# Patient Record
Sex: Male | Born: 1935 | Race: White | Hispanic: No | Marital: Married | Smoking: Former smoker
Health system: Southern US, Community
[De-identification: ages and names within clinical notes are randomized; demographics above are authoritative.]

## PROBLEM LIST (undated history)

## (undated) DIAGNOSIS — I119 Hypertensive heart disease without heart failure: Secondary | ICD-10-CM

## (undated) DIAGNOSIS — I739 Peripheral vascular disease, unspecified: Secondary | ICD-10-CM

## (undated) DIAGNOSIS — I779 Disorder of arteries and arterioles, unspecified: Secondary | ICD-10-CM

## (undated) DIAGNOSIS — R918 Other nonspecific abnormal finding of lung field: Secondary | ICD-10-CM

## (undated) DIAGNOSIS — M549 Dorsalgia, unspecified: Secondary | ICD-10-CM

## (undated) DIAGNOSIS — I2510 Atherosclerotic heart disease of native coronary artery without angina pectoris: Secondary | ICD-10-CM

## (undated) DIAGNOSIS — K219 Gastro-esophageal reflux disease without esophagitis: Secondary | ICD-10-CM

## (undated) DIAGNOSIS — G8929 Other chronic pain: Secondary | ICD-10-CM

## (undated) DIAGNOSIS — Z9289 Personal history of other medical treatment: Secondary | ICD-10-CM

## (undated) DIAGNOSIS — J189 Pneumonia, unspecified organism: Secondary | ICD-10-CM

## (undated) DIAGNOSIS — E785 Hyperlipidemia, unspecified: Secondary | ICD-10-CM

## (undated) DIAGNOSIS — I480 Paroxysmal atrial fibrillation: Secondary | ICD-10-CM

## (undated) DIAGNOSIS — I82409 Acute embolism and thrombosis of unspecified deep veins of unspecified lower extremity: Secondary | ICD-10-CM

## (undated) DIAGNOSIS — G4733 Obstructive sleep apnea (adult) (pediatric): Secondary | ICD-10-CM

## (undated) DIAGNOSIS — L97909 Non-pressure chronic ulcer of unspecified part of unspecified lower leg with unspecified severity: Secondary | ICD-10-CM

## (undated) DIAGNOSIS — F329 Major depressive disorder, single episode, unspecified: Secondary | ICD-10-CM

## (undated) DIAGNOSIS — K2100 Gastro-esophageal reflux disease with esophagitis, without bleeding: Secondary | ICD-10-CM

## (undated) DIAGNOSIS — R51 Headache: Secondary | ICD-10-CM

## (undated) DIAGNOSIS — M1990 Unspecified osteoarthritis, unspecified site: Secondary | ICD-10-CM

## (undated) DIAGNOSIS — D649 Anemia, unspecified: Secondary | ICD-10-CM

## (undated) DIAGNOSIS — F32A Depression, unspecified: Secondary | ICD-10-CM

## (undated) DIAGNOSIS — T7840XA Allergy, unspecified, initial encounter: Secondary | ICD-10-CM

## (undated) DIAGNOSIS — R519 Headache, unspecified: Secondary | ICD-10-CM

## (undated) DIAGNOSIS — I83009 Varicose veins of unspecified lower extremity with ulcer of unspecified site: Secondary | ICD-10-CM

## (undated) HISTORY — DX: Major depressive disorder, single episode, unspecified: F32.9

## (undated) HISTORY — PX: APPENDECTOMY: SHX54

## (undated) HISTORY — DX: Paroxysmal atrial fibrillation: I48.0

## (undated) HISTORY — DX: Varicose veins of unspecified lower extremity with ulcer of unspecified site: I83.009

## (undated) HISTORY — DX: Personal history of other medical treatment: Z92.89

## (undated) HISTORY — DX: Gastro-esophageal reflux disease with esophagitis: K21.0

## (undated) HISTORY — DX: Allergy, unspecified, initial encounter: T78.40XA

## (undated) HISTORY — DX: Gastro-esophageal reflux disease with esophagitis, without bleeding: K21.00

## (undated) HISTORY — DX: Peripheral vascular disease, unspecified: I73.9

## (undated) HISTORY — DX: Depression, unspecified: F32.A

## (undated) HISTORY — DX: Non-pressure chronic ulcer of unspecified part of unspecified lower leg with unspecified severity: L97.909

## (undated) HISTORY — DX: Anemia, unspecified: D64.9

## (undated) HISTORY — DX: Hypertensive heart disease without heart failure: I11.9

## (undated) HISTORY — DX: Hyperlipidemia, unspecified: E78.5

## (undated) HISTORY — PX: TONSILLECTOMY: SUR1361

## (undated) HISTORY — DX: Obstructive sleep apnea (adult) (pediatric): G47.33

## (undated) HISTORY — DX: Atherosclerotic heart disease of native coronary artery without angina pectoris: I25.10

## (undated) HISTORY — DX: Disorder of arteries and arterioles, unspecified: I77.9

## (undated) HISTORY — DX: Unspecified osteoarthritis, unspecified site: M19.90

## (undated) HISTORY — DX: Other nonspecific abnormal finding of lung field: R91.8

## (undated) HISTORY — DX: Gastro-esophageal reflux disease without esophagitis: K21.9

---

## 1979-05-25 HISTORY — PX: SHOULDER OPEN ROTATOR CUFF REPAIR: SHX2407

## 1998-04-05 ENCOUNTER — Inpatient Hospital Stay (HOSPITAL_COMMUNITY): Admission: RE | Admit: 1998-04-05 | Discharge: 1998-04-07 | Payer: Self-pay | Admitting: Nephrology

## 1999-05-05 ENCOUNTER — Ambulatory Visit: Admission: RE | Admit: 1999-05-05 | Discharge: 1999-05-05 | Payer: Self-pay | Admitting: Family Medicine

## 1999-05-05 ENCOUNTER — Encounter: Payer: Self-pay | Admitting: Internal Medicine

## 1999-08-28 ENCOUNTER — Ambulatory Visit: Admission: RE | Admit: 1999-08-28 | Discharge: 1999-08-28 | Payer: Self-pay | Admitting: Internal Medicine

## 2002-09-23 DIAGNOSIS — I739 Peripheral vascular disease, unspecified: Secondary | ICD-10-CM

## 2002-09-23 HISTORY — DX: Peripheral vascular disease, unspecified: I73.9

## 2002-10-11 ENCOUNTER — Encounter: Admission: RE | Admit: 2002-10-11 | Discharge: 2002-10-11 | Payer: Self-pay | Admitting: Orthopedic Surgery

## 2002-10-11 ENCOUNTER — Encounter: Payer: Self-pay | Admitting: Orthopedic Surgery

## 2002-10-12 ENCOUNTER — Ambulatory Visit (HOSPITAL_BASED_OUTPATIENT_CLINIC_OR_DEPARTMENT_OTHER): Admission: RE | Admit: 2002-10-12 | Discharge: 2002-10-13 | Payer: Self-pay | Admitting: Orthopedic Surgery

## 2002-10-12 ENCOUNTER — Encounter (INDEPENDENT_AMBULATORY_CARE_PROVIDER_SITE_OTHER): Payer: Self-pay | Admitting: *Deleted

## 2003-05-16 ENCOUNTER — Ambulatory Visit (HOSPITAL_COMMUNITY): Admission: RE | Admit: 2003-05-16 | Discharge: 2003-05-17 | Payer: Self-pay | Admitting: Cardiovascular Disease

## 2004-06-12 ENCOUNTER — Inpatient Hospital Stay (HOSPITAL_COMMUNITY): Admission: AD | Admit: 2004-06-12 | Discharge: 2004-06-15 | Payer: Self-pay | Admitting: Cardiovascular Disease

## 2004-06-13 HISTORY — PX: CORONARY ANGIOPLASTY WITH STENT PLACEMENT: SHX49

## 2005-01-31 ENCOUNTER — Ambulatory Visit: Payer: Self-pay | Admitting: Internal Medicine

## 2006-06-03 ENCOUNTER — Inpatient Hospital Stay (HOSPITAL_COMMUNITY): Admission: EM | Admit: 2006-06-03 | Discharge: 2006-06-09 | Payer: Self-pay | Admitting: Cardiology

## 2006-06-03 HISTORY — PX: CORONARY ANGIOPLASTY WITH STENT PLACEMENT: SHX49

## 2006-06-06 HISTORY — PX: CORONARY ANGIOPLASTY WITH STENT PLACEMENT: SHX49

## 2006-06-30 ENCOUNTER — Ambulatory Visit: Payer: Self-pay | Admitting: Internal Medicine

## 2006-12-24 ENCOUNTER — Ambulatory Visit: Payer: Self-pay | Admitting: Vascular Surgery

## 2007-01-25 ENCOUNTER — Emergency Department (HOSPITAL_COMMUNITY): Admission: EM | Admit: 2007-01-25 | Discharge: 2007-01-25 | Payer: Self-pay | Admitting: *Deleted

## 2007-07-13 DIAGNOSIS — K210 Gastro-esophageal reflux disease with esophagitis: Secondary | ICD-10-CM

## 2007-07-13 DIAGNOSIS — J449 Chronic obstructive pulmonary disease, unspecified: Secondary | ICD-10-CM | POA: Insufficient documentation

## 2007-07-14 ENCOUNTER — Ambulatory Visit: Payer: Self-pay | Admitting: Vascular Surgery

## 2007-07-15 ENCOUNTER — Ambulatory Visit: Payer: Self-pay | Admitting: Internal Medicine

## 2007-12-29 ENCOUNTER — Ambulatory Visit: Payer: Self-pay | Admitting: Vascular Surgery

## 2008-06-28 ENCOUNTER — Ambulatory Visit: Payer: Self-pay | Admitting: Vascular Surgery

## 2008-08-10 ENCOUNTER — Encounter: Payer: Self-pay | Admitting: Internal Medicine

## 2008-08-22 ENCOUNTER — Encounter: Payer: Self-pay | Admitting: Internal Medicine

## 2008-09-04 ENCOUNTER — Encounter: Payer: Self-pay | Admitting: Internal Medicine

## 2008-09-19 ENCOUNTER — Inpatient Hospital Stay (HOSPITAL_COMMUNITY): Admission: EM | Admit: 2008-09-19 | Discharge: 2008-09-24 | Payer: Self-pay | Admitting: Emergency Medicine

## 2008-09-20 HISTORY — PX: CORONARY ANGIOPLASTY WITH STENT PLACEMENT: SHX49

## 2008-10-01 ENCOUNTER — Inpatient Hospital Stay (HOSPITAL_COMMUNITY): Admission: EM | Admit: 2008-10-01 | Discharge: 2008-10-06 | Payer: Self-pay | Admitting: Emergency Medicine

## 2008-10-03 HISTORY — PX: CARDIAC CATHETERIZATION: SHX172

## 2009-07-21 ENCOUNTER — Encounter: Payer: Self-pay | Admitting: Internal Medicine

## 2009-08-24 ENCOUNTER — Ambulatory Visit: Payer: Self-pay | Admitting: Internal Medicine

## 2009-08-24 DIAGNOSIS — J984 Other disorders of lung: Secondary | ICD-10-CM | POA: Insufficient documentation

## 2009-08-24 DIAGNOSIS — G4733 Obstructive sleep apnea (adult) (pediatric): Secondary | ICD-10-CM

## 2009-09-04 ENCOUNTER — Telehealth: Payer: Self-pay | Admitting: Internal Medicine

## 2010-05-07 ENCOUNTER — Inpatient Hospital Stay (HOSPITAL_COMMUNITY): Admission: AD | Admit: 2010-05-07 | Discharge: 2010-05-08 | Payer: Self-pay | Admitting: Cardiovascular Disease

## 2010-08-28 ENCOUNTER — Encounter (HOSPITAL_BASED_OUTPATIENT_CLINIC_OR_DEPARTMENT_OTHER)
Admission: RE | Admit: 2010-08-28 | Discharge: 2010-10-23 | Payer: Self-pay | Source: Home / Self Care | Attending: Internal Medicine | Admitting: Internal Medicine

## 2010-09-13 ENCOUNTER — Ambulatory Visit: Payer: Self-pay | Admitting: Internal Medicine

## 2010-09-21 ENCOUNTER — Telehealth (INDEPENDENT_AMBULATORY_CARE_PROVIDER_SITE_OTHER): Payer: Self-pay | Admitting: *Deleted

## 2010-10-18 ENCOUNTER — Encounter: Payer: Self-pay | Admitting: Internal Medicine

## 2010-10-25 NOTE — Assessment & Plan Note (Signed)
Summary: 1 yr follow-up//jrc   Primary Provider/Referring Provider:  Lucila Maine  CC:  1 year f/u appt.  wears cpap machine every night.  c/o waking up in the middle of night and has trouble falling back asleep.  also c/o dry throat in the am. denies sob. Gavin [name]  History of Present Illness: August 24, 2009- OSA, COPD, GERD  ........................................Gavin Kitchenwife here OSA- [facility]- he thinks pressure is on 9, our record says 10. He has nasal pillows mask, new machine. He and wife think he sleeps ok with little snore-through. He naps aftrer cardiac rehab without interfering somnlence or difficulty sleeping at night. Has lost some weight Had flu shot. He denies significant change overall in his breathing. This is reinforced by his rehab exercise. Voice has gotten softer. Last CXR 2 mos ago said"ok".He tells me now he is being followed with CXR at [facility] for a lung nodule.  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  wears cpap machine every night.  c/o waking up in the middle of night and has trouble falling back asleep.  also c/o dry throat in the am. denies sob.  CPAP is at 9. This mask allows some lip flutter with nasal pillows mask. He has been using it every night and admits he sleeps better with it. Original NPSG in 2000 had shown severe OSA with RDI 65/hr. Lung nodules f/u w/ CT at [facility]- stable nodules w/ 1 more annual f/u recommended.      Preventive Screening-Counseling & Management  Alcohol-Tobacco     Smoking Status: quit  Problems Prior to Update: 1)  Obstructive Sleep Apnea  (ICD-327.23) 2)  Lung Nodule  (ICD-518.89) 3)  COPD  (ICD-496) 4)  Cad  (ICD-414.00) 5)  Esophagitis, Reflux  (ICD-530.11)  Medications Prior to Update: 1)  Prilosec 20 Mg  Cpdr (Omeprazole) .... One By Mouth Once Daily 2)  Toprol Xl 100 Mg  Tb24 (Metoprolol Succinate) .... One Q Am 3)  Vasotec 20 Mg  Tabs (Enalapril Maleate) .... One Qd 4)   Lipitor 40 Mg  Tabs (Atorvastatin Calcium) .... One Once Daily 5)  Nasonex 50 Mcg/act  Susp (Mometasone Furoate) 6)  Adult Aspirin Ec Low Strength 81 Mg  Tbec (Aspirin) 7)  Flomax 0.4 Mg  Cp24 (Tamsulosin Hcl) 8)  Spironolactone 25 Mg  Tabs (Spironolactone) .... 1/2 Qd 9)  Cpap  9 Cwp Am Home Pt 10)  Coumadin 5 Mg Tabs (Warfarin Sodium) .... Take As Directed 11)  Imdur 30 Mg Xr24h-Tab (Isosorbide Mononitrate) .... Take 2 By Mouth Once Daily 12)  Ocuvite Preservision  Tabs (Multiple Vitamins-Minerals) .... Take 2 By Mouth Once Daily  Current Medications (verified): 1)  Protonix 40 Mg Tbec (Pantoprazole Sodium) .... Take 1 Tablet By Mouth Once A Day 2)  Toprol Xl 100 Mg  Tb24 (Metoprolol Succinate) .... Take 1 1/2 Tabs By Mouth Daily 3)  Vasotec 20 Mg  Tabs (Enalapril Maleate) .... One Qd 4)  Lipitor 40 Mg  Tabs (Atorvastatin Calcium) .... One Once Daily 5)  Nasonex 50 Mcg/act  Susp (Mometasone Furoate) .... At Bedtime 6)  Adult Aspirin Ec Low Strength 81 Mg  Tbec (Aspirin) 7)  Cpap  9 Cwp Am Home Pt 8)  Coumadin 5 Mg Tabs (Warfarin Sodium) .... Take As Directed 9)  Imdur 30 Mg Xr24h-Tab (Isosorbide Mononitrate) .... Take 1 Tablet By Mouth Two Times A Day 10)  Ocuvite Preservision  Tabs (Multiple Vitamins-Minerals) .... Take 2  By Mouth Once Daily 11)  Clonidine Hcl 0.1 Mg Tabs (Clonidine Hcl) .... Take 1 Tablet By Mouth Two Times A Day 12)  Furosemide 40 Mg Tabs (Furosemide) .... Take 1 Tablet By Mouth Once A Day 13)  Norvasc 5 Mg Tabs (Amlodipine Besylate) .... Take 1 Tablet By Mouth Once A Day 14)  Triamcinolone Acetonide 0.5 % Crea (Triamcinolone Acetonide) .... Use Two Times A Day 15)  Flexeril 10 Mg Tabs (Cyclobenzaprine Hcl) .... As Needed 16)  Clorazepate Dipotassium 7.5 Mg Tabs (Clorazepate Dipotassium) .... As Needed 17)  Benefiber  Powd (Wheat Dextrin) .[name]. 1 Tsp Two Times A Day 18)  Cetaphil  Crea (Emollient) .... Daily 19)  Claritin 10 Mg Tabs (Loratadine) .... Take 1 Tablet By  Mouth Once A Day 20)  Ibuprofen 400 Mg Tabs (Ibuprofen) .... Take 1 Tablet By Mouth Two Times A Day 21)  Miralax  Powd (Polyethylene Glycol 3350) .... Once Daily 22)  Botox Injection .... Every 3 Months For Migraines 23)  Metolazone 2.5 Mg Tabs (Metolazone) .... 1/2 Tab By Mouth Twice A Week 24)  Klor-Con 10 10 Meq Cr-Tabs (Potassium Chloride) .... Take 1 Tab Along With The Metolazone 25)  Cephalexin 500 Mg Caps (Cephalexin) .... Take 1 Tablet By Mouth Three Times A Day  Allergies (verified): 1)  ! Cipro 2)  ! Codeine 3)  ! * Micardis 4)  ! Sulfa  Past History:  Past Surgical History: Last updated: 12-Sep-2009 Right rotagtor cuff Appendectomy Tonsillectomy Cardiac stents x 6  Family History: Last updated: 2009-09-12 Father- died MI Mother- died CHF  Social History: Last updated: 09-12-2009 Married Patient states former smoker. 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    Vital Signs:  Patient profile:   77 year old male Height:      67 inches Weight:      211.50 pounds BMI:     33.25 O2 Sat:      98 % on Room air Pulse rate:   57 / minute BP sitting:   140 / 70  (left arm) Cuff size:   regular  Vitals Entered By: Arman Filter LPN (September 13, 2010 2:22 PM)  O2 Flow:  Room air CC: 1 year f/u appt.  wears cpap machine every night.  c/o waking up in the middle of night and has trouble  falling back asleep.  also c/o dry throat in the am. denies sob.  Comments Medications reviewed with patient Arman Filter LPN  September 13, 2010 2:38 PM    Physical Exam  Additional Exam:  General: A/Ox3; pleasant and cooperative, NAD, well appearing SKIN: no rash, lesions NODES: no lymphadenopathy HEENT: [place]/AT, EOM- WNL, Conjuctivae- clear, PERRLA, TM-hearing aids, Nose- clear, Throat- clear and wnl,  Mallampati  III NECK: Supple w/ fair ROM, JVD- none, normal carotid impulses w/o bruits Thyroid- normal to palpation CHEST: Clear to P&A HEART: RRR, no m/g/r heard ABDOMEN: Soft and nl; NWG:NFAO, nl pulses, no edema  NEURO: Grossly intact to observation  Impression & Recommendations:  Problem # 1:  OBSTRUCTIVE SLEEP APNEA (ICD-327.23)  We will try reducing his CPAP to 8, let him increase his humidifier setting. For the waking after sleep onset he can try sonata.   Problem # 2:  LUNG NODULE (ICD-518.89) Discussed result of CT done in [place].  Dr Lorin Picket is following but patient says  "nothing there". He is probably right that these are benign nodules, but I will let Dr Lorin Picket discuss follow-up recommendations from Radiology again with Mr Kinslow.   Medications Added to Medication List This Visit: 1)  Protonix 40 Mg Tbec (Pantoprazole sodium) .... Take 1 tablet by mouth once a day 2)  Toprol Xl 100 Mg Tb24 (Metoprolol succinate) .... Take 1 1/2 tabs by mouth daily 3)  Nasonex 50 Mcg/act Susp (Mometasone furoate) .... At bedtime 4)  Cpap 8 Cwp Am Home Pt  5)  Imdur 30 Mg Xr24h-tab (Isosorbide mononitrate) .... Take 1 tablet by mouth two times a day 6)  Clonidine Hcl 0.1 Mg Tabs (Clonidine hcl) .... Take 1 tablet by mouth two times a day 7)  Furosemide 40 Mg Tabs (Furosemide) .... Take 1 tablet by mouth once a day 8)  Norvasc 5 Mg Tabs (Amlodipine besylate) .... Take 1 tablet by mouth once a day 9)  Triamcinolone Acetonide 0.5 % Crea (Triamcinolone acetonide) .... Use two times  a day 10)  Flexeril 10 Mg Tabs (Cyclobenzaprine hcl) .... As needed 11)  Clorazepate Dipotassium 7.5 Mg Tabs (Clorazepate dipotassium) .... As needed 12)  Benefiber Powd (Wheat dextrin) .[name]. 1 tsp two times a day 13)  Cetaphil Crea (Emollient) .... Daily 14)  Claritin 10 Mg Tabs (Loratadine) .... Take 1 tablet by mouth once a day 15)  Ibuprofen 400 Mg Tabs (Ibuprofen) .... Take 1 tablet by mouth two times a day 16)  Miralax Powd (Polyethylene glycol 3350) .... Once daily 17)  Botox Injection  .... Every 3 months for migraines 18)  Metolazone 2.5 Mg Tabs (Metolazone) .... 1/2 tab by mouth twice a week 19)  Klor-con 10 10 Meq Cr-tabs (Potassium chloride) .... Take 1 tab along with the metolazone 20)  Cephalexin 500 Mg Caps (Cephalexin) .... Take 1 tablet by mouth three times a day  Other Orders: Est. Patient Level III (29562) DME Referral (DME)  Patient Instructions: 1)  Please schedule a follow-up appointment in 1 year. 2)  We will contact [facility] to reduce CPAP to 8. 3)  Try turning the heater/ humidifier up to increase moisture in the air and reduce your dry  mouth.   Immunization History:  Influenza Immunization History:    Influenza:  historical (06/23/2010)

## 2010-10-25 NOTE — Progress Notes (Signed)
Summary: Prescription and order for CPAP  Phone Note Call from Patient Call back at Home Phone [phone] Call back at [phone]   Caller: Patient Summary of Call: Patient is waiting for prescription for sleep to be sent to [facility] on [address] in [place], and order to reduce CPAP to [facility].  Please call patient. Initial call taken by: Leonette Monarch,  September 21, 2010 12:46 PM  Follow-up for Phone Call        Spoke with the pt and he states he has not heard anything from [facility], so I called ans they do have the order for pressure change and they will contact the pt today.  Pt also states that CY discussed sending in a RX for sonata at the last OV but the pharmacy does not have this. Please advise. Carron Curie CMA  September 21, 2010 1:14 PM   Additional Follow-up for Phone Call Additional follow up Details #1::        I put script for zalpelon/ generic sonata on medlist Please send. Additional Follow-up by: Waymon Budge MD,  September 21, 2010 1:32 PM    Additional Follow-up for Phone Call Additional follow up Details #2::    sonata has been called into the pts pharmacy---[facility] for pt to make him aware and if he had any questions. Randell Loop CMA  September 21, 2010 2:41 PM   Spoke with pt and notified rx for sonata was called in, he is aware and has already started med, and states that it helps alot.  Follow-up by: Vernie Murders,  September 25, 2010 5:13 PM  New/Updated Medications: SONATA 10 MG CAPS (ZALEPLON) 1 for sleep if needed Prescriptions: SONATA 10 MG CAPS (ZALEPLON) 1 for sleep if needed  #30 x 5   Entered by:   Waymon Budge MD   Authorized by:   Pulmonary Triage   Signed by:   Waymon Budge MD on 09/21/2010   Method used:   Historical   RxID:   [id number]

## 2010-10-30 ENCOUNTER — Encounter (HOSPITAL_BASED_OUTPATIENT_CLINIC_OR_DEPARTMENT_OTHER): Payer: Medicare Other | Attending: Internal Medicine

## 2010-10-30 DIAGNOSIS — Z7901 Long term (current) use of anticoagulants: Secondary | ICD-10-CM | POA: Insufficient documentation

## 2010-10-30 DIAGNOSIS — Z7982 Long term (current) use of aspirin: Secondary | ICD-10-CM | POA: Insufficient documentation

## 2010-10-30 DIAGNOSIS — L97809 Non-pressure chronic ulcer of other part of unspecified lower leg with unspecified severity: Secondary | ICD-10-CM | POA: Insufficient documentation

## 2010-10-30 DIAGNOSIS — Z8673 Personal history of transient ischemic attack (TIA), and cerebral infarction without residual deficits: Secondary | ICD-10-CM | POA: Insufficient documentation

## 2010-10-30 DIAGNOSIS — I2510 Atherosclerotic heart disease of native coronary artery without angina pectoris: Secondary | ICD-10-CM | POA: Insufficient documentation

## 2010-10-30 DIAGNOSIS — G4733 Obstructive sleep apnea (adult) (pediatric): Secondary | ICD-10-CM | POA: Insufficient documentation

## 2010-10-30 DIAGNOSIS — I739 Peripheral vascular disease, unspecified: Secondary | ICD-10-CM | POA: Insufficient documentation

## 2010-10-30 DIAGNOSIS — I70299 Other atherosclerosis of native arteries of extremities, unspecified extremity: Secondary | ICD-10-CM | POA: Insufficient documentation

## 2010-10-30 DIAGNOSIS — I252 Old myocardial infarction: Secondary | ICD-10-CM | POA: Insufficient documentation

## 2010-10-30 DIAGNOSIS — I872 Venous insufficiency (chronic) (peripheral): Secondary | ICD-10-CM | POA: Insufficient documentation

## 2010-11-08 NOTE — Letter (Signed)
Summary: CMN for CPAP Supplies/[facility]  CMN for CPAP Supplies/[facility]   Imported By: Sherian Rein 10/30/2010 09:21:47  _____________________________________________________________________  External Attachment:    Type:   Image     Comment:   External Document

## 2010-11-27 ENCOUNTER — Encounter (HOSPITAL_BASED_OUTPATIENT_CLINIC_OR_DEPARTMENT_OTHER): Payer: Medicare Other | Attending: Internal Medicine

## 2010-11-27 DIAGNOSIS — Z7982 Long term (current) use of aspirin: Secondary | ICD-10-CM | POA: Insufficient documentation

## 2010-11-27 DIAGNOSIS — Z8673 Personal history of transient ischemic attack (TIA), and cerebral infarction without residual deficits: Secondary | ICD-10-CM | POA: Insufficient documentation

## 2010-11-27 DIAGNOSIS — I70299 Other atherosclerosis of native arteries of extremities, unspecified extremity: Secondary | ICD-10-CM | POA: Insufficient documentation

## 2010-11-27 DIAGNOSIS — I739 Peripheral vascular disease, unspecified: Secondary | ICD-10-CM | POA: Insufficient documentation

## 2010-11-27 DIAGNOSIS — G4733 Obstructive sleep apnea (adult) (pediatric): Secondary | ICD-10-CM | POA: Insufficient documentation

## 2010-11-27 DIAGNOSIS — Z7901 Long term (current) use of anticoagulants: Secondary | ICD-10-CM | POA: Insufficient documentation

## 2010-11-27 DIAGNOSIS — I2510 Atherosclerotic heart disease of native coronary artery without angina pectoris: Secondary | ICD-10-CM | POA: Insufficient documentation

## 2010-11-27 DIAGNOSIS — I252 Old myocardial infarction: Secondary | ICD-10-CM | POA: Insufficient documentation

## 2010-11-27 DIAGNOSIS — L97809 Non-pressure chronic ulcer of other part of unspecified lower leg with unspecified severity: Secondary | ICD-10-CM | POA: Insufficient documentation

## 2010-11-27 DIAGNOSIS — I872 Venous insufficiency (chronic) (peripheral): Secondary | ICD-10-CM | POA: Insufficient documentation

## 2010-12-06 LAB — BASIC METABOLIC PANEL
CO2: 26 mEq/L (ref 19–32)
Calcium: 8.5 mg/dL (ref 8.4–10.5)
Creatinine, Ser: 1.02 mg/dL (ref 0.4–1.5)
GFR calc Af Amer: >60 mL/min (ref >60)
Sodium: 142 mEq/L (ref 135–145)

## 2010-12-06 LAB — URINALYSIS, ROUTINE W REFLEX MICROSCOPIC
Bilirubin Urine: NEGATIVE
Glucose, UA: NEGATIVE mg/dL
Hgb urine dipstick: NEGATIVE
Protein, ur: NEGATIVE mg/dL
Specific Gravity, Urine: 1.01 (ref 1.005–1.030)
Urobilinogen, UA: 0.2 mg/dL (ref 0.0–1.0)

## 2010-12-06 LAB — CARDIAC PANEL(CRET KIN+CKTOT+MB+TROPI)
CK, MB: 3 ng/mL (ref 0.3–4.0)
Relative Index: 3 — ABNORMAL HIGH (ref 0.0–2.5)
Total CK: 107 U/L (ref 7–232)
Total CK: 115 U/L (ref 7–232)
Troponin I: 0.01 ng/mL (ref 0.00–0.06)

## 2010-12-06 LAB — PROTIME-INR
INR: 2.49 — ABNORMAL HIGH (ref 0.00–1.49)
Prothrombin Time: 27 seconds — ABNORMAL HIGH (ref 11.6–15.2)

## 2010-12-06 LAB — BRAIN NATRIURETIC PEPTIDE: Pro B Natriuretic peptide (BNP): 271 pg/mL — ABNORMAL HIGH (ref 0.0–100.0)

## 2010-12-18 ENCOUNTER — Other Ambulatory Visit (HOSPITAL_BASED_OUTPATIENT_CLINIC_OR_DEPARTMENT_OTHER): Payer: Self-pay | Admitting: Internal Medicine

## 2010-12-18 ENCOUNTER — Ambulatory Visit (HOSPITAL_COMMUNITY)
Admission: RE | Admit: 2010-12-18 | Discharge: 2010-12-18 | Disposition: A | Discharge status: Home / Self Care | Payer: Medicare Other | Source: Ambulatory Visit | Attending: Internal Medicine | Admitting: Internal Medicine

## 2010-12-18 DIAGNOSIS — M79671 Pain in right foot: Secondary | ICD-10-CM

## 2010-12-18 DIAGNOSIS — M19079 Primary osteoarthritis, unspecified ankle and foot: Secondary | ICD-10-CM | POA: Insufficient documentation

## 2010-12-18 DIAGNOSIS — M79609 Pain in unspecified limb: Secondary | ICD-10-CM | POA: Insufficient documentation

## 2010-12-25 ENCOUNTER — Encounter (HOSPITAL_BASED_OUTPATIENT_CLINIC_OR_DEPARTMENT_OTHER): Payer: Medicare Other | Attending: Internal Medicine

## 2010-12-25 DIAGNOSIS — I70299 Other atherosclerosis of native arteries of extremities, unspecified extremity: Secondary | ICD-10-CM | POA: Insufficient documentation

## 2010-12-25 DIAGNOSIS — Z7901 Long term (current) use of anticoagulants: Secondary | ICD-10-CM | POA: Insufficient documentation

## 2010-12-25 DIAGNOSIS — Z8673 Personal history of transient ischemic attack (TIA), and cerebral infarction without residual deficits: Secondary | ICD-10-CM | POA: Insufficient documentation

## 2010-12-25 DIAGNOSIS — I252 Old myocardial infarction: Secondary | ICD-10-CM | POA: Insufficient documentation

## 2010-12-25 DIAGNOSIS — Z7982 Long term (current) use of aspirin: Secondary | ICD-10-CM | POA: Insufficient documentation

## 2010-12-25 DIAGNOSIS — I872 Venous insufficiency (chronic) (peripheral): Secondary | ICD-10-CM | POA: Insufficient documentation

## 2010-12-25 DIAGNOSIS — G4733 Obstructive sleep apnea (adult) (pediatric): Secondary | ICD-10-CM | POA: Insufficient documentation

## 2010-12-25 DIAGNOSIS — I739 Peripheral vascular disease, unspecified: Secondary | ICD-10-CM | POA: Insufficient documentation

## 2010-12-25 DIAGNOSIS — I2510 Atherosclerotic heart disease of native coronary artery without angina pectoris: Secondary | ICD-10-CM | POA: Insufficient documentation

## 2010-12-25 DIAGNOSIS — L97809 Non-pressure chronic ulcer of other part of unspecified lower leg with unspecified severity: Secondary | ICD-10-CM | POA: Insufficient documentation

## 2011-01-07 LAB — CBC
HCT: 39.5 % (ref 39.0–52.0)
HCT: 42 % (ref 39.0–52.0)
Hemoglobin: 12.1 g/dL — ABNORMAL LOW (ref 13.0–17.0)
Hemoglobin: 12.4 g/dL — ABNORMAL LOW (ref 13.0–17.0)
Hemoglobin: 13.6 g/dL (ref 13.0–17.0)
MCHC: 32.7 g/dL (ref 30.0–36.0)
MCHC: 33 g/dL (ref 30.0–36.0)
MCHC: 33.2 g/dL (ref 30.0–36.0)
MCV: 100.8 fL — ABNORMAL HIGH (ref 78.0–100.0)
MCV: 100.9 fL — ABNORMAL HIGH (ref 78.0–100.0)
MCV: 101.3 fL — ABNORMAL HIGH (ref 78.0–100.0)
MCV: 99.5 fL (ref 78.0–100.0)
Platelets: 311 10*3/uL (ref 150–400)
Platelets: 321 10*3/uL (ref 150–400)
Platelets: 337 10*3/uL (ref 150–400)
Platelets: 382 10*3/uL (ref 150–400)
RBC: 3.62 MIL/uL — ABNORMAL LOW (ref 4.22–5.81)
RBC: 3.73 MIL/uL — ABNORMAL LOW (ref 4.22–5.81)
RBC: 4.16 MIL/uL — ABNORMAL LOW (ref 4.22–5.81)
RDW: 13.4 % (ref 11.5–15.5)
RDW: 13.5 % (ref 11.5–15.5)
RDW: 13.8 % (ref 11.5–15.5)
WBC: 6.2 10*3/uL (ref 4.0–10.5)
WBC: 6.7 10*3/uL (ref 4.0–10.5)
WBC: 7.2 10*3/uL (ref 4.0–10.5)
WBC: 7.3 10*3/uL (ref 4.0–10.5)
WBC: 7.7 10*3/uL (ref 4.0–10.5)
WBC: 8.1 10*3/uL (ref 4.0–10.5)

## 2011-01-07 LAB — PROTIME-INR
INR: 1.2 (ref 0.00–1.49)
INR: 1.2 (ref 0.00–1.49)
INR: 1.3 (ref 0.00–1.49)
INR: 1.4 (ref 0.00–1.49)
INR: 1.6 — ABNORMAL HIGH (ref 0.00–1.49)
INR: 1.6 — ABNORMAL HIGH (ref 0.00–1.49)
INR: 1.9 — ABNORMAL HIGH (ref 0.00–1.49)
INR: 2.3 — ABNORMAL HIGH (ref 0.00–1.49)
Prothrombin Time: 15.2 seconds (ref 11.6–15.2)
Prothrombin Time: 15.4 seconds — ABNORMAL HIGH (ref 11.6–15.2)
Prothrombin Time: 15.4 seconds — ABNORMAL HIGH (ref 11.6–15.2)
Prothrombin Time: 19.4 seconds — ABNORMAL HIGH (ref 11.6–15.2)
Prothrombin Time: 20.2 seconds — ABNORMAL HIGH (ref 11.6–15.2)
Prothrombin Time: 26.5 seconds — ABNORMAL HIGH (ref 11.6–15.2)

## 2011-01-07 LAB — POCT CARDIAC MARKERS
CKMB, poc: 1.5 ng/mL (ref 1.0–8.0)
Myoglobin, poc: 95 ng/mL (ref 12–200)
Troponin i, poc: 0.06 ng/mL (ref 0.00–0.09)

## 2011-01-07 LAB — POCT I-STAT, CHEM 8
BUN: 19 mg/dL (ref 6–23)
Calcium, Ion: 1.02 mmol/L — ABNORMAL LOW (ref 1.12–1.32)
Chloride: 108 mEq/L (ref 96–112)
HCT: 44 % (ref 39.0–52.0)
Potassium: 5.6 mEq/L — ABNORMAL HIGH (ref 3.5–5.1)
Sodium: 140 mEq/L (ref 135–145)

## 2011-01-07 LAB — COMPREHENSIVE METABOLIC PANEL
AST: 23 U/L (ref 0–37)
Albumin: 2.8 g/dL — ABNORMAL LOW (ref 3.5–5.2)
Calcium: 8.7 mg/dL (ref 8.4–10.5)
Creatinine, Ser: 1.47 mg/dL (ref 0.4–1.5)
GFR calc Af Amer: 56 mL/min — ABNORMAL LOW (ref >60)
Sodium: 143 mEq/L (ref 135–145)

## 2011-01-07 LAB — BASIC METABOLIC PANEL
BUN: 13 mg/dL (ref 6–23)
BUN: 17 mg/dL (ref 6–23)
CO2: 27 mEq/L (ref 19–32)
CO2: 29 mEq/L (ref 19–32)
Calcium: 8.3 mg/dL — ABNORMAL LOW (ref 8.4–10.5)
Calcium: 8.6 mg/dL (ref 8.4–10.5)
Calcium: 8.8 mg/dL (ref 8.4–10.5)
Chloride: 105 mEq/L (ref 96–112)
Chloride: 111 mEq/L (ref 96–112)
Creatinine, Ser: 1.11 mg/dL (ref 0.4–1.5)
Creatinine, Ser: 1.31 mg/dL (ref 0.4–1.5)
Creatinine, Ser: 1.36 mg/dL (ref 0.4–1.5)
GFR calc Af Amer: >60 mL/min (ref >60)
GFR calc Af Amer: >60 mL/min (ref >60)
GFR calc non Af Amer: 51 mL/min — ABNORMAL LOW (ref >60)
GFR calc non Af Amer: >60 mL/min (ref >60)
Glucose, Bld: 100 mg/dL — ABNORMAL HIGH (ref 70–99)
Glucose, Bld: 91 mg/dL (ref 70–99)
Potassium: 4.2 mEq/L (ref 3.5–5.1)
Sodium: 137 mEq/L (ref 135–145)
Sodium: 138 mEq/L (ref 135–145)

## 2011-01-07 LAB — TSH: TSH: 2.127 u[IU]/mL (ref 0.350–4.500)

## 2011-01-07 LAB — CK TOTAL AND CKMB (NOT AT ARMC): CK, MB: 2.5 ng/mL (ref 0.3–4.0)

## 2011-01-07 LAB — CARDIAC PANEL(CRET KIN+CKTOT+MB+TROPI)
CK, MB: 2.2 ng/mL (ref 0.3–4.0)
Relative Index: INVALID (ref 0.0–2.5)
Relative Index: INVALID (ref 0.0–2.5)
Total CK: 40 U/L (ref 7–232)
Total CK: 48 U/L (ref 7–232)
Troponin I: 0.17 ng/mL — ABNORMAL HIGH (ref 0.00–0.06)
Troponin I: 0.26 ng/mL — ABNORMAL HIGH (ref 0.00–0.06)

## 2011-01-07 LAB — HEMOGLOBIN A1C: Mean Plasma Glucose: 111 mg/dL

## 2011-01-07 LAB — GLUCOSE, CAPILLARY: Glucose-Capillary: 81 mg/dL (ref 70–99)

## 2011-01-07 LAB — APTT: aPTT: 41 seconds — ABNORMAL HIGH (ref 24–37)

## 2011-01-07 LAB — BRAIN NATRIURETIC PEPTIDE: Pro B Natriuretic peptide (BNP): 39 pg/mL (ref 0.0–100.0)

## 2011-01-23 ENCOUNTER — Encounter (HOSPITAL_BASED_OUTPATIENT_CLINIC_OR_DEPARTMENT_OTHER): Payer: Medicare Other | Attending: Plastic Surgery

## 2011-01-23 DIAGNOSIS — Z79899 Other long term (current) drug therapy: Secondary | ICD-10-CM | POA: Insufficient documentation

## 2011-01-23 DIAGNOSIS — L97509 Non-pressure chronic ulcer of other part of unspecified foot with unspecified severity: Secondary | ICD-10-CM | POA: Insufficient documentation

## 2011-01-23 DIAGNOSIS — Z8673 Personal history of transient ischemic attack (TIA), and cerebral infarction without residual deficits: Secondary | ICD-10-CM | POA: Insufficient documentation

## 2011-01-23 DIAGNOSIS — L97209 Non-pressure chronic ulcer of unspecified calf with unspecified severity: Secondary | ICD-10-CM | POA: Insufficient documentation

## 2011-01-23 DIAGNOSIS — I10 Essential (primary) hypertension: Secondary | ICD-10-CM | POA: Insufficient documentation

## 2011-01-23 DIAGNOSIS — Z7901 Long term (current) use of anticoagulants: Secondary | ICD-10-CM | POA: Insufficient documentation

## 2011-01-23 DIAGNOSIS — I739 Peripheral vascular disease, unspecified: Secondary | ICD-10-CM | POA: Insufficient documentation

## 2011-01-23 DIAGNOSIS — Z7982 Long term (current) use of aspirin: Secondary | ICD-10-CM | POA: Insufficient documentation

## 2011-01-23 DIAGNOSIS — I2510 Atherosclerotic heart disease of native coronary artery without angina pectoris: Secondary | ICD-10-CM | POA: Insufficient documentation

## 2011-02-05 NOTE — Assessment & Plan Note (Signed)
[facility]                             PULMONARY OFFICE NOTE   DAVINE, COBA                       MRN:          [id number]  DATE:07/15/2007                            DOB:          1929/08/27    PROBLEM LIST:  1. Obstructive sleep apnea.  2. Esophageal reflux.  3. Coronary disease/myocardial infarction.   HISTORY:  One year followup.  His wife does not notice any snoring or  apnea on his current CPAP setting of 10.  They do notice some oral  venting and flutter.  He does doze off if quiet during the day without  deliberate naps.  It does not seem to bother him any, and he is not  aware of any sleep disturbance.  He thinks he is getting enough  nighttime sleep.   MEDICATIONS:  1. Claritin 5 mg.  2. Plavix 75 mg.  3. Prilosec 20 mg.  4. Spironolactone 1/2 x 25 mg.  5. Toprol XL 100 mg.  6. Vasotec 20 mg.  7. Coumadin.  8. Flomax 0.4 mg.  9. Lipitor 40 mg.  10.Nasonex nasal spray.  11.Metronidazole 0.75% topical.  12.Cyclobenzaprine 10 mg t.i.d. p.r.n. for headache.  13.Clorazepate 7.5 mg b.i.d. p.r.n.  14.Clonidine 0.1 mg.  15.Furosemide 40 mg.   All used as p.r.n. medicines with drug sensitivities to CODEINE, SULFA,  and MICARDIS.  Drug allergy to CIPRO.   OBJECTIVE:  Weight 222 pounds, BP 146/74, pulse 68, room air saturation  99%.  Calm.  He seems comfortably awake and alert.  No tremor.  Neurologic is unremarkable to observation.  Breathing unlabored.  Nasal airways not obviously obstructed.   IMPRESSION:  Obstructive sleep apnea.  There is a daytime sleepiness  component that may be better left alone.  I would like to make sure we  have got optimum continuous positive airway pressure control.   PLAN:  We are going to try reducing continuous positive airway pressure  to 9 CWP through [facility].  I have discussed effects of  under and over treating with continuous positive airway pressure and  some treatment  options.  He and his wife, I think, are well informed and  can report appropriately, so I will reschedule return in 1 year but  earlier p.r.n.     Clinton D. Maple Hudson, MD, Tonny Bollman, FACP  Electronically Signed    CDY/MedQ  DD: 07/15/2007  DT: 07/16/2007  Job #: [id number]   cc:   Lucila Maine, MD  Nanetta Batty, M.D.

## 2011-02-05 NOTE — Procedures (Signed)
CAROTID DUPLEX EXAM   INDICATION:  Follow-up evaluation of known carotid artery disease.   HISTORY:  Diabetes:  No.  Cardiac:  MI in 2007.  History of atrial fibrillation.  Hypertension:  Yes.  Smoking:  Previous smoker.  Previous Surgery:  No.  CV History:  On 07/14/07, carotid duplex revealed 60-79% ICA stenosis  bilaterally.  Amaurosis Fugax No, Paresthesias No, Hemiparesis No                                       RIGHT             LEFT  Brachial systolic pressure:         126               118  Brachial Doppler waveforms:         Triphasic         Triphasic  Vertebral direction of flow:        Antegrade         Antegrade  DUPLEX VELOCITIES (cm/sec)  CCA peak systolic                   61                60  ECA peak systolic                   179               84  ICA peak systolic                   186               272  ICA end diastolic                   41                66  PLAQUE MORPHOLOGY:                  Calcified         Calcified  PLAQUE AMOUNT:                      Moderate          Moderate  PLAQUE LOCATION:                    Proximal ICA/ECA  Proximal ICA   IMPRESSION:  1. 40-59% right internal carotid artery stenosis (high end of range).  2. 60-79% left internal carotid artery stenosis.  3. No significant change from previous study bilaterally.   ___________________________________________  Di Kindle. Edilia Bo, M.D.   MC/MEDQ  D:  12/29/2007  T:  12/29/2007  Job:  [id number]

## 2011-02-05 NOTE — Discharge Summary (Signed)
NAME:  Gavin [name], Gavin [name] NO.:  [id number]   MEDICAL RECORD NO.:  [id number]          PATIENT TYPE:  INP   LOCATION:  2010                         FACILITY:  [facility]   PHYSICIAN:  Nicki Guadalajara, M.D.     DATE OF BIRTH:  March 01, 1929   DATE OF ADMISSION:  09/19/2008  DATE OF DISCHARGE:  09/24/2008                               DISCHARGE SUMMARY   ADDENDUM  Please refer to the discharge summary dictated on September 22, 2008.  [name] was kept an extra 48 hours because of some back pain.  We did  send him for a CT scan to make sure he did not have a retroperitoneal  bleed and this was in fact negative.  Dr. Tresa Endo used this time to adjust  his medications.  We added low-dose Imdur and increased his beta-  blocker.  His INR at discharge is 1.4.  We feel he can be discharged on  September 24, 2008.  He will follow up with Dr. Allyson Sabal and [facility]  [facility] as previously directed.      Gavin [name], P.A.    ______________________________  Nicki Guadalajara, M.D.    Gavin [name]  D:  09/24/2008  T:  09/24/2008  Job:  [id number]   cc:   Thereasa Solo. Little, M.D.

## 2011-02-05 NOTE — Procedures (Signed)
CAROTID DUPLEX EXAM   INDICATION:  Follow up of known coronary artery disease.   HISTORY:  Diabetes:  No.  Cardiac:  MI in 2007 and atrial fibrillation.  Hypertension:  Yes.  Smoking:  No  Previous Surgery:  No.                                       RIGHT             LEFT  Brachial systolic pressure:         126               110  Brachial Doppler waveforms:         Biphasic.         Biphasic.  Vertebral direction of flow:        Antegrade.        Antegrade.  DUPLEX VELOCITIES (cm/sec)  CCA peak systolic                   86                90  ECA peak systolic                   236               114  ICA peak systolic                   240               292  ICA end diastolic                   50                71  PLAQUE MORPHOLOGY:                  Heterogenous.     Heterogenous.  PLAQUE AMOUNT:                      Moderate.         Moderate to  severe.  PLAQUE LOCATION:                    ICA/ECA           ICA/ECA   IMPRESSION:  60-79% stenosis noted in bilateral internal carotid  arteries. Antegrade bilateral vertebral arteries.   ___________________________________________  Di Kindle. Edilia Bo, M.D.   MG/MEDQ  D:  07/14/2007  T:  07/15/2007  Job:  [id number]

## 2011-02-05 NOTE — Procedures (Signed)
CAROTID DUPLEX EXAM   INDICATION:  Follow-up evaluation of known carotid artery disease.   HISTORY:  Diabetes:  No.  Cardiac:  MI in 2007, atrial fibrillation.  Hypertension:  Yes.  Smoking:  Former smoker.  Previous Surgery:  No.  CV History:  Previous duplex on 12/29/07 that revealed 40-59% right ICA  stenosis, 60-79% left ICA stenosis.  Amaurosis Fugax No, Paresthesias No, Hemiparesis No.                                       RIGHT             LEFT  Brachial systolic pressure:         146               148  Brachial Doppler waveforms:         Triphasic         Triphasic  Vertebral direction of flow:        Antegrade         Antegrade  DUPLEX VELOCITIES (cm/sec)  CCA peak systolic                   63                84  ECA peak systolic                   138               76  ICA peak systolic                   213               343  ICA end diastolic                   63                91  PLAQUE MORPHOLOGY:                  Calcified, mixed  Calcified  PLAQUE AMOUNT:                      Moderate          Moderate-to-severe  PLAQUE LOCATION:                    Proximal ICA      Proximal ICA   IMPRESSION:  1. 60-79% right internal carotid artery stenosis.  2. 60-79% left internal carotid artery stenosis (high end of range).   ___________________________________________  Di Kindle. Edilia Bo, M.D.   MC/MEDQ  D:  06/28/2008  T:  06/28/2008  Job:  [id number]

## 2011-02-05 NOTE — Assessment & Plan Note (Signed)
OFFICE VISIT   RUSS, LOOPER  DOB:  [birth year]                                       07/14/2007  ZOXWR#:[id number]   I saw Mr. Matsumoto in the office today for continued follow-up of his  carotid disease.  I have also been following him with peripheral  vascular disease.  Since I saw him last six months ago he has had no  history of stroke, TIA's, expressive or receptive aphagia, or amaurosis  fugax. He has stable claudication in both lower extremities.  He has had  some pain in his right leg at night which occurs when he lies down and  involves his entire right lower extremity.  He has had a previous  history of sciatica.  The pain seems to be relieved with ambulation.   On review of systems he has had no recent chest pain, chest pressure,  palpitations or arrhythmias.  Pulmonary, has had no bronchitis, asthma  or wheezing.   PHYSICAL EXAMINATION:  VITALS:  Blood pressure is 135/73, heart rate is  69.  He has a left carotid bruit.  LUNGS:  Clear bilaterally to auscultation.  CARDIAC EXAM:  He has a regular rate and rhythm.  ABDOMEN:  Obese and difficult to assess.  He has normal femoral pulses.  I cannot palpate popliteal or pedal pulses. However, both feet are warm  and well perfused without ischemic ulcers.  EXTREMITY:  Reveals some hyperpigmentation consistent with chronic  venous insufficiency.  He has no significant lower extremity swelling.   I reviewed his carotid duplex scan which shows bilateral 60-79% carotid  stenoses.   As he is asymptomatic I have explained we would not consider carotid  endarterectomy unless the stenosis progressed to greater than 80% or he  developed new neurologic symptoms.  I plan on seeing him back in 6  months with a follow-up duplex scan and he is due for ABI's at that time  also.  He knows to call sooner if he has any new neurologic symptoms.  He also knows to continue taking his Plavix.  With respect to his  right  leg pain I did not think that his pain was secondary to his peripheral  vascular disease as it involved his entire leg at night and he has  excellent femoral pulses.  I have encouraged him to stay as active as  possible.   Di Kindle. Edilia Bo, M.D.  Electronically Signed   CSD/MEDQ  D:  07/14/2007  T:  07/16/2007  Job:  [id number]

## 2011-02-05 NOTE — Discharge Summary (Signed)
NAME:  Gavin [name], Gavin [name] NO.:  [id number]   MEDICAL RECORD NO.:  [id number]          PATIENT TYPE:  INP   LOCATION:  2014                         FACILITY:  [facility]   PHYSICIAN:  Nanetta Batty, M.D.   DATE OF BIRTH:  1929-09-08   DATE OF ADMISSION:  10/01/2008  DATE OF DISCHARGE:  10/06/2008                               DISCHARGE SUMMARY   DISCHARGE DIAGNOSES:  1. Unstable angina, occluded left anterior descending coronary artery      stent at catheterization this admission, plans for medical therapy.  2. Ischemic cardiomyopathy with an ejection fraction of 30%.  3. History of paroxysmal atrial fibrillation, the patient is in sinus      rhythm, he is discharged on Lovenox to Coumadin crossover.  4. Treated hypertension.  5. Treated dyslipidemia.  6. History of sleep apnea, on continuous positive airway pressure.  7. History of peripheral vascular disease with bilateral carotid      disease.  8. History of benign prostatic hypertrophy.   HOSPITAL COURSE:  The patient is a 77 year old male followed by Dr.  Allyson Sabal.  He has had several interventions to the LAD.  He was recently  admitted with an SEMI and had LAD stenting by Dr. Clarene Duke a couple of  weeks ago, this was a difficult procedure.  He was sent to the ER by his  primary care doctor when he mentioned that he had been having some chest  pain.  Clear history is always difficult to elicit.  It did sound like  some of his symptoms were similar to his pre PCI symptoms.  His  troponins were elevated at 0.3.  CK-MBs were negative.  The patient was  put on heparin and his Coumadin was held.  He was set up for diagnostic  catheterization which was done on October 03, 2008, by Dr. Allyson Sabal.  RCA  is patent, left main is patent, circumflex and OMs are patent.  He has  an 80-90% first diagonal at the takeoff of the stent and then an  occluded distal portion of the stent with some right-to-left  collaterals.  Films were  reviewed by Dr. Allyson Sabal, Dr. Jacinto Halim, and Dr.  Clarene Duke.  Plan is for continued medical therapy at this time.  We feel he  can be discharged on October 06, 2008.  He will follow up with Dr. Allyson Sabal  as an outpatient.   DISCHARGE MEDICATIONS:  1. Claritin 10 mg a day.  2. Coumadin 5 mg on Monday, Wednesday, Friday, and Sunday, and 2.5 mg      on Tuesday, Thursday, and Saturday.  3. Flomax 0.4 mg a day.  4. Lipitor 40 mg a day.  5. Nasonex nasal spray at bedtime.  6. Prilosec 20 mg a day.  7. Toprol-XL 75 mg a day.  8. Vasotec 20 mg a day.  9. Calcium daily.  10.PreserVision eye drops daily.  11.Tylenol Arthritis p.r.n.  12.Lovenox 100 mg subcu q.12 for 6 doses.  13.Amlodipine 2.5 mg a day.  14.Imdur 60 mg a day.  15.Nitroglycerin sublingual p.r.n.   We stopped his Plavix.  LABORATORY STUDIES AT DISCHARGE:  White count 6.7, hemoglobin 12.2,  hematocrit 36, and platelets 272.  Sodium 144, potassium 4.5, BUN 11,  and creatinine 1.3.  His EKG shows sinus rhythm with anterior T-wave  inversion.  Abdominal ultrasound this admission showed of 4-mm  gallbladder polyp, but no other acute changes.   DISPOSITION:  The patient is discharged in stable condition.  He will  follow up with Dr. Allyson Sabal as an outpatient.  We have added aspirin 81 mg  a day as well as the above medicines.  He will have protime on Tuesday.      Abelino Derrick, P.A.      Nanetta Batty, M.D.  Electronically Signed    LKK/MEDQ  D:  10/06/2008  T:  10/07/2008  Job:  [id number]   cc:   Nanetta Batty, M.D.

## 2011-02-05 NOTE — Cardiovascular Report (Signed)
NAME:  Gavin [name], Gavin [name] NO.:  [id number]   MEDICAL RECORD NO.:  [id number]          PATIENT TYPE:  INP   LOCATION:  2905                         FACILITY:  [facility]   PHYSICIAN:  Thereasa Solo. Little, M.D. DATE OF BIRTH:  1929/05/06   DATE OF PROCEDURE:  09/20/2008  DATE OF DISCHARGE:                            CARDIAC CATHETERIZATION   INDICATIONS FOR TEST:  This 77 year old male has had chest pain for 1  week in duration.  It intensified tonight.  He went to Urgent Care in  [facility] and was sent to [facility] by what appears to be  [facility].  He arrived in the emergency room around 6:30 p.m.  I was  called for the STIMI at 7:15 p.m. and the wire across the lesion at 8:20  p.m. and the balloon time was at 8:37.   He had previous stents in his LAD and right coronary artery.  He also  has peripheral vascular disease with stents.   He was taken directly to the Cath Lab from the emergency room.  No labs  were obtained.  He was on Coumadin and when his labs returned about half  way through the case, it revealed an INR of 2.8.  He had been on  Coumadin for atrial fib and he is also on Plavix chronically for his  peripheral vascular disease and because of the stents.  His last cardiac  stent was to his RCA in 2007, it was a non DES stent.   The patient was prepped and draped in the usual sterile fashion exposing  the right groin.  Following local anesthetic with 1% Xylocaine, the  Seldinger technique was employed and a 6-French introducer sheath was  placed in the right femoral vein and a 6-French introducer sheath in the  right femoral artery.  Left and right coronary arteriography was  performed.  A complex PTI to the LAD was performed and a ventriculogram  was performed at the end of the procedure.   COMPLICATIONS:  None.   TOTAL CONTRAST:  190 mL.   EQUIPMENT:  6-French Judkins configuration diagnostic catheters.   RESULTS:  1. Hemodynamic monitoring.   His central aortic pressure was 189/87.      His left ventricular pressure was 185/1 with a left ventricular end-      diastolic pressure 24.  2. Ventriculography.  Ventriculography at the end of the procedure      revealed the apical segment to be akinetic.  The ejection fraction      was 35-40%.  The left ventricular end-diastolic pressure was      elevated at 24.   CORONARY ARTERIOGRAPHY:  1. Left main normal.  2. Circumflex.  This gave rise to single OM vessel that was free of      disease.  3. LAD.  In the LAD were overlapping stents in the proximal portion.      There was minimal irregularities just proximal to the stents and      within the stents itself.  Just distal to the last stent was an  area of subtotaled occlusion where the vessel took a 90 degree      turn.  Following this area being subtotaled, there was mild diffuse      irregularities in the LAD which then bifurcated into the twin LAD      system.  The first diagonal had ostial 40-50% narrowing.  4. Right coronary artery.  The right coronary artery had a stent in      its proximal portion that was widely patent.  There was minimal      irregularities in the distal vessel with the PDA and posterolateral      vessel being free of disease.   A complex intervention was then undertaken to the subtotaled mid LAD.  A  6-French JL-3.5 guide catheter was used and a short Luge wire.  The wire  was easily placed down the LAD and well past the lesion.  Attempts at  placing the balloon, however were difficult and I had to change from a  JL-4 to a JL-3.5.  Once appropriate backup was obtained, I was able to  pass a 2.0 x 15 Apex balloon into the area of obstruction.  A single  inflation was performed.  Interestingly, the vessel had been subtotaled  with my nondiagnostic shocks but when the Angiomax was on board, my next  angiogram showed the vessel completely occluded.  Once the wire passed,  blood flow was immediately  reestablished and angioplasty was performed  and never closed again.   Once I had angioplastied this area, I tried to place a 2.5 x 12 Micro-  Driver stent.  The stent would not pass into the proximal stents.  There  was no obvious lesion here.  I used both a Whisper and a Luge wire as  buddy wires in an attempt to pass the stent but I could not get the  stent to pass.   I finally angioplastied the proximal stent with a 2.5 x 15 Apex balloon  despite there not being an obvious lesion.  With this, I was then able  to pass the stent into the preexisting stents and finally across the  area of subtotaled obstruction.  Once it was in place, I overlapped the  stents and deployed a 2.5 x 12 MicroDriver at 10 atmospheres for 37  seconds with a final inflation being 11 atmospheres for 38 seconds.  I  postdilated the overlapped segment only with a 2.5 x 8 [place] Voyager at 12  atmospheres for 41 seconds.  My big concern was that I may perforate the  vessel because of the abrupt turn.  This did not happen and there was an  excellent result.  Following stent placement, the vessel appeared to be  normal.  The area that been totaled and then subtotaled with only TIMI I  or TIMI zero flow now appeared to be completely normal with brisk TIMI  III flow.  There is as pointed out earlier mild diffuse irregularities  in the ongoing LAD as it bifurcates into a twin LAD system.   I discussed with the pharmacy the anticoagulation issues.  One concern  with the INR of 2.8 and femoral sheath is whether or not I could safely  give him vitamin K versus fresh frozen plasma.  Recommendations from  pharmacy were to give him IV vitamin K which I have ordered 2 mg IV with  plans to repeat an INR in the morning.  If he is still well  anticoagulated, tomorrow I would  use a fresh frozen plasma.  The  Angiomax was terminated following the intervention and unfortunately  there was a late entry from the emergency room for he  had received 5000  units of IV heparin.  We had checked previously and there was no mention  of any pre-treatment with anticoagulants.   The patient is stable at this time having only minimal residual chest  awareness.  Hopefully, the sheaths could be removed tomorrow.   His blood pressure had been markedly elevated.  He is on IV  nitroglycerin for this and we will restart his clonidine.           ______________________________  Thereasa Solo Little, M.D.     ABL/MEDQ  D:  09/19/2008  T:  09/20/2008  Job:  [id number]   cc:   Nanetta Batty, M.D.  Dr. Lorin Picket  Cath Laboratory

## 2011-02-05 NOTE — Discharge Summary (Signed)
NAME:  Gavin [name], Gavin [name] NO.:  [id number]   MEDICAL RECORD NO.:  [id number]          PATIENT TYPE:  INP   LOCATION:  2905                         FACILITY:  [facility]   PHYSICIAN:  Thereasa Solo. Little, M.D. DATE OF BIRTH:  26-Mar-1929   DATE OF ADMISSION:  09/19/2008  DATE OF DISCHARGE:  09/22/2008                               DISCHARGE SUMMARY   DISCHARGE DIAGNOSES:  1. Acute ST elevation anterior myocardial infarction.  2. Coronary artery disease, now with new stenosis just distal to      previous stent in the left anterior descending of subtotaled      occlusion.  At this site, the vessel took a 90-degree turn and      following that area being subtotaled,  there was mild diffuse      irregularities in the left anterior descending which then      bifurcated into the twin left anterior descending system.      a.     Complex intervention, very difficult due to the 90-degree       turn of the vessel and stent deployment with a Driver stent was       placed.  3. Known coronary artery disease with previous acute myocardial      infarctions in September 2005 with left anterior descending Cypher      stent and distal left anterior descending angioplasty and second      diagonal angioplasty and then in September 2007, another acute      anterior myocardial infarction with stent to the left anterior      descending and then proximal and ostial right coronary artery      stents were placed as well and an angioplasty of a jailed ostial of      the diagonal.  4. Paroxysmal atrial fibrillation, maintained sinus rhythm on      Coumadin.  5. Hypertension.  6. Hyperlipidemia.  7. Obstructive sleep apnea on CPAP.  8. Peripheral arterial disease with bilateral superficial femoral      artery stenting in 2004 and moderate-to-moderately severe left      internal carotid artery stenosis by Doppler.  9. Benign prostatic hyperplasia.  10.Chronic back pain.  11.Recent bronchitis treated  for over 15 days with antibiotics.  12.Abnormal MCV, awaiting B12 level at discharge.  13.Elevated WBC with negative UA and stable PA and lateral chest x-      ray.  14.Mild systolic heart failure postprocedure, treated with Lasix and      resolved.   DISCHARGE CONDITION:  Improved.   PROCEDURES:  1. Emergent cardiac catheterization September 20, 2008 by Dr. Julieanne Manson.  2. September 20, 2008, percutaneous transluminal coronary angioplasty      and stent deployment to the left anterior descending at 90-degree      turn distal to previously placed stent, quite difficult procedure.   Please note the patient was brought from [facility] by EMS.  We  never received any records from EMS or [place] and we were told  initially the patient did not receive  heparin or aspirin in the  emergency room, but the patient was on Plavix and Coumadin anyway as an  outpatient.  We held anticoagulation until we could get a INR back, so  the patient would not have severe side effects of bleeding and the  patient was started on Angiomax with his procedure.  Once we got the ER  sheet, it was noted he was given 324 mg of aspirin in the emergency room  and heparin 4000 units.  A drip was never started, but we assumed that  this is in fact correct as the patient's PTT was 70.  Please note  initial INR was also 2.8.   HISTORY OF PRESENT ILLNESS:  A 77 year old white married male patient of  Dr. Hazle Coca with history of coronary artery disease with anterior MI in  2005 and anterior MI in 2007 with previous procedures as previously  stated, developed chest pain on September 12, 2008 that would come and go  episodically.  He did not have any on Sunday or Monday morning, but on  September 19, 2008 on around 1:30 to 4 p.m., he developed chest pain.  He  went to Urgent Care in [place] and was given nitroglycerin sublingual.  He had not been given aspirin there.  He had relief and they transferred  him  to [facility].  At [facility] in the emergency room, STEMI was called with ST  elevation in his anterior leads.  He was hypertensive.  He was in sinus  rhythm.  He had no pain.   Dr. Clarene Duke underwent cardiac catheterization.  We proceeded with the  cath despite not knowing anticoagulation status with Coumadin.   The patient denied any associated symptoms with his chest pain, no  shortness of breath, diaphoresis, nausea, or vomiting.   PAST MEDICAL HISTORY:  See problem list.  He additionally had remote  rotator cuff repair.  He had also been off Lipitor for some time for  awhile due to muscle aches, but he had been back on it.   ALLERGIES:  SULFA, CIPRO, MICARDIS, and CODEINE.   FAMILY HISTORY:  See H and P.   SOCIAL HISTORY:  See H and P.   REVIEW OF SYSTEMS:  See H and P.   PHYSICAL EXAMINATION AT DISCHARGE:  VITAL SIGNS:  Blood pressure 123/57,  pulse 89, respirations had not been placed in the computer initially but  18 respirations, temperature 98.6, oxygen saturation 100% on room air.  He complained on the morning of discharge with back pain.  He had  reached for his urinal during the night and started having muscle spasm.  NEURO:  Alert and oriented x3.  HEART:  Regular rate and rhythm without murmur.  LUNGS:  Clear to auscultation bilaterally.  ABDOMEN:  Soft, nontender.  EXTREMITIES:  No edema.  Cath site was stable.  He did have right low  back tenderness.   At discharge, his white count was slightly elevated.  His UA has been  negative.  We had PA and lateral chest x-ray done prior to discharge.  Cardiac Rehab had seen the patient and had ambulated.  He did quite well  except for his arthritis in his hip.   LABORATORY DATA ON ADMISSION:  Hemoglobin 14.3, hematocrit 42.4, WBC  8.5, platelets 337, MCV 99.7 and neutrophils 67.  MCV at discharge was  101.2, white count was 11.9, but hemoglobin and hematocrit remained  stable as well as his platelets.  Chemistry on admission,  sodium 139,  potassium  4.2, chloride 107, CO2 24, BUN 17, creatinine 1.09 and glucose  95.  These remained stable.  Prior to discharge, potassium was up to 5.  We did hold or discontinued his spironolactone due to that.   PT on admission was 31.4 and this was in the cath lab several minutes  into the procedure, INR was 2.8 and PTT was 72.  INR gradually drifted  down.  We did give the patient vitamin K after the procedure and  secondary to all of the anticoagulation had given in a 77 year old male.  LFTs were normal.  AST 37, ALT 19, alkaline phos 62, total bili 0.7,  albumin 3.3.   Please note at discharge, protime 14.8, INR 1.1.   On cardiac markers; initial CK-MB was 339 with an MB of 14.3, and  troponin of 2.65, troponin peaked at 8.13 with a CK of 263 and an MB of  12.3.  Troponin and CK-MB came down.  His EKG still looked with some ST  elevation.  We repeated troponin I on the September 21, 2008 and CK-MBs  and they continued to drop, troponin 1.94, CK 111, MB 5.3.   Calcium ranged 8.6 to 8.3, magnesium 2.4.  UA was clear and BNP on  admission was 466, TSH 1.325.  BNP by the next morning was 744, he was  given Lasix and BNP came down to 393.  Prior to discharge, BNP was 362  and as stated, the B12 and folate are pending at discharge.   Chest x-ray on admission; no acute cardiopulmonary findings.  He did  have mild asymmetric elevation of the left hemidiaphragm.  Followup on  the September 20, 2008, linear atelectasis of the left lung base and the  right lung was clear.  He was given IV Lasix, he diuresed, his BNP  reduced.  PA and lateral chest x-ray done prior to discharge on September 22, 2008.  No acute infiltrate or edema, mild degenerative changes in  the lower thoracic spine, mild elevation of the left hemidiaphragm.  There was no pulmonary edema, no acute infiltrate or pleural effusion.   EKGs at 6:03 p.m. did have ST elevations in his anterior as well as  lateral leads.   Follow up at 18:42, the lateral leads actually looked a  little more improved and continued elevation in his V2, V3 and these  were new from Jan 25, 2007.   Postprocedure on September 20, 2008, minimal ST elevations in V2 and V3.  He has some evolutionary changes with T-wave being biphasic in his  lateral leads.  Later in the day on September 20, 2008, V2 and V3  continue with some anterior elevations but his V4, V5 and V6 leads were  normal and then on September 21, 2008 continued to be no changes and at  the time of discharge, again no changes.   HOSPITAL COURSE:  The patient was admitted by Dr. Clarene Duke with ST  elevation MI on September 19, 2008.  He had come by ambulance from urgent  care.  The nurse relayed the patient had not received any medications in  the ER, but once the patient had been started with the case, the ER  sheets came up and heparin and aspirin were documented.  The patient was  already on Plavix and Coumadin and when we got the lab work back, INR  was 2.8.  The PTT was elevated at 77, which we assumed was secondary to  the heparin bolus, though no drip  was started and the patient had  received aspirin in the emergency room.  He had already been started on  Angiomax at this point in the cath lab.  Dr. Clarene Duke proceeded  intervening on the patient's close vessel with good results.  The  patient stabilized.  He had no bleeding or hematoma.  He was given  vitamin K IV to ensure that he did not have further bleeding issues.  He  continued his Angiomax and by the next morning, aspirin was started on  the patient as well as he continued his Plavix and by September 21, 2008,  Coumadin was started for his paroxysmal AFib and though he is  maintaining sinus rhythm.  By September 22, 2008, he complained of some  back pain as he reached to get the urinal and he had some muscle spasms,  but no chest pain.  He was able to ambulate with cardiac rehab without  problems except for his  arthritic pains.  He will be discharged around 3  p.m. if he is stable from the back spasm portion as well as no chest  pain.  The patient is also afebrile.  He will follow up with Dr. Allyson Sabal  as previously instructed.  Dr. Clarene Duke saw him on the day of admission.  The patient will continue Coumadin and have INR evaluated on Monday,  September 27, 2007.   DISCHARGE MEDICATIONS:  1. Claritin 5 mg daily.  2. Coumadin 5 mg daily except 2.5 mg Tuesday, Thursdays, and      Saturdays.  3. Flomax 0.4 mg daily.  4. Lipitor 40 daily.  5. Nasonex nasal spray daily.  6. Plavix 75 mg once daily, do not stop.  7. Prilosec 20 mg daily.  8. Stop spironolactone secondary to elevated potassium.  9. Toprol-XL 100 mg every evening.  10.Vasotec 20 mg daily.  11.Continue fish oil at home 1 g daily secondary to his HDL being 34.  12.Citrucel fiber as needed.  13.Aspirin 81 mg daily until INR is therapeutic and then may      discontinue.  14.Tylenol Arthritis 650 mg as needed.  15.PreserVision eye tablets one daily, these are eye vitamins.  16.Calcium 600 daily.  17.Cetaphil lotion to feet daily.  18.He can continue the Lasix 40 mg as needed at home daily p.r.n.,      clonidine 0.1 mg as needed once a day, and clorazepate 7.5 mg as      needed 2 times a day, and cyclobenzaprine 10 mg as needed 3 times a      day and the cyclobenzaprine he has at home, also nitroglycerin      sublingually as needed for chest pain.      Darcella Gasman. Ingold, N.P.    ______________________________  Thereasa Solo Little, M.D.    LRI/MEDQ  D:  09/22/2008  T:  09/23/2008  Job:  [id number]   cc:   Nanetta Batty, M.D.  Lucila Maine, M.D.

## 2011-02-05 NOTE — Assessment & Plan Note (Signed)
OFFICE VISIT   MONTRAVIOUS, WEIGELT  DOB:  [birth year]                                       12/29/2007  EAVWU#:[id number]   I saw the patient in the office today for continued followup of his  carotid disease and peripheral vascular disease.  Since I last saw him  in October 2008, he has had no history of stroke, TIA, expressive or  receptive aphasia, or amaurosis fugax.  He does have some mild lower  extremity claudication, but no rest pain or history of non-healing  ulcers.  He also has some hip pain likely related to bursitis in his  right hip.   REVIEW OF SYSTEMS:  He has had no recent chest pain, chest pressure,  palpitations, or arrhythmias.  He has had no bronchitis, asthma, or  wheezing.   PAST MEDICAL HISTORY:  Significant for hypertension and history of  atrial fibrillation in the past.  He has no history of diabetes.  He had  a myocardial infarction in September 2005.  He has no history of  congestive heart failure.   FAMILY HISTORY:  There is no history of premature cardiovascular  disease.   SOCIAL HISTORY:  He is married.  He has 3 children.  He works as a part-  Science writer.  He quit tobacco in 1977.   MEDICATIONS:  1. Claritin 5 mg p.o. q. a.m.  2. Coumadin 5 mg Monday, Wednesday, Friday, and Sunday.  He takes 2.5      mg on Tuesday, Thursday, and Saturday.  3. Flomax 0.4 mg p.o. nightly.  4. Lipitor 40 mg p.o. nightly.  5. Nasonex 50 mcg AC evening.  6. Plavix 75 mg q. a.m.  7. Prilosec 20 mg p.o. q. a.m.  8. Spironolactone 25 mg 1/2 tab p.o. q. a.m.  9. Toprol XL 100 mg p.o. q. a.m.  10.Vasotec 20 mg p.o. q. a.m.   PHYSICAL EXAMINATION:  This is a pleasant 77 year old gentleman who  appears his stated age.  Blood pressure is 136/70, heart rate is 56.  HEENT, his neck was supple.  There is no cervical lymphadenopathy.  He  has soft bilateral carotid bruits.  Lungs are clear bilaterally to  auscultation.  On cardiac exam, he has a  regular rate and rhythm.  His  abdomen is soft and nontender.  Lungs clear bilaterally to auscultation.  He has normal-pitched bowel sounds.  He has palpable femoral pulses.  I  cannot palpate popliteal or pedal pulses.  He has no significant lower  extremity swelling.  Neurologic exam is nonfocal.   Carotid duplex scan shows a 40-59% right carotid stenosis with a 60-79%  left carotid stenosis.  Both vertebral arteries are patent with normally  directed flow.  ABI 59% on the right and 65% on the left.  He has  monophasic Doppler signals in both feet.   With respect to his carotid disease, his velocities are fairly stable  compared to a study 6 months ago.  His stenoses are clearly less than  80%.  He understands that we would not consider endarterectomy unless he  developed new neurologic symptoms or his stenoses progressed to greater  than 80%.  His infrainguinal arterial occlusive disease likewise is  stable and his ABI are fairly stable.  I think we can recheck these in 1  year.  I have  encouraged him to stay as active as possible.  Fortunately, he is not a smoker.   I will see him back in 6 months for a carotid duplex scan.  He knows to  call sooner if he has problems.  He will continue taking his Plavix.   Di Kindle. Edilia Bo, M.D.  Electronically Signed   CSD/MEDQ  D:  12/29/2007  T:  12/30/2007  Job:  [id number]

## 2011-02-05 NOTE — Cardiovascular Report (Signed)
NAME:  Gavin [name], Gavin [name] NO.:  [id number]   MEDICAL RECORD NO.:  [id number]          PATIENT TYPE:  INP   LOCATION:  2010                         FACILITY:  [facility]   PHYSICIAN:  Thereasa Solo. Little, M.D. DATE OF BIRTH:  March 13, 1929   DATE OF PROCEDURE:  09/20/2008  DATE OF DISCHARGE:  09/24/2008                            CARDIAC CATHETERIZATION   ADDENDUM:  This intervention took longer than normal as a result of  complicated anatomy and previously placed stents.  The delay was  partially related to the patient's underlying anatomy.           ______________________________  Thereasa Solo Little, M.D.     ABL/MEDQ  D:  09/30/2008  T:  10/01/2008  Job:  [id number]   cc:   Catheterization Laboratory

## 2011-02-05 NOTE — Cardiovascular Report (Signed)
NAME:  RANDY, CASTREJON NO.:  [id number]   MEDICAL RECORD NO.:  [id number]          PATIENT TYPE:  INP   LOCATION:  2924                         FACILITY:  [facility]   PHYSICIAN:  Nanetta Batty, M.D.   DATE OF BIRTH:  07-10-1929   DATE OF PROCEDURE:  DATE OF DISCHARGE:                            CARDIAC CATHETERIZATION   Mr. Durnin is a delightful 77 year old mildly overweight married white  male with history of CAD and PAD.  He suffered an anterior wall  myocardial infarctions on June 03, 2006, and stenting of his LAD.  On June 06, 2008, he had staged stenting of his RCA.  Paroxysmal  AFib, on Coumadin anticoagulation.  His other problems include  hypertension and hyperlipidemia, obstructive sleep apnea, on CPAP.  He  also has a history of PAD with bilateral SFA stenting in the past.  He  had an anterior wall myocardial infarction at the end of last year.  He  underwent a cath by Dr. Clarene Duke revealing high-grade mid LAD lesion.  It  had a difficult but successful stenting procedure with a Micro-Driver  bare-metal stent.  He has had recurrent chest pain over the last several  weeks and was admitted on October 01, 2008.  He ruled out for myocardial  infarction with declining troponins and there were no EKG changes.  He  presents now for diagnostic coronary angiography to define his anatomy  and rule out ischemic etiology.   DESCRIPTION OF PROCEDURE:  The patient was brought to second floor [facility] in the postabsorptive state.  He was premedicated with  p.o. Valium.   His right groin was prepped and shaved in the usual sterile fashion.  Xylocaine 1% was used for local anesthesia.  A 6-French sheath was  inserted to the right femoral artery using standard Seldinger technique.  A 6-French right-left Judkins diagnostic catheter as well as a 6-French  pigtail catheter were used for selective coronary angiography, left  ventriculography, and  subselective left internal mammary artery  angiography.  Visipaque dye was used for the entirety of the case.  Retrograde aortic, left ventricular, and pullback pressures were  recorded.   HEMODYNAMICS:  1. Aortic systolic pressure 150, diastolic pressure 68.  2. Left ventricular systolic pressure 153, end-diastolic pressure 25.  3. Selective coronary angiography.      a.     Left main, normal.      b.     LAD; the LAD was occluded after the previously placed stent       in its midportion.  The distal LAD filled faintly by left-to-left       collaterals.  There was a large proximal diagonal branch, which       had a 90% ostial stenosis.      c.     Circumflex; free of any significant disease.      d.     Right coronary artery; the proximal right coronary stent was       widely patent.  The remainder of vessel had noncritical CAD.       There  were also faint right-to-left collaterals.      e.     Left internal mammary artery; subselectively visualized it       was widely patent.  It was suitable for use during coronary artery       bypass grafting if necessary.  4. Left ventriculography; RAO left ventriculogram was performed using      25 mL of Visipaque dye at 12 mL per second.  The overall LVEF was      estimated at approximately 30% with anteroapical akinesia.   IMPRESSION:  Mr. Mcphearson has an occluded left anterior descending at the  previously stented site 2-3 weeks ago with an akinetic anterior wall.  It is unclear whether attempts of percutaneous revascularization would  be successful with regards to long-term patency.  He also has an ostial  diagonal branch lesion, which may be amenable to percutaneous  intervention as well.  The sheath was removed and pressure was held in  the groin to achieve hemostasis.  The patient left the lab in stable  condition.  I will review the clinical history and the angiographic  results with my colleagues prior to making a final disposition with   regards to medical therapy versus revascularization.   The sheath was removed and pressure was held in the groin to achieve  hemostasis.  The patient left lab in stable condition.      Nanetta Batty, M.D.  Electronically Signed     JB/MEDQ  D:  10/03/2008  T:  10/04/2008  Job:  [id number]   cc:   [facility]  [facility]  Molly Maduro MD Lorin Picket

## 2011-02-05 NOTE — Procedures (Signed)
LOWER EXTREMITY ARTERIAL EVALUATION-SINGLE LEVEL   INDICATION:  Follow-up evaluation of lower extremity arterial occlusive  disease.   HISTORY:  Diabetes:  No.  Cardiac:  MI in 2007.  Atrial fibrillation.  Hypertension:  Yes.  Smoking:  Patient smoked for 20 years.  Previous Surgery:  Bilateral superficial femoral artery stents.   RESTING SYSTOLIC PRESSURES: (ABI)                          RIGHT                LEFT  Brachial:               126                  118  Anterior tibial:        74 (0.59)            74 (0.65)  Posterior tibial:       60                   82  Peroneal:  DOPPLER WAVEFORM ANALYSIS:  Anterior tibial:        Monophasic           Monophasic  Posterior tibial:       Monophasic           Monophasic  Peroneal:   PREVIOUS ABI'S:  Date: 12/24/06  RIGHT:  0.64  LEFT:  0.71   IMPRESSION:  Ankle brachial indices are stable from the previous study.   ___________________________________________  Di Kindle. Edilia Bo, M.D.   MC/MEDQ  D:  12/29/2007  T:  12/29/2007  Job:  [id number]

## 2011-02-08 NOTE — Assessment & Plan Note (Signed)
[facility]                               PULMONARY OFFICE NOTE   Gavin [name], Gavin [name]                       MRN:          [id number]  DATE:06/30/2006                            DOB:          14-Jun-1929    PULMONARY/SLEEP FOLLOWUP   PROBLEM:  1. Obstructive sleep apnea.  2. Esophageal reflux.  3. Coronary disease with myocardial infarction.   HISTORY:  One-year followup.  Since last here, he has had a second MI.  He  is continued on CPAP at 11 CWP.  Starting with a replacement mask, he began  being bothered by more air in his mouth and oral venting.  Otherwise, he  says he has slept well.  Wife denies that he snores.  He admits he can still  doze off if he is very quiet and comfortable during the day.  Gas pains  occasionally from swallowed air.  Denies nasal congestion.   MEDICATION:  1. Aspirin 81 mg.  2. Claritin 5 mg.  3. Coumadin.  4. Flomax 0.4 mg.  5. Spironolactone 1/2 of a 25 mg.  6. Lipitor 40 mg.  7. Nasonex.  8. Plavix 75 mg.  9. Prilosec 20 mg.  10.Toprol XL 100 mg.  11.Vasotec 20 mg.  12.Tylenol Arthritis 2 tablets b.i.d.  13.Fish oil.  14.Calcium.   DRUG INTOLERANCE TO CIPRO, CODEINE, SULFA, AND MICARDIS.   OBJECTIVE:  Weight 214 pounds.  BP 162/80.  Pulse regular 80.  Room air  saturation 98%.  He looked comfortable enough.  Pulse is regular.  I hear no murmur or gallop.  Nasopharynx is clear.  Lungs are clear with, specifically, no rales, dullness, cough, or wheeze,  and no increased work of breathing.  There is no stridor or neck vein distention and no peripheral edema.   IMPRESSION:  Obstructive sleep apnea, adequately controlled on continuous  positive airway pressure at 11, but this may be more pressure than he needs.   PLAN:  [facility] is to reduce his CPAP from 11 down to 10 CWP.  He will call us if this is not comfortable and does not correct his  complaints.  Schedule return in 1 year.  He is  encouraged to continue  working to keep his weight down.       Clinton D. Maple Hudson, MD, [facility], FACP      CDY/MedQ  DD:  07/01/2006  DT:  07/03/2006  Job #:  [id number]   cc:   Lucila Maine, MD  Orlean Bradford, M.D.

## 2011-02-08 NOTE — Op Note (Signed)
NAME:  Gavin [name], Gavin [name]                          ACCOUNT NO.:  [id number]   MEDICAL RECORD NO.:  [id number]                   PATIENT TYPE:  AMB   LOCATION:  DSC                                  FACILITY:  [facility]   PHYSICIAN:  Katy Fitch. Naaman Plummer., M.D.          DATE OF BIRTH:   DATE OF PROCEDURE:  10/12/2002  DATE OF DISCHARGE:                                 OPERATIVE REPORT   PREOPERATIVE DIAGNOSIS:  Chronic retracted three tendon rotator cuff tear  with signs of impending cuff tear arthropathy with chronic pain and  progressive loss of motion.   POSTOPERATIVE DIAGNOSIS:  Identification of significant AC arthropathy, type  II acromial morphology, grade II chondromalacia of the glenoid, no  significant chondromalacia of the humeral head.   PROCEDURE:  1. Diagnostic arthroscopy right glenohumeral joint followed by arthroscopic     debridement of multiple labral tears and grade II chondromalacia.  2. Open reconstruction of a chronic retracted rotator cuff tear with     decortication and lowering of profile of greater tuberosity.  3. Incidental subacromial decompression.  4. Open resection of distal clavicle and repair of trapezius to anterior     deltoid, i.e., Mumford procedure.   SURGEON:  Katy Fitch. Sypher, M.D.   ASSISTANT:  Jonni Sanger, P.A.   ANESTHESIA:  General orotracheal, no interscalene block was used due to  history of mild sleep apnea and use of a CPAP machine.  Attending  anesthesiologist was Bedelia Person, M.D.   INDICATIONS FOR PROCEDURE:  The patient is a 77 year old right-hand-dominant  man referred by Cindee Salt, M.D. for evaluation and management of a  chronically painful right shoulder. Dr. Merlyn Lot had been treating the patient  for the past six months with a nonoperative program of activity  modification, therapy, and subacromial injection.  He had also had previous  subacromial injection by Dr. Lorin Picket[place]   Due to a failure to respond to nonoperative measures, Dr. Merlyn Lot  obtained an MRI which revealed a retracted rotator cuff tear. He referred  the patient for evaluation and management.   Clinical examination revealed signs of impending cuff tear arthropathy with  restriction in range of motion and increasing pain. He had proximal  migration of the humeral head and eburnation of the deep surface of the  acromion and greater tuberosity. We recommended proceeding with diagnostic  arthroscopy and efforts to stabilize the humeral head by either partial or  complete repair of the rotator cuff.   After informed consent, the patient was brought to the operating room at  this time.  Preoperatively, he was advised of the potential risks and  benefits including anesthesia complications, infection, stiffness, and  possible breakdown of the cuff repair if it was achieved.  Preoperatively,  he was screened by his cardiologist, Richard A. Alanda Amass, M.D., and cleared  for general anesthesia.   DESCRIPTION OF PROCEDURE:  The patient was brought to the operating room and  placed in the supine position on the operating table.  Following induction  of general orotracheal anesthesia, he was carefully positioned in the beach  chair position with torso and head holders for arthroscopy.   The entire right upper extremity and forequarter were prepped with DuraPrep  and draped with impervious arthroscopy drapes.   The procedure commenced with instrumentation of the shoulder through  standard posterior approach with a blunt trocar. Immediately a hemorrhagic  effusion was drained. It was turbid and consistent with an inflammatory  hemorrhagic effusion. This did have a normal string sign.   The joint was cleared of bloody effusion by creation of an anterior portal  and thorough irrigation with the arthroscopy pump. Diagnostic arthroscopy  revealed intact condylar articular cartilage surfaces on the humerus. The   glenoid had grade II chondromalacia in its inferior half and significant  labral tears were noted at 1 o'clock, 2 o'clock, 8 o'clock, and 7 o'clock.  An anterior suction shaver approach was used to debride the labrum.   The rotator cuff tear was easily documented.  The deep surface of the  acromion was easily visualized.   The margins of the cuff tear extended from the subscapularis anteriorly to  the teres minor posteriorly. The entire supraspinatus and infraspinatus were  retracted back to the level of the glenoid, however, there appeared to be  adequate tendon immobilized for a possible repair.  Therefore, the  arthroscopic equipment was removed and the subacromial space exposed through  an incision extending from the distal clavicle to the lateral and anterior  third deltoid interval.   The AC capsule was taken down sharply and the distal 15 mm of clavicle was  resected with open technique utilizing an oscillating saw.  Bone on bone  eburnated arthritis was noted at the [facility] joint.   A medial osteophyte at the acromion was removed with rongeurs followed by  use of an oscillating saw to level the acromion to a type I morphology.  The  anterior third of the deltoid was elevated off the acromion, and the cuff  tear visualized. The margins of the cuff tear were freshened and with great  effort utilizing a Cobb elevator and digital dissection, the retracted cuff  was mobilized. I was able to pull the teres minor and infraspinatus  anteriorly from its posteriorly displaced position. The supraspinatus had  shifted medially and posteriorly and was able to be recovered.   The running baseball stitch was used to fix the rotator interval between the  infraspinatus and supraspinatus followed by reconstruction of the avulsed  cuff to the greater tuberosity with use of suture anchor and through-bone  suture.  A power bur was used to decorticate the entire greater tuberosity and lowest  profile  approximately 4 mm and round it to prevent impingement.  Complete  coverage of the head was achieved with reasonable tendon.   While it is unlikely the patient will recover full motion of his shoulder  due to antecedent stiffness, it is quite likely that we will be able to  improve his pain syndrome by achieving coverage of the head and prevention  of superior migration of the head against the acromion.   The cuff repair was smoothed with a running suture of #2 FiberWire and then  the deltoid was repaired to the acromion with mattress sutures of #2  FiberWire and the deltoid was repaired to the trapezius, closing the dead  space created by distal clavicle excision with mattress sutures of #2  FiberWire.  The deltoid split between the anterior and middle third was  repaired with mattress sutures of 0 Vicryl.   The wound was thoroughly lavaged with sterile saline followed by closure  with subdermal sutures of 2-0 Vicryl, intradermal 3-0 Prolene, and Steri-  Strips.   The patient tolerated surgery and anesthesia well.  He was transferred to  the recovery room with stable vital signs.   We anticipate [facility] for observation of his  vital signs followed by discharge in the morning to home.  There were no  apparent complications.                                               Katy Fitch Naaman Plummer., M.D.    RVS/MEDQ  D:  10/12/2002  T:  10/12/2002  Job:  [id number]   cc:   Cindee Salt, M.D.  [address]  [place]  [place] [address]  Fax: [phone]

## 2011-02-08 NOTE — Cardiovascular Report (Signed)
NAME:  Gavin [name], Gavin [name] NO.:  [id number]   MEDICAL RECORD NO.:  [id number]                   PATIENT TYPE:  OIB   LOCATION:  3728                                 FACILITY:  [facility]   PHYSICIAN:  Nanetta Batty, M.D.                DATE OF BIRTH:  Feb 04, 1929   DATE OF PROCEDURE:  05/16/2003  DATE OF DISCHARGE:                              CARDIAC CATHETERIZATION   INDICATIONS:  Mr. Walmsley is a 77 year old gentleman with history of PVOD.  He  underwent stenting in 1999.  His other problems include hypertension and  hyperlipidemia.  He has had a nonhealing ulcer on his right foot.  Doppler  suggested high disease in his mid right SFA.  He has been getting local  wound care.  He presents now for angiography and potential intervention for  critical ischemia.   PROCEDURE DESCRIPTION:  The patient was brought to the sixth floor [facility] in the postabsorptive state.  He  was premedicated with p.o. Valium.  His left groin was prepped and shaved in  the usual sterile fashion.  1% Xylocaine was used for local anesthesia.  A 5  French sheath was inserted into the left femoral artery using standard  Seldinger technique. A 5 [place] Tenn[employer] Racquet catheter and short IMA were  used for __________ and distal abdominal aortography, and bifemoral runoff  as well as contralateral access using a 7 [place] crossover Terumo sheath.  Isovue dye was used for the entirety of the case.  Retrograde aortic  pressure was monitored during the case.   ANGIOGRAPHIC RESULTS:  1. Abdominal aorta.     a. Renal arteries normal.     b. Infrarenal abdominal aorta normal.  2. Left lower extremity.     a. 90% focal in-stent restenosis within the distal left SFA stent with        one vessel runoff via peroneal.  3. Right lower extremity.     a. 90% mid to distal right SFA stenosis.     b. Zero vessel runoff.   PROCEDURE DESCRIPTION:  The patient  received 2500 units of heparin  intravenously.  A 7 French Terumo sheath was then placed contralaterally  using a 3.5 angle glide followed by a Wholey wire.  PTA was performed using  a 4 x 2 PowerFlex and stenting using a 7 x 4 Smart.  Post dilatation was  performed with a 6 x 4 Smart resulting in reduction of a 90% mid to distal  right  SFA stenosis to 0% residual without dissection.  The peroneal did  light up by collaterals in the mid calf, but there was no even collateral  runoff to the foot.   IMPRESSION:  Successful right superficial femoral artery percutaneous  transluminal angioplasty and stenting for critical limb ischemia in order to  improve collateral filling and hopefully promote healing.   An  ACT was measured and the sheaths were removed.  Pressure was held to the  groin to achieve hemostasis.  The patient left the lab in stable condition.  He will be hydrated overnight and discharged home in the morning and will  see me back in approximately  two weeks in the office.                                               Nanetta Batty, M.D.    Cordelia Pen  D:  05/16/2003  T:  05/17/2003  Job:  [id number]   cc:   [facility]  [address]  [place], [place] [address]   6th Floor [facility] Suite   Lucila Maine, M.D.  [place], [place]

## 2011-02-08 NOTE — Discharge Summary (Signed)
NAME:  Gavin [name], Gavin [name]                          ACCOUNT NO.:  [id number]   MEDICAL RECORD NO.:  [id number]                   PATIENT TYPE:  OIB   LOCATION:  3728                                 FACILITY:  [facility]   PHYSICIAN:  Nanetta Batty, M.D.                DATE OF BIRTH:  1929/07/24   DATE OF ADMISSION:  05/16/2003  DATE OF DISCHARGE:  05/17/2003                                 DISCHARGE SUMMARY   DISCHARGE DIAGNOSES:  1. Peripheral vascular disease, status post right superficial femoral artery     stenting this admission.  2. Residual 90% focal restenosis of the left superficial femoral artery     stent, ABI is 1.0.  3. Hypertension, suboptimal control, medications adjusted this admission.  4. Negative Cardiolite study in January 2004 with history of minor coronary     disease and catheterization in 1994.   HOSPITAL COURSE:  The patient is a 77 year old male followed by Dr. Allyson Sabal  and Dr. Lorin Picket with a history of peripheral vascular disease.  He underwent  PTA and stenting to the left superficial femoral artery in July 1999.  In  November of 2003, he had an ABI of 0.84 on the right and 0.98 on the left.  He was seen in the office in July and has had some problems with ulcers on  his right foot.  Lower extremity Dopplers done as an outpatient were  abnormal and ABI of 0.76 on the right.  His ABI on the left was 1.0.  He  underwent elective peripheral angiogram which revealed 90% right superficial  femoral artery stenosis, mid vessel.  This was dilated and stented by Dr.  Allyson Sabal.  He also had the finding of a 90% focal stenosis on the left, he does  not have symptoms on that side.  The patient tolerated the procedure well  and will be discharged May 17, 2003.  He was somewhat hypertensive during  this admission and we increased his medicines prior to discharge.   DISCHARGE MEDICATIONS:  1. Cardizem CD 240 mg daily.  2. Lotensin/HCTZ 20/25 daily.  3. Protonix 40 mg daily.  4. Aspirin 81 mg daily.  5. Neurontin 600 mg t.i.d.  6. Claritin 10 mg a day.  7. Diovan 80 mg a day.   LABORATORY DATA:  Sodium 139, potassium 2.7, BUN 14, creatinine 1.2.  White  count 9.6, hemoglobin 14.6, hematocrit 42.6, platelets 266,000.   DISPOSITION:  Patient discharged in stable condition.  He will have Dopplers  done Tuesday, May 24, 2003 at 8 a.m. and will see Dr. Allyson Sabal May 25, 2003 at 2:30 p.m.       Abelino Derrick, P.A.                      Nanetta Batty, M.D.   Lenard Lance  D:  05/17/2003  T:  05/18/2003  Job:  [id number]  cc:   Lucila Maine, M.D.

## 2011-02-08 NOTE — Cardiovascular Report (Signed)
NAME:  Gavin [name], Gavin [name] NO.:  [id number]   MEDICAL RECORD NO.:  [id number]          PATIENT TYPE:  INP   LOCATION:  2807                         FACILITY:  [facility]   PHYSICIAN:  Cristy Hilts. Jacinto Halim, MD       DATE OF BIRTH:  February 28, 1929   DATE OF PROCEDURE:  06/03/2006  DATE OF DISCHARGE:                              CARDIAC CATHETERIZATION   PROCEDURE PERFORMED:  Emergent left heart catheterization including:  1. Left ventriculography.  2. Selective right and left coronary angiography.  3. Percutaneous transluminal coronary angioplasty and stenting of the mid      left anterior descending.   INDICATIONS:  Gavin [name] is a 77 year old gentleman with a history of  paroxysmal atrial fibrillation, history of known coronary artery disease  with stent implantation with a 2.5 by 13 mm Cypher in September 2005 who has  been on Plavix and Coumadin, was admitted to the hospital with severe, acute  onset of chest pain.  His EKG showed diffuse ST segment elevation in V1  through V6 suggestive of a large anterolateral wall myocardial infarction.  Given this, he was brought to the cardiac catheterization lab on an emergent  basis and in spite of not knowing his labs or INR, because of ongoing chest  discomfort and significant EKG changes, an emergent left heart  catheterization was performed.   HEMODYNAMIC DATA:  Left ventricular pressure 151/22 with end diastolic  pressure of 33 mmHg.  The aortic pressure was 167/85 with a mean of 117  mmHg.  There was no pressure gradient across the aortic valve.   ANGIOGRAPHIC DATA:  Left ventricle:  The left ventricular systolic function was markedly  depressed with ejection fraction of 30% with mid to distal anterior and  anterior apical akinesis.  No significant mitral regurgitation was noted.   Right coronary artery:  The right coronary artery is a very large and  dominant vessel.  It has a proximal 90% stenosis.   Left main:  The left  main is large caliber vessel and is smooth and is  normal.   Circumflex:  The circumflex is a moderate caliber vessel.  It has got mild  luminal irregularity.   Left anterior descending:  The left anterior descending  is a large caliber  vessel in its proximal segment.  It gives origin to a very large diagonal  one which has a mid to distal 95% stenosis.  The LAD, itself, has acute  tortuosity in its mid segment and it is completely occluded just at the in  flow of the previously placed Cypher stent.  Distally, the LAD gives origin  to a moderate size diagonal two and the distal LAD, itself, after the origin  of diagonal two has an 80-85% stenosis.  This distal LAD was visualized post  angioplasty.   INTERVENTIONAL DATA:  Successful PTCA and stenting of the mid LAD with a 2.5  by 18 mm Cypher deployed at 16 atmospheres pressure overlapping with the  previously placed stent.  This stent was post dilated with 2.75 by 8 mm  Quantum within the  previously placed stent and also keeping the stent within  the new stent at 16 atmospheres pressure again.  Overall, the stenosis was  reduced from 100% to 0% with TIMI 0 to TIMI III flow at the end of the  procedure.   RECOMMENDATIONS:  The patient's labs will be followed up.  I suspect his INR  is probably high.  His Coumadin will be held.  Once his INR becomes  subtherapeutic, we will start him back on heparin.  He does need angioplasty  to a high grade diagonal and RCA prior to his hospital discharge.   A total of 180 mL of contrast was utilized for diagnostic angiography.   TECHNIQUES OF PROCEDURE:  In the usual sterile technique, using a 6 French  right femoral artery access and a 5 French right femoral venous access, left  heart catheterization was performed using the arterial access.  The 6 [place]  multipurpose B2 catheter was advanced to the ascending aorta over a 0.035  inch J-wire.  The catheter was gently advanced to the left  ventricle.  Left  ventricular pressure was monitored.  Hand contrast injection of the left  ventricle was performed in the RAO projection.  The catheter was flushed  with saline and pulled back into the ascending aorta and pressure gradient  across the aortic valve was monitored.  The right coronary artery was  selectively engaged and angiography was performed.  Then, the left main  coronary artery was selectively engaged and angiography was performed.  Then, the catheter was pulled from the body in the usual fashion.   TECHNIQUES OF INTERVENTION:  Using Angiomax for anticoagulation, a 190 cm by  0.014 inch Asahi Prowater wire was advanced into the LAD and carefully  placed into the diagonal two vessel which was larger than the distal LAD.  Then, I performed a balloon inflation with a 2.5 by 15 mm Maverick at 10  atmospheres pressure.  Post balloon angioplasty TIMI III flow was  established.  Even prior to balloon angioplasty, after I had wired the  vessel, the stenosis had improved from 90% to around 60%.  There was clearly  visible segment at the in flow of the stent.  Then, I proceeded with  stenting with a 2.5 by 18 mm Cypher.  With difficulty, I was able to advance  the stent because of tortuosity into the in flow of the previously placed  stent and overlapping with the previously placed stent.  I deployed the  stent proximally to the previously placed stent at 16 atmospheres pressure  for 45 seconds and, again, at 16 atmospheres pressure for about 30 seconds.  200 mcg intracoronary nitroglycerin was administered and angiography was  repeated.  The stent was post dilated with a 2.75 by 8 mm Quantum.  Post  balloon inflation, I was able to easily advance the same balloon from the  proximal LAD into the distal LAD without any difficulty even though there  was significant tortuosity.  After having confirmed good results, the guide- wire was withdrawn, angiography repeated, and the  guiding catheter pulled  out of the body in the usual fashion.  Right femoral angiography was  performed and the arterial access sheath was closed with Starclose, however,  because of mild ooze which was probably secondary to high INR, a fem-stop  was applied and the patient was transferred to the intensive care unit in  stable condition.  The patient tolerated the procedure well.  No immediate  complications.  Cristy Hilts. Jacinto Halim, MD  Electronically Signed     JRG/MEDQ  D:  06/03/2006  T:  06/04/2006  Job:  [id number]   cc:   Lucila Maine, M.D.  Nanetta Batty, M.D.

## 2011-02-08 NOTE — Discharge Summary (Signed)
NAME:  Gavin [name], Gavin [name]                ACCOUNT NO.:  [id number]   MEDICAL RECORD NO.:  [id number]          PATIENT TYPE:  INP   LOCATION:  2006                         FACILITY:  [facility]   PHYSICIAN:  Raymon Mutton, P.A. DATE OF BIRTH:  1928-10-22   DATE OF ADMISSION:  06/03/2006  DATE OF DISCHARGE:                                 DISCHARGE SUMMARY   DATE OF ADMISSION:  June 03, 2006   DATE OF DISCHARGE:  June 09, 2006   ATTENDING PHYSICIAN:  Delrae Rend, M.D.   DISCHARGE DIAGNOSES:  1. Status post acute myocardial infarction during this admission, with      patient undergoing cardiac catheterization x2 during this admission      with angioplasty to right carotid artery to left anterior descending      and diagonal branch to left anterior descending.  2. Known coronary artery disease with previous interventions known.  3. Peripheral vascular disease and status post angioplasty to left      superficial femoral artery in 1999, right superficial femoral artery in      2006.  4. Paroxysmal atrial fibrillation.  5. Hypertension.  6. Hyperlipidemia.  7. Benign prostatic hypertrophy.  8. Left ventricular dysfunction with an ejection fraction 30%.  9. Obstructive sleep apnea.  10.Status post unsustained ventricular tachycardia during this admission.      No recurrence.  Maintained sinus rhythm.   HOSPITAL PROCEDURES:  1. Cardiac catheterization number one performed by Dr. Jacinto Halim on June 03, 2006.  During that cath, patient underwent angioplasty and CYPHER      stent deployment to the mid LAD with the reduction of stenosis from 100      to 0%.  2. Coronary intervention number 2 performed by Dr. Elsie Lincoln and during this      second cath, the CYPHER stent was deployed to the RCA with a proximal      portion of the RCA with reduction of the lesion from 90% to 0% and the      second site of the RCA with overlapping CYPHER stent was also      successful with the  reduction of the lesion 90% to 0%.  During this      time, patient had an angioplasty to the diagonal branch of the LAD and      lesion also reduced from 90% to 0%.  Angioplasty with Quantum Maverick      balloon.   HOSPITAL COURSE:  This is a 77 year old Caucasian gentleman, patient of Dr.  Allyson Sabal, who has a history of paroxysmal atrial fibrillation, known history of  coronary disease with previous intervention in 2005, who developed a very  acute onset of chest pain and EKG revealed diffuse ST-T wave segment  elevation in V1-V6 suggestive of anterior wall myocardial infarction.  He  was moved to the cardiac cath lab on emergent basis and underwent cardiac  catheterization and CYPHER stenting of the midportion of the LAD.  He  tolerated the procedure well, and after that was under observation on the  floor awaiting for the staged  PCI.   During this time, the telemetry revealed episode of nonsustained DVT and we  increased his Lopressor.  We increased his Toprol from 75 to 100 mg daily.  On June 06, 2006, patient underwent another coronary angiography and  PCI to proximal RCA and diagonal branch of the LAD performed by Dr. Elsie Lincoln.  The procedure was carried out without any immediate or distant  complications.  Patient tolerated it well.  He was in the hospital for a  couple of days after that to make sure his cardiac enzymes are decreasing  and renal function is stable.  On June 09, 2006, he was discharged by  Dr. Jacinto Halim and felt to be stable for discharge home.   HOSPITAL LABORATORIES:  His INR the day of discharge was 1.1.  His initial  cardiac panel revealed CK of 1090, CK-MB 181.1 and troponin 22.41.  Cardiac  panel at the time of discharge revealed a CK 75, CK-MB 3.1, troponin 1.45,  which is significant improvement.  BMP revealed sodium 141, potassium 4.1,  chloride 106, CO2 28, BUN 12, creatinine 1, glucose 101.  Laboratories  revealed white blood count 8.9, hemoglobin  14.2, hematocrit 41.4, and white  blood cell count 150.   Lipid profile showed cholesterol total 116, triglycerides 69, HDL 46, LDL  56.   DISCHARGE MEDICATIONS:  1. Lipitor 40 mg daily.  2. Prilosec 20 mg daily.  3. Aspirin 325 mg daily.  4. Plavix 75 mg daily.  5. Vasotec 20 mg daily.  6. Toprol XL 100 mg daily.  7. Flomax 0.4 mg daily.  8. Inspra 25 mg daily.  9. Cardizem 10 mg daily.  10.Coumadin 5 mg daily, except 2.5 mg Monday, Wednesday, Friday.   DISCHARGE DIET:  Low-salt, low-fat, low-cholesterol diet.   DISCHARGE ACTIVITIES:  No driving, no lifting greater than 5 pounds for 5  days after the last cath.   DISCHARGE FOLLOWUP:  He is to check a pro time on Friday, June 13, 2006, and Dr. Allyson Sabal will see patient in our office on June 17, 2006, at  10:45 a.m.   ADDENDUM:  Patient needs to stay on aspirin, Plavix and Coumadin therapy for  a month, and then his aspirin can be discontinued.  He is to take Plavix and  Coumadin from that time on.      Raymon Mutton, P.A.     MK/MEDQ  D:  06/09/2006  T:  06/09/2006  Job:  [id number]   cc:   Lucila Maine, M.D.  Gery Pray

## 2011-02-08 NOTE — Cardiovascular Report (Signed)
NAME:  JULIO, ZAPPIA NO.:  [id number]   MEDICAL RECORD NO.:  [id number]          PATIENT TYPE:  INP   LOCATION:  2908                         FACILITY:  [facility]   PHYSICIAN:  Nanetta Batty, M.D.   DATE OF BIRTH:  [birth year]   DATE OF PROCEDURE:  06/13/2004  DATE OF DISCHARGE:                              CARDIAC CATHETERIZATION   INDICATIONS:  Mr. Honor is a 77 year old married white male with history of  PVOD status post SFA PTA and stenting in the past.  His other problems  include controlled hypertension.  He is admitted to [facility] two  days ago with chest pain.  He had minimally elevated troponins.  He was  transferred to [facility] yesterday afternoon.  He flipped his T-waves in the  interim and was on IV heparin, nitro and integrilin, but remained pain free  overnight until this morning.  He presents now for diagnostic coronary  arteriography and intervention.   PROCEDURE DESCRIPTION:  The patient was brought to the second floor [facility] in the postabsorptive state.  He was  premedicated with p.o. Valium and IV Nubain and Versed.  His right groin was  prepped and shaved in the usual sterile fashion.  1% Xylocaine was used for  local anesthesia.  A 6 French sheath was inserted into the right femoral  artery using standard Seldinger technique. A 6 French right and left Judkins  diagnostic catheter as well as 6 French pigtail catheter were used for  selective coronary angiography, left ventriculography, subselective left  internal mammary artery angiography and distal abdominal aortography.  Visipaque dye was used for the entirety of the case. Retrograde aortic,  ventricular pullback pressures were recorded.   HEMODYNAMICS:  1.  Aortic systolic pressure 113, diastolic pressure 53.  2.  Left ventricular systolic pressure 113, end-diastolic pressure 27.   SELECTIVE CORONARY ANGIOGRAPHY:  1.  Left main:  Left main was a long  tubular vessel and was free of      significant disease.  2.  LAD:  The LAD was the culprit vessel.  Gave off a large first diagonal      branch which had an 80% stenosis on the bend in the mid portion.  The      continuation of the LAD in the mid portion had a 95% ulcerated-appearing      lesion on the bend after the second small diagonal branch.  The distal      LAD bifurcated.  The inferior bifurcation had diffuse 80-90% segmental      stenosis.  3.  Left circumflex:  Nondominant and free of significant disease.  4.  Right coronary artery:  Large, dominant vessel with 34% proximal      stenosis on a bend.   LEFT VENTRICULOGRAPHY:  RAO left ventriculogram was performed using 25 mL of  Visipaque dye at 12 mL per second.  The overall LVEF was estimated at  approximately 50% with mild anteroapical hypokinesia.   LEFT INTERNAL MAMMARY ARTERY:  This vessel was subselectively visualized and  was suitable for use during  coronary artery bypass grafting.  There was an  incidentally noted 40% proximal left subclavian artery stenosis without a  gradient.   DISTAL ABDOMINAL AORTOGRAPHY:  Distal abdominal aortogram was performed  using 20 mL of Visipaque dye at 20 mL per second.  The renal arteries were  widely patent. The infrarenal abdominal aorta and iliac bifurcation appear  free of significant atherosclerotic changes.   IMPRESSION:  Mr. Cavallaro has high grade left anterior descending and diagonal  disease with a non-Q-wave myocardial infarction and dynamic anterolateral T-  wave inversion.  Will proceed with PCI and stenting using glycoprotein 2b3a  inhibition.   PROCEDURE DESCRIPTION:  The existing 6 French sheath in the right femoral  artery was exchanged over wire for a 7 [place] sheath.  A 6 French sheath was  then inserted into the right femoral vein.  Using a 7 [place] left 3.5 Voda  catheter along w 0.014 190 support guide wire and a 2.25 x 6-mm long cutting  balloon, cutting balloon  atherectomy was performed of the mid LAD lesion at  nominal pressures.  Following this, a 2.0 20 Maverick was then advanced down  the LAD into the distal segment after the bifurcation and a 90 second  nominal pressure inflation was performed.  200 mcg of intracoronary  nitroglycerin was then administered twice during the case.  A 2.5 x 13  Cypher was then deployed in the mid LAD at 16 atmospheres (2.7 mm) resulting  in reduction of a 95% mid stenosis to 0% residual and 90% segmental distal  stenosis less than 20% without dissection.  There is TIMI-3 flow during the  case.  The patient denied chest pain.  There are no electrocardiographic or  hemodynamic changes.   OVERALL IMPRESSION:  Successful cutting balloon atherectomy/PCI and stenting  of the mid  LAD and POBA of the distal LAD resulting in excellent  angiographic result.  The patient has residual diagonal branch disease,  however.  Because of the length of the procedure, the amount of dye used,  the patient's discomfort in his back, it was elected to terminate the  procedure and plan elective staged intervention in the future.   ACT was measured at 176 and additional 3000 units of heparin was given in  addition to the 4000 initially given.  The patient was on integrilin during  the procedure and had received aspirin prior to the procedure as well as 600  mg of p.o. Plavix prior to intervention.  Guide wire and catheters removed.  The sheath was sewn securely in place.  The patient left the lab in stable  condition.  He will be observed overnight and enzymes will be cycled.  He  will be discharged home in the next 24-48 hours.  Left the lab in stable  condition.       JB/MEDQ  D:  06/13/2004  T:  06/14/2004  Job:  [id number]   cc:   Lucila Maine

## 2011-02-08 NOTE — Discharge Summary (Signed)
NAME:  KNOXX, BOEDING NO.:  [id number]   MEDICAL RECORD NO.:  [id number]          PATIENT TYPE:  INP   LOCATION:  2036                         FACILITY:  [facility]   PHYSICIAN:  Cristy Hilts. Jacinto Halim, M.D.     DATE OF BIRTH:  11-18-1928   DATE OF ADMISSION:  06/12/2004  DATE OF DISCHARGE:  06/15/2004                                 DISCHARGE SUMMARY   Mr. Keyaan Lederman is a 77 year old married white male patient of Dr. Nanetta Batty with known mild coronary disease and peripheral vascular disease who  was transferred from [facility] after going there with chest pain.  He had elevated troponins.  Thus, he was transferred for further cardiac  work-up.  He was seen by Dr. Nanetta Batty, his primary cardiologist, and  it was decided he should under cardiac catheterization.  This was performed  on June 13, 2004.  He had an LAD lesion, mid, after his diagonal I  reduced from 91 to zero.  He had another lesion after his diagonal II which  underwent a PTCA reduced from 80 down to less than 20.  He had an RCA  proximal lesion 30 to 40% and he had another 80% lesion on a side branch of  his LAD.  His EF was 50%.  He also had a 40% proximal left subclavian artery  stenosis.  He did well after the procedure.  He had no further chest pain.  He received IV Integrilin and IV nitroglycerin, these were both off by the  morning of June 14, 2004.  His medications were adjusted for MI, CAD.   The morning of June 15, 2004, his blood pressure was 142/60, his pulse  was 80, respiratory rate 20, temperature 97.7 and O2 saturations were 97%.  His last EKG done June 14, 2004, showed evolving anterior lateral MI  with T-wave inversions from V2 through V6.  His labs on the morning of  June 15, 2004, showed a hemoglobin of 13.7, hematocrit 39.8, wbc 12.8,  platelets 279.  Sodium 139, potassium 3.7, BUN 17, creatinine 1.2, glucose  101.  His CK-MB was 160/1.4, troponin  0.59.  Lipids showed total cholesterol  of 170, triglycerides of 170, HDL 39, LDL 97.  His peak CK-MB at [facility].  [facility] on June 12, 2004, at 5:20 his CK was 461, MB was  10.9 and troponin was 1.23.   DISCHARGE MEDICATIONS:  1.  Verapamil SR 240 mg once a day.  2.  Neurontin 600 mg two times per day.  3.  Plavix 75 mg one time per day. He was told not to stop for any reason.  4.  Toprol XL 50 mg once a day.  5.  Lipitor 20 mg once a day.  6.  Vasotec 20 mg once a day.  7.  Aspirin 81 mg once a day.  8.  Teveten 600 mg once a day.  9.  Prevacid 30 mg once a day.  10. Aldactone 12.5 mg once a day.  11. Nitroglycerin 1/150 under the tongue when needed for chest pain.  No strenuous activity, lifting, no driving and no preaching until he is seen  by Dr. Allyson Sabal after June 28, 2004.   He should be on a low saturated fat, low trans-fatty acid diet.   If he has any problems with his groin, he should give [place] a call.  He should  walk day as advised by cardiac rehab.  He will follow up with Dr. Allyson Sabal on  June 28, 2004, at 3 p.m.  He is planning to go to cardiac rehab phase II  program in [place], [place].   DISCHARGE DIAGNOSES:  1.  Anterior myocardial infarction with cardiac catheterization revealing      left anterior descending stenosis high grade.  He underwent CYPHER      stenting with a 2.5 x 13 to his mid left anterior descending after      diagonal I and he had a percutaneous transluminal coronary angioplasty      of another lesion in his left anterior descending reduced from 80 to      less than 20 after his diagonal II.  He does have residual disease in a      side branch of his left anterior descending and also a 30 to 40% lesion      in his proximal right coronary artery.  2.  Ejection fraction of 50% with mild hypokinesis.  3.  Atherosclerotic cardiovascular disease.  Catheterization revealing a 40%      proximal left subclavian artery  stenosis, previous known peripheral      vascular disease with history of right superficial femoral artery stent      done for critical limb ischemia and limb salvage on May 16, 2003.  He      has also had previous left superficial femoral artery stent placed.  He      was noted to have 90% in-stent restenosis of his left superficial      femoral artery stent on May 24, 2004, but review of his chart      showed he had improved ABIs and he has been asymptomatic.  4.  Hypertension, on multiple medications, difficult to control with normal      renal arteries by catheterization June 13, 2004.  5.  Hyperlipidemia.  He was not on a statin agent prior to admission.  This      has been followed by Dr. Lorin Picket.  His LDL was 97.  We are recommending it      to  be less than 70 with known coronary disease, status post myocardial      infarction thus he has been placed on a statin.  6.  Erectile dysfunction with prior use of Viagra.  7.  Right groin ecchymosis status post catheterization, stable at discharge.  8.  History of paroxysmal atrial fibrillation.       BB/MEDQ  D:  06/15/2004  T:  06/16/2004  Job:  [id number]   cc:   Lucila Maine, M.D.

## 2011-02-08 NOTE — Cardiovascular Report (Signed)
NAME:  [name], [name] NO.:  [id number]   MEDICAL RECORD NO.:  [id number]          PATIENT TYPE:  INP   LOCATION:  2911                         FACILITY:  [facility]   PHYSICIAN:  Madaline Savage, M.D.DATE OF BIRTH:  11-21-1928   DATE OF PROCEDURE:  06/06/2006  DATE OF DISCHARGE:                              CARDIAC CATHETERIZATION   PROCEDURES PERFORMED:  1. Proximal and ostial right coronary artery stenting for 90% lesions at      both sites reduced to 0% residual.  2. Ostial diagonal stand alone primary balloon angioplasty for a jailed      ostium of diagonal branch one created by an left anterior descending      stent placed June 03, 2006, with reduction of a 75% stenosis to 0%      residual.  3. Successful primary stenting of the mid diagonal branch number one with      a chromium cobalt microdriver stent 2.5 x 12 with reduction lesion from      90% to 0.   MEDICATIONS USED:  Angiomax, fentanyl, Versed, IV fluids.   COMPLICATIONS:  None.   ENTRY SITE:  Right femoral.   DYE USED:  Omnipaque.   PATIENT PROFILE:  Gavin [name] is a 77 year old gentleman who had  undergone primary intracoronary artery stenting of his mid left anterior  descending coronary artery by Dr. Jacinto Halim on June 03, 2006, for acute ST-  segment elevation MI.  At the time of that procedure, Dr. Jacinto Halim identified  two additional lesions, one in the mid and diagonal branch of the LAD and a  second in the proximal RCA that would need a staged procedure, so that was  done today June 06, 2006, three days later.  Today's procedure was  technically difficult but the results were gratifying.   DESCRIPTION OF THE PROCEDURE:  Right coronary artery angiogram showed a pair  of lesions in the proximal RCA, one 95% in the downgoing limb of the  proximal RCA and a second lesion of 60% and a lesion that could not be seen  which was ostial except after placement of the first stent.   The distal  vessel was widely patent throughout and was 3.5 mm proximally 3 mm distal.   A Cordis 6-French guide catheter which was a 6-French Cordis DRC.  That  provided much better guide support.  We used a Research scientist (physical sciences) and we were  able to stent the proximal two lesions with a 3.5 x 18 mm Cypher stent, but  we then we realized that we had an ostial stenosis that may have been even  created by plaque shift from the successfully deployed stent.  I then put an  8 mm by 3.5 Cypher stent overlapped with the first and now we had a smooth  vessel with two overlapping Cypher stents equivalent to 26 mm of overlapped  stent with TIMI III flow and pristine appearance with reduction of 60% and  90% lesions to 0.   The second major procedure this morning was coronary angiography of the LAD  and diagonal  which showed patent stents in the mid-LAD but did show a 90%  stenosis in a large diagonal branch.  The guide catheter used was a 7-French  Cordis FL4 without sideholes.  The guidewire used was a Forensic psychologist.  I later  used a buddy wire which was a [employer] for advancing balloons and stents.  It was  realized when there was an attempt to place a stent down the diagonal that  there was jailing of the ostial diagonal by the LAD stent placed June 03, 2006.  After balloon angioplasty with a Maverick balloon and then a  Quantum Maverick, I was able to create a reduction of what appeared to be  roughly a 75% area of jailing type stenosis in the ostial diagonal to 0%  residual.  I was then able to use a microdriver 2.5 x 12 deploying that to  maximal inflation pressure twice and we are able to receive a pristine  result of 90% lesion reduced to 0% residual with preservation of TIMI III  distal flow.  No complications occurred.  The patient received three bottles  of dye which were 125, 125, and 50 for 300 mL total.  The patient will be  hydrated after this procedure.   FINAL DIAGNOSIS:  1. Successful  double stenting of the ostial and proximal right coronary      artery with reduction of 90% lesions to 0% residual.  2. Successful primary balloon angioplasty of the ostium of the major      diagonal branch due to jailing by a previously placed left anterior      descending stent.  3. Successful mid diagonal branch primary stenting for a 90% lesion      reduced to 0% residual.           ______________________________  Madaline Savage, M.D.     WHG/MEDQ  D:  06/06/2006  T:  06/07/2006  Job:  [id number]   cc:   Nanetta Batty, M.D.

## 2011-02-08 NOTE — H&P (Signed)
NAME:  DALAN, COWGER NO.:  [id number]   MEDICAL RECORD NO.:  [id number]          PATIENT TYPE:  INP   LOCATION:  2911                         FACILITY:  [facility]   PHYSICIAN:  Cristy Hilts. Jacinto Halim, MD       DATE OF BIRTH:  04/21/1929   DATE OF ADMISSION:  06/03/2006  DATE OF DISCHARGE:                                HISTORY & PHYSICAL   CHIEF COMPLAINT:  Chest pain.   HISTORY OF PRESENT ILLNESS:  Mr. Wiltsey is a 77 year old male with a history  of coronary disease.  He had an anterior MI September 2005, which was  treated with LAD Cypher stenting and distal LAD PTCA and second diagonal  PTCA.  His EF at that time was 50%.  He had a Cardiolite study in July 2007,  which was negative for ischemia.  His last office visit was in March 2007.  In March he was noted to have asymptomatic PAF while at cardiac rehab.  Coumadin was started by Dr. Allyson Sabal in March.  The patient has been doing  well.  He has been at cardiac rehab.  Yesterday he went to cardiac rehab,  came home and took a shower.  As he was getting dressed he developed severe  substernal chest pain with diaphoresis.  He was taken to [facility] from [place]  via EMS.  En route he had ST elevation in the anterior leads and was taken  directly to the catheterization lab for urgent catheterization.   PAST MEDICAL HISTORY:  1. Remarkable for peripheral vascular disease.  He had a left SFA PTA in      1999, a right SFA PTA in August 2004.  In August 2004 it was noted that      his left SFA had an 80% occlusion, but he was asymptomatic.  2. He has a history of hypertension.  3. BPH.  4. Dyslipidemia.  5. PAF.  6. He is status post remote rotator cuff repair.   MEDICATIONS:  1. Flomax 0.4 mg a day.  2. Lipitor 40 mg a day.  3. Prilosec daily.  4. Teveten/HCTZ 60/12.5 mg.  5. Toprol XL 75 mg a day.  6. Vasotec 20 mg a day.  7. Verapamil 240 mg a day.  8. Aspirin daily.  9. Claritin.   He is intolerant to Micardis, codeine  and Cipro.   SOCIAL HISTORY:  He is married.  He has three children.  He is a nonsmoker  and does not drink alcohol.   FAMILY HISTORY:  Unremarkable for early coronary disease, although both his  parents died of coronary disease, his mother died at 32, his father at 47.   REVIEW OF SYSTEMS:  Essentially unremarkable except for noted above.  He has  not had GI bleeding or melena.   PHYSICAL EXAMINATION:  VITAL SIGNS:  On admission to the catheterization  lab, his blood pressure 142/80, pulse 72, respirations 12, temperature 96.  GENERAL:  He is a well-developed, well-nourished male in no acute distress.  HEENT:  Normocephalic.  Extraocular movements are intact.  Sclerae are  anicteric.  NECK:  Without JVD and without bruit.  CHEST:  Clear to auscultation and percussion.  CARDIAC:  A regular rate and rhythm without obvious murmur or rub, normal  S1, S2.  ABDOMEN:  Soft, nontender.  No hepatosplenomegaly.  EXTREMITIES:  Without edema.  NEUROLOGIC:  Grossly intact.  He is awake, alert and oriented and  cooperative.  SKIN:  Cool and dry.  LYMPHATIC:  There is no obvious lymphadenopathy noted.   EKG shows sinus rhythm, sinus tachycardia  with ST elevation V1 through V6.  Labs are pending.   IMPRESSION:  1. Acute anterior myocardial infarction.  2. Known coronary disease with an anterior myocardial infarction treated      with left anterior descending artery percutaneous coronary intervention      and diagonal percutaneous coronary intervention in September 2005, with      good left ventricular function at that time.  3. Paroxysmal atrial fibrillation, Coumadin started March 2007.  4. Peripheral vascular disease with previous bilateral superficial femoral      artery percutaneous transluminal angioplasty with subsequent in-stent      restenosis of the left superficial femoral artery but asymptomatic.  5. Treated hypertension.  6. Treated dyslipidemia.  7. Benign prostatic  hypertrophy.   PLAN:  The patient was admitted urgently to the catheterization lab for  acute intervention.      Abelino Derrick, P.A.      Cristy Hilts. Jacinto Halim, MD  Electronically Signed    LKK/MEDQ  D:  06/04/2006  T:  06/04/2006  Job:  [id number]

## 2011-03-01 ENCOUNTER — Encounter (HOSPITAL_BASED_OUTPATIENT_CLINIC_OR_DEPARTMENT_OTHER): Payer: Medicare Other | Attending: Plastic Surgery

## 2011-03-01 DIAGNOSIS — L97509 Non-pressure chronic ulcer of other part of unspecified foot with unspecified severity: Secondary | ICD-10-CM | POA: Insufficient documentation

## 2011-03-01 DIAGNOSIS — Z7982 Long term (current) use of aspirin: Secondary | ICD-10-CM | POA: Insufficient documentation

## 2011-03-01 DIAGNOSIS — Z8673 Personal history of transient ischemic attack (TIA), and cerebral infarction without residual deficits: Secondary | ICD-10-CM | POA: Insufficient documentation

## 2011-03-01 DIAGNOSIS — Z7901 Long term (current) use of anticoagulants: Secondary | ICD-10-CM | POA: Insufficient documentation

## 2011-03-01 DIAGNOSIS — L97209 Non-pressure chronic ulcer of unspecified calf with unspecified severity: Secondary | ICD-10-CM | POA: Insufficient documentation

## 2011-03-01 DIAGNOSIS — I739 Peripheral vascular disease, unspecified: Secondary | ICD-10-CM | POA: Insufficient documentation

## 2011-03-01 DIAGNOSIS — Z79899 Other long term (current) drug therapy: Secondary | ICD-10-CM | POA: Insufficient documentation

## 2011-03-01 DIAGNOSIS — I10 Essential (primary) hypertension: Secondary | ICD-10-CM | POA: Insufficient documentation

## 2011-03-01 DIAGNOSIS — I2510 Atherosclerotic heart disease of native coronary artery without angina pectoris: Secondary | ICD-10-CM | POA: Insufficient documentation

## 2011-03-13 NOTE — Assessment & Plan Note (Unsigned)
[facility]  NAME:  Gavin [name], Gavin [name] NO.:  [id number]  MEDICAL RECORD NO.:  [id number]      DATE OF BIRTH:  1929-03-27  PHYSICIAN:  Wayland Denis, DO            VISIT DATE:                                  OFFICE VISIT   Gavin [name] is an 77 year old white male here with his wife for reevaluation of his right lower extremity wound.  He has been using Oasis on the leg portion which is actually getting better and epithelializing.  There is no sign of infection and it is improving. The fifth toe lateral aspect has not changed much.  He was evaluated by orthopedist and there does not seem to be anything but arthritis.  There does not seem to be any osteo or anything.  SOCIAL HISTORY:  Unchanged.  Family history of medications.  In review of his lab work from December, his prealbumin was 27 and his hemoglobin A1c was slightly high.  We will recheck his prealbumin to see if his nutritional status has improved or at least is within normal still.  He has lost some weight and has a loss of his appetite, so I would like to make sure that is not a part of this nonhealing.  PHYSICAL EXAMINATION:  GENERAL:  He is alert, oriented, cooperative in no acute distress.  He is very pleasant. LUNGS:  Clear. HEART:  Regular. ABDOMEN:  Soft. EXTREMITIES:  Lower extremity wound as described.  Plan to continue with Oasis to the right leg wound and the foot.  We will add Prisma and hydrogel with the wrap.  He needs to keep elevating and work on his nutritional status and we will see him back in a week.     Wayland Denis, DO     CS/MEDQ  D:  03/13/2011  T:  03/13/2011  Job:  [id number]

## 2011-03-20 NOTE — Assessment & Plan Note (Signed)
[facility]  NAME:  Gavin [name], [name]:  [id number]  MEDICAL RECORD [name]:  [id number]      DATE OF BIRTH:  [birth year]  PHYSICIAN:  Wayland Denis, DO       VISIT DATE:  03/20/2011                                  OFFICE VISIT   Gavin [name] is an 77 year old white male, here with his wife for right lower extremity ulcer on the medial aspect that has completely healed and epithelialized.  The ulcer on the lateral aspect of his fifth toe is unchanged.  He went to see Dr. Luiz [name], who felt like it was likely due to pressure, and that if the pressure was relieved that we would start to see some improvement.  He did give a plan if that does not heal what we can do and I do agree with him.  There has been [name] change in his social or family history.  MEDICATIONS:  Are the same.  PHYSICAL EXAMINATION:  GENERAL:  He is alert, oriented, cooperative, in [name] acute distress, very pleasant as usual.  He is very pleased with the healing of the right medial leg wound. HEENT:  His pupils are equal.  Extraocular muscles are intact.  [name] cervical lymphadenopathy. LUNGS:  Clear. HEART:  Regular. ABDOMEN:  Soft.  Prominent veins unchanged. EXTREMITIES:  His lower extremity wound is as described and noted in the nurse's notes.  We will plan on a triple antibiotic ointment on foot wound with Dial soap, soaks for 5-10 minutes per day, and compression hose with wool over the ulcer to alleviate the pressure and we will see them back in 2 weeks.  We are awaiting on lab work as well and I will review that as soon as I got it.     Wayland Denis, DO     CS/MEDQ  D:  03/20/2011  T:  03/20/2011  Job:  [id number]

## 2011-04-03 ENCOUNTER — Other Ambulatory Visit (HOSPITAL_COMMUNITY): Payer: Self-pay | Admitting: Plastic Surgery

## 2011-04-03 ENCOUNTER — Encounter (HOSPITAL_BASED_OUTPATIENT_CLINIC_OR_DEPARTMENT_OTHER): Payer: Medicare Other | Attending: Plastic Surgery

## 2011-04-03 DIAGNOSIS — M869 Osteomyelitis, unspecified: Secondary | ICD-10-CM

## 2011-04-03 DIAGNOSIS — R609 Edema, unspecified: Secondary | ICD-10-CM

## 2011-04-03 DIAGNOSIS — L97509 Non-pressure chronic ulcer of other part of unspecified foot with unspecified severity: Secondary | ICD-10-CM | POA: Insufficient documentation

## 2011-04-03 DIAGNOSIS — R52 Pain, unspecified: Secondary | ICD-10-CM

## 2011-04-03 NOTE — Progress Notes (Signed)
[facility]  NAME:  Gavin [name], Gavin [name] NO.:  [id number]  MEDICAL RECORD NO.:  [id number]      DATE OF BIRTH:  [birth year]  PHYSICIAN:  Wayland Denis, DO            VISIT DATE:                                  OFFICE VISIT   Mr. Vorndran is here with his wife.  He is 77 years old and is being reevaluated for right lower extremity ulcer on the lateral aspect of his fifth toe.  He has seen Vascular Surgery and is being worked up for possible minimally invasive bypass.  His pain is still out of proportion to the actual appearance of the wound.  The wound is getting better. There does not appear to be any sign of infection.  It is getting smaller and closing but his pain is still very high.  In review his x- rays were done a couple of months ago which did not show any pathology, so I do not have an explanation for his level of pain.  This may be arthritis in nature but I do want to rule out osteomyelitis.  The other wound that we are seeing for is completely healed.  He is still at home, no change in his social history or review of systems.  On exam he is alert and oriented, cooperative in no acute distress.  He is very pleasant.  His extraocular muscles are intact.  No cervical lymphadenopathy.  His lungs are clear.  His heart is regular.  Abdomen is soft.  His wound is as described.  Swelling in his leg is improved. Plan is to continue triple antibiotic ointment to keep pressure off that area to keep his foot elevated to continue with healthy eating.  His prealbumin came back at 84 which is good.  We will also send him for an MRI to rule out osteomyelitis and we will consider a rheumatology consult depending on the results.     Wayland Denis, DO     CS/MEDQ  D:  04/03/2011  T:  04/03/2011  Job:  [id number]

## 2011-04-05 ENCOUNTER — Ambulatory Visit (HOSPITAL_COMMUNITY)
Admission: RE | Admit: 2011-04-05 | Discharge: 2011-04-05 | Disposition: A | Discharge status: Home / Self Care | Payer: Medicare Other | Source: Ambulatory Visit | Attending: Plastic Surgery | Admitting: Plastic Surgery

## 2011-04-05 DIAGNOSIS — R52 Pain, unspecified: Secondary | ICD-10-CM

## 2011-04-05 DIAGNOSIS — R609 Edema, unspecified: Secondary | ICD-10-CM | POA: Insufficient documentation

## 2011-04-05 DIAGNOSIS — M79609 Pain in unspecified limb: Secondary | ICD-10-CM | POA: Insufficient documentation

## 2011-04-05 DIAGNOSIS — M869 Osteomyelitis, unspecified: Secondary | ICD-10-CM

## 2011-04-05 DIAGNOSIS — R937 Abnormal findings on diagnostic imaging of other parts of musculoskeletal system: Secondary | ICD-10-CM | POA: Insufficient documentation

## 2011-04-05 MED ORDER — GADOBENATE DIMEGLUMINE 529 MG/ML IV SOLN
18.0000 mL | Freq: Once | INTRAVENOUS | Status: AC | PRN
  Administered 2011-04-05: 18 mL via INTRAVENOUS

## 2011-04-10 NOTE — Progress Notes (Signed)
[facility]  NAME:  BRIGG, CAPE                     ACCOUNT NO.:  MEDICAL RECORD NO.:  [id number]      DATE OF BIRTH:  [birth year]  PHYSICIAN:  Wayland Denis, DO       VISIT DATE:  04/10/2011                                  OFFICE VISIT   Mr. Gavin [name] is an 77 year old white male who is here with his wife for evaluation of his right lower extremity lateral foot ulcer at the base of his fifth toe.  He had another wound that he was here for, which has healed.  This wound came up subsequently and the biggest problem is pain.  He is extremely sensitive to any light touch or movement.  The wound is looking a little bit better, is a very small ulcer, does not look infected but the tissue is tender.  There is some swelling.  The redness is better than it was last week.  Recent lab work showed some mild increase in BUN and CO2, but otherwise within normal limits and that was from April 03, 2011.  The MRI was not concerning for osteomyelitis, but possible bone spur overgrowth.  There has been no change in his medications or his social history. There has been no recent illness.  REVIEW OF SYSTEMS:  Negative.  PHYSICAL EXAMINATION:  GENERAL:  He is alert, oriented, cooperative. HEENT:  Pupils are equal.  Extraocular muscles are intact. LUNGS:  Clear. HEART:  Regular. ABDOMEN:  Soft.  He is wearing compression hose with the toes cut out. The redness is better.  The swelling is a little bit worse.  He says it was much better yesterday and got swollen this morning.  He has got good capillary refill.  There is no sign of infection.  It is still tender to touch.  The wound is noted in the nurse's note.  ASSESSMENT:  Right lower extremity foot wound.  PLAN:  To continue triple antibiotic ointment, compression, elevation, multivitamin, daily washing and I would like to have him see Dr. Victorino Dike for evaluation to see if we need to have the bone spur removed if there is in  fact one in excess and we will wait to hear back from him and we will see the patient back in 2 weeks.     Wayland Denis, DO     CS/MEDQ  D:  04/10/2011  T:  04/10/2011  Job:  [id number]

## 2011-04-24 ENCOUNTER — Encounter (HOSPITAL_BASED_OUTPATIENT_CLINIC_OR_DEPARTMENT_OTHER): Payer: Medicare Other | Attending: Plastic Surgery

## 2011-04-24 DIAGNOSIS — L97509 Non-pressure chronic ulcer of other part of unspecified foot with unspecified severity: Secondary | ICD-10-CM | POA: Insufficient documentation

## 2011-04-24 NOTE — Progress Notes (Signed)
[facility]  NAME:  ROBB, SIBAL NO.:  [id number]  MEDICAL RECORD NO.:  [id number]      DATE OF BIRTH:  [birth year]  PHYSICIAN:  Wayland Denis, DO       VISIT DATE:  04/24/2011                                  OFFICE VISIT   Mr. Gavin [name] is here with his wife.  He is an 77 year old white male being followed for right lateral fifth toe ulcer.  He is doing extremely well with the ulcer which is nearly all healed.  He has been using a triple antibiotic ointment on it.  He has not had any significant change from last week but it has been pinpoint in size, but his pain is out of proportion to his actual wound which is concerning, so we sent him to Dr. Victorino Dike who sent him to an orthotist.  He also reviewed his scans and does not feel that this is a bony problem.  He does have some arthritis. The orthotist is working with him for trying to relieve the pressure on that area.  There has been no change in his medications or social history.  PHYSICAL EXAMINATION:  GENERAL:  He is alert and oriented, cooperative, in no acute distress, pleasant. HEENT:  Pupils equal.  Extraocular muscles intact. NECK:  No cervical lymphadenopathy. LUNGS:  Clear. HEART:  Regular. EXTREMITIES:  Lower Extremity:  The wound is noted in the nurses' chart.  Plan for continuing triple antibiotic ointment, elevation, and working with the orthotist on pressure alleviation.  We will see him back in a couple of weeks.     Wayland Denis, DO     CS/MEDQ  D:  04/24/2011  T:  04/24/2011  Job:  [id number]

## 2011-05-16 NOTE — Progress Notes (Signed)
[facility]  NAME:  DEMAREE, LIBERTO                     ACCOUNT NO.:  MEDICAL RECORD NO.:  [id number]      DATE OF BIRTH:  1929-03-18  PHYSICIAN:  Wayland Denis, DO       VISIT DATE:  05/15/2011                                  OFFICE VISIT   Mr. Hymie is an 77 year old white male who is here with his wife for a new visit.  He has a new ulcer on the right lower extremity.  He has been wearing his compression hose and states he noticed some drainage coming through the hose.  He was not sure where it was until he took them off and realized he had another wound.  He states he does not remember bumping it or hitting it and there is no history of a bite or anything like that.  There has been no change in his medications, although he does have a followup with his doctor about his Coumadin level and has some bruising on his arms but otherwise, he is not sure how this happened.  He has been taking his multivitamin.  There have been no other changes.  Review of systems is negative.  Social history is unchanged.  PHYSICAL EXAMINATION:  GENERAL:  He is alert, oriented, cooperative, in no acute distress.  He is cooperative with the exam. HEENT:  His pupils are equal.  Extraocular muscles are intact.  No cervical lymphadenopathy. LUNGS:  Clear. HEART:  Regular rate. ABDOMEN:  Soft. NECK:  Nontender and no lymphadenopathy. SKIN:  Wound is small, superficial, and does not appear to be infected.  Cultures were actually done and negative, and we placed Oasis today. __________ also put __________ .     Wayland Denis, DO     CS/MEDQ  D:  05/15/2011  T:  05/15/2011  Job:  [phone]

## 2011-05-22 NOTE — Progress Notes (Signed)
[facility]  NAME:  Gavin [name] NO.:  [id number]  MEDICAL RECORD NO.:  [id number]      DATE OF BIRTH:  [birth year]  PHYSICIAN:  Wayland Denis, DO       VISIT DATE:  05/22/2011                                  OFFICE VISIT   Mr. Gavin [name] is an 77 year old white male here with his wife for followup on a right lower extremity ulcer.  He has been using Oasis with the Profore with very good improvement.  There has been no change in his medications, social history, or review of systems.  He is very pleased with the progress, and he is actually asking to go back to work.  He works 2 days a week for about 4 hours.  GENERAL:  On exam, he is alert, oriented, and cooperative, in no acute distress. HEENT:  His pupils are equal.  Extraocular muscles are intact. NECK:  No cervical lymphadenopathy. LUNGS:  Clear. HEART:  Regular. ABDOMEN:  Soft.  Wound is as described and size noted in the nurse's note.  Oasis was applied and pulled it over for layering.  We will have him continue with the Oasis, Profore Lite, and have him follow up in 1 week.     Wayland Denis, DO     CS/MEDQ  D:  05/22/2011  T:  05/22/2011  Job:  [id number]

## 2011-05-29 ENCOUNTER — Encounter (HOSPITAL_BASED_OUTPATIENT_CLINIC_OR_DEPARTMENT_OTHER): Payer: Medicare Other | Attending: Plastic Surgery

## 2011-05-29 DIAGNOSIS — L97809 Non-pressure chronic ulcer of other part of unspecified lower leg with unspecified severity: Secondary | ICD-10-CM | POA: Insufficient documentation

## 2011-05-29 NOTE — Progress Notes (Signed)
[facility]  NAME:  AVETT, REINECK NO.:  [id number]  MEDICAL RECORD NO.:  [id number]      DATE OF BIRTH:  May 02, 1929  PHYSICIAN:  Wayland Denis, DO       VISIT DATE:  05/29/2011                                  OFFICE VISIT   Mr. Beauregard is an 77 year old white male who is here with his wife for followup on his right lower extremity ulcer.  We have been using Oasis on it, which looks completely healed and epithelialized.  He is very pleased.  There is no pain.  There is no drainage.  There is no sign of infection.  We would like to have him continue with DuoDerm for a few weeks and the compression hose so that this does not breakdown.  We encouraged him to keep it elevated, to continue with multivitamin, and healthy eating.  PHYSICAL EXAMINATION:  GENERAL:  On exam, he is alert, oriented, cooperative in no acute distress, pleasant. EYES:  Pupils are equal.  Extraocular muscles were intact. LUNGS:  Clear. HEART:  Regular. ABDOMEN:  Soft.  We will have him follow up as needed.     Wayland Denis, DO     CS/MEDQ  D:  05/29/2011  T:  05/29/2011  Job:  [id number]

## 2011-06-16 ENCOUNTER — Other Ambulatory Visit: Payer: Self-pay | Admitting: Internal Medicine

## 2011-06-17 NOTE — Telephone Encounter (Signed)
Please advise if okay to refill. Thanks.  

## 2011-06-17 NOTE — Telephone Encounter (Signed)
Ok to refill 

## 2011-06-17 NOTE — Telephone Encounter (Signed)
Refill of sonata has been sent to the pharamcy per CY

## 2011-06-27 LAB — CARDIAC PANEL(CRET KIN+CKTOT+MB+TROPI)
CK, MB: 12.3 ng/mL — ABNORMAL HIGH (ref 0.3–4.0)
CK, MB: 5.3 ng/mL — ABNORMAL HIGH (ref 0.3–4.0)
Relative Index: 4.7 — ABNORMAL HIGH (ref 0.0–2.5)
Relative Index: 5.3 — ABNORMAL HIGH (ref 0.0–2.5)
Total CK: 111 U/L (ref 7–232)
Total CK: 178 U/L (ref 7–232)
Total CK: 263 U/L — ABNORMAL HIGH (ref 7–232)
Total CK: 339 U/L — ABNORMAL HIGH (ref 7–232)
Troponin I: 1.94 ng/mL (ref 0.00–0.06)
Troponin I: 2.65 ng/mL (ref 0.00–0.06)
Troponin I: 8.13 ng/mL (ref 0.00–0.06)

## 2011-06-27 LAB — BASIC METABOLIC PANEL
BUN: 14 mg/dL (ref 6–23)
BUN: 14 mg/dL (ref 6–23)
BUN: 14 mg/dL (ref 6–23)
CO2: 28 mEq/L (ref 19–32)
CO2: 29 mEq/L (ref 19–32)
Calcium: 8.3 mg/dL — ABNORMAL LOW (ref 8.4–10.5)
Calcium: 8.7 mg/dL (ref 8.4–10.5)
Chloride: 104 mEq/L (ref 96–112)
Creatinine, Ser: 1.17 mg/dL (ref 0.4–1.5)
Creatinine, Ser: 1.2 mg/dL (ref 0.4–1.5)
GFR calc Af Amer: 58 mL/min — ABNORMAL LOW (ref >60)
GFR calc non Af Amer: 59 mL/min — ABNORMAL LOW (ref >60)
GFR calc non Af Amer: >60 mL/min (ref >60)
Glucose, Bld: 104 mg/dL — ABNORMAL HIGH (ref 70–99)
Glucose, Bld: 112 mg/dL — ABNORMAL HIGH (ref 70–99)
Potassium: 4.7 mEq/L (ref 3.5–5.1)

## 2011-06-27 LAB — CBC
HCT: 41.2 % (ref 39.0–52.0)
MCHC: 33 g/dL (ref 30.0–36.0)
MCHC: 33.6 g/dL (ref 30.0–36.0)
MCHC: 33.7 g/dL (ref 30.0–36.0)
MCV: 101.4 fL — ABNORMAL HIGH (ref 78.0–100.0)
MCV: 99.7 fL (ref 78.0–100.0)
Platelets: 299 10*3/uL (ref 150–400)
Platelets: 323 10*3/uL (ref 150–400)
Platelets: 337 10*3/uL (ref 150–400)
RBC: 4.07 MIL/uL — ABNORMAL LOW (ref 4.22–5.81)
RDW: 13.5 % (ref 11.5–15.5)
RDW: 13.6 % (ref 11.5–15.5)
RDW: 13.8 % (ref 11.5–15.5)
WBC: 10.7 10*3/uL — ABNORMAL HIGH (ref 4.0–10.5)
WBC: 8.1 10*3/uL (ref 4.0–10.5)

## 2011-06-27 LAB — LIPID PANEL
Cholesterol: 93 mg/dL (ref 0–200)
HDL: 34 mg/dL — ABNORMAL LOW (ref >39)
Total CHOL/HDL Ratio: 2.7 RATIO
Triglycerides: 71 mg/dL (ref <150)

## 2011-06-27 LAB — POCT I-STAT, CHEM 8
Calcium, Ion: 1.17 mmol/L (ref 1.12–1.32)
Chloride: 105 mEq/L (ref 96–112)
Creatinine, Ser: 1.2 mg/dL (ref 0.4–1.5)
Glucose, Bld: 98 mg/dL (ref 70–99)
HCT: 42 % (ref 39.0–52.0)

## 2011-06-27 LAB — DIFFERENTIAL
Eosinophils Relative: 1 % (ref 0–5)
Lymphocytes Relative: 21 % (ref 12–46)
Lymphs Abs: 1.7 10*3/uL (ref 0.7–4.0)
Monocytes Absolute: 0.9 10*3/uL (ref 0.1–1.0)

## 2011-06-27 LAB — PROTIME-INR
INR: 1.1 (ref 0.00–1.49)
INR: 1.8 — ABNORMAL HIGH (ref 0.00–1.49)
INR: 2.8 — ABNORMAL HIGH (ref 0.00–1.49)
Prothrombin Time: 14.8 seconds (ref 11.6–15.2)
Prothrombin Time: 15.6 seconds — ABNORMAL HIGH (ref 11.6–15.2)

## 2011-06-27 LAB — B-NATRIURETIC PEPTIDE (CONVERTED LAB): Pro B Natriuretic peptide (BNP): 393 pg/mL — ABNORMAL HIGH (ref 0.0–100.0)

## 2011-06-27 LAB — URINALYSIS, ROUTINE W REFLEX MICROSCOPIC
Glucose, UA: NEGATIVE mg/dL
Ketones, ur: NEGATIVE mg/dL
Nitrite: NEGATIVE
Specific Gravity, Urine: >1.046 — ABNORMAL HIGH (ref 1.005–1.030)
pH: 7 (ref 5.0–8.0)

## 2011-06-27 LAB — COMPREHENSIVE METABOLIC PANEL
AST: 37 U/L (ref 0–37)
Albumin: 3.3 g/dL — ABNORMAL LOW (ref 3.5–5.2)
Calcium: 8.6 mg/dL (ref 8.4–10.5)
Creatinine, Ser: 1.09 mg/dL (ref 0.4–1.5)
GFR calc Af Amer: >60 mL/min (ref >60)
GFR calc non Af Amer: >60 mL/min (ref >60)

## 2011-06-27 LAB — MAGNESIUM: Magnesium: 2.4 mg/dL (ref 1.5–2.5)

## 2011-06-27 LAB — TSH: TSH: 1.325 u[IU]/mL (ref 0.350–4.500)

## 2011-07-03 ENCOUNTER — Encounter (HOSPITAL_BASED_OUTPATIENT_CLINIC_OR_DEPARTMENT_OTHER): Payer: Medicare Other | Attending: Plastic Surgery

## 2011-07-03 DIAGNOSIS — L97809 Non-pressure chronic ulcer of other part of unspecified lower leg with unspecified severity: Secondary | ICD-10-CM | POA: Insufficient documentation

## 2011-08-01 HISTORY — PX: NM MYOVIEW LTD: HXRAD82

## 2011-08-21 ENCOUNTER — Encounter (HOSPITAL_BASED_OUTPATIENT_CLINIC_OR_DEPARTMENT_OTHER): Payer: Medicare Other | Attending: Plastic Surgery

## 2011-08-21 DIAGNOSIS — S91309A Unspecified open wound, unspecified foot, initial encounter: Secondary | ICD-10-CM | POA: Insufficient documentation

## 2011-08-21 DIAGNOSIS — X58XXXA Exposure to other specified factors, initial encounter: Secondary | ICD-10-CM | POA: Insufficient documentation

## 2011-08-21 NOTE — Progress Notes (Signed)
[facility]  NAME:  THURL, BOEN NO.:  [id number]  MEDICAL RECORD NO.:  [id number]      DATE OF BIRTH:  [birth year]  PHYSICIAN:  Wayland Denis, DO       VISIT DATE:  08/21/2011                                  OFFICE VISIT   Mr. Gatta is an 77 year old white male here for evaluation of a right lateral foot wound.  He is known to the clinic.  I have seen him before for different lower extremity wounds.  He has not been wearing his shoes from the podiatrist and it is likely that he had some pressure on the lateral aspect of his foot that caused this open wound.  He is otherwise doing well and there has not been any change in his social history or medications.  On exam, he is alert, oriented, cooperative, not in any acute distress. He is pleasant for the exam and a good historian.  His pupils are equal. Extraocular muscles are intact.  No cervical lymphadenopathy.  His breathing is unlabored.  His heart is regular.  His abdomen is soft.  His wound is as described.  We recommend Silvercel to help with the drainage and going back to the shoe and splint that will help prevent pressure, also for him to follow up with his podiatrist to have another pain of shoes made to decrease the pressure on the lateral aspect of his foot right where the wound is located.  I will see him back in 1 week.     Wayland Denis, DO     CS/MEDQ  D:  08/21/2011  T:  08/21/2011  Job:  [id number]

## 2011-08-28 ENCOUNTER — Encounter (HOSPITAL_BASED_OUTPATIENT_CLINIC_OR_DEPARTMENT_OTHER): Payer: Medicare Other | Attending: Plastic Surgery

## 2011-08-28 DIAGNOSIS — R609 Edema, unspecified: Secondary | ICD-10-CM | POA: Insufficient documentation

## 2011-08-28 DIAGNOSIS — I8390 Asymptomatic varicose veins of unspecified lower extremity: Secondary | ICD-10-CM | POA: Insufficient documentation

## 2011-08-28 DIAGNOSIS — L97509 Non-pressure chronic ulcer of other part of unspecified foot with unspecified severity: Secondary | ICD-10-CM | POA: Insufficient documentation

## 2011-08-29 NOTE — Progress Notes (Signed)
[facility]  NAME:  Gavin [name], Gavin [name] NO.:  [id number]  MEDICAL RECORD NO.:  [id number]      DATE OF BIRTH:  [birth year]  PHYSICIAN:  Wayland Denis, DO       VISIT DATE:  08/28/2011                                  OFFICE VISIT   Gavin [name] is an 77 year old gentleman who is here in followup for his right lower extremity ulcer.  His wife is not with him today, unfortunately, she has been sick at home.  He is not sure of what actually she had, but she was hospitalized for 9 days.  He has been using Silvercel with not much change in the wound.  It does seem to be granulating.  It does not look infected.  It is still very tender.  He has some little bit of skin breakdown with some maceration on the distal aspect of the foot just adjacent to the wound.  It is possible that with moisture in his sock or even irritation from the dressing.  He did try and go to see Podiatry, but we have told that no prosthetics could be done until he had resolution of the wound, and I understand that.  I was just concerned that the current orthotics were increasing the irritation to the area, but he is now in a blue shoe with an opening on that aspect laterally.  There has been no change in his medications or social history.  PHYSICAL EXAMINATION:  He is alert, oriented, cooperative, not in any acute distress.  He is pleasant.  Pupils are equal.  Extraocular muscles are intact.  No cervical lymphadenopathy.  His breathing is unlabored. His heart is regular.  The wound is as described.  We will continue with the Silvercel for now and do a doughnut around it with an opening approximately to prevent restriction in his blood flow, and we will see him back in a week to see how that has progressed.     Wayland Denis, DO     CS/MEDQ  D:  08/28/2011  T:  08/29/2011  Job:  [id number]

## 2011-09-04 NOTE — Progress Notes (Signed)
[facility]  NAME:  ALBI, RAPPAPORT                ACCOUNT NO.:  [id number]  MEDICAL RECORD NO.:  [id number]      DATE OF BIRTH:  11/23/1928  PHYSICIAN:  Wayland Denis, DO       VISIT DATE:  09/04/2011                                  OFFICE VISIT   HISTORY OF PRESENT ILLNESS:  Mr. Gartrell is an 77 year old gentleman who is here for followup on his right lower extremity foot ulcer.  He has been using Silvercel with some improvement over the last week.  He had some pressure and he said it always made it uncomfortable when he put his shoe on.  We talked to him again about not wearing shoe and wearing the shoe splint that we gave him, which he has donned for the last couple of days and did show some improvement.  There has been no change in medications.  SOCIAL HISTORY:  Unchanged.  His wife is still ill and at home.  PHYSICAL EXAMINATION:  GENERAL:  He is alert and oriented, cooperative, not in any acute distress.  He is pleasant. EYES:  Pupils are equal.  Extraocular muscles are intact. NECK:  No cervical lymphadenopathy. RESPIRATIONS:  Breathing is unlabored. HEART:  Regular.  The wound is as described.  We will continue with the Silvercel and the Darco shoe, and have him follow up in a week.  At some point, he is going to need to see a podiatrist about getting better shoes, but when he went the last time the podiatrist wanted to wait until his wound was healed, which is understandable, so we will follow up in 1 week.     Wayland Denis, DO     CS/MEDQ  D:  09/04/2011  T:  09/04/2011  Job:  [id number]

## 2011-09-12 NOTE — Progress Notes (Signed)
[facility]  NAME:  Gavin, [name]                     ACCOUNT NO.:  MEDICAL RECORD NO.:  [id number]      DATE OF BIRTH:  [birth year]  PHYSICIAN:  Wayland Denis, DO       VISIT DATE:  09/11/2011                                  OFFICE VISIT   HISTORY:  Mr. Gavin [name] is an 77 year old gentleman he is here for followup on his right foot ulcer.  He has been using Silvercel over the past week, was trying to keep off it which shows good improvement granulating and epithelializing of the wound; however, pain is not improving.  He has no other change in his exam.  REVIEW OF SYSTEMS:  Negative if not otherwise stated.  PHYSICAL EXAM:  GENERAL:  He is alert, oriented, cooperative, not in any acute distress. HEENT:  His pupils are equal.  Glasses are drawn.  No cervical lymphadenopathy. LUNGS:  His breathing is unlabored. CARDIOVASCULAR:  His heart is regular. ABDOMEN:  Soft.  He has lost a little bit of weight. EXTREMITIES:  Lower extremity, minimal edema, lots of varicosities.  The wound improved as mentioned and noted in the nurse's note.  PLAN:  We are going to use a collagen with a dressing.  He is caring for his wife right now, so he cannot do a cast because he has got to be able to drive, so we are a little bit limited from that standpoint, and we would like Dr. Victorino Dike to take another look to see if there is a reason for the pain, which I at this point, I am not able to explain or get rid of, and we will see him back in a week.     Wayland Denis, DO     CS/MEDQ  D:  09/11/2011  T:  09/11/2011  Job:  [id number]

## 2011-09-13 ENCOUNTER — Ambulatory Visit (INDEPENDENT_AMBULATORY_CARE_PROVIDER_SITE_OTHER): Payer: Medicare Other | Admitting: Internal Medicine

## 2011-09-13 ENCOUNTER — Encounter: Payer: Self-pay | Admitting: *Deleted

## 2011-09-13 VITALS — BP 122/78 | HR 56 | Ht 67.0 in | Wt 207.8 lb

## 2011-09-13 DIAGNOSIS — J984 Other disorders of lung: Secondary | ICD-10-CM

## 2011-09-13 DIAGNOSIS — J449 Chronic obstructive pulmonary disease, unspecified: Secondary | ICD-10-CM

## 2011-09-13 DIAGNOSIS — G4733 Obstructive sleep apnea (adult) (pediatric): Secondary | ICD-10-CM

## 2011-09-13 NOTE — Patient Instructions (Signed)
Please call as needed 

## 2011-09-13 NOTE — Progress Notes (Signed)
09/13/11 -77 year old male former smoker followed for obstructive sleep apnea, COPD, complicated by GERD, CAD, history of lung nodule LOV- 09/13/2010 PCP Dr Lucila Maine. Has had flu vaccine and shingles vaccine this year. Getting hyperbaric treatment for circulatory ischemia/wound right foot. He continues CPAP 9/[facility] using a nasal pillows mask. He is able to use this all night every night. We discussed his history of COPD-he quit smoking many years ago. He denies any wheeze, cough, or shortness of breath with his routine activities, recognizing that exertion is limited. I asked about his lung nodule. He had a CT scan at [place]. We will request that report.  ROS-see HPI Constitutional:   No-   weight loss, night sweats, fevers, chills, fatigue, lassitude. HEENT:   No-  headaches, difficulty swallowing, tooth/dental problems, sore throat,       No-  sneezing, itching, ear ache, nasal congestion, post nasal drip,  CV:  No- acute  chest pain, orthopnea, PND, swelling in lower extremities, anasarca, dizziness, palpitations Resp: No- acute  shortness of breath with exertion or at rest.              No-   productive cough,  No non-productive cough,  No- coughing up of blood.              No-   change in color of mucus.  No- wheezing.   Skin: No-   rash or lesions. GI:  No-   heartburn, indigestion, abdominal pain, nausea, vomiting, diarrhea,                 change in bowel habits, loss of appetite GU:  MS:  Neuro-     nothing unusual Psych:  No- change in mood or affect. No depression or anxiety.  No memory loss.  OBJ General- Alert, Oriented, Affect-appropriate, Distress- none acute Skin- rash-none, lesions- none, excoriation- none Lymphadenopathy- none Head- atraumatic            Eyes- Gross vision intact, PERRLA, conjunctivae clear secretions            Ears- Hearing, canals-normal            Nose- Clear, no-Septal dev, mucus, polyps, erosion, perforation   Throat- Mallampati II , mucosa clear , drainage- none, tonsils- atrophic Neck- flexible , trachea midline, no stridor , thyroid nl, carotid no bruit Chest - symmetrical excursion , unlabored           Heart/CV- RRR , no murmur , no gallop  , no rub, nl s1 s2                           - JVD- none , edema- none, stasis changes- none, varices- none           Lung- very diminished, unlabored, few crackles, wheeze- none, cough- none , dullness-none, rub- none           Chest wall-  Abd- tender-no, distended-no, bowel sounds-present, HSM- no Br/ Gen/ Rectal- Not done, not indicated Extrem- cyanosis- none, clubbing, none, atrophy- none, strength- nl, right foot is in a boot Neuro- grossly intact to observation

## 2011-09-18 ENCOUNTER — Encounter (HOSPITAL_BASED_OUTPATIENT_CLINIC_OR_DEPARTMENT_OTHER): Payer: Medicare Other

## 2011-09-18 ENCOUNTER — Encounter: Payer: Self-pay | Admitting: Internal Medicine

## 2011-09-18 NOTE — Assessment & Plan Note (Signed)
CPAP 9/[facility]-good compliance and control. We discussed use of CPAP. No change required.

## 2011-09-18 NOTE — Assessment & Plan Note (Signed)
He has had followup CT by his physicians in [place]. We will seek that report for our records.

## 2011-09-18 NOTE — Assessment & Plan Note (Signed)
He denies symptoms including cough, chest pain, phlegm. He is very sedentary and not aware of significant exertional dyspnea.

## 2011-09-19 NOTE — Progress Notes (Signed)
[facility]  NAME:  Gavin, [name]                     ACCOUNT NO.:  MEDICAL RECORD NO.:  [id number]      DATE OF BIRTH:  1929-09-06  PHYSICIAN:  Wayland Denis, DO       VISIT DATE:  09/18/2011                                  OFFICE VISIT   HISTORY:  Gavin [name] is an 77 year old gentleman who is here for followup on his right lower extremity ulcer.  He has been using collagen and offloading it with a boot.  He complains of a pain that is still shooting up his leg.  There is no significant change in the way the wound looks or even in the surrounding tissue.  I do not have a good explanation for why this hurts as much as it does.  There has been no change in his medications or social history.  His wife is doing a little bit better at home.  PHYSICAL EXAMINATION:  He is alert, oriented, cooperative, not in any acute distress.  He is pleasant, but a little bit sleepy today, but otherwise doing well.  PLAN:  The wound is as described and noted in the nurse's note.  We will continue with collagen and add a little Santyl, send a culture and start him on doxycycline.  I also recommended that he continue with his primary care doctor for pain medicine and if that does not work, we can send him for the [facility].  I will see him back in a week.     Wayland Denis, DO     CS/MEDQ  D:  09/18/2011  T:  09/18/2011  Job:  [id number]

## 2011-09-25 ENCOUNTER — Encounter (HOSPITAL_BASED_OUTPATIENT_CLINIC_OR_DEPARTMENT_OTHER): Payer: Medicare Other | Attending: Plastic Surgery

## 2011-09-25 DIAGNOSIS — I8390 Asymptomatic varicose veins of unspecified lower extremity: Secondary | ICD-10-CM | POA: Diagnosis not present

## 2011-09-25 DIAGNOSIS — L97509 Non-pressure chronic ulcer of other part of unspecified foot with unspecified severity: Secondary | ICD-10-CM | POA: Insufficient documentation

## 2011-09-25 DIAGNOSIS — R609 Edema, unspecified: Secondary | ICD-10-CM | POA: Diagnosis not present

## 2011-09-25 NOTE — Progress Notes (Signed)
[facility]  NAME:  Gavin [name], Gavin [name] NO.:  [id number]  MEDICAL RECORD NO.:  [id number]      DATE OF BIRTH:  [birth year]  PHYSICIAN:  Wayland Denis, DO       VISIT DATE:  09/25/2011                                  OFFICE VISIT   Mr. Witz is an 78 year old white male who is here for followup on his right lower extremity ulcer.  He has been using collagen and Santyl with some improvement.  He still has a lot of pain distal to the actual wound area and then today had some periwound skin irritation.  There has been no change in his medications.  SOCIAL HISTORY:  Unchanged.  PHYSICAL EXAMINATION:  GENERAL:  He is alert, oriented, cooperative, not in any acute distress. HEENT:  His pupils are equal.  Extraocular muscles are intact.  His glasses are in place. NECK:  No cervical lymphadenopathy. LUNGS:  His breathing is unlabored. HEART:  Regular. ABDOMEN:  Soft. SKIN:  His wound is as described.  No sign of drainage or infection.  We will continue with the collagen and wrap and have him evaluated by [employer] to see if fitting shoes may help prevent the irritation.  He is encouraged to continue elevation and multivitamin, and I spoke with Dr. Victorino Dike for a second opinion to see if there is anything else that can be done to help with the pain and he is going to have his nurse call Mr. Mannis for a followup appointment and we will see him back in 2 weeks.     Wayland Denis, DO     CS/MEDQ  D:  09/25/2011  T:  09/25/2011  Job:  [id number]

## 2011-09-30 DIAGNOSIS — I4891 Unspecified atrial fibrillation: Secondary | ICD-10-CM | POA: Diagnosis not present

## 2011-09-30 DIAGNOSIS — Z7901 Long term (current) use of anticoagulants: Secondary | ICD-10-CM | POA: Diagnosis not present

## 2011-10-03 DIAGNOSIS — I951 Orthostatic hypotension: Secondary | ICD-10-CM | POA: Diagnosis not present

## 2011-10-09 DIAGNOSIS — N3941 Urge incontinence: Secondary | ICD-10-CM | POA: Diagnosis not present

## 2011-10-09 DIAGNOSIS — L97509 Non-pressure chronic ulcer of other part of unspecified foot with unspecified severity: Secondary | ICD-10-CM | POA: Diagnosis not present

## 2011-10-09 DIAGNOSIS — I8390 Asymptomatic varicose veins of unspecified lower extremity: Secondary | ICD-10-CM | POA: Diagnosis not present

## 2011-10-09 DIAGNOSIS — R609 Edema, unspecified: Secondary | ICD-10-CM | POA: Diagnosis not present

## 2011-10-09 DIAGNOSIS — N3944 Nocturnal enuresis: Secondary | ICD-10-CM | POA: Diagnosis not present

## 2011-10-10 DIAGNOSIS — Z5189 Encounter for other specified aftercare: Secondary | ICD-10-CM | POA: Diagnosis not present

## 2011-10-10 DIAGNOSIS — Z9861 Coronary angioplasty status: Secondary | ICD-10-CM | POA: Diagnosis not present

## 2011-10-10 DIAGNOSIS — I252 Old myocardial infarction: Secondary | ICD-10-CM | POA: Diagnosis not present

## 2011-10-10 NOTE — Progress Notes (Signed)
[facility]  NAME:  Gavin [name], Gavin [name]                     ACCOUNT NO.:  MEDICAL RECORD NO.:  [id number]      DATE OF BIRTH:  March 04, 1929  PHYSICIAN:  Wayland Denis, DO            VISIT DATE:                                  OFFICE VISIT   Gavin [name] is an 78 year old gentleman who is here for followup on his right lower extremity foot ulcer.  He has been using collagen with some improvement, but he still complains of pain that has not improved and he has significant swelling in his leg today.  He states that he had some changes to his doses of his heart medications that likely contributed to the swelling, and he has a call into his doc to have that re-evaluated with his cardiologist.  There has been no other change in his medications.  SOCIAL HISTORY:  Unchanged.  His wife is doing a little bit better at home.  REVIEW OF SYSTEMS:  Negative.  PHYSICAL EXAMINATION:  GENERAL:  He is alert, oriented, cooperative, not in any acute distress.  He is pleasant. HEENT:  Pupils are equal.  Extraocular muscles are intact. NECK:  No cervical lymphadenopathy. CHEST:  His breathing is unlabored. HEART:  Regular. ABDOMEN:  Soft. EXTREMITIES:  His pulses are regular and present, but weaker on the right due to the swelling.  We are going to go ahead with an Unna boot and collagen for the wound, and he will follow up with Dr. Victorino Dike as well as work on getting a proper shoe fitting so to keep the pressure off of the ulcerated area.     Wayland Denis, DO     CS/MEDQ  D:  10/09/2011  T:  10/10/2011  Job:  [id number]

## 2011-10-16 DIAGNOSIS — L97509 Non-pressure chronic ulcer of other part of unspecified foot with unspecified severity: Secondary | ICD-10-CM | POA: Diagnosis not present

## 2011-10-16 DIAGNOSIS — R609 Edema, unspecified: Secondary | ICD-10-CM | POA: Diagnosis not present

## 2011-10-16 DIAGNOSIS — I8390 Asymptomatic varicose veins of unspecified lower extremity: Secondary | ICD-10-CM | POA: Diagnosis not present

## 2011-10-17 NOTE — Progress Notes (Signed)
[facility]  NAME:  STEEN, BISIG NO.:  [id number]  MEDICAL RECORD NO.:  [id number]      DATE OF BIRTH:  May 20, 1929  PHYSICIAN:  Wayland Denis, DO       VISIT DATE:  10/16/2011                                  OFFICE VISIT   Gavin [name] is an 78 year old gentleman who is here for followup on his right foot ulcer.  He has been using Radio broadcast assistant with [employer] and [employer], and actually doing well.  He is healing in.  He has not had his appointment followup with Dr. Victorino Dike yet.  There has been no change in his medications or social history.  On exam, he is alert, oriented, cooperative, not in any acute distress. He is pleasant.  Pupils are equal.  Extraocular muscles are intact.  No cervical lymphadenopathy.  His breathing is unlabored.  His heart is regular.  His abdomen is soft.  The wound is granulating in and epithelializing.  So it is doing a little bit better.  Still appears that this was due to pressure.  He complains mostly of the pain.  I have recommended a followup with Dr. Victorino Dike and pain service to see if we can get some relief from a pain standpoint, and we will see him back in a week.  We will continue with the wraps.     Wayland Denis, DO     CS/MEDQ  D:  10/16/2011  T:  10/17/2011  Job:  [id number]

## 2011-10-23 DIAGNOSIS — R609 Edema, unspecified: Secondary | ICD-10-CM | POA: Diagnosis not present

## 2011-10-23 DIAGNOSIS — L97509 Non-pressure chronic ulcer of other part of unspecified foot with unspecified severity: Secondary | ICD-10-CM | POA: Diagnosis not present

## 2011-10-23 DIAGNOSIS — I8390 Asymptomatic varicose veins of unspecified lower extremity: Secondary | ICD-10-CM | POA: Diagnosis not present

## 2011-10-23 NOTE — Progress Notes (Signed)
[facility]  NAME:  Gavin [name], Gavin [name]                     ACCOUNT NO.:  MEDICAL RECORD NO.:  [id number]      DATE OF BIRTH:  Aug 01, 1929  PHYSICIAN:  Wayland Denis, DO       VISIT DATE:  10/23/2011                                  OFFICE VISIT   HISTORY:  Gavin [name] is an 78 year old gentleman who is here for followup on his right foot ulcer.  He does have an appointment now for followup with Dr. Victorino Dike and with the pain service.  There is a little bit improvement in the way that the wound looks.  He has been using the collagen and Unna boot, which seems to be helping. There has been no change in his medications or social history.  EXAM:  GENERAL:  He is alert, oriented, cooperative, not in any acute distress.  He is pleasant. HEENT:  Pupils are equal.  Extraocular muscles are intact. NECK:  No cervical lymphadenopathy. LUNGS:  His breathing is unlabored. HEART:  Regular. ABDOMEN:  Soft. SKIN:  The wound is noted in the nurse's note.  PLAN:  We will continue with the Unna boot and collagen, and we will see him back in followup.     Wayland Denis, DO     CS/MEDQ  D:  10/23/2011  T:  10/23/2011  Job:  [id number]

## 2011-10-30 ENCOUNTER — Encounter (HOSPITAL_BASED_OUTPATIENT_CLINIC_OR_DEPARTMENT_OTHER): Payer: Medicare Other | Attending: Plastic Surgery

## 2011-10-30 DIAGNOSIS — L97509 Non-pressure chronic ulcer of other part of unspecified foot with unspecified severity: Secondary | ICD-10-CM | POA: Diagnosis not present

## 2011-10-30 DIAGNOSIS — R609 Edema, unspecified: Secondary | ICD-10-CM | POA: Insufficient documentation

## 2011-10-30 DIAGNOSIS — I8390 Asymptomatic varicose veins of unspecified lower extremity: Secondary | ICD-10-CM | POA: Insufficient documentation

## 2011-10-30 DIAGNOSIS — Z9861 Coronary angioplasty status: Secondary | ICD-10-CM | POA: Diagnosis not present

## 2011-10-30 DIAGNOSIS — I252 Old myocardial infarction: Secondary | ICD-10-CM | POA: Diagnosis not present

## 2011-10-30 DIAGNOSIS — Z5189 Encounter for other specified aftercare: Secondary | ICD-10-CM | POA: Diagnosis not present

## 2011-10-31 NOTE — Progress Notes (Signed)
[facility]  NAME:  MAXEY, RANSOM NO.:  [id number]  MEDICAL RECORD NO.:  [id number]      DATE OF BIRTH:  1929/08/26  PHYSICIAN:  Wayland Denis, DO       VISIT DATE:  10/30/2011                                  OFFICE VISIT   Mr. Joynt is an 78 year old gentleman who is here for followup on his right lower extremity lateral foot wound.  He used the [employer] last week.  The wound actually had some improvement with decrease in size and swelling; however, he complains of immense pain.  This is still not explained by the wound that is noted.  There has been no change in his medications.  REVIEW OF SYSTEMS:  Otherwise negative.  PHYSICAL EXAMINATION:  GENERAL:  He is alert, oriented, cooperative, not in any acute distress. HEENT:  Pupils are equal.  Extraocular muscles are intact. NECK:  No cervical lymphadenopathy. CHEST:  Breathing is unlabored. HEART:  Regular. ABDOMEN:  Soft. SKIN:  The wound is as noted in the nurse's note and described with some mild improvement.  It is still extremely tender to touch.  We are going to continue with the Unna boot for this week and have him see Dr. Victorino Dike next week.  We will see him after that and see if there is anything that can be done from a bone standpoint.     Wayland Denis, DO     CS/MEDQ  D:  10/30/2011  T:  10/31/2011  Job:  [id number]

## 2011-11-06 DIAGNOSIS — L97509 Non-pressure chronic ulcer of other part of unspecified foot with unspecified severity: Secondary | ICD-10-CM | POA: Diagnosis not present

## 2011-11-06 DIAGNOSIS — L97909 Non-pressure chronic ulcer of unspecified part of unspecified lower leg with unspecified severity: Secondary | ICD-10-CM | POA: Diagnosis not present

## 2011-11-06 DIAGNOSIS — I8390 Asymptomatic varicose veins of unspecified lower extremity: Secondary | ICD-10-CM | POA: Diagnosis not present

## 2011-11-06 DIAGNOSIS — R609 Edema, unspecified: Secondary | ICD-10-CM | POA: Diagnosis not present

## 2011-11-11 DIAGNOSIS — Z7901 Long term (current) use of anticoagulants: Secondary | ICD-10-CM | POA: Diagnosis not present

## 2011-11-11 DIAGNOSIS — I4891 Unspecified atrial fibrillation: Secondary | ICD-10-CM | POA: Diagnosis not present

## 2011-11-12 DIAGNOSIS — L97909 Non-pressure chronic ulcer of unspecified part of unspecified lower leg with unspecified severity: Secondary | ICD-10-CM | POA: Diagnosis not present

## 2011-11-13 DIAGNOSIS — R350 Frequency of micturition: Secondary | ICD-10-CM | POA: Diagnosis not present

## 2011-11-13 DIAGNOSIS — L97509 Non-pressure chronic ulcer of other part of unspecified foot with unspecified severity: Secondary | ICD-10-CM | POA: Diagnosis not present

## 2011-11-13 DIAGNOSIS — I8390 Asymptomatic varicose veins of unspecified lower extremity: Secondary | ICD-10-CM | POA: Diagnosis not present

## 2011-11-13 DIAGNOSIS — R609 Edema, unspecified: Secondary | ICD-10-CM | POA: Diagnosis not present

## 2011-11-13 DIAGNOSIS — N3941 Urge incontinence: Secondary | ICD-10-CM | POA: Diagnosis not present

## 2011-11-14 NOTE — Progress Notes (Signed)
[facility]  NAME:  DONOLD, MAROTTO                     ACCOUNT NO.:  MEDICAL RECORD NO.:  [id number]      DATE OF BIRTH:  1929/02/17  PHYSICIAN:  Wayland Denis, DO       VISIT DATE:  11/13/2011                                  OFFICE VISIT   Mr. Huhn is an 78 year old gentleman who is here for followup on his right lower extremity ulcer.  It has been treated over the past week with an Radio broadcast assistant and Silvercel, and it looks much better.  He has less redness and more healing and granulation.  He has an appointment with Dr. Victorino Dike.  An MRI was done yesterday, so we are waiting for those results and we will try and get them.  There has been no change in his medications or social history.  On exam, he is alert, oriented, cooperative, not in any acute distress. He is pleased with the progress, but he still complains of the pain. His pupils are equal.  Extraocular muscles are intact.  No cervical lymphadenopathy.  His breathing is unlabored.  His heart is regular. His abdomen is soft.  The wound is noted in the nurse's notes and described.  We will continue with Aquacel Ag, and wait for the MRI results and have him follow up in 1 week.     Wayland Denis, DO     CS/MEDQ  D:  11/13/2011  T:  11/13/2011  Job:  [id number]

## 2011-11-18 DIAGNOSIS — L97909 Non-pressure chronic ulcer of unspecified part of unspecified lower leg with unspecified severity: Secondary | ICD-10-CM | POA: Diagnosis not present

## 2011-11-20 DIAGNOSIS — R609 Edema, unspecified: Secondary | ICD-10-CM | POA: Diagnosis not present

## 2011-11-20 DIAGNOSIS — I8390 Asymptomatic varicose veins of unspecified lower extremity: Secondary | ICD-10-CM | POA: Diagnosis not present

## 2011-11-20 DIAGNOSIS — L97509 Non-pressure chronic ulcer of other part of unspecified foot with unspecified severity: Secondary | ICD-10-CM | POA: Diagnosis not present

## 2011-11-22 DIAGNOSIS — J069 Acute upper respiratory infection, unspecified: Secondary | ICD-10-CM | POA: Diagnosis not present

## 2011-11-26 DIAGNOSIS — Z5189 Encounter for other specified aftercare: Secondary | ICD-10-CM | POA: Diagnosis not present

## 2011-11-26 DIAGNOSIS — I6529 Occlusion and stenosis of unspecified carotid artery: Secondary | ICD-10-CM | POA: Diagnosis not present

## 2011-11-26 DIAGNOSIS — I70209 Unspecified atherosclerosis of native arteries of extremities, unspecified extremity: Secondary | ICD-10-CM | POA: Diagnosis not present

## 2011-11-26 DIAGNOSIS — B351 Tinea unguium: Secondary | ICD-10-CM | POA: Diagnosis not present

## 2011-11-26 DIAGNOSIS — I252 Old myocardial infarction: Secondary | ICD-10-CM | POA: Diagnosis not present

## 2011-11-27 ENCOUNTER — Encounter: Payer: Self-pay | Admitting: Physical Medicine and Rehabilitation

## 2011-11-27 ENCOUNTER — Encounter
Payer: Medicare Other | Attending: Physical Medicine and Rehabilitation | Admitting: Physical Medicine and Rehabilitation

## 2011-11-27 VITALS — BP 125/41 | HR 67 | Resp 18 | Ht 67.0 in | Wt 213.4 lb

## 2011-11-27 DIAGNOSIS — M79671 Pain in right foot: Secondary | ICD-10-CM

## 2011-11-27 DIAGNOSIS — M79609 Pain in unspecified limb: Secondary | ICD-10-CM

## 2011-11-27 DIAGNOSIS — I998 Other disorder of circulatory system: Secondary | ICD-10-CM | POA: Insufficient documentation

## 2011-11-27 NOTE — Progress Notes (Signed)
Subjective:    Patient ID: Gavin [name], male    DOB: March 10, 1929, 78 y.o.   MRN: [id number]  HPI  The patient is an 78 year old man who has at least a six-month history of right foot pain located at the fifth metatarsal region. He is been followed at the wound center. He has a history of ischemia and has undergone hyperbaric treatments. He's been referred for pain management of this right foot pain.  Patient states pain is worse when there is pressure on the area. He had custom-made shoes which have recently been slit open over the fifth metatarsal area. This seems to have helped somewhat. And he sits in my office today he reports no pain complaints with the shoe off. He tells me he is not interested in oral pain medications.  He has tried lidocaine gel and Lidoderm patches. These have not been particularly helpful, he is also try hydrocodone which is not benefited him as well.  Pain Inventory Average Pain 7 Pain Right Now 9 My pain is constant, sharp, burning and stabbing  In the last 24 hours, has pain interfered with the following? General activity 8 Relation with others 5 Enjoyment of life 8 What TIME of day is your pain at its worst? daytime Sleep (in general) Good  Pain is worse with: walking, standing and some activites Pain improves with: rest Relief from Meds: 0  Mobility use a cane how many minutes can you walk? 15 ability to climb steps?  yes do you drive?  yes transfers alone Do you have any goals in this area?  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  yes  Neuro/Psych bladder control problems bowel control problems trouble walking dizziness anxiety  Prior Studies Any changes since last visit?  yes x-rays CT/MRI  Of foot 2/19.2013  Physicians involved in your care Primary care Lucila Maine Rosalita Levan) Orthopedist Novella Olive Sanger @ wound center/ Dr  Andrey Farmer podiatrist      Review of Systems  Constitutional: Negative.   HENT: Negative.   Eyes: Negative.   Respiratory: Positive for apnea.        Sleep apnea-wears c-pap at hs  Cardiovascular: Positive for leg swelling.       Right leg  Gastrointestinal: Negative.   Genitourinary: Positive for difficulty urinating.       Retention  Musculoskeletal: Positive for gait problem.  Skin: Negative.   Neurological: Positive for dizziness.  Hematological: Bruises/bleeds easily.       On coumadin and plavix  Psychiatric/Behavioral: The patient is nervous/anxious.        Objective:   Physical Exam Examination of the right foot reveals slight swelling over the fifth metatarsal region. Area appears closed and there is no drainage. He reports intact sensation to light touch. Strength is good at the ankle and EHL.  Area is quite tender over the fifth metatarsal region.  Pulses are diminished but the foot is warm.  Transitions from sitting to standing without difficulty. Patient uses a cane for ambulation.       Assessment & Plan:  1. Right foot pain with history of ischemic vascular disease.  Plan: Patient is not interested in oral pain medications at this time. He understands the risks and benefits. Would like to try shoe modification first. Maintain contact with foot center monitor foot for evidence of infection. Patient will be in touch with  aspirin foot and ankle center with [facility] to help with shoe modifications. Patient is comfortable with this plan and we'll see him back in 2 months after modifications are made.

## 2011-11-27 NOTE — Patient Instructions (Signed)
Follow up with [facility] to pursue shoe modifications to take pressure off of 5th metatarsal area. Follow back up with me in 2 months. Will hold off on mediations until shoe modifications are done.

## 2011-12-09 DIAGNOSIS — Z7901 Long term (current) use of anticoagulants: Secondary | ICD-10-CM | POA: Diagnosis not present

## 2011-12-09 DIAGNOSIS — I4891 Unspecified atrial fibrillation: Secondary | ICD-10-CM | POA: Diagnosis not present

## 2011-12-19 DIAGNOSIS — R0602 Shortness of breath: Secondary | ICD-10-CM | POA: Diagnosis not present

## 2011-12-19 DIAGNOSIS — G4733 Obstructive sleep apnea (adult) (pediatric): Secondary | ICD-10-CM | POA: Diagnosis not present

## 2011-12-19 DIAGNOSIS — I2510 Atherosclerotic heart disease of native coronary artery without angina pectoris: Secondary | ICD-10-CM | POA: Diagnosis not present

## 2011-12-24 ENCOUNTER — Encounter: Payer: Self-pay | Admitting: Physical Medicine and Rehabilitation

## 2011-12-24 DIAGNOSIS — I252 Old myocardial infarction: Secondary | ICD-10-CM | POA: Diagnosis not present

## 2011-12-24 DIAGNOSIS — Z5189 Encounter for other specified aftercare: Secondary | ICD-10-CM | POA: Diagnosis not present

## 2011-12-24 DIAGNOSIS — R0602 Shortness of breath: Secondary | ICD-10-CM | POA: Diagnosis not present

## 2011-12-24 HISTORY — PX: US ECHOCARDIOGRAPHY: HXRAD669

## 2011-12-25 ENCOUNTER — Encounter (HOSPITAL_BASED_OUTPATIENT_CLINIC_OR_DEPARTMENT_OTHER): Payer: Medicare Other | Attending: Plastic Surgery

## 2011-12-25 DIAGNOSIS — L97509 Non-pressure chronic ulcer of other part of unspecified foot with unspecified severity: Secondary | ICD-10-CM | POA: Diagnosis not present

## 2011-12-28 ENCOUNTER — Other Ambulatory Visit: Payer: Self-pay | Admitting: Internal Medicine

## 2011-12-30 NOTE — Telephone Encounter (Signed)
Ok to refill 

## 2011-12-30 NOTE — Telephone Encounter (Signed)
Please advise if okay to refill medication. Thanks.  

## 2012-01-06 DIAGNOSIS — Z7901 Long term (current) use of anticoagulants: Secondary | ICD-10-CM | POA: Diagnosis not present

## 2012-01-06 DIAGNOSIS — I4891 Unspecified atrial fibrillation: Secondary | ICD-10-CM | POA: Diagnosis not present

## 2012-01-10 DIAGNOSIS — M47812 Spondylosis without myelopathy or radiculopathy, cervical region: Secondary | ICD-10-CM | POA: Diagnosis not present

## 2012-01-10 DIAGNOSIS — R51 Headache: Secondary | ICD-10-CM | POA: Diagnosis not present

## 2012-01-13 DIAGNOSIS — Z79899 Other long term (current) drug therapy: Secondary | ICD-10-CM | POA: Diagnosis not present

## 2012-01-16 DIAGNOSIS — I10 Essential (primary) hypertension: Secondary | ICD-10-CM | POA: Diagnosis not present

## 2012-01-16 DIAGNOSIS — I2510 Atherosclerotic heart disease of native coronary artery without angina pectoris: Secondary | ICD-10-CM | POA: Diagnosis not present

## 2012-01-16 DIAGNOSIS — I4891 Unspecified atrial fibrillation: Secondary | ICD-10-CM | POA: Diagnosis not present

## 2012-01-22 ENCOUNTER — Encounter: Payer: Self-pay | Admitting: Physical Medicine and Rehabilitation

## 2012-01-22 ENCOUNTER — Encounter (HOSPITAL_BASED_OUTPATIENT_CLINIC_OR_DEPARTMENT_OTHER): Payer: Medicare Other

## 2012-01-22 ENCOUNTER — Encounter
Payer: Medicare Other | Attending: Physical Medicine and Rehabilitation | Admitting: Physical Medicine and Rehabilitation

## 2012-01-22 VITALS — BP 140/45 | HR 68 | Resp 14 | Ht 67.0 in | Wt 221.0 lb

## 2012-01-22 DIAGNOSIS — M79609 Pain in unspecified limb: Secondary | ICD-10-CM | POA: Diagnosis not present

## 2012-01-22 DIAGNOSIS — I998 Other disorder of circulatory system: Secondary | ICD-10-CM | POA: Insufficient documentation

## 2012-01-22 DIAGNOSIS — M79671 Pain in right foot: Secondary | ICD-10-CM

## 2012-01-22 NOTE — Progress Notes (Signed)
Subjective:    Patient ID: Gavin [name], male    DOB: July 01, 1929, 78 y.o.   MRN: [id number]  HPI   The patient is an 78 year old man who has at least a six-month history of right foot pain located at the fifth metatarsal region. He is been followed at the wound center. He has a history of ischemia and has undergone hyperbaric treatments. He's been referred for pain management of this right foot pain.  Patient states pain is worse when there is pressure on the area. He had custom-made shoes which have recently been slit open over the fifth metatarsal area. This seems to have helped somewhat. And he sits in my office today he reports no pain complaints with the shoe off. He tells me he is not interested in oral pain medications.  He has tried lidocaine gel and Lidoderm patches. These have not been particularly helpful, he is also try hydrocodone which is not benefited him as well.    He reports no pain in the right foot today. He states that the current pair of shoes that he has exacerbates his right lateral foot pain. He also notes if the socks that he wears has a wrinkle in them it also increase his foot pain. If he is careful when he puts his socks time he has left foot pain.  His pain typically improves when he removes his shoe.  He has a new set of shoes ordered that are larger to accommodate his forefoot width.  His pain is intermittent and seems to be associated with pressure over the fifth metatarsal area.     Pain Inventory Average Pain 7 Pain Right Now 1 My pain is intermittent, sharp, burning and stabbing  In the last 24 hours, has pain interfered with the following? General activity 5 Relation with others 2 Enjoyment of life 5 What TIME of day is your pain at its worst? daytime Sleep (in general) Poor  Pain is worse with: walking Pain improves with: none Relief from Meds: 0  Mobility use a cane ability to climb steps?  yes do you drive?  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  no  Physicians involved in your care Any changes since last visit?  no       Review of Systems  Constitutional:       Easy bleeding, limb swelling, sleep apnea  HENT: Negative.   Eyes: Negative.   Respiratory: Negative.   Cardiovascular: Negative.   Gastrointestinal: Negative.   Genitourinary: Negative.   Musculoskeletal: Negative.   Skin: Negative.   Neurological: Negative.   Hematological: Negative.   Psychiatric/Behavioral: Positive for dysphoric mood.       Objective:   Physical Exam Feet are warm without edema. There is no area of erythema or tenderness today.  Palpation over the lateral right foot does not exacerbate pain.  He has intact sensation with light touch pinprick and vibratory sensation.  There is no side to side difference with respect to  Sensitivity.   Motor strength at hip flexors knee extensors dorsiflexors plantar flexors EHLNE inverters R5 over 5 without obvious focal deficit  Straight leg raise is negative  Patient transitions easily from sitting to standing.  Walking does not exacerbate pain today in his right foot  Rising up on his toes does not exacerbate pain and he displays good strength        04/03/11 "REPORT*  Clinical Data: Pain and swelling along the fifth toe.  MRI OF THE RIGHT FOREFOOT WITHOUT AND WITH CONTRAST  Technique: Multiplanar, multisequence MR imaging was performed  both before and after administration of intravenous contrast.  Contrast: 18 ml Multihance IV.  Comparison: Plain films 12/18/2010.  Findings: There is some soft tissue edema about the fifth  metatarsal and fifth MTP joint. Edema and enhancement appear most  intense between the heads of the fourth and fifth metatarsals. No  joint effusion is identified. No focal fluid collection to suggest  abscess is identified. There is also some subcutaneous  edema and  enhancement over the dorsum of the foot. There may be minimal  marrow edema in the head of the fifth metatarsal. Bone marrow  signal is otherwise unremarkable.  IMPRESSION:  1. Soft tissue edema and enhancement about the head of the fifth  metatarsal and between the fourth and fifth MTP joints could be due  to cellulitis or the presence of granulation tissue.  2. Negative for abscess. Minimal marrow edema in the head of the  fifth metatarsal is noted but does not have an appearance typical  of osteomyelitis. It may be reactive.  Original Report Authenticated By: [id number]  Assessment & Plan:    1. Right foot pain with history of ischemic vascular disease. Patient does not have pain today and it is not exacerbated with palpation or induced with activity. He is not interested in any kind of oral medication. His pain is intermittent and improves when he takes his right shoe off. He is waiting for a new pair of shoes to be fabricated which are larger.  2.anticoagulated-warfarin   Plan: Patient is not interested in oral pain medications at this time.   He understands the risks and benefits.   Would like to try shoe modification first.   Maintain contact with foot center monitor foot for evidence of infection. Patient will be in touch with foot and ankle center with [facility] to help with shoe modifications.   Patient is comfortable with this plan and we'll see him back on a when necessary basis.

## 2012-01-22 NOTE — Patient Instructions (Signed)
Please call the clinic again as needed for an appointment if your right foot pain returns.

## 2012-01-28 DIAGNOSIS — Z5189 Encounter for other specified aftercare: Secondary | ICD-10-CM | POA: Diagnosis not present

## 2012-01-28 DIAGNOSIS — N289 Disorder of kidney and ureter, unspecified: Secondary | ICD-10-CM | POA: Diagnosis not present

## 2012-01-28 DIAGNOSIS — I252 Old myocardial infarction: Secondary | ICD-10-CM | POA: Diagnosis not present

## 2012-02-03 DIAGNOSIS — I4891 Unspecified atrial fibrillation: Secondary | ICD-10-CM | POA: Diagnosis not present

## 2012-02-03 DIAGNOSIS — Z7901 Long term (current) use of anticoagulants: Secondary | ICD-10-CM | POA: Diagnosis not present

## 2012-02-04 DIAGNOSIS — R609 Edema, unspecified: Secondary | ICD-10-CM | POA: Diagnosis not present

## 2012-02-04 DIAGNOSIS — H35379 Puckering of macula, unspecified eye: Secondary | ICD-10-CM | POA: Diagnosis not present

## 2012-02-04 DIAGNOSIS — N289 Disorder of kidney and ureter, unspecified: Secondary | ICD-10-CM | POA: Diagnosis not present

## 2012-02-04 DIAGNOSIS — H348192 Central retinal vein occlusion, unspecified eye, stable: Secondary | ICD-10-CM | POA: Diagnosis not present

## 2012-02-14 DIAGNOSIS — I10 Essential (primary) hypertension: Secondary | ICD-10-CM | POA: Diagnosis not present

## 2012-02-14 DIAGNOSIS — R609 Edema, unspecified: Secondary | ICD-10-CM | POA: Diagnosis not present

## 2012-02-14 DIAGNOSIS — N289 Disorder of kidney and ureter, unspecified: Secondary | ICD-10-CM | POA: Diagnosis not present

## 2012-02-24 DIAGNOSIS — I4891 Unspecified atrial fibrillation: Secondary | ICD-10-CM | POA: Diagnosis not present

## 2012-02-24 DIAGNOSIS — Z5189 Encounter for other specified aftercare: Secondary | ICD-10-CM | POA: Diagnosis not present

## 2012-02-24 DIAGNOSIS — I252 Old myocardial infarction: Secondary | ICD-10-CM | POA: Diagnosis not present

## 2012-02-24 DIAGNOSIS — Z7901 Long term (current) use of anticoagulants: Secondary | ICD-10-CM | POA: Diagnosis not present

## 2012-02-26 DIAGNOSIS — L03119 Cellulitis of unspecified part of limb: Secondary | ICD-10-CM | POA: Diagnosis not present

## 2012-02-26 DIAGNOSIS — B351 Tinea unguium: Secondary | ICD-10-CM | POA: Diagnosis not present

## 2012-02-26 DIAGNOSIS — I70209 Unspecified atherosclerosis of native arteries of extremities, unspecified extremity: Secondary | ICD-10-CM | POA: Diagnosis not present

## 2012-02-26 DIAGNOSIS — I739 Peripheral vascular disease, unspecified: Secondary | ICD-10-CM | POA: Diagnosis not present

## 2012-03-03 DIAGNOSIS — L02619 Cutaneous abscess of unspecified foot: Secondary | ICD-10-CM | POA: Diagnosis not present

## 2012-03-03 DIAGNOSIS — L03119 Cellulitis of unspecified part of limb: Secondary | ICD-10-CM | POA: Diagnosis not present

## 2012-03-16 ENCOUNTER — Encounter: Payer: Self-pay | Admitting: Internal Medicine

## 2012-03-16 DIAGNOSIS — R339 Retention of urine, unspecified: Secondary | ICD-10-CM | POA: Diagnosis not present

## 2012-03-16 DIAGNOSIS — N3941 Urge incontinence: Secondary | ICD-10-CM | POA: Diagnosis not present

## 2012-03-17 DIAGNOSIS — L02619 Cutaneous abscess of unspecified foot: Secondary | ICD-10-CM | POA: Diagnosis not present

## 2012-03-17 DIAGNOSIS — L03119 Cellulitis of unspecified part of limb: Secondary | ICD-10-CM | POA: Diagnosis not present

## 2012-03-23 DIAGNOSIS — I252 Old myocardial infarction: Secondary | ICD-10-CM | POA: Diagnosis not present

## 2012-03-23 DIAGNOSIS — Z5189 Encounter for other specified aftercare: Secondary | ICD-10-CM | POA: Diagnosis not present

## 2012-03-23 DIAGNOSIS — Z7901 Long term (current) use of anticoagulants: Secondary | ICD-10-CM | POA: Diagnosis not present

## 2012-03-23 DIAGNOSIS — I4891 Unspecified atrial fibrillation: Secondary | ICD-10-CM | POA: Diagnosis not present

## 2012-04-02 DIAGNOSIS — I70219 Atherosclerosis of native arteries of extremities with intermittent claudication, unspecified extremity: Secondary | ICD-10-CM | POA: Diagnosis not present

## 2012-04-06 DIAGNOSIS — I2510 Atherosclerotic heart disease of native coronary artery without angina pectoris: Secondary | ICD-10-CM | POA: Diagnosis not present

## 2012-04-06 DIAGNOSIS — R609 Edema, unspecified: Secondary | ICD-10-CM | POA: Diagnosis not present

## 2012-04-06 DIAGNOSIS — G4733 Obstructive sleep apnea (adult) (pediatric): Secondary | ICD-10-CM | POA: Diagnosis not present

## 2012-04-10 DIAGNOSIS — H353190 Nonexudative age-related macular degeneration, unspecified eye, stage unspecified: Secondary | ICD-10-CM | POA: Diagnosis not present

## 2012-04-10 DIAGNOSIS — H2510 Age-related nuclear cataract, unspecified eye: Secondary | ICD-10-CM | POA: Diagnosis not present

## 2012-04-14 DIAGNOSIS — E7800 Pure hypercholesterolemia, unspecified: Secondary | ICD-10-CM | POA: Diagnosis not present

## 2012-04-14 DIAGNOSIS — R945 Abnormal results of liver function studies: Secondary | ICD-10-CM | POA: Diagnosis not present

## 2012-04-14 DIAGNOSIS — Z79899 Other long term (current) drug therapy: Secondary | ICD-10-CM | POA: Diagnosis not present

## 2012-04-16 DIAGNOSIS — L03119 Cellulitis of unspecified part of limb: Secondary | ICD-10-CM | POA: Diagnosis not present

## 2012-04-16 DIAGNOSIS — L02619 Cutaneous abscess of unspecified foot: Secondary | ICD-10-CM | POA: Diagnosis not present

## 2012-04-20 DIAGNOSIS — E559 Vitamin D deficiency, unspecified: Secondary | ICD-10-CM | POA: Diagnosis not present

## 2012-04-20 DIAGNOSIS — N289 Disorder of kidney and ureter, unspecified: Secondary | ICD-10-CM | POA: Diagnosis not present

## 2012-04-22 DIAGNOSIS — I4891 Unspecified atrial fibrillation: Secondary | ICD-10-CM | POA: Diagnosis not present

## 2012-04-22 DIAGNOSIS — Z7901 Long term (current) use of anticoagulants: Secondary | ICD-10-CM | POA: Diagnosis not present

## 2012-04-30 DIAGNOSIS — R51 Headache: Secondary | ICD-10-CM | POA: Diagnosis not present

## 2012-05-21 DIAGNOSIS — I4891 Unspecified atrial fibrillation: Secondary | ICD-10-CM | POA: Diagnosis not present

## 2012-05-21 DIAGNOSIS — Z7901 Long term (current) use of anticoagulants: Secondary | ICD-10-CM | POA: Diagnosis not present

## 2012-05-29 DIAGNOSIS — I6529 Occlusion and stenosis of unspecified carotid artery: Secondary | ICD-10-CM | POA: Diagnosis not present

## 2012-05-29 HISTORY — PX: OTHER SURGICAL HISTORY: SHX169

## 2012-06-01 DIAGNOSIS — R51 Headache: Secondary | ICD-10-CM | POA: Diagnosis not present

## 2012-06-01 DIAGNOSIS — Z23 Encounter for immunization: Secondary | ICD-10-CM | POA: Diagnosis not present

## 2012-06-18 DIAGNOSIS — I4891 Unspecified atrial fibrillation: Secondary | ICD-10-CM | POA: Diagnosis not present

## 2012-06-18 DIAGNOSIS — Z7901 Long term (current) use of anticoagulants: Secondary | ICD-10-CM | POA: Diagnosis not present

## 2012-07-07 DIAGNOSIS — J329 Chronic sinusitis, unspecified: Secondary | ICD-10-CM | POA: Diagnosis not present

## 2012-07-07 DIAGNOSIS — R51 Headache: Secondary | ICD-10-CM | POA: Diagnosis not present

## 2012-07-10 DIAGNOSIS — J301 Allergic rhinitis due to pollen: Secondary | ICD-10-CM | POA: Diagnosis not present

## 2012-07-10 DIAGNOSIS — J069 Acute upper respiratory infection, unspecified: Secondary | ICD-10-CM | POA: Diagnosis not present

## 2012-07-14 DIAGNOSIS — J329 Chronic sinusitis, unspecified: Secondary | ICD-10-CM | POA: Diagnosis not present

## 2012-07-16 DIAGNOSIS — Z7901 Long term (current) use of anticoagulants: Secondary | ICD-10-CM | POA: Diagnosis not present

## 2012-07-16 DIAGNOSIS — I4891 Unspecified atrial fibrillation: Secondary | ICD-10-CM | POA: Diagnosis not present

## 2012-07-17 DIAGNOSIS — M545 Low back pain: Secondary | ICD-10-CM | POA: Diagnosis not present

## 2012-07-28 DIAGNOSIS — I70209 Unspecified atherosclerosis of native arteries of extremities, unspecified extremity: Secondary | ICD-10-CM | POA: Diagnosis not present

## 2012-07-28 DIAGNOSIS — B351 Tinea unguium: Secondary | ICD-10-CM | POA: Diagnosis not present

## 2012-08-04 DIAGNOSIS — H2510 Age-related nuclear cataract, unspecified eye: Secondary | ICD-10-CM | POA: Diagnosis not present

## 2012-08-04 DIAGNOSIS — H348192 Central retinal vein occlusion, unspecified eye, stable: Secondary | ICD-10-CM | POA: Diagnosis not present

## 2012-08-04 DIAGNOSIS — H35379 Puckering of macula, unspecified eye: Secondary | ICD-10-CM | POA: Diagnosis not present

## 2012-08-13 DIAGNOSIS — Z7901 Long term (current) use of anticoagulants: Secondary | ICD-10-CM | POA: Diagnosis not present

## 2012-08-13 DIAGNOSIS — I4891 Unspecified atrial fibrillation: Secondary | ICD-10-CM | POA: Diagnosis not present

## 2012-09-01 DIAGNOSIS — R51 Headache: Secondary | ICD-10-CM | POA: Diagnosis not present

## 2012-09-10 DIAGNOSIS — Z7901 Long term (current) use of anticoagulants: Secondary | ICD-10-CM | POA: Diagnosis not present

## 2012-09-10 DIAGNOSIS — I4891 Unspecified atrial fibrillation: Secondary | ICD-10-CM | POA: Diagnosis not present

## 2012-09-11 ENCOUNTER — Ambulatory Visit (INDEPENDENT_AMBULATORY_CARE_PROVIDER_SITE_OTHER)
Admission: RE | Admit: 2012-09-11 | Discharge: 2012-09-11 | Disposition: A | Discharge status: Home / Self Care | Payer: Medicare Other | Source: Ambulatory Visit | Attending: Internal Medicine | Admitting: Internal Medicine

## 2012-09-11 ENCOUNTER — Encounter: Payer: Self-pay | Admitting: Internal Medicine

## 2012-09-11 ENCOUNTER — Ambulatory Visit (INDEPENDENT_AMBULATORY_CARE_PROVIDER_SITE_OTHER): Payer: Medicare Other | Admitting: Internal Medicine

## 2012-09-11 VITALS — BP 130/60 | HR 70 | Ht 67.0 in | Wt 219.8 lb

## 2012-09-11 DIAGNOSIS — J984 Other disorders of lung: Secondary | ICD-10-CM

## 2012-09-11 DIAGNOSIS — G4733 Obstructive sleep apnea (adult) (pediatric): Secondary | ICD-10-CM | POA: Diagnosis not present

## 2012-09-11 DIAGNOSIS — J449 Chronic obstructive pulmonary disease, unspecified: Secondary | ICD-10-CM

## 2012-09-11 NOTE — Progress Notes (Signed)
09/13/11 -78 year old male former smoker followed for obstructive sleep apnea, COPD, complicated by GERD, CAD, history of lung nodule LOV- 09/13/2010 PCP Dr Lucila Maine. Has had flu vaccine and shingles vaccine this year. Getting hyperbaric treatment for circulatory ischemia/wound right foot. He continues CPAP 9/[facility] using a nasal pillows mask. He is able to use this all night every night. We discussed his history of COPD-he quit smoking many years ago. He denies any wheeze, cough, or shortness of breath with his routine activities, recognizing that exertion is limited. I asked about his lung nodule. He had a CT scan at [place]. We will request that report.  09/11/12-78 year old male former smoker followed for obstructive sleep apnea, COPD, complicated by GERD, CAD, history of lung nodule  Wife is here FOLLOWS FOR: wears CPAP 9/ [facility] every night for about 6-7 hours each night and pressure working well for patient. No snoring through his mask. He continues cardiac rehabilitation 3 times per week and denies shortness of breath wheeze or cough. He is being evaluated for chronic left sided headaches. Workup for sinusitis was negative.  ROS-see HPI Constitutional:   No-   weight loss, night sweats, fevers, chills, fatigue, lassitude. HEENT:   + headaches, no- difficulty swallowing, tooth/dental problems, sore throat,       No-  sneezing, itching, ear ache, nasal congestion, post nasal drip,  CV:  No- acute  chest pain, orthopnea, PND, swelling in lower extremities, anasarca, dizziness, palpitations Resp: No- acute  shortness of breath with exertion or at rest.              No-   productive cough,  No non-productive cough,  No- coughing up of blood.              No-   change in color of mucus.  No- wheezing.   Skin: No-   rash or lesions. GI:  No-   heartburn, indigestion, abdominal pain, nausea, vomiting,  GU:  MS:  Neuro-     nothing unusual Psych:  No-  change in mood or affect. No depression or anxiety.  No memory loss.  OBJ General- Alert, Oriented, Affect-appropriate, Distress- none acute Skin- rash-none, lesions- none, excoriation- none Lymphadenopathy- none Head- atraumatic            Eyes- Gross vision intact, PERRLA, conjunctivae clear secretions            Ears- Hearing, canals-normal            Nose- Clear, no-Septal dev, mucus, polyps, erosion, perforation             Throat- Mallampati II-III , mucosa clear , drainage- none, tonsils- atrophic Neck- flexible , trachea midline, no stridor , thyroid nl, carotid no bruit Chest - symmetrical excursion , unlabored           Heart/CV- RRR , no murmur , no gallop  , no rub, nl s1 s2                           - JVD- none , edema- none, stasis changes- none, varices- none           Lung- very diminished, unlabored, +few crackles, wheeze- none, cough- none , dullness-none, rub- none           Chest wall-  Abd-  Br/ Gen/ Rectal- Not done, not indicated Extrem- cyanosis- none, clubbing, none, atrophy- none, strength- nl, right foot is in  a boot Neuro- grossly intact to observation

## 2012-09-11 NOTE — Patient Instructions (Addendum)
Order- CXR    Dx hx of COPD  We can continue CPAP 9/ [facility]

## 2012-09-14 DIAGNOSIS — R51 Headache: Secondary | ICD-10-CM | POA: Diagnosis not present

## 2012-09-23 NOTE — Assessment & Plan Note (Signed)
Good compliance and control with CPAP 9/[facility]

## 2012-09-23 NOTE — Assessment & Plan Note (Signed)
Plan-chest x-ray 

## 2012-10-01 ENCOUNTER — Telehealth: Payer: Self-pay | Admitting: Internal Medicine

## 2012-10-01 NOTE — Telephone Encounter (Signed)
Result Note     Stable clear chest xray. No new or active process.  ---  I spoke with patient about results and he verbalized understanding and had no questions

## 2012-10-02 DIAGNOSIS — I10 Essential (primary) hypertension: Secondary | ICD-10-CM | POA: Diagnosis not present

## 2012-10-02 DIAGNOSIS — R609 Edema, unspecified: Secondary | ICD-10-CM | POA: Diagnosis not present

## 2012-10-08 DIAGNOSIS — Z7901 Long term (current) use of anticoagulants: Secondary | ICD-10-CM | POA: Diagnosis not present

## 2012-10-08 DIAGNOSIS — I4891 Unspecified atrial fibrillation: Secondary | ICD-10-CM | POA: Diagnosis not present

## 2012-10-12 DIAGNOSIS — H524 Presbyopia: Secondary | ICD-10-CM | POA: Diagnosis not present

## 2012-10-12 DIAGNOSIS — H2589 Other age-related cataract: Secondary | ICD-10-CM | POA: Diagnosis not present

## 2012-10-28 ENCOUNTER — Other Ambulatory Visit: Payer: Self-pay | Admitting: Internal Medicine

## 2012-10-28 MED ORDER — ZALEPLON 10 MG PO CAPS: ORAL_CAPSULE | ORAL | Status: DC

## 2012-10-28 NOTE — Telephone Encounter (Signed)
Ok to refill for 6 months, then call again

## 2012-10-28 NOTE — Telephone Encounter (Signed)
CY Please advise if okay to refill. Thanks.  

## 2012-10-29 DIAGNOSIS — R51 Headache: Secondary | ICD-10-CM | POA: Diagnosis not present

## 2012-11-03 DIAGNOSIS — I70209 Unspecified atherosclerosis of native arteries of extremities, unspecified extremity: Secondary | ICD-10-CM | POA: Diagnosis not present

## 2012-11-03 DIAGNOSIS — B351 Tinea unguium: Secondary | ICD-10-CM | POA: Diagnosis not present

## 2012-11-04 DIAGNOSIS — Z7901 Long term (current) use of anticoagulants: Secondary | ICD-10-CM | POA: Diagnosis not present

## 2012-11-04 DIAGNOSIS — I4891 Unspecified atrial fibrillation: Secondary | ICD-10-CM | POA: Diagnosis not present

## 2012-11-12 DIAGNOSIS — R51 Headache: Secondary | ICD-10-CM | POA: Diagnosis not present

## 2012-11-20 DIAGNOSIS — Z79899 Other long term (current) drug therapy: Secondary | ICD-10-CM | POA: Diagnosis not present

## 2012-12-02 DIAGNOSIS — Z7901 Long term (current) use of anticoagulants: Secondary | ICD-10-CM | POA: Diagnosis not present

## 2012-12-02 DIAGNOSIS — I4891 Unspecified atrial fibrillation: Secondary | ICD-10-CM | POA: Diagnosis not present

## 2012-12-10 ENCOUNTER — Ambulatory Visit: Payer: Self-pay | Admitting: Cardiovascular Disease

## 2012-12-10 DIAGNOSIS — I480 Paroxysmal atrial fibrillation: Secondary | ICD-10-CM | POA: Insufficient documentation

## 2012-12-23 DIAGNOSIS — Z7901 Long term (current) use of anticoagulants: Secondary | ICD-10-CM | POA: Diagnosis not present

## 2012-12-23 DIAGNOSIS — I4891 Unspecified atrial fibrillation: Secondary | ICD-10-CM | POA: Diagnosis not present

## 2012-12-31 DIAGNOSIS — E782 Mixed hyperlipidemia: Secondary | ICD-10-CM | POA: Diagnosis not present

## 2012-12-31 DIAGNOSIS — I10 Essential (primary) hypertension: Secondary | ICD-10-CM | POA: Diagnosis not present

## 2012-12-31 DIAGNOSIS — Z0000 Encounter for general adult medical examination without abnormal findings: Secondary | ICD-10-CM | POA: Diagnosis not present

## 2012-12-31 DIAGNOSIS — Z79899 Other long term (current) drug therapy: Secondary | ICD-10-CM | POA: Diagnosis not present

## 2012-12-31 DIAGNOSIS — E559 Vitamin D deficiency, unspecified: Secondary | ICD-10-CM | POA: Diagnosis not present

## 2012-12-31 DIAGNOSIS — R51 Headache: Secondary | ICD-10-CM | POA: Diagnosis not present

## 2012-12-31 DIAGNOSIS — Z125 Encounter for screening for malignant neoplasm of prostate: Secondary | ICD-10-CM | POA: Diagnosis not present

## 2013-01-04 DIAGNOSIS — R51 Headache: Secondary | ICD-10-CM | POA: Diagnosis not present

## 2013-01-20 DIAGNOSIS — Z7901 Long term (current) use of anticoagulants: Secondary | ICD-10-CM | POA: Diagnosis not present

## 2013-01-20 DIAGNOSIS — I4891 Unspecified atrial fibrillation: Secondary | ICD-10-CM | POA: Diagnosis not present

## 2013-01-25 DIAGNOSIS — M899 Disorder of bone, unspecified: Secondary | ICD-10-CM | POA: Diagnosis not present

## 2013-01-25 DIAGNOSIS — M949 Disorder of cartilage, unspecified: Secondary | ICD-10-CM | POA: Diagnosis not present

## 2013-02-04 DIAGNOSIS — H35379 Puckering of macula, unspecified eye: Secondary | ICD-10-CM | POA: Diagnosis not present

## 2013-02-04 DIAGNOSIS — H2510 Age-related nuclear cataract, unspecified eye: Secondary | ICD-10-CM | POA: Diagnosis not present

## 2013-02-04 DIAGNOSIS — H348192 Central retinal vein occlusion, unspecified eye, stable: Secondary | ICD-10-CM | POA: Diagnosis not present

## 2013-02-09 ENCOUNTER — Other Ambulatory Visit: Payer: Self-pay | Admitting: *Deleted

## 2013-02-09 MED ORDER — ISOSORBIDE MONONITRATE ER 30 MG PO TB24: ORAL_TABLET | ORAL | Status: DC

## 2013-02-09 NOTE — Telephone Encounter (Signed)
rx sent to pharmacy by e-script  

## 2013-02-11 DIAGNOSIS — I70209 Unspecified atherosclerosis of native arteries of extremities, unspecified extremity: Secondary | ICD-10-CM | POA: Diagnosis not present

## 2013-02-11 DIAGNOSIS — B351 Tinea unguium: Secondary | ICD-10-CM | POA: Diagnosis not present

## 2013-02-17 ENCOUNTER — Ambulatory Visit: Payer: Medicare Other | Admitting: Pharmacist Clinician (PhC)/ Clinical Pharmacy Specialist

## 2013-02-18 ENCOUNTER — Ambulatory Visit (INDEPENDENT_AMBULATORY_CARE_PROVIDER_SITE_OTHER): Payer: Medicare Other | Admitting: Pharmacist Clinician (PhC)/ Clinical Pharmacy Specialist

## 2013-02-18 VITALS — BP 140/72 | HR 68

## 2013-02-18 DIAGNOSIS — I4891 Unspecified atrial fibrillation: Secondary | ICD-10-CM | POA: Diagnosis not present

## 2013-02-18 DIAGNOSIS — Z7901 Long term (current) use of anticoagulants: Secondary | ICD-10-CM | POA: Diagnosis not present

## 2013-02-20 ENCOUNTER — Other Ambulatory Visit (HOSPITAL_COMMUNITY): Payer: Self-pay | Admitting: Cardiovascular Disease

## 2013-02-24 ENCOUNTER — Other Ambulatory Visit (HOSPITAL_COMMUNITY): Payer: Self-pay | Admitting: Cardiovascular Disease

## 2013-03-04 DIAGNOSIS — IMO0001 Reserved for inherently not codable concepts without codable children: Secondary | ICD-10-CM | POA: Diagnosis not present

## 2013-03-04 DIAGNOSIS — G4700 Insomnia, unspecified: Secondary | ICD-10-CM | POA: Diagnosis not present

## 2013-03-05 DIAGNOSIS — Z006 Encounter for examination for normal comparison and control in clinical research program: Secondary | ICD-10-CM | POA: Diagnosis not present

## 2013-03-05 DIAGNOSIS — I10 Essential (primary) hypertension: Secondary | ICD-10-CM | POA: Diagnosis not present

## 2013-03-05 DIAGNOSIS — E782 Mixed hyperlipidemia: Secondary | ICD-10-CM | POA: Diagnosis not present

## 2013-03-08 DIAGNOSIS — R51 Headache: Secondary | ICD-10-CM | POA: Diagnosis not present

## 2013-03-13 ENCOUNTER — Encounter: Payer: Self-pay | Admitting: *Deleted

## 2013-03-15 ENCOUNTER — Encounter: Payer: Self-pay | Admitting: Cardiovascular Disease

## 2013-03-18 ENCOUNTER — Encounter: Payer: Self-pay | Admitting: Cardiology

## 2013-03-18 ENCOUNTER — Ambulatory Visit (INDEPENDENT_AMBULATORY_CARE_PROVIDER_SITE_OTHER): Payer: Medicare Other | Admitting: Cardiology

## 2013-03-18 ENCOUNTER — Telehealth: Payer: Self-pay | Admitting: *Deleted

## 2013-03-18 ENCOUNTER — Ambulatory Visit (INDEPENDENT_AMBULATORY_CARE_PROVIDER_SITE_OTHER): Payer: Medicare Other | Admitting: Pharmacist Clinician (PhC)/ Clinical Pharmacy Specialist

## 2013-03-18 ENCOUNTER — Encounter (HOSPITAL_COMMUNITY): Payer: Self-pay | Admitting: Cardiovascular Disease

## 2013-03-18 VITALS — BP 138/60 | HR 64 | Ht 67.0 in | Wt 214.9 lb

## 2013-03-18 DIAGNOSIS — Z7901 Long term (current) use of anticoagulants: Secondary | ICD-10-CM

## 2013-03-18 DIAGNOSIS — G4733 Obstructive sleep apnea (adult) (pediatric): Secondary | ICD-10-CM

## 2013-03-18 DIAGNOSIS — I2510 Atherosclerotic heart disease of native coronary artery without angina pectoris: Secondary | ICD-10-CM

## 2013-03-18 DIAGNOSIS — I739 Peripheral vascular disease, unspecified: Secondary | ICD-10-CM | POA: Diagnosis not present

## 2013-03-18 DIAGNOSIS — I4891 Unspecified atrial fibrillation: Secondary | ICD-10-CM | POA: Diagnosis not present

## 2013-03-18 DIAGNOSIS — R443 Hallucinations, unspecified: Secondary | ICD-10-CM | POA: Insufficient documentation

## 2013-03-18 DIAGNOSIS — I6529 Occlusion and stenosis of unspecified carotid artery: Secondary | ICD-10-CM | POA: Diagnosis not present

## 2013-03-18 DIAGNOSIS — R339 Retention of urine, unspecified: Secondary | ICD-10-CM | POA: Diagnosis not present

## 2013-03-18 DIAGNOSIS — I480 Paroxysmal atrial fibrillation: Secondary | ICD-10-CM

## 2013-03-18 DIAGNOSIS — R51 Headache: Secondary | ICD-10-CM

## 2013-03-18 DIAGNOSIS — R519 Headache, unspecified: Secondary | ICD-10-CM | POA: Insufficient documentation

## 2013-03-18 DIAGNOSIS — I658 Occlusion and stenosis of other precerebral arteries: Secondary | ICD-10-CM

## 2013-03-18 DIAGNOSIS — I6523 Occlusion and stenosis of bilateral carotid arteries: Secondary | ICD-10-CM

## 2013-03-18 DIAGNOSIS — N3941 Urge incontinence: Secondary | ICD-10-CM | POA: Diagnosis not present

## 2013-03-18 DIAGNOSIS — R39198 Other difficulties with micturition: Secondary | ICD-10-CM | POA: Diagnosis not present

## 2013-03-18 DIAGNOSIS — E785 Hyperlipidemia, unspecified: Secondary | ICD-10-CM

## 2013-03-18 LAB — POCT INR: INR: 2.6

## 2013-03-18 NOTE — Telephone Encounter (Signed)
Pt returned my call.  Discussed getting a CT of the head and gave him the reasoning per Nada Boozer, NP.  Pt. Voiced understanding and will go to [facility].  He will call to make the appt.so he doesn't have to wait.

## 2013-03-18 NOTE — Assessment & Plan Note (Signed)
Increased in severity of headaches, on chronic coumadin.

## 2013-03-18 NOTE — Progress Notes (Signed)
03/18/2013   PCP: Lucila Maine, MD   Chief Complaint  Patient presents with  . Follow-up     3 months:  no chest pain, no SOB, occas. edema.  Hallucinations at night - PCP changing HS med.  Decreased appetite    Primary Cardiologist:Dr. Allyson Sabal  HPI: 79 year-old white married male presents today for cardiology followup. He has a history of coronary disease with last cardiac catheterization July 2010 revealing an occluded LAD stent otherwise normal coronary arteries with at that time reduced EF of 30% subsequent 2-D echo in 2013 revealed EF is normal. He also has a history of PAF on Coumadin for anticoagulation and maintain sinus rhythm. He has peripheral vascular disease with stenting by Dr. Allyson Sabal to both SFAs in 2004.  0 vessel runoff on the right and one on the left via peroneal Dopplers are followed he also has bilateral carotid disease left greater than right which we followed by duplex ultrasound through our office.  Other history includes hypertension hyperlipidemia obstructive sleep apnea on CPAP.   Recently his primary care as been following him for her complaints of hallucinations seeing people in the room with him as well as decreased appetite and pain all over his body.  His Lipitor is now on hold to see if this will improve his symptoms.  Decreased appetite- he has by our scales dropped 10 pounds.  No chest pain no shortness of breath.  He also complains of chronic headaches.  Allergies  Allergen Reactions  . Ciprofloxacin   . Codeine   . Sulfonamide Derivatives   . Telmisartan     Current Outpatient Prescriptions  Medication Sig Dispense Refill  . aspirin 81 MG tablet Take 81 mg by mouth daily.      . cholecalciferol (VITAMIN D) 400 UNITS TABS Take 400 Units by mouth.      . cloNIDine (CATAPRES) 0.1 MG tablet Take 1 tablet by mouth Twice daily.      . clorazepate (TRANXENE) 7.5 MG tablet Take 7.5 mg by mouth 2 (two) times daily as needed.      . enalapril  (VASOTEC) 20 MG tablet Take 20 mg by mouth daily.      Marland Kitchen Fesoterodine Fumarate (TOVIAZ) 8 MG TB24 Take 8 mg by mouth daily.      . furosemide (LASIX) 40 MG tablet Take 1 tablet by mouth daily.      . isosorbide mononitrate (IMDUR) 30 MG 24 hr tablet Take 30 mg by mouth. Take 2 tablets by mouth every morning and 1 tablet by mouth every evening      . KLOR-CON M10 10 MEQ tablet 10 mEq every other day.       . loratadine (CLARITIN) 10 MG tablet Take 10 mg by mouth daily.        . metoprolol (LOPRESSOR) 50 MG tablet take 1 tablet by mouth twice a day  60 tablet  11  . Misc Natural Products (LUTEIN VISION BLEND PO) Take 1 tablet by mouth 2 (two) times daily.      . Multiple Vitamin (MULTIVITAMIN) tablet Take 1 tablet by mouth daily.      Marland Kitchen NASONEX 50 MCG/ACT nasal spray       . nitroGLYCERIN (NITROSTAT) 0.4 MG SL tablet Place 0.4 mg under the tongue every 5 (five) minutes as needed.      . pantoprazole (PROTONIX) 40 MG tablet Take 1 tablet by mouth daily.      . sertraline (ZOLOFT)  25 MG tablet Take 25 tablets by mouth daily.      . vitamin B-12 (CYANOCOBALAMIN) 1000 MCG tablet Take 1,000 mcg by mouth daily.      Marland Kitchen warfarin (COUMADIN) 5 MG tablet Take as directed      . zaleplon (SONATA) 10 MG capsule take 1 capsule by mouth at bedtime if needed for sleep  30 capsule  5  . atorvastatin (LIPITOR) 40 MG tablet take 1 tablet by mouth at bedtime  31 tablet  6   No current facility-administered medications for this visit.    Past Medical History  Diagnosis Date  . OSA (obstructive sleep apnea)     NPSG 05-05-99 RDI 65/hr-CPAP  . Chronic airway obstruction, not elsewhere classified   . Lung nodules     CT 07-01-10 stable @ [place]  . CAD (coronary artery disease)   . Reflux esophagitis   . GERD (gastroesophageal reflux disease)   . Hypertension   . Hyperlipidemia   . PVD (peripheral vascular disease) 2004    stenting of both SFA's 2004  . Carotid disease, bilateral     left greater than right   . STEMI (ST elevation myocardial infarction) 09/20/2008    Past Surgical History  Procedure Laterality Date  . Right rotator cuff    . Appendectomy    . Tonsillectomy    . Coronary angioplasty with stent placement  06/13/2004    successful cutting balloon atherectomy/PIi & STENTING mid LAD & POBA of the distal LAD  . Coronary angioplasty with stent placement  06/03/2006    successful PTCA & stent mid LAD  . Coronary angioplasty with stent placement  06/06/2006    successful double stenting of the ostial and prox RCA, successful PTCA LAD of the ostium major diag branch due to jailing by a previously placed LAD stent,successful mid kiagonal branch primary stenting  . Coronary angioplasty with stent placement  09/20/2008    PTCA LAD stent  . Cardiac catheterization  10/03/2008    Occluded LAD  . US echocardiography  12/24/2011    trace MR,TR,AI,PI. EF >55%  . Nm myoview ltd  08/01/2011    Abnormal-intermed to high risk based upon per-infarct ischemia  . Carotid doppler  05/29/2012    bilateral ICA stenosis    UJW:JXBJYNW:GN colds or fevers, weight changes down 10 pounds due to decrease in appetite  Skin:no rashes or ulcers HEENT:no blurred vision, no congestion CV:see HPI PUL:see HPI GI:no diarrhea constipation or melena, no indigestion GU:no hematuria, no dysuria MS:no joint pain, ? Claudication with pain in his legs at night with rest, none with exertion. Neuro:no syncope, no lightheadedness, + hallucinations Endo:no diabetes, no thyroid disease  PHYSICAL EXAM BP 138/60  Pulse 64  Ht 5\' 7"  (1.702 m)  Wt 214 lb 14.4 oz (97.478 kg)  BMI 33.65 kg/m2 General:Pleasant affect, NAD Skin:Warm and dry, brisk capillary refill HEENT:normocephalic, sclera clear, mucus membranes moist Neck:supple, no JVD, no bruits  Heart:S1S2 RRR without murmur, gallup, rub or click Lungs:clear without rales, rhonchi, or wheezes FAO:ZHYQ, non tender, + BS, do not palpate liver spleen or masses Ext:no  lower ext edema, 2+ pedal pulses, 2+ radial pulses Neuro:alert and oriented, MAE, follows commands, + facial symmetry   ASSESSMENT AND PLAN CAD Stable coronary disease without chest pain with normal EF.   PAF (paroxysmal atrial fibrillation) Maintaining sinus rhythm.  PAD (peripheral artery disease), history of stenting to both SFAs Has leg pain with rest more at night it is time for his  lower extremity Dopplers we will go ahead and schedule. Additionally it is time for carotid Dopplers as well.  OBSTRUCTIVE SLEEP APNEA stable  Hyperlipidemia LDL goal < 70 Currently off statin secondary to hallucinations and decreased appetite. Cholesterol followed by Dr. Lorin Picket.  Chronic anticoagulation, on coumadin Stable, no bleeding  Headache Increased in severity of headaches, on chronic coumadin.  Hallucinations Seeing people in room not present.    Since patient is on chronic Coumadin and he does complain of a headache that has been continuous and other abnormalities we will proceed with CT of his head without contrast to ensure no bleeding.

## 2013-03-18 NOTE — Assessment & Plan Note (Signed)
Stable, no bleeding.  ?

## 2013-03-18 NOTE — Assessment & Plan Note (Signed)
Currently off statin secondary to hallucinations and decreased appetite. Cholesterol followed by Dr. Lorin Picket.

## 2013-03-18 NOTE — Assessment & Plan Note (Signed)
Seeing people in room not present.

## 2013-03-18 NOTE — Assessment & Plan Note (Signed)
Stable coronary disease without chest pain with normal EF.

## 2013-03-18 NOTE — Telephone Encounter (Signed)
Returning your call. °

## 2013-03-18 NOTE — Assessment & Plan Note (Signed)
Maintaining sinus rhythm 

## 2013-03-18 NOTE — Assessment & Plan Note (Signed)
Has leg pain with rest more at night it is time for his lower extremity Dopplers we will go ahead and schedule. Additionally it is time for carotid Dopplers as well.

## 2013-03-18 NOTE — Patient Instructions (Signed)
Eat heart healthy while off Liptor.  Follow up with Dr. Allyson Sabal in 3 months.  He will review results of dopplers.  We are scheduling lower ext and carotid dopplers for you.

## 2013-03-18 NOTE — Assessment & Plan Note (Signed)
stable °

## 2013-03-25 ENCOUNTER — Ambulatory Visit (HOSPITAL_COMMUNITY)
Admission: RE | Admit: 2013-03-25 | Discharge: 2013-03-25 | Disposition: A | Discharge status: Home / Self Care | Payer: Medicare Other | Source: Ambulatory Visit | Attending: Cardiology | Admitting: Cardiology

## 2013-03-25 ENCOUNTER — Encounter (HOSPITAL_COMMUNITY): Payer: Self-pay

## 2013-03-25 DIAGNOSIS — I6789 Other cerebrovascular disease: Secondary | ICD-10-CM | POA: Diagnosis not present

## 2013-03-25 DIAGNOSIS — G319 Degenerative disease of nervous system, unspecified: Secondary | ICD-10-CM | POA: Diagnosis not present

## 2013-03-25 DIAGNOSIS — R51 Headache: Secondary | ICD-10-CM | POA: Insufficient documentation

## 2013-03-25 DIAGNOSIS — G9389 Other specified disorders of brain: Secondary | ICD-10-CM | POA: Diagnosis not present

## 2013-03-25 DIAGNOSIS — Z7901 Long term (current) use of anticoagulants: Secondary | ICD-10-CM

## 2013-03-25 DIAGNOSIS — R443 Hallucinations, unspecified: Secondary | ICD-10-CM

## 2013-03-29 ENCOUNTER — Telehealth: Payer: Self-pay | Admitting: *Deleted

## 2013-03-29 ENCOUNTER — Other Ambulatory Visit: Payer: Self-pay | Admitting: *Deleted

## 2013-03-29 ENCOUNTER — Telehealth: Payer: Self-pay | Admitting: Cardiovascular Disease

## 2013-03-29 MED ORDER — ENALAPRIL MALEATE 20 MG PO TABS: 30.0000 mg | ORAL_TABLET | Freq: Every day | ORAL | Status: DC

## 2013-03-29 NOTE — Telephone Encounter (Signed)
Spoke to patient. Informed him of resultsof the CT . voiced understanding.

## 2013-03-29 NOTE — Telephone Encounter (Signed)
Call patient about his CT scan as per laura he had done patient verbally understand.

## 2013-03-29 NOTE — Telephone Encounter (Signed)
Message copied by Tobin Chad on Mon Mar 29, 2013  4:19 PM ------      Message from: Leone Brand      Created: Thu Mar 25, 2013  5:14 PM       Let pt know no bleeding in his head to cause hallucinationsl ------

## 2013-03-29 NOTE — Telephone Encounter (Signed)
Message forwarded to K. Vogel, RN.  

## 2013-03-29 NOTE — Telephone Encounter (Signed)
Gavin [name] is calling for his test results    Thanks

## 2013-04-01 ENCOUNTER — Ambulatory Visit (HOSPITAL_COMMUNITY)
Admission: RE | Admit: 2013-04-01 | Discharge: 2013-04-01 | Disposition: A | Discharge status: Home / Self Care | Payer: Medicare Other | Source: Ambulatory Visit | Attending: Cardiovascular Disease | Admitting: Cardiovascular Disease

## 2013-04-01 DIAGNOSIS — I658 Occlusion and stenosis of other precerebral arteries: Secondary | ICD-10-CM | POA: Insufficient documentation

## 2013-04-01 DIAGNOSIS — I6523 Occlusion and stenosis of bilateral carotid arteries: Secondary | ICD-10-CM

## 2013-04-01 DIAGNOSIS — I6529 Occlusion and stenosis of unspecified carotid artery: Secondary | ICD-10-CM | POA: Diagnosis not present

## 2013-04-01 NOTE — Progress Notes (Signed)
Carotid Duplex Completed. Hermila Millis, RDMS, RVT  

## 2013-04-08 ENCOUNTER — Ambulatory Visit (INDEPENDENT_AMBULATORY_CARE_PROVIDER_SITE_OTHER): Payer: Medicare Other | Admitting: Pharmacist Clinician (PhC)/ Clinical Pharmacy Specialist

## 2013-04-08 VITALS — BP 162/70 | HR 72

## 2013-04-08 DIAGNOSIS — I4891 Unspecified atrial fibrillation: Secondary | ICD-10-CM | POA: Diagnosis not present

## 2013-04-08 DIAGNOSIS — I480 Paroxysmal atrial fibrillation: Secondary | ICD-10-CM

## 2013-04-08 DIAGNOSIS — Z7901 Long term (current) use of anticoagulants: Secondary | ICD-10-CM | POA: Diagnosis not present

## 2013-04-08 LAB — POCT INR: INR: 2.6

## 2013-04-19 DIAGNOSIS — R51 Headache: Secondary | ICD-10-CM | POA: Diagnosis not present

## 2013-04-20 DIAGNOSIS — H2510 Age-related nuclear cataract, unspecified eye: Secondary | ICD-10-CM | POA: Diagnosis not present

## 2013-04-22 ENCOUNTER — Ambulatory Visit (HOSPITAL_COMMUNITY)
Admission: RE | Admit: 2013-04-22 | Discharge: 2013-04-22 | Disposition: A | Discharge status: Home / Self Care | Payer: Medicare Other | Source: Ambulatory Visit | Attending: Cardiovascular Disease | Admitting: Cardiovascular Disease

## 2013-04-22 DIAGNOSIS — I70219 Atherosclerosis of native arteries of extremities with intermittent claudication, unspecified extremity: Secondary | ICD-10-CM

## 2013-04-22 DIAGNOSIS — I739 Peripheral vascular disease, unspecified: Secondary | ICD-10-CM | POA: Insufficient documentation

## 2013-04-22 NOTE — Progress Notes (Signed)
Arterial Duplex Lower Ext. Completed. Zeb Rawl, BS, RDMS, RVT  

## 2013-04-27 ENCOUNTER — Telehealth: Payer: Self-pay | Admitting: *Deleted

## 2013-04-27 ENCOUNTER — Encounter: Payer: Self-pay | Admitting: *Deleted

## 2013-04-27 ENCOUNTER — Other Ambulatory Visit: Payer: Self-pay | Admitting: *Deleted

## 2013-04-27 DIAGNOSIS — I739 Peripheral vascular disease, unspecified: Secondary | ICD-10-CM

## 2013-04-27 MED ORDER — CLONIDINE HCL 0.1 MG PO TABS: 0.1000 mg | ORAL_TABLET | Freq: Two times a day (BID) | ORAL | Status: DC

## 2013-04-27 NOTE — Telephone Encounter (Signed)
Message copied by Marella Bile on Tue Apr 27, 2013  9:24 AM ------      Message from: Runell Gess      Created: Fri Apr 23, 2013  9:54 PM       No change from prior study. Repeat in 12 months. ------

## 2013-04-27 NOTE — Telephone Encounter (Signed)
Rx was sent to pharmacy electronically. 

## 2013-04-28 ENCOUNTER — Other Ambulatory Visit: Payer: Self-pay

## 2013-05-06 ENCOUNTER — Ambulatory Visit: Payer: Medicare Other | Admitting: Pharmacist Clinician (PhC)/ Clinical Pharmacy Specialist

## 2013-05-07 DIAGNOSIS — R51 Headache: Secondary | ICD-10-CM | POA: Diagnosis not present

## 2013-05-08 ENCOUNTER — Other Ambulatory Visit: Payer: Self-pay | Admitting: Cardiovascular Disease

## 2013-05-14 ENCOUNTER — Ambulatory Visit (INDEPENDENT_AMBULATORY_CARE_PROVIDER_SITE_OTHER): Payer: Medicare Other | Admitting: Pharmacist Clinician (PhC)/ Clinical Pharmacy Specialist

## 2013-05-14 VITALS — BP 140/60 | HR 68

## 2013-05-14 DIAGNOSIS — Z7901 Long term (current) use of anticoagulants: Secondary | ICD-10-CM

## 2013-05-14 DIAGNOSIS — I480 Paroxysmal atrial fibrillation: Secondary | ICD-10-CM

## 2013-05-14 DIAGNOSIS — I4891 Unspecified atrial fibrillation: Secondary | ICD-10-CM

## 2013-05-14 LAB — POCT INR: INR: 2.6

## 2013-05-15 DIAGNOSIS — Z203 Contact with and (suspected) exposure to rabies: Secondary | ICD-10-CM | POA: Diagnosis not present

## 2013-05-18 ENCOUNTER — Telehealth: Payer: Self-pay | Admitting: Pharmacist Clinician (PhC)/ Clinical Pharmacy Specialist

## 2013-05-18 DIAGNOSIS — Z203 Contact with and (suspected) exposure to rabies: Secondary | ICD-10-CM | POA: Diagnosis not present

## 2013-05-18 DIAGNOSIS — Z23 Encounter for immunization: Secondary | ICD-10-CM | POA: Diagnosis not present

## 2013-05-18 NOTE — Telephone Encounter (Signed)
Message copied by Rosalee Kaufman on Tue May 18, 2013 10:30 AM ------      Message from: Runell Gess      Created: Mon May 17, 2013  1:34 PM      Regarding: Headache       Yes, we can D/C nitrate and see if HA goes away. If CP comes back be will need to go back on it ------

## 2013-05-18 NOTE — Telephone Encounter (Signed)
Spoke with wife - pt at cardiac rehab.  Explained ok to d/c isosorbide for now to see if HA resolves.  However if CP develops we will need to restart the isosorbide-  Wife voiced understanding.

## 2013-05-21 ENCOUNTER — Other Ambulatory Visit: Payer: Self-pay | Admitting: Cardiovascular Disease

## 2013-05-21 NOTE — Telephone Encounter (Signed)
Rx was sent to pharmacy electronically. 

## 2013-05-22 DIAGNOSIS — Z203 Contact with and (suspected) exposure to rabies: Secondary | ICD-10-CM | POA: Diagnosis not present

## 2013-05-22 DIAGNOSIS — Z23 Encounter for immunization: Secondary | ICD-10-CM | POA: Diagnosis not present

## 2013-05-28 ENCOUNTER — Telehealth: Payer: Self-pay | Admitting: Internal Medicine

## 2013-05-28 NOTE — Telephone Encounter (Signed)
Received faxed refill request from [facility] for Sonata 10mg  #30   1 capsule by mouth at bedtime if needed for sleep  Last ov 12.20.13 w/ CY >> follow up in 1 year >> appt scheduled for 12.19.14 Last refill was given 2.5.14  Dr Maple Hudson please advise if pt may have refills.  Thank you.

## 2013-05-29 DIAGNOSIS — Z23 Encounter for immunization: Secondary | ICD-10-CM | POA: Diagnosis not present

## 2013-05-29 DIAGNOSIS — Z203 Contact with and (suspected) exposure to rabies: Secondary | ICD-10-CM | POA: Diagnosis not present

## 2013-05-30 ENCOUNTER — Emergency Department (HOSPITAL_COMMUNITY): Payer: Medicare Other

## 2013-05-30 ENCOUNTER — Encounter (HOSPITAL_COMMUNITY): Payer: Self-pay | Admitting: Emergency Medicine

## 2013-05-30 ENCOUNTER — Inpatient Hospital Stay (HOSPITAL_COMMUNITY)
Admission: EM | Admit: 2013-05-30 | Discharge: 2013-06-02 | DRG: 287 | Disposition: A | Discharge status: Home / Self Care | Payer: Medicare Other | Attending: Cardiology | Admitting: Cardiology

## 2013-05-30 DIAGNOSIS — I252 Old myocardial infarction: Secondary | ICD-10-CM | POA: Diagnosis not present

## 2013-05-30 DIAGNOSIS — I2510 Atherosclerotic heart disease of native coronary artery without angina pectoris: Secondary | ICD-10-CM | POA: Diagnosis not present

## 2013-05-30 DIAGNOSIS — Z79899 Other long term (current) drug therapy: Secondary | ICD-10-CM

## 2013-05-30 DIAGNOSIS — I6529 Occlusion and stenosis of unspecified carotid artery: Secondary | ICD-10-CM | POA: Diagnosis present

## 2013-05-30 DIAGNOSIS — R079 Chest pain, unspecified: Secondary | ICD-10-CM | POA: Diagnosis not present

## 2013-05-30 DIAGNOSIS — R519 Headache, unspecified: Secondary | ICD-10-CM | POA: Diagnosis present

## 2013-05-30 DIAGNOSIS — I658 Occlusion and stenosis of other precerebral arteries: Secondary | ICD-10-CM | POA: Diagnosis present

## 2013-05-30 DIAGNOSIS — I4891 Unspecified atrial fibrillation: Secondary | ICD-10-CM | POA: Diagnosis present

## 2013-05-30 DIAGNOSIS — I739 Peripheral vascular disease, unspecified: Secondary | ICD-10-CM | POA: Diagnosis present

## 2013-05-30 DIAGNOSIS — J4489 Other specified chronic obstructive pulmonary disease: Secondary | ICD-10-CM | POA: Diagnosis present

## 2013-05-30 DIAGNOSIS — Z9861 Coronary angioplasty status: Secondary | ICD-10-CM | POA: Diagnosis not present

## 2013-05-30 DIAGNOSIS — Z7901 Long term (current) use of anticoagulants: Secondary | ICD-10-CM

## 2013-05-30 DIAGNOSIS — R51 Headache: Secondary | ICD-10-CM | POA: Diagnosis not present

## 2013-05-30 DIAGNOSIS — I10 Essential (primary) hypertension: Secondary | ICD-10-CM | POA: Diagnosis present

## 2013-05-30 DIAGNOSIS — E785 Hyperlipidemia, unspecified: Secondary | ICD-10-CM | POA: Diagnosis not present

## 2013-05-30 DIAGNOSIS — I200 Unstable angina: Secondary | ICD-10-CM | POA: Diagnosis present

## 2013-05-30 DIAGNOSIS — J449 Chronic obstructive pulmonary disease, unspecified: Secondary | ICD-10-CM | POA: Diagnosis present

## 2013-05-30 DIAGNOSIS — G4733 Obstructive sleep apnea (adult) (pediatric): Secondary | ICD-10-CM | POA: Diagnosis present

## 2013-05-30 DIAGNOSIS — I480 Paroxysmal atrial fibrillation: Secondary | ICD-10-CM | POA: Diagnosis present

## 2013-05-30 DIAGNOSIS — Z87891 Personal history of nicotine dependence: Secondary | ICD-10-CM

## 2013-05-30 DIAGNOSIS — Z7982 Long term (current) use of aspirin: Secondary | ICD-10-CM | POA: Diagnosis not present

## 2013-05-30 LAB — CBC
HCT: 38 % — ABNORMAL LOW (ref 39.0–52.0)
MCHC: 34.7 g/dL (ref 30.0–36.0)
Platelets: 199 10*3/uL (ref 150–400)
RDW: 13.8 % (ref 11.5–15.5)

## 2013-05-30 LAB — BASIC METABOLIC PANEL
Calcium: 8.6 mg/dL (ref 8.4–10.5)
GFR calc non Af Amer: 50 mL/min — ABNORMAL LOW (ref >90)
Glucose, Bld: 99 mg/dL (ref 70–99)
Sodium: 140 mEq/L (ref 135–145)

## 2013-05-30 LAB — PRO B NATRIURETIC PEPTIDE: Pro B Natriuretic peptide (BNP): 500.1 pg/mL — ABNORMAL HIGH (ref 0–450)

## 2013-05-30 LAB — PROTIME-INR: INR: 2.36 — ABNORMAL HIGH (ref 0.00–1.49)

## 2013-05-30 MED ORDER — PANTOPRAZOLE SODIUM 40 MG PO TBEC
40.0000 mg | DELAYED_RELEASE_TABLET | Freq: Every day | ORAL | Status: DC
  Administered 2013-05-31 – 2013-06-02 (×2): 40 mg via ORAL
  Filled 2013-05-30 (×2): qty 1

## 2013-05-30 MED ORDER — METOPROLOL SUCCINATE ER 50 MG PO TB24
50.0000 mg | ORAL_TABLET | Freq: Two times a day (BID) | ORAL | Status: DC
  Administered 2013-05-30 – 2013-06-02 (×5): 50 mg via ORAL
  Filled 2013-05-30 (×7): qty 1

## 2013-05-30 MED ORDER — SODIUM CHLORIDE 0.9 % IV SOLN: 250.0000 mL | INTRAVENOUS | Status: DC | PRN

## 2013-05-30 MED ORDER — ENALAPRIL MALEATE 20 MG PO TABS
20.0000 mg | ORAL_TABLET | Freq: Every evening | ORAL | Status: DC
  Administered 2013-05-30 – 2013-06-01 (×3): 20 mg via ORAL
  Filled 2013-05-30 (×4): qty 1

## 2013-05-30 MED ORDER — AMITRIPTYLINE HCL 10 MG PO TABS
10.0000 mg | ORAL_TABLET | Freq: Every evening | ORAL | Status: DC
  Administered 2013-05-30 – 2013-06-01 (×4): 10 mg via ORAL
  Filled 2013-05-30 (×4): qty 1

## 2013-05-30 MED ORDER — ASPIRIN 81 MG PO TABS: 81.0000 mg | ORAL_TABLET | Freq: Every day | ORAL | Status: DC

## 2013-05-30 MED ORDER — FESOTERODINE FUMARATE ER 8 MG PO TB24
8.0000 mg | ORAL_TABLET | Freq: Every day | ORAL | Status: DC
  Administered 2013-05-30 – 2013-06-02 (×3): 8 mg via ORAL
  Filled 2013-05-30 (×5): qty 1

## 2013-05-30 MED ORDER — ISOSORBIDE MONONITRATE ER 30 MG PO TB24: 30.0000 mg | ORAL_TABLET | Freq: Two times a day (BID) | ORAL | Status: DC

## 2013-05-30 MED ORDER — ACETAMINOPHEN 325 MG PO TABS
650.0000 mg | ORAL_TABLET | ORAL | Status: DC | PRN
  Administered 2013-05-31: 325 mg via ORAL
  Administered 2013-06-01: 650 mg via ORAL
  Filled 2013-05-30 (×2): qty 2

## 2013-05-30 MED ORDER — SODIUM CHLORIDE 0.9 % IJ SOLN: 3.0000 mL | INTRAMUSCULAR | Status: DC | PRN

## 2013-05-30 MED ORDER — ISOSORBIDE MONONITRATE ER 30 MG PO TB24
30.0000 mg | ORAL_TABLET | Freq: Every evening | ORAL | Status: DC
  Administered 2013-05-30 – 2013-06-01 (×3): 30 mg via ORAL
  Filled 2013-05-30 (×4): qty 1

## 2013-05-30 MED ORDER — ATORVASTATIN CALCIUM 40 MG PO TABS
40.0000 mg | ORAL_TABLET | Freq: Every evening | ORAL | Status: DC
  Administered 2013-05-30 – 2013-06-01 (×3): 40 mg via ORAL
  Filled 2013-05-30 (×4): qty 1

## 2013-05-30 MED ORDER — POTASSIUM CHLORIDE CRYS ER 10 MEQ PO TBCR
10.0000 meq | EXTENDED_RELEASE_TABLET | ORAL | Status: DC
  Administered 2013-05-31 – 2013-06-02 (×2): 10 meq via ORAL
  Filled 2013-05-30 (×3): qty 1

## 2013-05-30 MED ORDER — ISOSORBIDE MONONITRATE ER 60 MG PO TB24
60.0000 mg | ORAL_TABLET | Freq: Every morning | ORAL | Status: DC
  Administered 2013-05-31 – 2013-06-02 (×3): 60 mg via ORAL
  Filled 2013-05-30 (×3): qty 1

## 2013-05-30 MED ORDER — ZOLPIDEM TARTRATE 5 MG PO TABS: 5.0000 mg | ORAL_TABLET | Freq: Every evening | ORAL | Status: DC | PRN

## 2013-05-30 MED ORDER — VITAMIN B-12 1000 MCG PO TABS
1000.0000 ug | ORAL_TABLET | Freq: Every day | ORAL | Status: DC
  Administered 2013-05-31 – 2013-06-02 (×3): 1000 ug via ORAL
  Filled 2013-05-30 (×3): qty 1

## 2013-05-30 MED ORDER — CLORAZEPATE DIPOTASSIUM 3.75 MG PO TABS: 7.5000 mg | ORAL_TABLET | Freq: Two times a day (BID) | ORAL | Status: DC | PRN

## 2013-05-30 MED ORDER — ONDANSETRON HCL 4 MG/2ML IJ SOLN
4.0000 mg | Freq: Four times a day (QID) | INTRAMUSCULAR | Status: DC | PRN
  Administered 2013-06-01: 4 mg via INTRAVENOUS
  Filled 2013-05-30: qty 2

## 2013-05-30 MED ORDER — SODIUM CHLORIDE 0.9 % IJ SOLN
3.0000 mL | Freq: Two times a day (BID) | INTRAMUSCULAR | Status: DC
  Administered 2013-05-31 – 2013-06-02 (×3): 3 mL via INTRAVENOUS

## 2013-05-30 MED ORDER — NITROGLYCERIN 0.4 MG SL SUBL: 0.4000 mg | SUBLINGUAL_TABLET | SUBLINGUAL | Status: DC | PRN

## 2013-05-30 MED ORDER — SERTRALINE HCL 50 MG PO TABS
50.0000 mg | ORAL_TABLET | Freq: Every morning | ORAL | Status: DC
  Administered 2013-05-30 – 2013-06-02 (×4): 50 mg via ORAL
  Filled 2013-05-30 (×4): qty 1

## 2013-05-30 MED ORDER — NITROGLYCERIN 2 % TD OINT: 1.0000 [in_us] | TOPICAL_OINTMENT | Freq: Once | TRANSDERMAL | Status: DC

## 2013-05-30 MED ORDER — FUROSEMIDE 40 MG PO TABS
40.0000 mg | ORAL_TABLET | Freq: Every day | ORAL | Status: DC
  Administered 2013-05-30 – 2013-06-02 (×4): 40 mg via ORAL
  Filled 2013-05-30 (×4): qty 1

## 2013-05-30 MED ORDER — ASPIRIN EC 81 MG PO TBEC
81.0000 mg | DELAYED_RELEASE_TABLET | Freq: Every day | ORAL | Status: DC
  Administered 2013-05-31 – 2013-06-02 (×2): 81 mg via ORAL
  Filled 2013-05-30 (×3): qty 1

## 2013-05-30 MED ORDER — ADULT MULTIVITAMIN W/MINERALS CH
1.0000 | ORAL_TABLET | Freq: Every day | ORAL | Status: DC
  Administered 2013-05-31 – 2013-06-02 (×3): 1 via ORAL
  Filled 2013-05-30 (×3): qty 1

## 2013-05-30 NOTE — Progress Notes (Signed)
ANTICOAGULATION CONSULT NOTE - Initial Consult  Pharmacy Consult for Heparin Indication: chest pain/ACS  Allergies  Allergen Reactions  . Ciprofloxacin     unknown  . Codeine     "knocked me out"  . Dexamethasone     unknown  . Sulfonamide Derivatives     Unknown reaction  . Telmisartan     unknown    Patient Measurements: Height: 5\' 7"  (170.2 cm) Weight: 203 lb (92.08 kg) IBW/kg (Calculated) : 66.1 Heparin Dosing Weight: 85.5 kg  Vital Signs: Temp: 97.9 F (36.6 C) (09/07 1259) Temp src: Oral (09/07 1259) BP: 145/61 mmHg (09/07 1259) Pulse Rate: 63 (09/07 1200)  Labs:  Recent Labs  05/30/13 0816  HGB 13.2  HCT 38.0*  PLT 199  LABPROT 25.0*  INR 2.36*  CREATININE 1.28    Estimated Creatinine Clearance: 48.1 ml/min (by C-G formula based on Cr of 1.28).   Medical History: Past Medical History  Diagnosis Date  . OSA (obstructive sleep apnea)     NPSG 05-05-99 RDI 65/hr-CPAP  . Chronic airway obstruction, not elsewhere classified   . Lung nodules     CT 07-01-10 stable @ [facility]  . CAD (coronary artery disease)     Hx of MI 2007,   . Reflux esophagitis   . GERD (gastroesophageal reflux disease)   . Hypertension   . Hyperlipidemia   . PVD (peripheral vascular disease) 2004    stenting of both SFA's 2004  . Carotid disease, bilateral     left greater than right  . STEMI (ST elevation myocardial infarction) 2007    stent to LAD    Medications:  Scheduled:  . amitriptyline  10 mg Oral QPM  . [START ON 05/31/2013] aspirin EC  81 mg Oral Daily  . atorvastatin  40 mg Oral QPM  . enalapril  20 mg Oral QPM  . fesoterodine  8 mg Oral Daily  . furosemide  40 mg Oral Daily  . metoprolol succinate  50 mg Oral BID  . [START ON 05/31/2013] multivitamin with minerals  1 tablet Oral Daily  . nitroGLYCERIN  1 inch Topical Once  . [START ON 05/31/2013] pantoprazole  40 mg Oral Daily  . [START ON 05/31/2013] potassium chloride  10 mEq Oral QODAY  . sertraline  50 mg  Oral q morning - 10a  . sodium chloride  3 mL Intravenous Q12H  . [START ON 05/31/2013] vitamin B-12  1,000 mcg Oral Daily   Assessment: 79 yo M presenting to ER with chest pain that awakened him from sleep.  Patient has a hx of PAF for which he takes Coumadin PTA. Pharmacy consulted to start heparin gtt once INR drops below 2.  Current INR this AM was 2.36.  Will not start heparin drip at this time.  Goal of Therapy:  Heparin level 0.3-0.7 units/ml (once INR <2) Monitor platelets by anticoagulation protocol: Yes   Plan:  - f/u INR in AM and start heparin gtt if <2  Harrold Donath E. Achilles Dunk, PharmD Clinical Pharmacist - Resident Pager: [phone] Pharmacy: [phone] 05/30/2013 2:21 PM

## 2013-05-30 NOTE — ED Notes (Signed)
To ED via [facility] -- had episode of chest pain at 5:30am woke up from sleep-- took 3 NTG at home, without relief-- EMS gave NTG 1 SL and baby ASA x 3 (324mg ) enroute. Pain free at present.

## 2013-05-30 NOTE — ED Provider Notes (Signed)
CSN: [id number]     Arrival date & time 05/30/13  4098 History   First MD Initiated Contact with Patient 05/30/13 [phone]     Chief Complaint  Patient presents with  . Chest Pain   (Consider location/radiation/quality/duration/timing/severity/associated sxs/prior Treatment) HPI Comments: Patient brought to the emergency department for chest pain by ambulance. Patient reports that he awoke with chest pain at 5:30 AM. He took 3 of his sublingual nitroglycerin without any improvement. EMS gave him baby aspirin and one sublingual nitroglycerin and his pain resolved. Arrival to the ER he is now pain-free. Patient reports that he had no shortness of breath and there is no diaphoresis or nausea. Patient does have a significant cardiac history including 5 stents. He thinks his last tetanus 3 years ago, has had a stress test in the last 1-2 years. His cardiologist is Doctor Allyson Sabal.  Patient is a 79 y.o. male presenting with chest pain.  Chest Pain Associated symptoms: no shortness of breath     Past Medical History  Diagnosis Date  . OSA (obstructive sleep apnea)     NPSG 05-05-99 RDI 65/hr-CPAP  . Chronic airway obstruction, not elsewhere classified   . Lung nodules     CT 07-01-10 stable @ [facility]  . CAD (coronary artery disease)   . Reflux esophagitis   . GERD (gastroesophageal reflux disease)   . Hypertension   . Hyperlipidemia   . PVD (peripheral vascular disease) 2004    stenting of both SFA's 2004  . Carotid disease, bilateral     left greater than right  . STEMI (ST elevation myocardial infarction) 09/20/2008   Past Surgical History  Procedure Laterality Date  . Right rotator cuff    . Appendectomy    . Tonsillectomy    . Coronary angioplasty with stent placement  06/13/2004    successful cutting balloon atherectomy/PIi & STENTING mid LAD & POBA of the distal LAD  . Coronary angioplasty with stent placement  06/03/2006    successful PTCA & stent mid LAD  . Coronary angioplasty with  stent placement  06/06/2006    successful double stenting of the ostial and prox RCA, successful PTCA LAD of the ostium major diag branch due to jailing by a previously placed LAD stent,successful mid kiagonal branch primary stenting  . Coronary angioplasty with stent placement  09/20/2008    PTCA LAD stent  . Cardiac catheterization  10/03/2008    Occluded LAD  . US echocardiography  12/24/2011    trace MR,TR,AI,PI. EF >55%  . Nm myoview ltd  08/01/2011    Abnormal-intermed to high risk based upon per-infarct ischemia  . Carotid doppler  05/29/2012    bilateral ICA stenosis   Family History  Problem Relation Age of Onset  . Heart attack Father   . Heart failure Mother   . Stroke Maternal Grandmother    History  Substance Use Topics  . Smoking status: Former Smoker    Types: Cigarettes    Quit date: 09/23/1973  . Smokeless tobacco: Never Used     Comment: tried snuff and chew as a child (75yr) did not continue  . Alcohol Use: No     Comment: seldom-- every three or four years    Review of Systems  Respiratory: Negative for shortness of breath.   Cardiovascular: Positive for chest pain.  All other systems reviewed and are negative.    Allergies  Ciprofloxacin; Codeine; Sulfonamide derivatives; and Telmisartan  Home Medications   Current Outpatient Rx  Name  Route  Sig  Dispense  Refill  . aspirin 81 MG tablet   Oral   Take 81 mg by mouth daily.         Marland Kitchen atorvastatin (LIPITOR) 40 MG tablet      take 1 tablet by mouth at bedtime   31 tablet   6   . cholecalciferol (VITAMIN D) 400 UNITS TABS   Oral   Take 400 Units by mouth.         . cloNIDine (CATAPRES) 0.1 MG tablet   Oral   Take 1 tablet (0.1 mg total) by mouth 2 (two) times daily.   60 tablet   11   . clorazepate (TRANXENE) 7.5 MG tablet   Oral   Take 7.5 mg by mouth 2 (two) times daily as needed.         . enalapril (VASOTEC) 20 MG tablet   Oral   Take 1.5 tablets (30 mg total) by mouth daily.  Take 1 1/2 tablet by mouth daily   45 tablet   11   . Fesoterodine Fumarate (TOVIAZ) 8 MG TB24   Oral   Take 8 mg by mouth daily.         . furosemide (LASIX) 40 MG tablet   Oral   Take 1 tablet (40 mg total) by mouth daily.   30 tablet   5   . isosorbide mononitrate (IMDUR) 30 MG 24 hr tablet   Oral   Take 30 mg by mouth. Take 2 tablets by mouth every morning and 1 tablet by mouth every evening         . KLOR-CON M10 10 MEQ tablet      10 mEq every other day.          . loratadine (CLARITIN) 10 MG tablet   Oral   Take 10 mg by mouth daily.           . metoprolol (LOPRESSOR) 50 MG tablet      take 1 tablet by mouth twice a day   60 tablet   11   . Misc Natural Products (LUTEIN VISION BLEND PO)   Oral   Take 1 tablet by mouth 2 (two) times daily.         . Multiple Vitamin (MULTIVITAMIN) tablet   Oral   Take 1 tablet by mouth daily.         Marland Kitchen NASONEX 50 MCG/ACT nasal spray               . nitroGLYCERIN (NITROSTAT) 0.4 MG SL tablet   Sublingual   Place 0.4 mg under the tongue every 5 (five) minutes as needed.         . pantoprazole (PROTONIX) 40 MG tablet   Oral   Take 1 tablet by mouth daily.         . sertraline (ZOLOFT) 25 MG tablet   Oral   Take 25 tablets by mouth daily.         . vitamin B-12 (CYANOCOBALAMIN) 1000 MCG tablet   Oral   Take 1,000 mcg by mouth daily.         Marland Kitchen warfarin (COUMADIN) 5 MG tablet      take 1 tablet by mouth once daily or as directed   30 tablet   6   . zaleplon (SONATA) 10 MG capsule      take 1 capsule by mouth at bedtime if needed for sleep   30 capsule   5  BP 147/63  Pulse 63  Temp(Src) 98.1 F (36.7 C) (Oral)  Resp 16  Ht 5\' 7"  (1.702 m)  Wt 206 lb (93.441 kg)  BMI 32.26 kg/m2  SpO2 97% Physical Exam  Constitutional: He is oriented to person, place, and time. He appears well-developed and well-nourished. No distress.  HENT:  Head: Normocephalic and atraumatic.  Right Ear:  Hearing normal.  Left Ear: Hearing normal.  Nose: Nose normal.  Mouth/Throat: Oropharynx is clear and moist and mucous membranes are normal.  Eyes: Conjunctivae and EOM are normal. Pupils are equal, round, and reactive to light.  Neck: Normal range of motion. Neck supple.  Cardiovascular: Regular rhythm, S1 normal and S2 normal.  Exam reveals no gallop and no friction rub.   No murmur heard. Pulmonary/Chest: Effort normal and breath sounds normal. No respiratory distress. He exhibits no tenderness.  Abdominal: Soft. Normal appearance and bowel sounds are normal. There is no hepatosplenomegaly. There is no tenderness. There is no rebound, no guarding, no tenderness at McBurney's point and negative Murphy's sign. No hernia.  Musculoskeletal: Normal range of motion.  Neurological: He is alert and oriented to person, place, and time. He has normal strength. No cranial nerve deficit or sensory deficit. Coordination normal. GCS eye subscore is 4. GCS verbal subscore is 5. GCS motor subscore is 6.  Skin: Skin is warm, dry and intact. No rash noted. No cyanosis.  Psychiatric: He has a normal mood and affect. His speech is normal and behavior is normal. Thought content normal.    ED Course  Procedures (including critical care time)   Date: 05/30/2013  Rate: 63  Rhythm: normal sinus rhythm  QRS Axis: normal  Intervals: normal  ST/T Wave abnormalities: normal  Conduction Disutrbances:none  Narrative Interpretation:   Old EKG Reviewed: unchanged    Labs Review Labs Reviewed  CBC - Abnormal; Notable for the following:    RBC 3.98 (*)    HCT 38.0 (*)    All other components within normal limits  PRO B NATRIURETIC PEPTIDE - Abnormal; Notable for the following:    Pro B Natriuretic peptide (BNP) 500.1 (*)    All other components within normal limits  BASIC METABOLIC PANEL - Abnormal; Notable for the following:    GFR calc non Af Amer 50 (*)    GFR calc Af Amer 58 (*)    All other  components within normal limits  PROTIME-INR - Abnormal; Notable for the following:    Prothrombin Time 25.0 (*)    INR 2.36 (*)    All other components within normal limits  POCT I-STAT TROPONIN I   Imaging Review Dg Chest Port 1 View  05/30/2013   *RADIOLOGY REPORT*  Clinical Data: Chest pain.  PORTABLE CHEST - 1 VIEW  Comparison: Chest x-ray 09/11/2012.  Findings: Lung volumes are low.  Mild elevation of the left hemidiaphragm is unchanged.  No acute consolidative airspace disease.  No pleural effusion.  No evidence of pulmonary edema. Heart size is normal.  Mediastinal contours are unremarkable. Atherosclerosis in the thoracic aorta.  Coronary artery stent incidentally noted.  IMPRESSION: 1.  Low lung volumes without definite radiographic evidence of acute cardiopulmonary disease. 2.  Atherosclerosis status post PTCI to a coronary artery (likely the LAD).   Original Report Authenticated By: Trudie Reed, M.D.    MDM  Diagnosis: Chest pain  Patient presents to the ER for evaluation of chest pain. Patient had chest pain at rest this morning which awakened him from sleep. It  took multiple nitroglycerin administrations, but he is now pain-free. Initial workup is negative. Patient's vital signs are stable. His records reveals his most recent catheterization in 2010 showed multiple residual blockages that were felt to be medical management. Based on this, I feel the patient should be admitted to the hospital for further monitoring and serial enzymes the case discussed briefly with Doctor Herbie Baltimore, place observation holding orders for telemetry.    Gilda Crease, MD 05/30/13 1016

## 2013-05-30 NOTE — H&P (Signed)
Gavin [name] is an 79 y.o. male.    Primary Cardiologist:Dr. Erlene Quan PCP: Lucila Maine, MD  Chief Complaint: chest pain HPI: 78 year-old white married male presents to ER today with chest pain, [place], that awakened him from sleep.  He has a history of coronary disease with last cardiac catheterization July 2010 revealing an occluded LAD stent otherwise normal coronary arteries with, at that time, reduced EF of 30% subsequent 2-D echo in 2013 revealed EF is normal. Last stress test 2012 with mod to severe perfusion defect, maybe slightly worse than previous study.  Pt was without symptoms so no further work up was done.   He also has a history of PAF on Coumadin for anticoagulation and maintaining sinus rhythm. He has peripheral vascular disease with stenting by Dr. Allyson Sabal to both SFAs in 2004. 0 vessel runoff on the right and one on the left via peroneal Dopplers are followed he also has bilateral carotid disease left greater than right which we followed by duplex ultrasound through our office.  Other history includes hypertension hyperlipidemia obstructive sleep apnea on CPAP.    He has chronic Headache with neg. CT head in June, and recently per our office stopped his IMDUR to see if Headache would resolve.  It did not.  He resumed the Imdur yesterday.   He has been doing well without chest pain or SOB.  This am he was awakened with mid sternal chest pain.  No associated sxs of SOB, nausea or diaphoresis.  He took 3 of his NTG without relief and then called EMS.  They gave 3 baby ASA and another NTG.  His symptoms improved.  EKG without acute changes. Troponin POC is negative.  Pro BNP 500.  INR today 2.3.   .  Please note recent exposure to a bat, he is undergoing "rabies shots"  He has one more to complete his cycle.  This is for next Saturday.    Past Medical History  Diagnosis Date  . OSA (obstructive sleep apnea)     NPSG 05-05-99 RDI 65/hr-CPAP  . Chronic airway obstruction, not  elsewhere classified   . Lung nodules     CT 07-01-10 stable @ [facility]  . CAD (coronary artery disease)     Hx of MI 2007,   . Reflux esophagitis   . GERD (gastroesophageal reflux disease)   . Hypertension   . Hyperlipidemia   . PVD (peripheral vascular disease) 2004    stenting of both SFA's 2004  . Carotid disease, bilateral     left greater than right  . STEMI (ST elevation myocardial infarction) 2007    stent to LAD    Past Surgical History  Procedure Laterality Date  . Right rotator cuff    . Appendectomy    . Tonsillectomy    . Coronary angioplasty with stent placement  06/13/2004    successful cutting balloon atherectomy/PIi & STENTING mid LAD & POBA of the distal LAD  . Coronary angioplasty with stent placement  06/03/2006    successful PTCA & stent mid LAD  . Coronary angioplasty with stent placement  06/06/2006    successful double stenting of the ostial and prox RCA, successful PTCA LAD of the ostium major diag branch due to jailing by a previously placed LAD stent,successful mid kiagonal branch primary stenting  . Coronary angioplasty with stent placement  09/20/2008    PTCA LAD stent  . Cardiac catheterization  10/03/2008    Occluded LAD  .  US echocardiography  12/24/2011    trace MR,TR,AI,PI. EF >55%  . Nm myoview ltd  08/01/2011    Abnormal-intermed to high risk based upon per-infarct ischemia  . Carotid doppler  05/29/2012    bilateral ICA stenosis    Family History  Problem Relation Age of Onset  . Heart attack Father   . Heart failure Mother   . Stroke Maternal Grandmother    Social History:  reports that he quit smoking about 39 years ago. His smoking use included Cigarettes. He smoked 0.00 packs per day. He has never used smokeless tobacco. He reports that he does not drink alcohol or use illicit drugs.  Allergies:  Allergies  Allergen Reactions  . Ciprofloxacin     unknown  . Codeine     "knocked me out"  . Dexamethasone     unknown  . Sulfonamide  Derivatives     Unknown reaction  . Telmisartan     unknown    Outpatient Medications. Furosemide 40 mg daily  Lipitor 40 mg at bedtime Imdur 60 mg in the morning 30 mg at night Amitriptyline 10 mg one daily Clonidine 0.1 mg half pill daily at bedtime Coumadin 5 mg daily except Mondays and Fridays only 2.5 mg Klor-Con 10 mEq one every 3 days Nasonex nasal spray 50 mics mornings and evenings Protonic 40 mg every evening Sonata 10 mg every evening Toprol-XL 50 mg twice a day Vasotec 20 mg every evening Toviaz 8 mg every evening Zoloft 50 mg every morning Aspirin 81 mg every day Zyrtec 10 mg every morning PreserVision twice a day Vitamin B12 thousand units daily Multivitamin daily Saline nasal spray CPAP at night  Results for orders placed during the hospital encounter of 05/30/13 (from the past 48 hour(s))  CBC     Status: Abnormal   Collection Time    05/30/13  8:16 AM      Result Value Range   WBC 8.2  4.0 - 10.5 K/uL   RBC 3.98 (*) 4.22 - 5.81 MIL/uL   Hemoglobin 13.2  13.0 - 17.0 g/dL   HCT 16.1 (*) 09.6 - 04.5 %   MCV 95.5  78.0 - 100.0 fL   MCH 33.2  26.0 - 34.0 pg   MCHC 34.7  30.0 - 36.0 g/dL   RDW 40.9  81.1 - 91.4 %   Platelets 199  150 - 400 K/uL  PRO B NATRIURETIC PEPTIDE     Status: Abnormal   Collection Time    05/30/13  8:16 AM      Result Value Range   Pro B Natriuretic peptide (BNP) 500.1 (*) 0 - 450 pg/mL  BASIC METABOLIC PANEL     Status: Abnormal   Collection Time    05/30/13  8:16 AM      Result Value Range   Sodium 140  135 - 145 mEq/L   Potassium 3.7  3.5 - 5.1 mEq/L   Chloride 104  96 - 112 mEq/L   CO2 30  19 - 32 mEq/L   Glucose, Bld 99  70 - 99 mg/dL   BUN 20  6 - 23 mg/dL   Creatinine, Ser 7.82  0.50 - 1.35 mg/dL   Calcium 8.6  8.4 - 95.6 mg/dL   GFR calc non Af Amer 50 (*) >90 mL/min   GFR calc Af Amer 58 (*) >90 mL/min   Comment: (NOTE)     The eGFR has been calculated using the CKD EPI equation.  This calculation has not  been validated in all clinical situations.     eGFR's persistently <90 mL/min signify possible Chronic Kidney     Disease.  PROTIME-INR     Status: Abnormal   Collection Time    05/30/13  8:16 AM      Result Value Range   Prothrombin Time 25.0 (*) 11.6 - 15.2 seconds   INR 2.36 (*) 0.00 - 1.49  POCT I-STAT TROPONIN I     Status: None   Collection Time    05/30/13  8:23 AM      Result Value Range   Troponin i, poc 0.01  0.00 - 0.08 ng/mL   Comment 3            Comment: Due to the release kinetics of cTnI,     a negative result within the first hours     of the onset of symptoms does not rule out     myocardial infarction with certainty.     If myocardial infarction is still suspected,     repeat the test at appropriate intervals.   Dg Chest Port 1 View  05/30/2013   *RADIOLOGY REPORT*  Clinical Data: Chest pain.  PORTABLE CHEST - 1 VIEW  Comparison: Chest x-ray 09/11/2012.  Findings: Lung volumes are low.  Mild elevation of the left hemidiaphragm is unchanged.  No acute consolidative airspace disease.  No pleural effusion.  No evidence of pulmonary edema. Heart size is normal.  Mediastinal contours are unremarkable. Atherosclerosis in the thoracic aorta.  Coronary artery stent incidentally noted.  IMPRESSION: 1.  Low lung volumes without definite radiographic evidence of acute cardiopulmonary disease. 2.  Atherosclerosis status post PTCI to a coronary artery (likely the LAD).   Original Report Authenticated By: Trudie Reed, M.D.    ROS: General:no colds or fevers, no recent weight changes Skin:no rashes or ulcers HEENT:no blurred vision, no congestion, chronic headache CV:see HPI PUL:see HPI GI:no diarrhea constipation or melena, no indigestion GU:no hematuria, no dysuria MS:no joint pain, no claudication Neuro:no syncope, no lightheadedness Endo:no diabetes, no thyroid disease   Blood pressure 145/61, pulse 63, temperature 97.9 F (36.6 C), temperature source Oral, resp. rate  18, height 5\' 7"  (1.702 m), weight 203 lb (92.08 kg), SpO2 100.00%. PE: General:Pleasant affect, NAD Skin:Warm and dry, brisk capillary refill HEENT:normocephalic, sclera clear, mucus membranes moist, + facial symmetry, glasses in place, pupils equal, EOMs intact Neck:supple, no JVD, no bruits, no thyromegaly   Heart:S1S2 RRR without murmur, gallup, rub or click Lungs:clear without rales, rhonchi, or wheezes WUJ:WJXB, non tender, + BS, do not palpate liver spleen or masses Ext:no lower ext edema, 2+ pedal pulses, 2+ radial pulses Neuro:alert and oriented, MAE, follows commands, + facial symmetry, tongue midline, symmetry with smile and frown.    Assessment/Plan Principal Problem:   Unstable angina Active Problems:   OBSTRUCTIVE SLEEP APNEA   CAD, with hx of MI 2007 with BMS STENT to LAD PCI in 2009 for angina as well, Last cath 2010 with occluded LAD stent, last nuc study 2012   PAF (paroxysmal atrial fibrillation)   PAD (peripheral artery disease), history of stenting to both SFAs   Hyperlipidemia LDL goal < 70   Chronic anticoagulation, on coumadin   Headache  PLAN:  Admit and rule out MI, once INR less than 2 would begin Heparin.  Serial enzymes.  Also will hold clonidine to see if this will improve H/A. ? nuc study vs. Cath, though with last nuc revealing ischemia  cardiac cath may be prudent.   [facility] R Nurse Practitioner Certified [facility] Pager [phone] 05/30/2013, 1:11 PM  I have seen and evaluated the patient this PM along with Nada Boozer, NP. I agree with her findings, examination as well as impression recommendations.  79 y/o man well known to our service with known CAD - LAD occlusion. Most recent ST last yr (in absence of Sx suggested possibility of Anterior-Ant-lat ischemia on infarct.  Extensive CAD-PCI history reviewed.    He presented to the ER after a prolonged episode of SS (Central Chest) CP - described as a pressure with some SOB.   Minimal relief with NTG SL @ home.  EMS gave 4 baby ASA & 4th NTG - Sx resolved by arrival to ER.  Remains CP free now.  ECG on arrival without acute abnormalities.  Initial Troponin is negative.  Unfortunately, with an abnormal ST last year, I am reluctant to simply order another ST in the setting of Sx concerning for Unstable Angina.  I think LCP-angio would be the most prudent diagnostic evaluation method.  He is on warfarin for Afib AC - will need to be held. - initiate IV Heparin when INR < 2.0.  Has had HA, but probably not related to Imdur (still present off of it) -- Clonidine held.  - restart IMDUR. Continue BB, ACE-I  On statin.   PPI.  Plan - will hold warfarin for possible CATH once INR < 1.6.  Dr. Allyson Sabal to review case (including films & prior nuc) - may decide to repeat Myoview, but will defer to him.   MD Time with pt: 35 min  HARDING,DAVID W, M.D., M.S. [facility] [address] [place], [place]  [address]  [phone] Pager # [phone] 05/30/2013 4:43 PM

## 2013-05-30 NOTE — ED Notes (Addendum)
Pt has a has a hx of sleep apnea-- dozing at present-- resp drop below 10/min-- wife at bedside. Wears CPAP at night.

## 2013-05-30 NOTE — Telephone Encounter (Signed)
Ok to refill until next appointment date

## 2013-05-30 NOTE — Progress Notes (Signed)
RT placed patient on cpap 9cmH20 via nasal mask. Patient is tolerating cpap well at this time.

## 2013-05-31 DIAGNOSIS — I2510 Atherosclerotic heart disease of native coronary artery without angina pectoris: Secondary | ICD-10-CM | POA: Diagnosis not present

## 2013-05-31 DIAGNOSIS — I200 Unstable angina: Secondary | ICD-10-CM | POA: Diagnosis not present

## 2013-05-31 DIAGNOSIS — I6529 Occlusion and stenosis of unspecified carotid artery: Secondary | ICD-10-CM | POA: Diagnosis not present

## 2013-05-31 DIAGNOSIS — R079 Chest pain, unspecified: Secondary | ICD-10-CM | POA: Diagnosis not present

## 2013-05-31 DIAGNOSIS — I4891 Unspecified atrial fibrillation: Secondary | ICD-10-CM | POA: Diagnosis not present

## 2013-05-31 LAB — CBC
HCT: 40.9 % (ref 39.0–52.0)
Hemoglobin: 13.7 g/dL (ref 13.0–17.0)
MCV: 95.6 fL (ref 78.0–100.0)
Platelets: 209 10*3/uL (ref 150–400)
RBC: 4.28 MIL/uL (ref 4.22–5.81)
WBC: 9.9 10*3/uL (ref 4.0–10.5)

## 2013-05-31 LAB — BASIC METABOLIC PANEL
CO2: 28 mEq/L (ref 19–32)
Chloride: 105 mEq/L (ref 96–112)
Creatinine, Ser: 1.15 mg/dL (ref 0.50–1.35)
Glucose, Bld: 104 mg/dL — ABNORMAL HIGH (ref 70–99)

## 2013-05-31 LAB — LIPID PANEL
HDL: 37 mg/dL — ABNORMAL LOW (ref >39)
LDL Cholesterol: 48 mg/dL (ref 0–99)
Triglycerides: 114 mg/dL (ref <150)
VLDL: 23 mg/dL (ref 0–40)

## 2013-05-31 LAB — HEPATIC FUNCTION PANEL
ALT: 18 U/L (ref 0–53)
AST: 19 U/L (ref 0–37)
Albumin: 3.4 g/dL — ABNORMAL LOW (ref 3.5–5.2)
Total Bilirubin: 0.6 mg/dL (ref 0.3–1.2)

## 2013-05-31 LAB — PROTIME-INR: Prothrombin Time: 24.7 seconds — ABNORMAL HIGH (ref 11.6–15.2)

## 2013-05-31 MED ORDER — POTASSIUM CHLORIDE CRYS ER 20 MEQ PO TBCR: 20.0000 meq | EXTENDED_RELEASE_TABLET | Freq: Once | ORAL | Status: DC

## 2013-05-31 MED ORDER — TRAMADOL HCL 50 MG PO TABS
50.0000 mg | ORAL_TABLET | Freq: Four times a day (QID) | ORAL | Status: DC | PRN
  Administered 2013-05-31 – 2013-06-01 (×2): 50 mg via ORAL
  Filled 2013-05-31 (×2): qty 1

## 2013-05-31 MED ORDER — SODIUM CHLORIDE 0.9 % IV SOLN
1.0000 mL/kg/h | INTRAVENOUS | Status: DC
  Administered 2013-05-31 – 2013-06-01 (×2): 1 mL/kg/h via INTRAVENOUS

## 2013-05-31 MED ORDER — DIAZEPAM 2 MG PO TABS
2.0000 mg | ORAL_TABLET | ORAL | Status: AC
  Administered 2013-06-01: 2 mg via ORAL
  Filled 2013-05-31: qty 1

## 2013-05-31 MED ORDER — SODIUM CHLORIDE 0.9 % IJ SOLN: 3.0000 mL | INTRAMUSCULAR | Status: DC | PRN

## 2013-05-31 MED ORDER — SODIUM CHLORIDE 0.9 % IJ SOLN
3.0000 mL | Freq: Two times a day (BID) | INTRAMUSCULAR | Status: DC
  Administered 2013-05-31: 3 mL via INTRAVENOUS

## 2013-05-31 MED ORDER — SODIUM CHLORIDE 0.9 % IV SOLN: 250.0000 mL | INTRAVENOUS | Status: DC | PRN

## 2013-05-31 MED ORDER — ASPIRIN 81 MG PO CHEW
324.0000 mg | CHEWABLE_TABLET | ORAL | Status: AC
  Administered 2013-06-01: 324 mg via ORAL
  Filled 2013-05-31: qty 4

## 2013-05-31 MED ORDER — AMLODIPINE BESYLATE 5 MG PO TABS
5.0000 mg | ORAL_TABLET | Freq: Every day | ORAL | Status: DC
  Administered 2013-05-31 – 2013-06-02 (×3): 5 mg via ORAL
  Filled 2013-05-31 (×3): qty 1

## 2013-05-31 MED ORDER — POTASSIUM CHLORIDE CRYS ER 20 MEQ PO TBCR
20.0000 meq | EXTENDED_RELEASE_TABLET | Freq: Once | ORAL | Status: AC
  Administered 2013-05-31: 20 meq via ORAL
  Filled 2013-05-31: qty 1

## 2013-05-31 NOTE — Progress Notes (Signed)
Subjective: No complaints except his chronic twinge on lt side of chest, but no angina. Had trouble sleeping with hospital CPap.   Objective: Vital signs in last 24 hours: Temp:  [97.9 F (36.6 C)-98.3 F (36.8 C)] 98.2 F (36.8 C) (09/08 0500) Pulse Rate:  [57-126] 62 (09/08 0500) Resp:  [14-23] 18 (09/08 0500) BP: (126-168)/(53-114) 150/57 mmHg (09/08 0500) SpO2:  [86 %-100 %] 99 % (09/08 0500) Weight:  [202 lb 13.2 oz (92 kg)-203 lb (92.08 kg)] 202 lb 13.2 oz (92 kg) (09/08 0814) Weight change:  Last BM Date: 05/30/13 Intake/Output from previous day:+200 09/07 0701 - 09/08 0700 In: 200 [P.O.:200] Out: -  Intake/Output this shift:    PE: General:Pleasant affect, NAD Skin:Warm and dry, brisk capillary refill HEENT:normocephalic, sclera clear, mucus membranes moist Heart:S1S2 RRR without murmur, gallup, rub or click Lungs:clear without rales, rhonchi, or wheezes ZOX:WRUE, non tender, + BS, do not palpate liver spleen or masses Ext:no lower ext edema, 2+ pedal pulses, 2+ radial pulses Neuro:alert and oriented, MAE, follows commands, + facial symmetry   Lab Results:  Recent Labs  05/30/13 0816 05/31/13 0153  WBC 8.2 9.9  HGB 13.2 13.7  HCT 38.0* 40.9  PLT 199 209   BMET  Recent Labs  05/30/13 0816 05/31/13 0153  NA 140 142  K 3.7 3.5  CL 104 105  CO2 30 28  GLUCOSE 99 104*  BUN 20 17  CREATININE 1.28 1.15  CALCIUM 8.6 8.4    Recent Labs  05/30/13 1930 05/31/13 0153  TROPONINI <0.30 <0.30    Lab Results  Component Value Date   CHOL 108 05/31/2013   HDL 37* 05/31/2013   LDLCALC 48 05/31/2013   TRIG 114 05/31/2013   CHOLHDL 2.9 05/31/2013   Lab Results  Component Value Date   HGBA1C  Value: 5.5 (NOTE)   The ADA recommends the following therapeutic goal for glycemic   control related to Hgb A1C measurement:   Goal of Therapy:   < 7.0% Hgb A1C   Reference: American Diabetes Association: Clinical Practice   Recommendations 2008, Diabetes Care,   2008, 31:(Suppl 1). 10/01/2008     Lab Results  Component Value Date   TSH 1.812 05/30/2013    Hepatic Function Panel  Recent Labs  05/31/13 0153  PROT 6.2  ALBUMIN 3.4*  AST 19  ALT 18  ALKPHOS 59  BILITOT 0.6  BILIDIR 0.2  IBILI 0.4    Recent Labs  05/31/13 0153  CHOL 108   No results found for this basename: PROTIME,  in the last 72 hours      Studies/Results: Dg Chest Port 1 View  05/30/2013   *RADIOLOGY REPORT*  Clinical Data: Chest pain.  PORTABLE CHEST - 1 VIEW  Comparison: Chest x-ray 09/11/2012.  Findings: Lung volumes are low.  Mild elevation of the left hemidiaphragm is unchanged.  No acute consolidative airspace disease.  No pleural effusion.  No evidence of pulmonary edema. Heart size is normal.  Mediastinal contours are unremarkable. Atherosclerosis in the thoracic aorta.  Coronary artery stent incidentally noted.  IMPRESSION: 1.  Low lung volumes without definite radiographic evidence of acute cardiopulmonary disease. 2.  Atherosclerosis status post PTCI to a coronary artery (likely the LAD).   Original Report Authenticated By: Trudie Reed, M.D.    Medications: I have reviewed the patient's current medications. Scheduled Meds: . amitriptyline  10 mg Oral QPM  . aspirin EC  81 mg Oral Daily  . atorvastatin  40 mg Oral QPM  . enalapril  20 mg Oral QPM  . fesoterodine  8 mg Oral Daily  . furosemide  40 mg Oral Daily  . isosorbide mononitrate  60 mg Oral q morning - 10a   And  . isosorbide mononitrate  30 mg Oral QPM  . metoprolol succinate  50 mg Oral BID  . multivitamin with minerals  1 tablet Oral Daily  . pantoprazole  40 mg Oral Daily  . potassium chloride  10 mEq Oral QODAY  . sertraline  50 mg Oral q morning - 10a  . sodium chloride  3 mL Intravenous Q12H  . vitamin B-12  1,000 mcg Oral Daily   Continuous Infusions:  PRN Meds:.sodium chloride, acetaminophen, clorazepate, nitroGLYCERIN, ondansetron (ZOFRAN) IV, sodium chloride,  zolpidem  Assessment/Plan: Principal Problem:   Unstable angina Active Problems:   OBSTRUCTIVE SLEEP APNEA   CAD, with hx of MI 2007 with BMS STENT to LAD PCI in 2009 for angina as well, Last cath 2010 with occluded LAD stent, last nuc study 2012   PAF (paroxysmal atrial fibrillation)   PAD (peripheral artery disease), history of stenting to both SFAs   Hyperlipidemia LDL goal < 70   Chronic anticoagulation, on coumadin   Headache  PLAN:INR continues to be elevated.  By tomorrow hope ready for cath tomorrow.  Negative MI, Cholesterol stable. BP elevated will increase IMDUR,  Stopped clonidine was on half of 0.1 mg tab daily to see if H/A will improve.  Could add norvasc.   Will add 20 meq K+ to increase K+ level.   LOS: 1 day   Time spent with pt. :15 minutes. [facility] R  Nurse Practitioner Certified Pager [phone] 05/31/2013, 9:20 AM   I have seen and examined the patient along with Nada Boozer, NP.  I have reviewed the chart, notes and new data.  I agree with NP's note.  Key new complaints: Is a persistent headache that has been going on for months if not years Key examination changes: No clinical signs of heart failure or arrhythmia Key new findings / data: Has had repeated nuclear stress test with false positive findings. Needs coronary angiography for definitive diagnosis. INR today is 2.30, on warfarin for paroxysmal atrial fibrillation, currently in sinus rhythm PLAN: Coronary angiography and possible percutaneous revascularization once a high normal less than 1.8  Thurmon Fair, MD, [facility] [phone] 05/31/2013, 11:58 AM

## 2013-05-31 NOTE — Progress Notes (Signed)
Patient places CPAP on self. Instructed to call if any assistance is needed. RT will continue to monitor.

## 2013-05-31 NOTE — Progress Notes (Signed)
ANTICOAGULATION CONSULT NOTE - Follow Up Consult  Pharmacy Consult for Heparin  Indication: chest pain/ACS  Allergies  Allergen Reactions  . Ciprofloxacin     unknown  . Codeine     "knocked me out"  . Dexamethasone     unknown  . Sulfonamide Derivatives     Unknown reaction  . Telmisartan     unknown    Patient Measurements: Height: 5\' 7"  (170.2 cm) Weight: 202 lb 13.2 oz (92 kg) IBW/kg (Calculated) : 66.1 Heparin Dosing Weight:   Vital Signs: Temp: 98.2 F (36.8 C) (09/08 0500) BP: 150/57 mmHg (09/08 0500) Pulse Rate: 62 (09/08 0500)  Labs:  Recent Labs  05/30/13 0816 05/30/13 1435 05/30/13 1930 05/31/13 0153  HGB 13.2  --   --  13.7  HCT 38.0*  --   --  40.9  PLT 199  --   --  209  LABPROT 25.0*  --   --  24.7*  INR 2.36*  --   --  2.32*  CREATININE 1.28  --   --  1.15  TROPONINI  --  <0.30 <0.30 <0.30    Estimated Creatinine Clearance: 53.6 ml/min (by C-G formula based on Cr of 1.15).   Medications:  Scheduled:  . amitriptyline  10 mg Oral QPM  . amLODipine  5 mg Oral Daily  . aspirin EC  81 mg Oral Daily  . atorvastatin  40 mg Oral QPM  . enalapril  20 mg Oral QPM  . fesoterodine  8 mg Oral Daily  . furosemide  40 mg Oral Daily  . isosorbide mononitrate  60 mg Oral q morning - 10a   And  . isosorbide mononitrate  30 mg Oral QPM  . metoprolol succinate  50 mg Oral BID  . multivitamin with minerals  1 tablet Oral Daily  . pantoprazole  40 mg Oral Daily  . potassium chloride  10 mEq Oral QODAY  . potassium chloride  20 mEq Oral Once  . sertraline  50 mg Oral q morning - 10a  . sodium chloride  3 mL Intravenous Q12H  . vitamin B-12  1,000 mcg Oral Daily    Assessment: 79yo male on Coumadin pta for AFib.  INR remain therapeutic this AM, minimally decreased.  No problems noted.  Goal of Therapy:  Heparin level 0.3-0.7 units/ml Monitor platelets by anticoagulation protocol: Yes   Plan:  1.  F/U AM INR, start Heparin when INR <  2  Marisue Humble, PharmD Clinical Pharmacist [facility]

## 2013-05-31 NOTE — Progress Notes (Signed)
Nutrition Brief Note  Patient identified on the Malnutrition Screening Tool (MST) Report. Pt reports weight loss, however per chart review, pt with approximately 7% wt loss x 3 months. This is not significant and is beneficial given pt's obesity.    Wt Readings from Last 15 Encounters:  05/31/13 202 lb 13.2 oz (92 kg)  03/18/13 214 lb 14.4 oz (97.478 kg)  09/11/12 219 lb 12.8 oz (99.701 kg)  01/22/12 221 lb (100.245 kg)  11/27/11 213 lb 6.4 oz (96.798 kg)  09/18/11 207 lb 12.8 oz (94.257 kg)  09/13/10 211 lb 8 oz (95.936 kg)  08/24/09 217 lb 4 oz (98.544 kg)    Body mass index is 31.76 kg/(m^2). Patient meets criteria for Obese Class II based on current BMI.   Current diet order is Heart Healthy, patient is consuming approximately 50% of meals at this time. Labs and medications reviewed.   No nutrition interventions warranted at this time. If nutrition issues arise, please consult RD.   Jarold Motto MS, RD, LDN Pager: [phone] After-hours pager: [phone]

## 2013-05-31 NOTE — Progress Notes (Signed)
Patient placed himself on cpap. Patient tolerated well.

## 2013-05-31 NOTE — Progress Notes (Signed)
Utilization review completed.  

## 2013-05-31 NOTE — Progress Notes (Signed)
Pt is tentatively for cath tomorrow with Dr. Herbie Baltimore if INR <1.8.

## 2013-05-31 NOTE — Telephone Encounter (Signed)
Called refill to pharmacy x 4.

## 2013-06-01 ENCOUNTER — Encounter (HOSPITAL_COMMUNITY)
Admission: EM | Disposition: A | Discharge status: Home / Self Care | Payer: Self-pay | Source: Home / Self Care | Attending: Cardiology

## 2013-06-01 DIAGNOSIS — I2510 Atherosclerotic heart disease of native coronary artery without angina pectoris: Secondary | ICD-10-CM | POA: Diagnosis not present

## 2013-06-01 HISTORY — PX: LEFT HEART CATHETERIZATION WITH CORONARY ANGIOGRAM: SHX5451

## 2013-06-01 HISTORY — PX: CARDIAC CATHETERIZATION: SHX172

## 2013-06-01 LAB — BASIC METABOLIC PANEL
Calcium: 8.8 mg/dL (ref 8.4–10.5)
GFR calc Af Amer: 67 mL/min — ABNORMAL LOW (ref >90)
GFR calc non Af Amer: 58 mL/min — ABNORMAL LOW (ref >90)
Glucose, Bld: 94 mg/dL (ref 70–99)
Sodium: 142 mEq/L (ref 135–145)

## 2013-06-01 LAB — CBC
HCT: 41.1 % (ref 39.0–52.0)
MCHC: 33.3 g/dL (ref 30.0–36.0)
RDW: 14 % (ref 11.5–15.5)

## 2013-06-01 LAB — PROTIME-INR: INR: 1.63 — ABNORMAL HIGH (ref 0.00–1.49)

## 2013-06-01 SURGERY — LEFT HEART CATHETERIZATION WITH CORONARY ANGIOGRAM
Anesthesia: LOCAL

## 2013-06-01 MED ORDER — LIDOCAINE HCL (PF) 1 % IJ SOLN
INTRAMUSCULAR | Status: AC
  Filled 2013-06-01: qty 30

## 2013-06-01 MED ORDER — SODIUM CHLORIDE 0.9 % IJ SOLN: 3.0000 mL | INTRAMUSCULAR | Status: DC | PRN

## 2013-06-01 MED ORDER — VERAPAMIL HCL 2.5 MG/ML IV SOLN
INTRAVENOUS | Status: AC
  Filled 2013-06-01: qty 2

## 2013-06-01 MED ORDER — SODIUM CHLORIDE 0.9 % IV SOLN: 250.0000 mL | INTRAVENOUS | Status: DC | PRN

## 2013-06-01 MED ORDER — HEPARIN SODIUM (PORCINE) 1000 UNIT/ML IJ SOLN
INTRAMUSCULAR | Status: AC
  Filled 2013-06-01: qty 1

## 2013-06-01 MED ORDER — HEPARIN (PORCINE) IN NACL 2-0.9 UNIT/ML-% IJ SOLN
INTRAMUSCULAR | Status: AC
  Filled 2013-06-01: qty 1000

## 2013-06-01 MED ORDER — NITROGLYCERIN 0.2 MG/ML ON CALL CATH LAB
INTRAVENOUS | Status: AC
  Filled 2013-06-01: qty 1

## 2013-06-01 MED ORDER — MIDAZOLAM HCL 2 MG/2ML IJ SOLN
INTRAMUSCULAR | Status: AC
  Filled 2013-06-01: qty 2

## 2013-06-01 MED ORDER — WARFARIN SODIUM 6 MG PO TABS
6.0000 mg | ORAL_TABLET | Freq: Once | ORAL | Status: AC
  Administered 2013-06-01: 6 mg via ORAL
  Filled 2013-06-01: qty 1

## 2013-06-01 MED ORDER — SODIUM CHLORIDE 0.9 % IJ SOLN
3.0000 mL | Freq: Two times a day (BID) | INTRAMUSCULAR | Status: DC
  Administered 2013-06-02: 3 mL via INTRAVENOUS

## 2013-06-01 MED ORDER — WARFARIN - PHARMACIST DOSING INPATIENT: Freq: Every day | Status: DC

## 2013-06-01 MED ORDER — FENTANYL CITRATE 0.05 MG/ML IJ SOLN
INTRAMUSCULAR | Status: AC
  Filled 2013-06-01: qty 2

## 2013-06-01 MED ORDER — SODIUM CHLORIDE 0.9 % IV SOLN: 1.0000 mL/kg/h | INTRAVENOUS | Status: AC

## 2013-06-01 NOTE — Progress Notes (Signed)
Right radial TR band removed without complications.  2x2 gauze and opsite applied.  Pt. With positive reverse allen's test.  BP 139/56 O2 sat 96% on CPAP HR 62 RR 16.  Will continue to monitor.

## 2013-06-01 NOTE — CV Procedure (Addendum)
CARDIAC CATHETERIZATION REPORT  NAME:  Gavin [name]   MRN: [id number] DOB:  22-Nov-1928   ADMIT DATE: 05/30/2013 Procedure Date: 06/01/2013  INTERVENTIONAL CARDIOLOGIST: Marykay Lex, M.D., MS PRIMARY CARE PROVIDER: Lucila Maine, MD PRIMARY CARDIOLOGIST: Runell Gess., MD  PATIENT:  Gavin [name] is a 79 y.o. male with known 100% of previously intervened upon mid LAD as well as prior RCA PCI, who presented with severe SSCP that awoke him from sleep, concerning for Unstable Angina.  He is referred for invasive evaluation with cardiac catheterization. Please see Admission H&P for full details.  PRE-OPERATIVE DIAGNOSIS:    UNSTABLE ANGINA  PROCEDURES PERFORMED:    LEFT HEART CATHETERIZATION WITH NATIVE CORONARY ANGIOGRAPHY  PROCEDURE:Consent:  Risks of procedure as well as the alternatives and risks of each were explained to the (patient/caregiver).  Consent for procedure obtained. Consent for signed by MD and patient with RN witness -- placed on chart.   PROCEDURE: The patient was brought to the 2nd Floor [facility] in the fasting state and prepped and draped in the usual sterile fashion for Right groin or radial access. A modified Allen's test with plethysmography was performed, revealing excellent Ulnar artery collateral flow.  Sterile technique was used including antiseptics, cap, gloves, gown, hand hygiene, mask and sheet.  Skin prep: Chlorhexidine.  Time Out: Verified patient identification, verified procedure, site/side was marked, verified correct patient position, special equipment/implants available, medications/allergies/relevent history reviewed, required imaging and test results available.  Performed  Access: RIGHT RADIAL Artery; 6 Fr Sheath -- Seldinger technique   Radial Cocktail - 10 ml IA, IV Heparin - 4500 Untis Diagnostic:  TIG 4.0, Angled Pigtail advanced & exchanged over long exchange Safety-J-wire  Left & Right Coronary Artery  Angiography: TIG 4.0  LV Hemodynamics (LV Gram): Angled Pigtail  TR Band:  1020 Hours, 13 mL air  MEDICATIONS:  Anesthesia:  Local Lidocaine 2 ml  Sedation:  1 mg IV Versed, 25 mcg IV fentanyl ;   Premedication: 2 mg PO Valium  Omnipaque Contrast: 70 ml  Anticoagulation:  IV Heparin 4500 Units  Hemodynamics:  Central Aortic / Mean Pressures: 135/56 mmHg; 86 mmHg  Left Ventricular Pressures / EDP: 135/13 mmHg; 16 mmHg  Left Ventriculography:  EF: 60% %  Wall Motion: Apical akinesis - as expected.  Coronary Anatomy:  Left Main: Large caliber vessel, bifurcates into LAD & Circumflex, Angiographically normal LAD: Large caliber vessel that is 100% occluded at the bifurcation with D2 at the proximal edge on the numerous stents. There is faint distal filling from the Diagonal & septal collaterals  D1: moderate caliber vessel, ostial ~50-60% stenosis, remainder is free of disease. This covers a large portion of the anterolateral wall.  D2: Small caliber vessel that takes off from an occluded stent, just beyond a major septal trunk.  It does give collaterals to a distal diagonal branch that faintly fills the distal LAD. Left Circumflex: Moderate to Large caliber vessel with two small caliber OMs.  It terminates as a bifurcating inferolateral OM. Angiographically normal. There is ~40% ostial disease.   RCA: Large caliber dominant vessel with a widely patent proximal stent at the initial bend.  Just after the distal edge of the stent there is a ~50-60% focal stenosis in bend 2.  The remainder of the vessel is free of significant disease.  There are 2 major, small caliber before it bifurcates into a small caliber Right Posterior Descending Artery and the  Right Posterior AV Groove  Branch (RPAV) that bifurcates distally into 2 RPL branches.  PATIENT DISPOSITION:    The patient was transferred to the PACU holding area in a hemodynamicaly stable, chest pain free condition.  The  patient tolerated the procedure well, and there were no complications.  EBL:   < 10 ml  The patient was stable before, during, and after the procedure. Very small wrist hematoma.  POST-OPERATIVE DIAGNOSIS:    Moderate RCA, ostial Circumflex, and ostial D1 and known distal LAD occlusion.    No obvious culprit lesion of Unstable Angina is identified.  Preserved LVEF with expected apical akinesis.  PLAN OF CARE:  Medical therapy -- will review films with Dr. Allyson Sabal to determine if either of the moderate lesions may warrant further evaluation vs. PCI.  Anticipate d/c in AM after hydration -- will need to re-initiate warfarin.   Marykay Lex, M.D., M.S. [facility] [address] [place], [place]  [address]  [phone]  06/01/2013 10:27 AM

## 2013-06-01 NOTE — Progress Notes (Signed)
   Subjective: Just the "slightest"lt side of chest, but no angina. Still having trouble sleeping with hospital CPap.   Objective: Vital signs in last 24 hours: Temp:  [97.5 F (36.4 C)-98 F (36.7 C)] 98 F (36.7 C) (09/09 0614) Pulse Rate:  [60-72] 62 (09/09 0614) Resp:  [18] 18 (09/09 0614) BP: (135-208)/(72-99) 135/72 mmHg (09/09 0614) SpO2:  [97 %-99 %] 97 % (09/09 0614) Weight:  [201 lb 3.2 oz (91.264 kg)-202 lb 13.2 oz (92 kg)] 201 lb 3.2 oz (91.264 kg) (09/09 0614) Weight change: -3 lb 2.8 oz (-1.441 kg) Last BM Date: 05/30/13 Intake/Output from previous day:+200 09/08 0701 - 09/09 0700 In: 601 [P.O.:600; I.V.:1] Out: 200 [Urine:200] Intake/Output this shift:    PE: General:Pleasant affect, NAD Skin:Warm and dry, brisk capillary refill HEENT:normocephalic, sclera clear, mucus membranes moist Heart:S1S2 RRR without murmur, gallup, rub or click Lungs:clear without rales, rhonchi, or wheezes Abd:soft, non tender, + BS, do not palpate liver spleen or masses Ext:no lower ext edema, 2+ pedal pulses, 2+ radial pulses Neuro:alert and oriented x3, follows commands, + facial symmetry   Lab Results:  Recent Labs  05/31/13 0153 06/01/13 0520  WBC 9.9 9.7  HGB 13.7 13.7  HCT 40.9 41.1  PLT 209 218   BMET  Recent Labs  05/31/13 0153 06/01/13 0520  NA 142 142  K 3.5 3.6  CL 105 106  CO2 28 28  GLUCOSE 104* 94  BUN 17 16  CREATININE 1.15 1.14  CALCIUM 8.4 8.8    Recent Labs  05/30/13 1930 05/31/13 0153  TROPONINI <0.30 <0.30    Lab Results  Component Value Date   CHOL 108 05/31/2013   HDL 37* 05/31/2013   LDLCALC 48 05/31/2013   TRIG 114 05/31/2013   CHOLHDL 2.9 05/31/2013   Studies/Results: No new CXR  Medications: I have reviewed the patient's current medications.  See Epic Scheduled Meds:  Assessment/Plan: Principal Problem:   Unstable angina Active Problems:   OBSTRUCTIVE SLEEP APNEA   CAD, with hx of MI 2007 with BMS STENT to LAD PCI in 2009 for  angina as well, Last cath 2010 with occluded LAD stent, last nuc study 2012   PAF (paroxysmal atrial fibrillation)   PAD (peripheral artery disease), history of stenting to both SFAs   Hyperlipidemia LDL goal < 70   Chronic anticoagulation, on coumadin   Headache   LOS: 2 days   Key new complaints: headache- stable Key examination changes: No clinical signs of heart failure or arrhythmia Key new findings / data: Has had repeated nuclear stress test with false positive findings. Needs coronary angiography for definitive diagnosis. INR today is 1.6 -- on warfarin for paroxysmal atrial fibrillation, currently in sinus rhythm; Renal fxn & K + stable. PLAN: Coronary angiography and possible percutaneous revascularization today  Aneeka Bowden W, M.D., M.S. [facility] [address] [place], [place]  [address]  [phone] Pager # [phone] 06/01/2013 8:09 AM   

## 2013-06-01 NOTE — Progress Notes (Signed)
ANTICOAGULATION CONSULT NOTE - Follow Up Consult  Pharmacy Consult for Coumadin Indication: afib  Allergies  Allergen Reactions  . Ciprofloxacin     unknown  . Codeine     "knocked me out"  . Dexamethasone     unknown  . Sulfonamide Derivatives     Unknown reaction  . Telmisartan     unknown    Patient Measurements: Height: 5\' 7"  (170.2 cm) Weight: 201 lb 3.2 oz (91.264 kg) IBW/kg (Calculated) : 66.1 Heparin Dosing Weight:   Vital Signs: Temp: 97.9 F (36.6 C) (09/09 0836) Temp src: Oral (09/09 0836) BP: 157/64 mmHg (09/09 1056) Pulse Rate: 63 (09/09 1056)  Labs:  Recent Labs  05/30/13 0816 05/30/13 1435 05/30/13 1930 05/31/13 0153 06/01/13 0520  HGB 13.2  --   --  13.7 13.7  HCT 38.0*  --   --  40.9 41.1  PLT 199  --   --  209 218  LABPROT 25.0*  --   --  24.7* 18.9*  INR 2.36*  --   --  2.32* 1.63*  CREATININE 1.28  --   --  1.15 1.14  TROPONINI  --  <0.30 <0.30 <0.30  --     Estimated Creatinine Clearance: 53.8 ml/min (by C-G formula based on Cr of 1.14).   Medications:  Scheduled:  . amitriptyline  10 mg Oral QPM  . amLODipine  5 mg Oral Daily  . aspirin EC  81 mg Oral Daily  . atorvastatin  40 mg Oral QPM  . enalapril  20 mg Oral QPM  . fesoterodine  8 mg Oral Daily  . furosemide  40 mg Oral Daily  . isosorbide mononitrate  60 mg Oral q morning - 10a   And  . isosorbide mononitrate  30 mg Oral QPM  . metoprolol succinate  50 mg Oral BID  . multivitamin with minerals  1 tablet Oral Daily  . pantoprazole  40 mg Oral Daily  . potassium chloride  10 mEq Oral QODAY  . sertraline  50 mg Oral q morning - 10a  . sodium chloride  3 mL Intravenous Q12H  . sodium chloride  3 mL Intravenous Q12H  . vitamin B-12  1,000 mcg Oral Daily    Assessment: 79yo man to resume coumadin post cath for AFib.  INR 1.63 today Goal of Therapy:  INR 2-3 Monitor platelets by anticoagulation protocol: Yes   Plan:  1.  Coumadin 6 mg today 2.  Daily  PT/INR  Talbert Cage, PharmD Clinical Pharmacist [facility]

## 2013-06-01 NOTE — Interval H&P Note (Signed)
History and Physical Interval Note:  06/01/2013 8:11 AM  Gavin [name]  has presented today for surgery, with the diagnosis of Unstable Angina. The various methods of treatment have been discussed with the patient and family. After consideration of risks, benefits and other options for treatment, the patient has consented to  Procedure(s): LEFT HEART CATHETERIZATION WITH CORONARY ANGIOGRAM (N/A)  +/- PCI as a surgical intervention .  The patient's history has been reviewed, patient examined, no change in status, stable for surgery.  I have reviewed the patient's chart and labs.  Questions were answered to the patient's satisfaction.    Risks / Complications include, but not limited to: Death, MI, CVA/TIA, VF/VT (with defibrillation), Bradycardia (need for temporary pacer placement), contrast induced nephropathy, bleeding / bruising / hematoma / pseudoaneurysm, vascular or coronary injury (with possible emergent CT or Vascular Surgery), adverse medication reactions, infection.    HARDING,DAVID W  Cath Lab Visit (complete for each Cath Lab visit)  Clinical Evaluation Leading to the Procedure:   ACS: yes - Unstable Angina  Non-ACS:    Anginal Classification: Angina @ Rest.  Unstable Angina  Anti-ischemic medical therapy: Maximal Therapy (2 or more classes of medications)  Non-Invasive Test Results: Intermediate-risk stress test findings: cardiac mortality 1-3%/year  Prior CABG: No previous CABG

## 2013-06-01 NOTE — H&P (View-Only) (Signed)
   Subjective: Just the "slightest"lt side of chest, but no angina. Still having trouble sleeping with hospital CPap.   Objective: Vital signs in last 24 hours: Temp:  [97.5 F (36.4 C)-98 F (36.7 C)] 98 F (36.7 C) (09/09 0981) Pulse Rate:  [60-72] 62 (09/09 0614) Resp:  [18] 18 (09/09 0614) BP: (135-208)/(72-99) 135/72 mmHg (09/09 0614) SpO2:  [97 %-99 %] 97 % (09/09 0614) Weight:  [201 lb 3.2 oz (91.264 kg)-202 lb 13.2 oz (92 kg)] 201 lb 3.2 oz (91.264 kg) (09/09 1914) Weight change: -3 lb 2.8 oz (-1.441 kg) Last BM Date: 05/30/13 Intake/Output from previous day:+200 09/08 0701 - 09/09 0700 In: 601 [P.O.:600; I.V.:1] Out: 200 [Urine:200] Intake/Output this shift:    PE: General:Pleasant affect, NAD Skin:Warm and dry, brisk capillary refill HEENT:normocephalic, sclera clear, mucus membranes moist Heart:S1S2 RRR without murmur, gallup, rub or click Lungs:clear without rales, rhonchi, or wheezes NWG:NFAO, non tender, + BS, do not palpate liver spleen or masses Ext:no lower ext edema, 2+ pedal pulses, 2+ radial pulses Neuro:alert and oriented x3, follows commands, + facial symmetry   Lab Results:  Recent Labs  05/31/13 0153 06/01/13 0520  WBC 9.9 9.7  HGB 13.7 13.7  HCT 40.9 41.1  PLT 209 218   BMET  Recent Labs  05/31/13 0153 06/01/13 0520  NA 142 142  K 3.5 3.6  CL 105 106  CO2 28 28  GLUCOSE 104* 94  BUN 17 16  CREATININE 1.15 1.14  CALCIUM 8.4 8.8    Recent Labs  05/30/13 1930 05/31/13 0153  TROPONINI <0.30 <0.30    Lab Results  Component Value Date   CHOL 108 05/31/2013   HDL 37* 05/31/2013   LDLCALC 48 05/31/2013   TRIG 114 05/31/2013   CHOLHDL 2.9 05/31/2013   Studies/Results: No new CXR  Medications: I have reviewed the patient's current medications.  See Epic Scheduled Meds:  Assessment/Plan: Principal Problem:   Unstable angina Active Problems:   OBSTRUCTIVE SLEEP APNEA   CAD, with hx of MI 2007 with BMS STENT to LAD PCI in 2009 for  angina as well, Last cath 2010 with occluded LAD stent, last nuc study 2012   PAF (paroxysmal atrial fibrillation)   PAD (peripheral artery disease), history of stenting to both SFAs   Hyperlipidemia LDL goal < 70   Chronic anticoagulation, on coumadin   Headache   LOS: 2 days   Key new complaints: headache- stable Key examination changes: No clinical signs of heart failure or arrhythmia Key new findings / data: Has had repeated nuclear stress test with false positive findings. Needs coronary angiography for definitive diagnosis. INR today is 1.6 -- on warfarin for paroxysmal atrial fibrillation, currently in sinus rhythm; Renal fxn & K + stable. PLAN: Coronary angiography and possible percutaneous revascularization today  HARDING,DAVID W, M.D., M.S. [facility] [address] [place], [place]  [address]  [phone] Pager # [phone] 06/01/2013 8:09 AM

## 2013-06-02 ENCOUNTER — Encounter (HOSPITAL_COMMUNITY): Payer: Self-pay | Admitting: Cardiology

## 2013-06-02 ENCOUNTER — Ambulatory Visit: Payer: Medicare Other | Admitting: Cardiovascular Disease

## 2013-06-02 DIAGNOSIS — R079 Chest pain, unspecified: Secondary | ICD-10-CM | POA: Diagnosis not present

## 2013-06-02 DIAGNOSIS — I2510 Atherosclerotic heart disease of native coronary artery without angina pectoris: Secondary | ICD-10-CM | POA: Diagnosis not present

## 2013-06-02 DIAGNOSIS — Z7901 Long term (current) use of anticoagulants: Secondary | ICD-10-CM | POA: Diagnosis not present

## 2013-06-02 DIAGNOSIS — E785 Hyperlipidemia, unspecified: Secondary | ICD-10-CM | POA: Diagnosis not present

## 2013-06-02 LAB — PROTIME-INR
INR: 1.39 (ref 0.00–1.49)
Prothrombin Time: 16.7 seconds — ABNORMAL HIGH (ref 11.6–15.2)

## 2013-06-02 MED ORDER — ISOSORBIDE MONONITRATE ER 30 MG PO TB24
30.0000 mg | ORAL_TABLET | Freq: Every evening | ORAL | Status: DC
  Filled 2013-06-02: qty 1

## 2013-06-02 MED ORDER — RANOLAZINE ER 500 MG PO TB12
500.0000 mg | ORAL_TABLET | Freq: Two times a day (BID) | ORAL | Status: DC
  Administered 2013-06-02: 500 mg via ORAL
  Filled 2013-06-02 (×2): qty 1

## 2013-06-02 MED ORDER — ISOSORBIDE MONONITRATE ER 30 MG PO TB24
30.0000 mg | ORAL_TABLET | Freq: Every morning | ORAL | Status: DC
Start: 2013-06-03 — End: 2013-06-02

## 2013-06-02 MED ORDER — ACETAMINOPHEN 325 MG PO TABS: 650.0000 mg | ORAL_TABLET | ORAL | Status: DC | PRN

## 2013-06-02 MED ORDER — RANOLAZINE ER 500 MG PO TB12: 500.0000 mg | ORAL_TABLET | Freq: Two times a day (BID) | ORAL | Status: DC

## 2013-06-02 MED ORDER — AMLODIPINE BESYLATE 10 MG PO TABS: 10.0000 mg | ORAL_TABLET | Freq: Every day | ORAL | Status: DC

## 2013-06-02 MED ORDER — AMLODIPINE BESYLATE 5 MG PO TABS: 5.0000 mg | ORAL_TABLET | Freq: Every day | ORAL | Status: DC

## 2013-06-02 MED ORDER — KETOROLAC TROMETHAMINE 30 MG/ML IJ SOLN
15.0000 mg | Freq: Once | INTRAMUSCULAR | Status: AC
  Administered 2013-06-02: 15 mg via INTRAVENOUS
  Filled 2013-06-02: qty 1

## 2013-06-02 MED ORDER — WARFARIN SODIUM 6 MG PO TABS
6.0000 mg | ORAL_TABLET | Freq: Once | ORAL | Status: DC
  Filled 2013-06-02: qty 1

## 2013-06-02 MED ORDER — ISOSORBIDE MONONITRATE ER 30 MG PO TB24: 30.0000 mg | ORAL_TABLET | Freq: Two times a day (BID) | ORAL | Status: DC

## 2013-06-02 NOTE — Progress Notes (Signed)
Patient ambulated in hallway without CP.  Dr. Herbie Baltimore notified.  Will continue to monitor. [name], [name]

## 2013-06-02 NOTE — Progress Notes (Signed)
Subjective:  No chest pain.Still has headache, no change.  Objective:  Vital Signs in the last 24 hours: Temp:  [97.4 F (36.3 C)-98 F (36.7 C)] 98 F (36.7 C) (09/10 0515) Pulse Rate:  [60-74] 63 (09/10 1006) Resp:  [16-18] 18 (09/10 0515) BP: (124-196)/(50-73) 124/50 mmHg (09/10 1006) SpO2:  [93 %-98 %] 94 % (09/10 0515) Weight:  [204 lb (92.534 kg)] 204 lb (92.534 kg) (09/10 0515)  Intake/Output from previous day:  Intake/Output Summary (Last 24 hours) at 06/02/13 1027 Last data filed at 06/02/13 1011  Gross per 24 hour  Intake    840 ml  Output      0 ml  Net    840 ml    Physical Exam: General appearance: alert, cooperative and no distress Lungs: clear to auscultation bilaterally Heart: regular rate and rhythm Rt wrist site without hematoma - bruising. Abd: soft/NT/ND/NABS   Rate: 64  Rhythm: normal sinus rhythm  Lab Results:  Recent Labs  05/31/13 0153 06/01/13 0520  WBC 9.9 9.7  HGB 13.7 13.7  PLT 209 218    Recent Labs  05/31/13 0153 06/01/13 0520  NA 142 142  K 3.5 3.6  CL 105 106  CO2 28 28  GLUCOSE 104* 94  BUN 17 16  CREATININE 1.15 1.14    Recent Labs  05/30/13 1930 05/31/13 0153  TROPONINI <0.30 <0.30    Recent Labs  06/02/13 0520  INR 1.39    Imaging: Imaging results have been reviewed  Cardiac Studies:  Assessment/Plan:   Principal Problem:   Unstable angina Active Problems:   CAD, Multiple PCIs to  LAD, as well as RCA and Dx with ultimate LAD occlusion Jan 2010.   Cath 06/01/13- medical Rx   PAF (paroxysmal atrial fibrillation)   PAD (peripheral artery disease), history of stenting to both SFAs   OBSTRUCTIVE SLEEP APNEA   COPD   Hyperlipidemia LDL goal < 70   Chronic anticoagulation, on coumadin   Headache, chronic, no change off Imdur  PLAN: Possible discharge today, MD to see  Corine Shelter PA-C Beeper [phone] 06/02/2013, 10:27 AM  I have seen and evaluated the patient this PM along with Corine Shelter, PA.  I agree with his findings, examination as well as impression recommendations.  No further CP since arrival. Has some bruising on the R wrist (not unexpected with TR band)  ? If his persistent HA is related to IMDUR -- will add Ranexa with the hopes that we can wean off Imdur. Reduce to 30 mg bid.  BP is up a bid, but we just added Amlodipine -- can increase to 10 mg daily, may help if rest CP was related to coronary spasm.  Will give 1 x dose of IV toradol for HA  If able to ambulate in hall without Angina & is stable, can d/c today. F/U with Dr. Allyson Sabal or PA/NP in ~1-2 weeks.  MD Time with pt: 15 min  HARDING,DAVID W, M.D., M.S. [facility] [address] [place], [place]  [address]  [phone] Pager # [phone] 06/02/2013 2:27 PM

## 2013-06-02 NOTE — Discharge Summary (Signed)
Patient ID: Gavin [name],  MRN: [id number], DOB/AGE: 1929-03-28 79 y.o.  Admit date: 05/30/2013 Discharge date: 06/02/2013  Primary Care Provider:  Primary Cardiologist: Dr Allyson Sabal  Discharge Diagnoses Principal Problem:   Chest pain with moderate risk for cardiac etiology - possible coronary spasm. Active Problems:   CAD, Multiple PCIs to  LAD, as well as RCA and Dx with ultimate LAD occlusion Jan 2010.   Cath 06/01/13- medical Rx   Unstable angina   PAF (paroxysmal atrial fibrillation)   PAD (peripheral artery disease), history of stenting to both SFAs   OBSTRUCTIVE SLEEP APNEA   COPD   Hyperlipidemia LDL goal < 70   Chronic anticoagulation, on coumadin   Headache, chronic, no change off Imdur    Procedures: Coronary angiogram 06/01/13   Hospital Course:  79 year-old white married male presents to ER today with chest pain, [place], that awakened him from sleep. He has a history of coronary disease with last cardiac catheterization July 2010 revealing an occluded LAD stent otherwise normal coronary arteries with, at that time, reduced EF of 30% subsequent 2-D echo in 2013 revealed EF is normal. Last stress test 2012 with mod to severe perfusion defect, maybe slightly worse than previous study. Pt was without symptoms so no further work up was done. He also has a history of PAF on Coumadin for anticoagulation and maintaining sinus rhythm. He has peripheral vascular disease with stenting by Dr. Allyson Sabal to both SFAs in 2004. 0 vessel runoff on the right and one on the left via peroneal Dopplers are followed he also has bilateral carotid disease left greater than right which we followed by duplex ultrasound through our office. Other history includes hypertension hyperlipidemia obstructive sleep apnea on CPAP. He has chronic headache with neg. head CT in June, and recently per our office stopped his IMDUR to see if Headache would resolve. It did not. He resumed the Imdur 05/29/13. He was admitted 05/29/13 with  recurrent chest pain worrisome for unstable angina. His Coumadin was held. His Troponin was negative. He underwent coronary angiogram 06/01/13. This revealed moderate RCA, ostial CFX, and distal Dx1 disease with know distal LAD occlusion. There was no obvious culprit lesion and the plan is for medical Rx. His Coumadin has been resumed. Dr Herbie Baltimore feels he can be discharged 06/02/13 and he will be set up to see Dr Allyson Sabal as an OP.   Discharge Vitals:  Blood pressure 153/59, pulse 66, temperature 97.7 F (36.5 C), temperature source Oral, resp. rate 18, height 5\' 7"  (1.702 m), weight 204 lb (92.534 kg), SpO2 99.00%.    Labs: Results for orders placed during the hospital encounter of 05/30/13 (from the past 48 hour(s))  BASIC METABOLIC PANEL     Status: Abnormal   Collection Time    06/01/13  5:20 AM      Result Value Range   Sodium 142  135 - 145 mEq/L   Potassium 3.6  3.5 - 5.1 mEq/L   Chloride 106  96 - 112 mEq/L   CO2 28  19 - 32 mEq/L   Glucose, Bld 94  70 - 99 mg/dL   BUN 16  6 - 23 mg/dL   Creatinine, Ser 4.09  0.50 - 1.35 mg/dL   Calcium 8.8  8.4 - 81.1 mg/dL   GFR calc non Af Amer 58 (*) >90 mL/min   GFR calc Af Amer 67 (*) >90 mL/min   Comment: (NOTE)     The eGFR has been calculated using the  CKD EPI equation.     This calculation has not been validated in all clinical situations.     eGFR's persistently <90 mL/min signify possible Chronic Kidney     Disease.  PROTIME-INR     Status: Abnormal   Collection Time    06/01/13  5:20 AM      Result Value Range   Prothrombin Time 18.9 (*) 11.6 - 15.2 seconds   INR 1.63 (*) 0.00 - 1.49  CBC     Status: None   Collection Time    06/01/13  5:20 AM      Result Value Range   WBC 9.7  4.0 - 10.5 K/uL   RBC 4.26  4.22 - 5.81 MIL/uL   Hemoglobin 13.7  13.0 - 17.0 g/dL   HCT 16.1  09.6 - 04.5 %   MCV 96.5  78.0 - 100.0 fL   MCH 32.2  26.0 - 34.0 pg   MCHC 33.3  30.0 - 36.0 g/dL   RDW 40.9  81.1 - 91.4 %   Platelets 218  150 - 400  K/uL  PROTIME-INR     Status: Abnormal   Collection Time    06/02/13  5:20 AM      Result Value Range   Prothrombin Time 16.7 (*) 11.6 - 15.2 seconds   INR 1.39  0.00 - 1.49    Disposition:  Follow-up Information   Follow up with Runell Gess, MD On 06/25/2013. (10:15 am)    Specialty:  Cardiology   Contact information:   [address] [place] [address] [phone]       Discharge Medications:    Medication List    STOP taking these medications       cloNIDine 0.1 MG tablet  Commonly known as:  CATAPRES      TAKE these medications       acetaminophen 325 MG tablet  Commonly known as:  TYLENOL  Take 2 tablets (650 mg total) by mouth every 4 (four) hours as needed.     amitriptyline 10 MG tablet  Commonly known as:  ELAVIL  Take 10 mg by mouth every evening.     amLODipine 10 MG tablet  Commonly known as:  NORVASC  Take 1 tablet (10 mg total) by mouth daily.  Start taking on:  06/03/2013     aspirin 81 MG tablet  Take 81 mg by mouth daily.     atorvastatin 40 MG tablet  Commonly known as:  LIPITOR  Take 40 mg by mouth every evening.     clorazepate 7.5 MG tablet  Commonly known as:  TRANXENE  Take 7.5 mg by mouth 2 (two) times daily as needed for anxiety.     diclofenac sodium 1 % Gel  Commonly known as:  VOLTAREN  Apply 2 g topically daily as needed (pain).     enalapril 20 MG tablet  Commonly known as:  VASOTEC  Take 20 mg by mouth every evening.     furosemide 40 MG tablet  Commonly known as:  LASIX  Take 40 mg by mouth daily.     isosorbide mononitrate 30 MG 24 hr tablet  Commonly known as:  IMDUR  Take 1 tablet (30 mg total) by mouth 2 (two) times daily. Take 1 tablet by mouth every morning and 1 tablet by mouth every evening     KLOR-CON M10 10 MEQ tablet  Generic drug:  potassium chloride  Take 10 mEq by mouth every other day.  loratadine 10 MG tablet  Commonly known as:  CLARITIN  Take 10 mg by mouth daily.      LUTEIN VISION BLEND PO  Take 1 tablet by mouth 2 (two) times daily.     metoprolol succinate 50 MG 24 hr tablet  Commonly known as:  TOPROL-XL  Take 50 mg by mouth 2 (two) times daily. Take with or immediately following a meal.     multivitamin tablet  Take 1 tablet by mouth daily.     NASONEX 50 MCG/ACT nasal spray  Generic drug:  mometasone  Place 2 sprays into the nose 2 (two) times daily.     nitroGLYCERIN 0.4 MG SL tablet  Commonly known as:  NITROSTAT  Place 0.4 mg under the tongue every 5 (five) minutes as needed.     pantoprazole 40 MG tablet  Commonly known as:  PROTONIX  Take 40 mg by mouth daily.     ranolazine 500 MG 12 hr tablet  Commonly known as:  RANEXA  Take 1 tablet (500 mg total) by mouth 2 (two) times daily.     sertraline 50 MG tablet  Commonly known as:  ZOLOFT  Take 50 mg by mouth every morning.     TOVIAZ 8 MG Tb24 tablet  Generic drug:  fesoterodine  Take 8 mg by mouth daily.     vitamin B-12 1000 MCG tablet  Commonly known as:  CYANOCOBALAMIN  Take 1,000 mcg by mouth daily.     warfarin 5 MG tablet  Commonly known as:  COUMADIN  Take 2.5-5 mg by mouth daily. Takes 1/2 tablet Monday and Friday     zaleplon 10 MG capsule  Commonly known as:  SONATA  Take 10 mg by mouth at bedtime as needed (sleep).        Duration of Discharge Encounter: Greater than 30 minutes including physician time.  Signed, Corine Shelter PA-C 06/02/2013 5:18 PM  I have seen and evaluated the patient this PM along with Corine Shelter, PA. I agree with his summary & d/c recommendations.  No further CP since arrival.  Has some bruising on the R wrist (not unexpected with TR band)  ? If his persistent HA is related to IMDUR -- will add Ranexa with the hopes that we can wean off Imdur. Reduce to 30 mg bid.  BP is up a bid, but we just added Amlodipine -- can increase to 10 mg daily, may help if rest CP was related to coronary spasm.  Will give 1 x dose of IV toradol  for HA  If able to ambulate in hall without Angina & is stable, can d/c today. F/U with Dr. Allyson Sabal or PA/NP in ~1-2 weeks.

## 2013-06-02 NOTE — Progress Notes (Signed)
ANTICOAGULATION CONSULT NOTE - Follow Up Consult  Pharmacy Consult for Coumadin Indication: afib  Allergies  Allergen Reactions  . Ciprofloxacin     unknown  . Codeine     "knocked me out"  . Dexamethasone     unknown  . Sulfonamide Derivatives     Unknown reaction  . Telmisartan     unknown    Patient Measurements: Height: 5\' 7"  (170.2 cm) Weight: 204 lb (92.534 kg) IBW/kg (Calculated) : 66.1 Heparin Dosing Weight:   Vital Signs: Temp: 98 F (36.7 C) (09/10 0515) Temp src: Axillary (09/10 0515) BP: 124/50 mmHg (09/10 1006) Pulse Rate: 63 (09/10 1006)  Labs:  Recent Labs  05/30/13 1435 05/30/13 1930 05/31/13 0153 06/01/13 0520 06/02/13 0520  HGB  --   --  13.7 13.7  --   HCT  --   --  40.9 41.1  --   PLT  --   --  209 218  --   LABPROT  --   --  24.7* 18.9* 16.7*  INR  --   --  2.32* 1.63* 1.39  CREATININE  --   --  1.15 1.14  --   TROPONINI <0.30 <0.30 <0.30  --   --     Estimated Creatinine Clearance: 54.2 ml/min (by C-G formula based on Cr of 1.14).   Medications:  Scheduled:  . amitriptyline  10 mg Oral QPM  . amLODipine  5 mg Oral Daily  . aspirin EC  81 mg Oral Daily  . atorvastatin  40 mg Oral QPM  . enalapril  20 mg Oral QPM  . fesoterodine  8 mg Oral Daily  . furosemide  40 mg Oral Daily  . isosorbide mononitrate  60 mg Oral q morning - 10a   And  . isosorbide mononitrate  30 mg Oral QPM  . metoprolol succinate  50 mg Oral BID  . multivitamin with minerals  1 tablet Oral Daily  . pantoprazole  40 mg Oral Daily  . potassium chloride  10 mEq Oral QODAY  . sertraline  50 mg Oral q morning - 10a  . sodium chloride  3 mL Intravenous Q12H  . sodium chloride  3 mL Intravenous Q12H  . vitamin B-12  1,000 mcg Oral Daily  . Warfarin - Pharmacist Dosing Inpatient   Does not apply q1800    Assessment: 79yo man to resume coumadin post cath for AFib.  INR 1.39 today Goal of Therapy:  INR 2-3 Monitor platelets by anticoagulation protocol:  Yes   Plan:  1.  Coumadin 6 mg again today 2.  Daily PT/INR  Talbert Cage, PharmD Clinical Pharmacist [facility]

## 2013-06-02 NOTE — Progress Notes (Signed)
DC orders received.  Patient stable with no S/S of distress.  Medication and discharge information reviewed with patient and patient's wife.  Patient DC home. Mykai Wendorf Marie  

## 2013-06-03 ENCOUNTER — Telehealth: Payer: Self-pay | Admitting: Cardiovascular Disease

## 2013-06-03 DIAGNOSIS — N289 Disorder of kidney and ureter, unspecified: Secondary | ICD-10-CM | POA: Diagnosis not present

## 2013-06-03 DIAGNOSIS — N179 Acute kidney failure, unspecified: Secondary | ICD-10-CM | POA: Diagnosis not present

## 2013-06-03 DIAGNOSIS — G4733 Obstructive sleep apnea (adult) (pediatric): Secondary | ICD-10-CM | POA: Diagnosis not present

## 2013-06-03 DIAGNOSIS — E7800 Pure hypercholesterolemia, unspecified: Secondary | ICD-10-CM | POA: Diagnosis not present

## 2013-06-03 DIAGNOSIS — I509 Heart failure, unspecified: Secondary | ICD-10-CM | POA: Diagnosis not present

## 2013-06-03 DIAGNOSIS — R197 Diarrhea, unspecified: Secondary | ICD-10-CM | POA: Diagnosis not present

## 2013-06-03 DIAGNOSIS — I10 Essential (primary) hypertension: Secondary | ICD-10-CM | POA: Diagnosis not present

## 2013-06-03 DIAGNOSIS — E876 Hypokalemia: Secondary | ICD-10-CM | POA: Diagnosis not present

## 2013-06-03 DIAGNOSIS — E860 Dehydration: Secondary | ICD-10-CM | POA: Diagnosis not present

## 2013-06-03 DIAGNOSIS — I4891 Unspecified atrial fibrillation: Secondary | ICD-10-CM | POA: Diagnosis not present

## 2013-06-03 NOTE — Telephone Encounter (Signed)
Just discharged from the hospital yesterday.He had a horrible night-wants to know if she need to bring him in or carry him back to the hospital?

## 2013-06-03 NOTE — Telephone Encounter (Signed)
Returned call to Clydie Braun.  Stated pt has had about 3 oz of water and now has bloody stools.  Stated stools are mainly bloody w/ mucous and a little stool.  Pt still weak, but not shaky per wife.  Advised pt return to [facility] for evaluation.  Wife doesn't want pt to wait 3 hrs in ER and informed RN doesn't have control of that, but pt should be evaluated if stools are bloody.  Wife asked if pt needed to go via ambulance and informed pt can go via car, but should not drive himself.  Wife verbalized understanding.

## 2013-06-03 NOTE — Telephone Encounter (Signed)
Pt is calling in regards to his hospitalization. Pt wants to have someone call her back. When he goes to the bathroom he has blood.

## 2013-06-03 NOTE — Telephone Encounter (Signed)
Returned call to Clydie Braun, pt's wife.  Stated at 11:45pm pt w/ diarrhea, n/v, tremors, unable to stand, weakness and bad HA.  Stated pt did not get out of bathroom until 6am.  Stated every time pt tried to get out he had to go again.  Denied pt w/ CP/SOB.  Stated pt w/ lots of dizziness and lightheadedness.  Denied pt had these symptoms in the hospital.  Stated pt started two new meds last night: Norvasc and Ranexa.  Pt also took two tylenol.  Denied checking BP last night during this.  Will check now.  BP 123/54.  Did not get HR.  Informed BP is okay.  Wife stated pt still w/ tremors.    Luke, PA-C notified while checking BP and advised pt hold Ranexa for now and if pt w/ fever or symptoms continue to return to hospital for evaluation.  Also advised appt w/ him next week.  Wife informed.  Also advised clear liquids and possibly saltines (if able to tolerate).  Verbalized understanding and agreed w/ plan.  Appt scheduled for Tues, Sept. 16th at 2pm w/ Franky Macho, PA-C and INR check rescheduled to Tuesday at 1:50pm.

## 2013-06-04 DIAGNOSIS — K219 Gastro-esophageal reflux disease without esophagitis: Secondary | ICD-10-CM | POA: Diagnosis present

## 2013-06-04 DIAGNOSIS — Z9861 Coronary angioplasty status: Secondary | ICD-10-CM | POA: Diagnosis not present

## 2013-06-04 DIAGNOSIS — G4733 Obstructive sleep apnea (adult) (pediatric): Secondary | ICD-10-CM | POA: Diagnosis present

## 2013-06-04 DIAGNOSIS — Z79899 Other long term (current) drug therapy: Secondary | ICD-10-CM | POA: Diagnosis not present

## 2013-06-04 DIAGNOSIS — R197 Diarrhea, unspecified: Secondary | ICD-10-CM | POA: Diagnosis not present

## 2013-06-04 DIAGNOSIS — R112 Nausea with vomiting, unspecified: Secondary | ICD-10-CM | POA: Diagnosis present

## 2013-06-04 DIAGNOSIS — I509 Heart failure, unspecified: Secondary | ICD-10-CM | POA: Diagnosis present

## 2013-06-04 DIAGNOSIS — E876 Hypokalemia: Secondary | ICD-10-CM | POA: Diagnosis present

## 2013-06-04 DIAGNOSIS — Z7901 Long term (current) use of anticoagulants: Secondary | ICD-10-CM | POA: Diagnosis not present

## 2013-06-04 DIAGNOSIS — I4891 Unspecified atrial fibrillation: Secondary | ICD-10-CM | POA: Diagnosis present

## 2013-06-04 DIAGNOSIS — E7800 Pure hypercholesterolemia, unspecified: Secondary | ICD-10-CM | POA: Diagnosis present

## 2013-06-04 DIAGNOSIS — I2510 Atherosclerotic heart disease of native coronary artery without angina pectoris: Secondary | ICD-10-CM | POA: Diagnosis not present

## 2013-06-04 DIAGNOSIS — E860 Dehydration: Secondary | ICD-10-CM | POA: Diagnosis not present

## 2013-06-04 DIAGNOSIS — I10 Essential (primary) hypertension: Secondary | ICD-10-CM | POA: Diagnosis present

## 2013-06-04 DIAGNOSIS — I252 Old myocardial infarction: Secondary | ICD-10-CM | POA: Diagnosis not present

## 2013-06-04 DIAGNOSIS — N179 Acute kidney failure, unspecified: Secondary | ICD-10-CM | POA: Diagnosis not present

## 2013-06-04 DIAGNOSIS — Z7982 Long term (current) use of aspirin: Secondary | ICD-10-CM | POA: Diagnosis not present

## 2013-06-04 DIAGNOSIS — Z23 Encounter for immunization: Secondary | ICD-10-CM | POA: Diagnosis not present

## 2013-06-05 DIAGNOSIS — R197 Diarrhea, unspecified: Secondary | ICD-10-CM | POA: Diagnosis not present

## 2013-06-05 DIAGNOSIS — I2510 Atherosclerotic heart disease of native coronary artery without angina pectoris: Secondary | ICD-10-CM | POA: Diagnosis not present

## 2013-06-05 DIAGNOSIS — N179 Acute kidney failure, unspecified: Secondary | ICD-10-CM | POA: Diagnosis not present

## 2013-06-05 DIAGNOSIS — E860 Dehydration: Secondary | ICD-10-CM | POA: Diagnosis not present

## 2013-06-06 DIAGNOSIS — E860 Dehydration: Secondary | ICD-10-CM | POA: Diagnosis not present

## 2013-06-06 DIAGNOSIS — I2510 Atherosclerotic heart disease of native coronary artery without angina pectoris: Secondary | ICD-10-CM | POA: Diagnosis not present

## 2013-06-06 DIAGNOSIS — R197 Diarrhea, unspecified: Secondary | ICD-10-CM | POA: Diagnosis not present

## 2013-06-06 DIAGNOSIS — N179 Acute kidney failure, unspecified: Secondary | ICD-10-CM | POA: Diagnosis not present

## 2013-06-08 ENCOUNTER — Ambulatory Visit (INDEPENDENT_AMBULATORY_CARE_PROVIDER_SITE_OTHER): Payer: Medicare Other | Admitting: Cardiology

## 2013-06-08 ENCOUNTER — Ambulatory Visit (INDEPENDENT_AMBULATORY_CARE_PROVIDER_SITE_OTHER): Payer: Medicare Other | Admitting: Pharmacist Clinician (PhC)/ Clinical Pharmacy Specialist

## 2013-06-08 ENCOUNTER — Encounter: Payer: Self-pay | Admitting: Cardiology

## 2013-06-08 VITALS — BP 150/60 | Ht 67.0 in | Wt 207.7 lb

## 2013-06-08 DIAGNOSIS — I2510 Atherosclerotic heart disease of native coronary artery without angina pectoris: Secondary | ICD-10-CM

## 2013-06-08 DIAGNOSIS — I480 Paroxysmal atrial fibrillation: Secondary | ICD-10-CM

## 2013-06-08 DIAGNOSIS — R519 Headache, unspecified: Secondary | ICD-10-CM

## 2013-06-08 DIAGNOSIS — Z7901 Long term (current) use of anticoagulants: Secondary | ICD-10-CM

## 2013-06-08 DIAGNOSIS — I4891 Unspecified atrial fibrillation: Secondary | ICD-10-CM

## 2013-06-08 DIAGNOSIS — E785 Hyperlipidemia, unspecified: Secondary | ICD-10-CM | POA: Diagnosis not present

## 2013-06-08 DIAGNOSIS — R51 Headache: Secondary | ICD-10-CM | POA: Diagnosis not present

## 2013-06-08 DIAGNOSIS — J449 Chronic obstructive pulmonary disease, unspecified: Secondary | ICD-10-CM

## 2013-06-08 LAB — POCT INR: INR: 1.8

## 2013-06-08 NOTE — Assessment & Plan Note (Signed)
Holding NSR 

## 2013-06-08 NOTE — Assessment & Plan Note (Signed)
No further angina 

## 2013-06-08 NOTE — Progress Notes (Signed)
06/08/2013 Gavin [name]   [birth year]  [id number]  Primary Physicia Lucila Maine, MD Primary Cardiologist: Dr Allyson Sabal  HPI:  79 year-old white married male presentted to the ER  with 05/30/13 chest pain that awakened him from sleep. He has a history of coronary disease with last cardiac catheterization July 2010 revealing an occluded LAD stent otherwise normal coronary arteries. At that time his EF was 30%, subsequent 2-D echo in 2013 revealed EF is normal.  He had a stress test in 2012 with mod to severe perfusion defect, maybe slightly worse than previous study.At that time the pt was without symptoms so no further work up was done. He also has a history of PAF and is on Coumadin for anticoagulation. He has been maintaining sinus rhythm. He has peripheral vascular disease with stenting by Dr. Allyson Sabal to both SFAs in 2004 with 0 vessel runoff on the right and one on the left via peroneal. he also has bilateral carotid disease left greater than right. Other problems include hypertension hyperlipidemia, and obstructive sleep apnea on CPAP. He has chronic headache with neg. head CT in June, and recently per our office stopped his IMDUR to see if Headache would resolve. It did not. He resumed the Imdur 05/29/13. He was admitted 05/29/13 with recurrent chest pain worrisome for unstable angina. His Coumadin was held. His Troponin was negative. He underwent coronary angiogram 06/01/13. This revealed moderate RCA, ostial CFX, and distal Dx1 disease with know distal LAD occlusion. There was no obvious culprit lesion and the plan is for medical Rx. Norvasc and Ranexa were added and his Imdur was decreased. He is seen today for follow up. Since discharge he has been without chest pain. He still complains of a headache, "no change".     Current Outpatient Prescriptions  Medication Sig Dispense Refill  . acetaminophen (TYLENOL) 325 MG tablet Take 2 tablets (650 mg total) by mouth every 4 (four) hours as needed.      Marland Kitchen  amitriptyline (ELAVIL) 10 MG tablet Take 10 mg by mouth every evening.      Marland Kitchen amLODipine (NORVASC) 10 MG tablet Take 1 tablet (10 mg total) by mouth daily.  30 tablet  5  . aspirin 81 MG tablet Take 81 mg by mouth daily.      Marland Kitchen atorvastatin (LIPITOR) 40 MG tablet Take 40 mg by mouth every evening.      . clorazepate (TRANXENE) 7.5 MG tablet Take 7.5 mg by mouth 2 (two) times daily as needed for anxiety.       . diclofenac sodium (VOLTAREN) 1 % GEL Apply 2 g topically daily as needed (pain).      . enalapril (VASOTEC) 20 MG tablet Take 20 mg by mouth every evening.      Marland Kitchen Fesoterodine Fumarate (TOVIAZ) 8 MG TB24 Take 8 mg by mouth daily.      . furosemide (LASIX) 40 MG tablet Take 40 mg by mouth daily.      . isosorbide mononitrate (IMDUR) 30 MG 24 hr tablet Take 1 tablet (30 mg total) by mouth 2 (two) times daily. Take 1 tablet by mouth every morning and 1 tablet by mouth every evening  60 tablet  5  . KLOR-CON M10 10 MEQ tablet Take 10 mEq by mouth every other day.       . loratadine (CLARITIN) 10 MG tablet Take 10 mg by mouth daily.        . metoprolol succinate (TOPROL-XL) 50 MG 24 hr tablet Take  50 mg by mouth 2 (two) times daily. Take with or immediately following a meal.      . Misc Natural Products (LUTEIN VISION BLEND PO) Take 1 tablet by mouth 2 (two) times daily.      . Multiple Vitamin (MULTIVITAMIN) tablet Take 1 tablet by mouth daily.      Marland Kitchen NASONEX 50 MCG/ACT nasal spray Place 2 sprays into the nose 2 (two) times daily.       . nitroGLYCERIN (NITROSTAT) 0.4 MG SL tablet Place 0.4 mg under the tongue every 5 (five) minutes as needed.      . pantoprazole (PROTONIX) 40 MG tablet Take 40 mg by mouth daily.       . ranolazine (RANEXA) 500 MG 12 hr tablet Take 1 tablet (500 mg total) by mouth 2 (two) times daily.  60 tablet  5  . sertraline (ZOLOFT) 50 MG tablet Take 50 mg by mouth every morning.      . vitamin B-12 (CYANOCOBALAMIN) 1000 MCG tablet Take 1,000 mcg by mouth daily.      Marland Kitchen  warfarin (COUMADIN) 5 MG tablet Take 2.5-5 mg by mouth daily. Takes 1/2 tablet Monday and Friday      . zaleplon (SONATA) 10 MG capsule Take 10 mg by mouth at bedtime as needed (sleep).      . metoprolol (LOPRESSOR) 50 MG tablet        No current facility-administered medications for this visit.    Allergies  Allergen Reactions  . Ciprofloxacin     unknown  . Codeine     "knocked me out"  . Dexamethasone     unknown  . Sulfonamide Derivatives     Unknown reaction  . Telmisartan     unknown    History   Social History  . Marital Status: Married    Spouse Name: N/A    Number of Children: N/A  . Years of Education: N/A   Occupational History  . 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 Smoking status: Former Smoker    Types: Cigarettes    Quit date: 09/23/1973  . Smokeless tobacco: Never Used     Comment: tried snuff and chew as a child (28yr) did not continue  . Alcohol Use: No     Comment: seldom-- every three or four years  . Drug Use: No  . Sexual Activity: Not on file   Other Topics Concern  . Not on file   Social History Narrative  . No narrative on file     Review of Systems: General: negative for chills, fever, night sweats or weight changes.  Cardiovascular: negative for chest pain, dyspnea on exertion, edema, orthopnea, palpitations, paroxysmal nocturnal dyspnea or shortness of breath Dermatological: negative for rash Respiratory: negative for cough or wheezing Urologic: negative for hematuria Abdominal: negative for nausea, vomiting, diarrhea, bright red blood per rectum, melena, or hematemesis Neurologic: negative for visual changes, syncope, or dizziness All other systems reviewed and are otherwise negative except as noted above.    Blood pressure 150/60, height 5\' 7"  (1.702 m), weight 207 lb 11.2 oz (94.212 kg).  General appearance: alert, cooperative and no distress Neck: no JVD and LCA bruit Lungs: clear to  auscultation bilaterally Heart: regular rate and rhythm Rt wrist ecchymotic, good pulse  EKG NSR  ASSESSMENT AND PLAN:   CAD, Multiple PCIs to  LAD, as well as RCA and Dx with ultimate LAD occlusion Jan 2010.   Cath 06/01/13- medical  Rx No further angina  PAF (paroxysmal atrial fibrillation) Holding NSR  Headache, chronic, no change off Imdur He has been to the headache clinic, this is a chronic problem that was unchanged off Imdur.  Hyperlipidemia LDL goal < 70 LDL 48 during his recent admission.  COPD Followed by Dr Maple Hudson    PLAN  No change in his medications. He will follow up with Dr Allyson Sabal in 3 months.  Kieu Quiggle KPA-C 06/08/2013 3:01 PM

## 2013-06-08 NOTE — Assessment & Plan Note (Signed)
LDL 48 during his recent admission.

## 2013-06-08 NOTE — Assessment & Plan Note (Signed)
He has been to the headache clinic, this is a chronic problem that was unchanged off Imdur.

## 2013-06-08 NOTE — Assessment & Plan Note (Signed)
Followed by Dr. Young. 

## 2013-06-10 ENCOUNTER — Ambulatory Visit: Payer: Medicare Other | Admitting: Pharmacist Clinician (PhC)/ Clinical Pharmacy Specialist

## 2013-06-10 DIAGNOSIS — R42 Dizziness and giddiness: Secondary | ICD-10-CM | POA: Diagnosis not present

## 2013-06-10 DIAGNOSIS — R296 Repeated falls: Secondary | ICD-10-CM | POA: Diagnosis not present

## 2013-06-10 DIAGNOSIS — I4891 Unspecified atrial fibrillation: Secondary | ICD-10-CM | POA: Diagnosis not present

## 2013-06-10 DIAGNOSIS — S0100XA Unspecified open wound of scalp, initial encounter: Secondary | ICD-10-CM | POA: Diagnosis not present

## 2013-06-10 DIAGNOSIS — I2510 Atherosclerotic heart disease of native coronary artery without angina pectoris: Secondary | ICD-10-CM | POA: Diagnosis not present

## 2013-06-10 DIAGNOSIS — I509 Heart failure, unspecified: Secondary | ICD-10-CM | POA: Diagnosis not present

## 2013-06-10 DIAGNOSIS — Z7901 Long term (current) use of anticoagulants: Secondary | ICD-10-CM | POA: Diagnosis not present

## 2013-06-10 DIAGNOSIS — Z79899 Other long term (current) drug therapy: Secondary | ICD-10-CM | POA: Diagnosis not present

## 2013-06-11 DIAGNOSIS — N179 Acute kidney failure, unspecified: Secondary | ICD-10-CM | POA: Diagnosis not present

## 2013-06-11 DIAGNOSIS — Z23 Encounter for immunization: Secondary | ICD-10-CM | POA: Diagnosis not present

## 2013-06-12 DIAGNOSIS — Z203 Contact with and (suspected) exposure to rabies: Secondary | ICD-10-CM | POA: Diagnosis not present

## 2013-06-12 DIAGNOSIS — Z23 Encounter for immunization: Secondary | ICD-10-CM | POA: Diagnosis not present

## 2013-06-15 DIAGNOSIS — B351 Tinea unguium: Secondary | ICD-10-CM | POA: Diagnosis not present

## 2013-06-15 DIAGNOSIS — I70209 Unspecified atherosclerosis of native arteries of extremities, unspecified extremity: Secondary | ICD-10-CM | POA: Diagnosis not present

## 2013-06-22 ENCOUNTER — Ambulatory Visit (INDEPENDENT_AMBULATORY_CARE_PROVIDER_SITE_OTHER): Payer: Medicare Other | Admitting: Pharmacist Clinician (PhC)/ Clinical Pharmacy Specialist

## 2013-06-22 VITALS — BP 120/56 | HR 80

## 2013-06-22 DIAGNOSIS — Z7901 Long term (current) use of anticoagulants: Secondary | ICD-10-CM | POA: Diagnosis not present

## 2013-06-22 DIAGNOSIS — I480 Paroxysmal atrial fibrillation: Secondary | ICD-10-CM

## 2013-06-22 DIAGNOSIS — I4891 Unspecified atrial fibrillation: Secondary | ICD-10-CM | POA: Diagnosis not present

## 2013-06-22 LAB — POCT INR: INR: 4

## 2013-06-25 ENCOUNTER — Ambulatory Visit: Payer: Medicare Other | Admitting: Cardiovascular Disease

## 2013-06-27 DIAGNOSIS — S0990XA Unspecified injury of head, initial encounter: Secondary | ICD-10-CM | POA: Diagnosis not present

## 2013-06-27 DIAGNOSIS — S298XXA Other specified injuries of thorax, initial encounter: Secondary | ICD-10-CM | POA: Diagnosis not present

## 2013-06-27 DIAGNOSIS — I2510 Atherosclerotic heart disease of native coronary artery without angina pectoris: Secondary | ICD-10-CM | POA: Diagnosis not present

## 2013-06-27 DIAGNOSIS — W010XXA Fall on same level from slipping, tripping and stumbling without subsequent striking against object, initial encounter: Secondary | ICD-10-CM | POA: Diagnosis not present

## 2013-06-27 DIAGNOSIS — I10 Essential (primary) hypertension: Secondary | ICD-10-CM | POA: Diagnosis not present

## 2013-06-27 DIAGNOSIS — F0390 Unspecified dementia without behavioral disturbance: Secondary | ICD-10-CM | POA: Diagnosis not present

## 2013-06-27 DIAGNOSIS — E7800 Pure hypercholesterolemia, unspecified: Secondary | ICD-10-CM | POA: Diagnosis not present

## 2013-06-27 DIAGNOSIS — R443 Hallucinations, unspecified: Secondary | ICD-10-CM | POA: Diagnosis not present

## 2013-06-27 DIAGNOSIS — Z043 Encounter for examination and observation following other accident: Secondary | ICD-10-CM | POA: Diagnosis not present

## 2013-06-27 DIAGNOSIS — I4891 Unspecified atrial fibrillation: Secondary | ICD-10-CM | POA: Diagnosis not present

## 2013-06-27 DIAGNOSIS — K219 Gastro-esophageal reflux disease without esophagitis: Secondary | ICD-10-CM | POA: Diagnosis not present

## 2013-06-27 DIAGNOSIS — I509 Heart failure, unspecified: Secondary | ICD-10-CM | POA: Diagnosis not present

## 2013-06-29 ENCOUNTER — Ambulatory Visit (INDEPENDENT_AMBULATORY_CARE_PROVIDER_SITE_OTHER): Payer: Medicare Other | Admitting: Cardiovascular Disease

## 2013-06-29 ENCOUNTER — Encounter: Payer: Self-pay | Admitting: Cardiovascular Disease

## 2013-06-29 ENCOUNTER — Ambulatory Visit (INDEPENDENT_AMBULATORY_CARE_PROVIDER_SITE_OTHER): Payer: Medicare Other | Admitting: Pharmacist Clinician (PhC)/ Clinical Pharmacy Specialist

## 2013-06-29 VITALS — BP 140/80 | HR 63 | Ht 67.0 in | Wt 204.7 lb

## 2013-06-29 DIAGNOSIS — I480 Paroxysmal atrial fibrillation: Secondary | ICD-10-CM

## 2013-06-29 DIAGNOSIS — Z7901 Long term (current) use of anticoagulants: Secondary | ICD-10-CM

## 2013-06-29 DIAGNOSIS — I2510 Atherosclerotic heart disease of native coronary artery without angina pectoris: Secondary | ICD-10-CM | POA: Diagnosis not present

## 2013-06-29 DIAGNOSIS — I4891 Unspecified atrial fibrillation: Secondary | ICD-10-CM

## 2013-06-29 LAB — POCT INR: INR: 3.8

## 2013-06-29 NOTE — Assessment & Plan Note (Signed)
Currently maintaining sinus rhythm on Coumadin anticoagulation. The patient has had several falls including one that resulted in trauma to his head. Based on this I elected to discontinue his Coumadin because of the fall risk.

## 2013-06-29 NOTE — Progress Notes (Signed)
06/29/2013 Gavin [name]   08-18-1929  [id number]  Primary Physician Gavin Maine, MD Primary Cardiologist: Gavin Gess MD Gavin [name]   HPI:  79 year-old white married male presentted to the ER with 05/30/13 chest pain that awakened him from sleep. He has a history of coronary disease with last cardiac catheterization July 2010 revealing an occluded LAD stent otherwise normal coronary arteries. At that time his EF was 30%, subsequent 2-D echo in 2013 revealed EF is normal. He had a stress test in 2012 with mod to severe perfusion defect, maybe slightly worse than previous study.At that time the pt was without symptoms so no further work up was done. He also has a history of PAF and is on Coumadin for anticoagulation. He has been maintaining sinus rhythm. He has peripheral vascular disease with stenting by Dr. Allyson [name] to both SFAs in 2004 with 0 vessel runoff on the right and one on the left via peroneal. he also has bilateral carotid disease left greater than right. Other problems include hypertension hyperlipidemia, and obstructive sleep apnea on CPAP. He has chronic headache with neg. head CT in June, and recently per our office stopped his IMDUR to see if Headache would resolve. It did not. He resumed the Imdur 05/29/13. He was admitted 05/29/13 with recurrent chest pain worrisome for unstable angina. His Coumadin was held. His Troponin was negative. He underwent coronary angiogram 06/01/13. This revealed moderate RCA, ostial CFX, and distal Dx1 disease with know distal LAD occlusion. There was no obvious culprit lesion and the plan is for medical Rx. Norvasc and Ranexa were added and his Imdur was decreased.he was seen by [name] in the office 06/08/13 for post consultation followup. He was pain-free. I last saw him in the office earlier this year. He has paroxysmal atrial fibrillation on Coumadin anticoagulation. He has fallen several times one involving head trauma.  Current  Outpatient Prescriptions  Medication Sig Dispense Refill  . acetaminophen (TYLENOL) 325 MG tablet Take 2 tablets (650 mg total) by mouth every 4 (four) hours as needed.      Marland Kitchen amitriptyline (ELAVIL) 10 MG tablet Take 10 mg by mouth every evening.      Marland Kitchen amLODipine (NORVASC) 10 MG tablet Take 1 tablet (10 mg total) by mouth daily.  30 tablet  5  . aspirin 81 MG tablet Take 81 mg by mouth daily.      Marland Kitchen atorvastatin (LIPITOR) 40 MG tablet Take 40 mg by mouth every evening.      . clorazepate (TRANXENE) 7.5 MG tablet Take 7.5 mg by mouth 2 (two) times daily as needed for anxiety.       . diclofenac sodium (VOLTAREN) 1 % GEL Apply 2 g topically daily as needed (pain).      . enalapril (VASOTEC) 20 MG tablet Take 20 mg by mouth every evening.      Marland Kitchen Fesoterodine Fumarate (TOVIAZ) 8 MG TB24 Take 8 mg by mouth daily.      . furosemide (LASIX) 40 MG tablet Take 40 mg by mouth daily.      . isosorbide mononitrate (IMDUR) 30 MG 24 hr tablet Take 30 mg by mouth 2 (two) times daily.      Marland Kitchen KLOR-CON M10 10 MEQ tablet Take 10 mEq by mouth every other day.       . loratadine (CLARITIN) 10 MG tablet Take 10 mg by mouth daily.        . metoprolol (LOPRESSOR) 50  MG tablet Take 50 mg by mouth 2 (two) times daily.       . Misc Natural Products (LUTEIN VISION BLEND PO) Take 1 tablet by mouth 2 (two) times daily.      . Multiple Vitamin (MULTIVITAMIN) tablet Take 1 tablet by mouth daily.      Marland Kitchen NASONEX 50 MCG/ACT nasal spray Place 2 sprays into the nose 2 (two) times daily.       . nitroGLYCERIN (NITROSTAT) 0.4 MG SL tablet Place 0.4 mg under the tongue every 5 (five) minutes as needed.      . pantoprazole (PROTONIX) 40 MG tablet Take 40 mg by mouth daily.       . ranolazine (RANEXA) 500 MG 12 hr tablet Take 1 tablet (500 mg total) by mouth 2 (two) times daily.  60 tablet  5  . sertraline (ZOLOFT) 50 MG tablet Take 50 mg by mouth every morning.      . vitamin B-12 (CYANOCOBALAMIN) 1000 MCG tablet Take 1,000 mcg by  mouth daily.      Marland Kitchen warfarin (COUMADIN) 5 MG tablet Take 2.5-5 mg by mouth daily. Takes 1/2 tablet Monday and Friday      . zaleplon (SONATA) 10 MG capsule Take 10 mg by mouth at bedtime as needed (sleep).       No current facility-administered medications for this visit.    Allergies  Allergen Reactions  . Ciprofloxacin     unknown  . Codeine     "knocked me out"  . Dexamethasone     unknown  . Sulfonamide Derivatives     Unknown reaction  . Telmisartan     unknown    History   Social History  . Marital Status: Married    Spouse Name: N/A    Number of Children: N/A  . Years of Education: N/A   Occupational History  . 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 Smoking status: Former Smoker    Types: Cigarettes    Quit date: 09/23/1973  . Smokeless tobacco: Never Used     Comment: tried snuff and chew as a child (84yr) did not continue  . Alcohol Use: No     Comment: seldom-- every three or four years  . Drug Use: No  . Sexual Activity: Not on file   Other Topics Concern  . Not on file   Social History Narrative  . No narrative on file     Review of Systems: General: negative for chills, fever, night sweats or weight changes.  Cardiovascular: negative for chest pain, dyspnea on exertion, edema, orthopnea, palpitations, paroxysmal nocturnal dyspnea or shortness of breath Dermatological: negative for rash Respiratory: negative for cough or wheezing Urologic: negative for hematuria Abdominal: negative for nausea, vomiting, diarrhea, bright red blood per rectum, melena, or hematemesis Neurologic: negative for visual changes, syncope, or dizziness All other systems reviewed and are otherwise negative except as noted above.    Blood pressure 140/80, pulse 63, height 5\' 7"  (1.702 m), weight 204 lb 11.2 oz (92.851 kg).  General appearance: alert and no distress Neck: no adenopathy, no carotid bruit, no JVD, supple, symmetrical,  trachea midline and thyroid not enlarged, symmetric, no tenderness/mass/nodules Lungs: clear to auscultation bilaterally Heart: regular rate and rhythm, S1, S2 normal, no murmur, click, rub or gallop Extremities: extremities normal, atraumatic, no cyanosis or edema  EKG normal sinus rhythm at 63 without ST or T wave changes  ASSESSMENT AND PLAN:   PAF (paroxysmal  atrial fibrillation) Currently maintaining sinus rhythm on Coumadin anticoagulation. The patient has had several falls including one that resulted in trauma to his head. Based on this I elected to discontinue his Coumadin because of the fall risk.  CAD, Multiple PCIs to  LAD, as well as RCA and Dx with ultimate LAD occlusion Jan 2010.   Cath 06/01/13- medical Rx Known CAD status post recent admission for chest pain rule out myocardial infarction. The patient underwent cardiac catheterization by Dr. Herbie Baltimore revealing "no culprit lesion". His medications were adjusted. He currently is pain-free      Gavin Gess MD FACP,FACC,FAHA, [facility] 06/29/2013 11:40 AM

## 2013-06-29 NOTE — Patient Instructions (Signed)
Your physician has recommended you make the following change in your medication: STOP the warfarin.  Your physician recommends that you schedule a follow-up appointment in: 6 MONTHS with a extender and 1 YEAR With Dr. Allyson Sabal

## 2013-06-29 NOTE — Assessment & Plan Note (Signed)
Known CAD status post recent admission for chest pain rule out myocardial infarction. The patient underwent cardiac catheterization by Dr. Herbie Baltimore revealing "no culprit lesion". His medications were adjusted. He currently is pain-free

## 2013-06-30 ENCOUNTER — Encounter: Payer: Self-pay | Admitting: Cardiovascular Disease

## 2013-07-05 ENCOUNTER — Telehealth: Payer: Self-pay | Admitting: Cardiovascular Disease

## 2013-07-06 ENCOUNTER — Ambulatory Visit: Payer: Medicare Other | Admitting: Pharmacist Clinician (PhC)/ Clinical Pharmacy Specialist

## 2013-07-09 DIAGNOSIS — I9589 Other hypotension: Secondary | ICD-10-CM | POA: Diagnosis not present

## 2013-07-14 DIAGNOSIS — I9589 Other hypotension: Secondary | ICD-10-CM | POA: Diagnosis not present

## 2013-07-14 DIAGNOSIS — Z09 Encounter for follow-up examination after completed treatment for conditions other than malignant neoplasm: Secondary | ICD-10-CM | POA: Diagnosis not present

## 2013-07-16 ENCOUNTER — Other Ambulatory Visit: Payer: Self-pay | Admitting: Cardiovascular Disease

## 2013-07-16 NOTE — Telephone Encounter (Signed)
Rx was sent to pharmacy electronically. 

## 2013-07-19 ENCOUNTER — Other Ambulatory Visit: Payer: Self-pay | Admitting: *Deleted

## 2013-07-25 ENCOUNTER — Telehealth: Payer: Self-pay | Admitting: Internal Medicine

## 2013-07-25 NOTE — Telephone Encounter (Signed)
CP, first episode last night.  Took maalox and it helped.  About one hour ago started having chest pain again, similar to when he was having chest pain prior to Imdur increase to 90mg . Currently on imdur 60mg .  Fleeting pain, 0/10 currently.  No SOB, diaphoresis.  No lightheadedness.  Recommended he take an additional 30mg  Imdur this evening.  To present to ER if pain persists.

## 2013-07-28 DIAGNOSIS — Z09 Encounter for follow-up examination after completed treatment for conditions other than malignant neoplasm: Secondary | ICD-10-CM | POA: Diagnosis not present

## 2013-07-28 DIAGNOSIS — Z79899 Other long term (current) drug therapy: Secondary | ICD-10-CM | POA: Diagnosis not present

## 2013-07-28 DIAGNOSIS — I9589 Other hypotension: Secondary | ICD-10-CM | POA: Diagnosis not present

## 2013-07-29 ENCOUNTER — Other Ambulatory Visit: Payer: Self-pay

## 2013-08-06 DIAGNOSIS — R51 Headache: Secondary | ICD-10-CM | POA: Diagnosis not present

## 2013-08-06 DIAGNOSIS — R413 Other amnesia: Secondary | ICD-10-CM | POA: Diagnosis not present

## 2013-08-10 ENCOUNTER — Other Ambulatory Visit: Payer: Self-pay | Admitting: *Deleted

## 2013-08-10 MED ORDER — AMLODIPINE BESYLATE 2.5 MG PO TABS: 7.5000 mg | ORAL_TABLET | Freq: Every day | ORAL | Status: DC

## 2013-08-20 DIAGNOSIS — J0100 Acute maxillary sinusitis, unspecified: Secondary | ICD-10-CM | POA: Diagnosis not present

## 2013-08-23 ENCOUNTER — Other Ambulatory Visit: Payer: Self-pay | Admitting: *Deleted

## 2013-08-23 MED ORDER — POTASSIUM CHLORIDE CRYS ER 10 MEQ PO TBCR: 10.0000 meq | EXTENDED_RELEASE_TABLET | ORAL | Status: DC

## 2013-08-23 NOTE — Telephone Encounter (Signed)
Rx was sent to pharmacy electronically. 

## 2013-09-01 DIAGNOSIS — R42 Dizziness and giddiness: Secondary | ICD-10-CM | POA: Diagnosis not present

## 2013-09-10 ENCOUNTER — Ambulatory Visit: Payer: Medicare Other | Admitting: Cardiovascular Disease

## 2013-09-10 ENCOUNTER — Encounter: Payer: Self-pay | Admitting: Internal Medicine

## 2013-09-10 ENCOUNTER — Ambulatory Visit (INDEPENDENT_AMBULATORY_CARE_PROVIDER_SITE_OTHER): Payer: Medicare Other | Admitting: Internal Medicine

## 2013-09-10 ENCOUNTER — Encounter: Payer: Self-pay | Admitting: Cardiovascular Disease

## 2013-09-10 ENCOUNTER — Ambulatory Visit (INDEPENDENT_AMBULATORY_CARE_PROVIDER_SITE_OTHER): Payer: Medicare Other | Admitting: Cardiovascular Disease

## 2013-09-10 VITALS — BP 120/70 | HR 62 | Ht 67.0 in | Wt 202.0 lb

## 2013-09-10 VITALS — BP 140/68 | HR 67 | Ht 67.0 in | Wt 201.8 lb

## 2013-09-10 DIAGNOSIS — I200 Unstable angina: Secondary | ICD-10-CM | POA: Diagnosis not present

## 2013-09-10 DIAGNOSIS — I6529 Occlusion and stenosis of unspecified carotid artery: Secondary | ICD-10-CM | POA: Diagnosis not present

## 2013-09-10 DIAGNOSIS — E785 Hyperlipidemia, unspecified: Secondary | ICD-10-CM

## 2013-09-10 DIAGNOSIS — I739 Peripheral vascular disease, unspecified: Secondary | ICD-10-CM | POA: Diagnosis not present

## 2013-09-10 DIAGNOSIS — I779 Disorder of arteries and arterioles, unspecified: Secondary | ICD-10-CM

## 2013-09-10 DIAGNOSIS — I4891 Unspecified atrial fibrillation: Secondary | ICD-10-CM

## 2013-09-10 DIAGNOSIS — I2510 Atherosclerotic heart disease of native coronary artery without angina pectoris: Secondary | ICD-10-CM

## 2013-09-10 DIAGNOSIS — G4733 Obstructive sleep apnea (adult) (pediatric): Secondary | ICD-10-CM | POA: Diagnosis not present

## 2013-09-10 DIAGNOSIS — I480 Paroxysmal atrial fibrillation: Secondary | ICD-10-CM

## 2013-09-10 NOTE — Assessment & Plan Note (Signed)
Status post bilateral SFA stenting in the past. He has 0 vessel runoff on the right and one vessel runoff on the left. We follow this by duplex ultrasound. He really denies claudication. He walks with a cane.

## 2013-09-10 NOTE — Assessment & Plan Note (Signed)
On Coumadin anticoagulation currently in sinus rhythm

## 2013-09-10 NOTE — Patient Instructions (Signed)
Suggest you talk with Dr Lorin Picket about trying off Elavil and Lopressor to see if the daytime sleepiness and the visual hallucinations "apparitions" improve off these meds.   Order- DME [facility]- titrate CPAP 5-15 x 1 week for pressure recommendation.  Dx OSA

## 2013-09-10 NOTE — Assessment & Plan Note (Signed)
Followed by duplex ultrasound as recently as 04/01/13 revealing moderate right, and moderately severe at left ICA stenosis. He is neurologically asymptomatic. Will follow [place] again next month.

## 2013-09-10 NOTE — Assessment & Plan Note (Signed)
History of known CAD. He's had a stent to his LAD remotely with occlusion of the LAD stent documented angiographically. His EF has normalized by 2-D echocardiography. He currently denies chest pain or shortness of breath.

## 2013-09-10 NOTE — Assessment & Plan Note (Signed)
On statin therapy with recent lipid profile performed 05/31/13 revealing a total cholesterol 108, LDL of 48 and HDL of 37

## 2013-09-10 NOTE — Progress Notes (Signed)
09/10/2013 LAMON ROTUNDO   05/31/1929  [id number]  Primary Physician Lucila Maine, MD Primary Cardiologist: Runell Gess MD [name]   HPI:  79 year-old white married male presentted to the ER with 05/30/13 chest pain that awakened him from sleep. He has a history of coronary disease with last cardiac catheterization July 2010 revealing an occluded LAD stent otherwise normal coronary arteries. At that time his EF was 30%, subsequent 2-D echo in 2013 revealed EF is normal. He had a stress test in 2012 with mod to severe perfusion defect, maybe slightly worse than previous study.At that time the pt was without symptoms so no further work up was done. He also has a history of PAF and is on Coumadin for anticoagulation. He has been maintaining sinus rhythm. He has peripheral vascular disease with stenting by Dr. Allyson Sabal to both SFAs in 2004 with 0 vessel runoff on the right and one on the left via peroneal. he also has bilateral carotid disease left greater than right. Other problems include hypertension hyperlipidemia, and obstructive sleep apnea on CPAP. He has chronic headache with neg. head CT in June, and recently per our office stopped his IMDUR to see if Headache would resolve. It did not. He resumed the Imdur 05/29/13. He was admitted 05/29/13 with recurrent chest pain worrisome for unstable angina. His Coumadin was held. His Troponin was negative. He underwent coronary angiogram 06/01/13. This revealed moderate RCA, ostial CFX, and distal Dx1 disease with know distal LAD occlusion. There was no obvious culprit lesion and the plan is for medical Rx. Norvasc and Ranexa were added and his Imdur was decreased.he was seen by Corine Shelter [facility] in the office 06/08/13 for post consultation followup. He was pain-free. I last saw him in the office earlier this year. He has paroxysmal atrial fibrillation on Coumadin anticoagulation. He has fallen several times one involving head trauma.  Since I saw  him 2 months ago he denies chest pain or shortness of breath. He walked with a cane. He has not fallen since I saw him last.    Current Outpatient Prescriptions  Medication Sig Dispense Refill  . acetaminophen (TYLENOL) 325 MG tablet Take 2 tablets (650 mg total) by mouth every 4 (four) hours as needed.      Marland Kitchen amitriptyline (ELAVIL) 10 MG tablet Take 10 mg by mouth every evening.      Marland Kitchen amLODipine (NORVASC) 2.5 MG tablet Take 3 tablets (7.5 mg total) by mouth daily.  90 tablet  6  . aspirin 81 MG tablet Take 81 mg by mouth daily.      Marland Kitchen atorvastatin (LIPITOR) 40 MG tablet Take 40 mg by mouth every evening.      . diclofenac sodium (VOLTAREN) 1 % GEL Apply 2 g topically daily as needed (pain).      . enalapril (VASOTEC) 20 MG tablet Take 20 mg by mouth every evening.      Marland Kitchen Fesoterodine Fumarate (TOVIAZ) 8 MG TB24 Take 8 mg by mouth daily.      . furosemide (LASIX) 40 MG tablet Take 40 mg by mouth daily. Every other day      . isosorbide mononitrate (IMDUR) 30 MG 24 hr tablet Take 30 mg by mouth 2 (two) times daily.      Marland Kitchen loratadine (CLARITIN) 10 MG tablet Take 10 mg by mouth daily.        . metoprolol (LOPRESSOR) 50 MG tablet Take 50 mg by mouth 2 (two) times  daily.       . Misc Natural Products (LUTEIN VISION BLEND PO) Take 1 tablet by mouth 2 (two) times daily.      . Multiple Vitamin (MULTIVITAMIN) tablet Take 1 tablet by mouth daily.      Marland Kitchen NASONEX 50 MCG/ACT nasal spray Place 2 sprays into the nose 2 (two) times daily.       . nitroGLYCERIN (NITROSTAT) 0.4 MG SL tablet Place 0.4 mg under the tongue every 5 (five) minutes as needed.      . pantoprazole (PROTONIX) 40 MG tablet Take 1 tablet (40 mg total) by mouth daily.  30 tablet  6  . potassium chloride (KLOR-CON M10) 10 MEQ tablet Take 1 tablet (10 mEq total) by mouth every other day.  30 tablet  10  . ranolazine (RANEXA) 500 MG 12 hr tablet Take 1 tablet (500 mg total) by mouth 2 (two) times daily.  60 tablet  5  . sertraline  (ZOLOFT) 50 MG tablet Take 50 mg by mouth every morning.      . vitamin B-12 (CYANOCOBALAMIN) 1000 MCG tablet Take 1,000 mcg by mouth daily.      Marland Kitchen warfarin (COUMADIN) 5 MG tablet Take 2.5-5 mg by mouth daily. Takes 1/2 tablet Monday and Friday      . zaleplon (SONATA) 10 MG capsule Take 10 mg by mouth at bedtime as needed (sleep).       No current facility-administered medications for this visit.    Allergies  Allergen Reactions  . Ciprofloxacin     unknown  . Codeine     "knocked me out"  . Dexamethasone     unknown  . Sulfonamide Derivatives     Unknown reaction  . Telmisartan     unknown    History   Social History  . Marital Status: Married    Spouse Name: N/A    Number of Children: N/A  . Years of Education: N/A   Occupational History  . 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 Smoking status: Former Smoker    Types: Cigarettes    Quit date: 09/23/1973  . Smokeless tobacco: Never Used     Comment: tried snuff and chew as a child (88yr) did not continue  . Alcohol Use: No     Comment: seldom-- every three or four years  . Drug Use: No  . Sexual Activity: Not on file   Other Topics Concern  . Not on file   Social History Narrative  . No narrative on file     Review of Systems: General: negative for chills, fever, night sweats or weight changes.  Cardiovascular: negative for chest pain, dyspnea on exertion, edema, orthopnea, palpitations, paroxysmal nocturnal dyspnea or shortness of breath Dermatological: negative for rash Respiratory: negative for cough or wheezing Urologic: negative for hematuria Abdominal: negative for nausea, vomiting, diarrhea, bright red blood per rectum, melena, or hematemesis Neurologic: negative for visual changes, syncope, or dizziness All other systems reviewed and are otherwise negative except as noted above.    Blood pressure 140/68, pulse 67, height 5\' 7"  (1.702 m), weight 201 lb 12.8 oz  (91.536 kg).  General appearance: alert and no distress Neck: no adenopathy, no JVD, supple, symmetrical, trachea midline, thyroid not enlarged, symmetric, no tenderness/mass/nodules and bilateral carotid bruits Lungs: clear to auscultation bilaterally Heart: regular rate and rhythm, S1, S2 normal, no murmur, click, rub or gallop Extremities: extremities normal, atraumatic, no cyanosis or edema  EKG normal  sinus rhythm at 67 without ST or T wave changes  ASSESSMENT AND PLAN:   CAD, Multiple PCIs to  LAD, as well as RCA and Dx with ultimate LAD occlusion Jan 2010.   Cath 06/01/13- medical Rx History of known CAD. He's had a stent to his LAD remotely with occlusion of the LAD stent documented angiographically. His EF has normalized by 2-D echocardiography. He currently denies chest pain or shortness of breath.  PAD (peripheral artery disease), history of stenting to both SFAs Status post bilateral SFA stenting in the past. He has 0 vessel runoff on the right and one vessel runoff on the left. We follow this by duplex ultrasound. He really denies claudication. He walks with a cane.  PAF (paroxysmal atrial fibrillation) On Coumadin anticoagulation currently in sinus rhythm  Hyperlipidemia LDL goal < 70 On statin therapy with recent lipid profile performed 05/31/13 revealing a total cholesterol 108, LDL of 48 and HDL of 37  Carotid artery disease Followed by duplex ultrasound as recently as 04/01/13 revealing moderate right, and moderately severe at left ICA stenosis. He is neurologically asymptomatic. Will follow [place] again next month.      Runell Gess MD FACP,FACC,FAHA, [facility] 09/10/2013 12:48 PM

## 2013-09-10 NOTE — Patient Instructions (Signed)
  We will see you back in follow up in 6 months with Dr Allyson Sabal  Dr Allyson Sabal has ordered carotid dopplers to be done in Jan 2015

## 2013-09-10 NOTE — Progress Notes (Signed)
09/13/11 -78 year old male former smoker followed for obstructive sleep apnea, COPD, complicated by GERD, CAD, history of lung nodule LOV- 09/13/2010 PCP Dr Lucila Maine. Has had flu vaccine and shingles vaccine this year. Getting hyperbaric treatment for circulatory ischemia/wound right foot. He continues CPAP 9/[facility] using a nasal pillows mask. He is able to use this all night every night. We discussed his history of COPD-he quit smoking many years ago. He denies any wheeze, cough, or shortness of breath with his routine activities, recognizing that exertion is limited. I asked about his lung nodule. He had a CT scan at [place]. We will request that report.  09/11/12-79 year old male former smoker followed for obstructive sleep apnea, COPD, complicated by GERD, CAD, history of lung nodule  Wife is here FOLLOWS FOR: wears CPAP 9/ [facility] every night for about 6-7 hours each night and pressure working well for patient. No snoring through his mask. He continues cardiac rehabilitation 3 times per week and denies shortness of breath wheeze or cough. He is being evaluated for chronic left sided headaches. Workup for sinusitis was negative.  09/10/13- 79 year old male former smoker followed for obstructive sleep apnea, COPD, complicated by GERD, CAD, history of lung nodule  Wife is here FOLLOWS FOR: Breathing has been doing well. Currently using CPAP 9/ [facility]  machine every night. Has been falling asleep more often while sitting. Bedtime 11 PM to midnight, up between 9 and 10 AM. Sleeps through the night. Hallucinates about people and plants at night no sleepwalking and no kicking described.  ROS-see HPI Constitutional:   No-   weight loss, night sweats, fevers, chills, +fatigue, lassitude. HEENT:   + headaches, no- difficulty swallowing, tooth/dental problems, sore throat,       No-  sneezing, itching, ear ache, nasal congestion, post nasal drip,  CV:   No- acute  chest pain, orthopnea, PND, swelling in lower extremities, anasarca, dizziness, palpitations Resp: No- acute  shortness of breath with exertion or at rest.              No-   productive cough,  No non-productive cough,  No- coughing up of blood.              No-   change in color of mucus.  No- wheezing.   Skin: No-   rash or lesions. GI:  No-   heartburn, indigestion, abdominal pain, nausea, vomiting,  GU:  MS:  Neuro-     nothing unusual Psych:  No- change in mood or affect. No depression or anxiety.  No memory loss.  OBJ General- Alert, Oriented, Affect-appropriate, Distress- none acute Skin- rash-none, lesions- none, excoriation- none Lymphadenopathy- none Head- atraumatic            Eyes- Gross vision intact, PERRLA, conjunctivae clear secretions            Ears- Hearing, canals-normal            Nose- Clear, no-Septal dev, mucus, polyps, erosion, perforation             Throat- Mallampati II-III , mucosa clear , drainage- none, tonsils- atrophic Neck- flexible , trachea midline, no stridor , thyroid nl, carotid no bruit Chest - symmetrical excursion , unlabored           Heart/CV- RRR , no murmur , no gallop  , no rub, nl s1 s2                           -  JVD- none , edema- none, stasis changes- none, varices- none           Lung- very diminished, unlabored, , wheeze- none, cough- none , dullness-none, rub- none           Chest wall-  Abd-  Br/ Gen/ Rectal- Not done, not indicated Extrem- cyanosis- none, clubbing, none, atrophy- none, strength- nl,  Neuro- grossly intact to observation

## 2013-09-19 ENCOUNTER — Other Ambulatory Visit (HOSPITAL_COMMUNITY): Payer: Self-pay | Admitting: Pharmacist Clinician (PhC)/ Clinical Pharmacy Specialist

## 2013-09-20 NOTE — Telephone Encounter (Signed)
Rx was sent to pharmacy electronically. 

## 2013-09-23 HISTORY — PX: CATARACT EXTRACTION W/ INTRAOCULAR LENS  IMPLANT, BILATERAL: SHX1307

## 2013-10-01 ENCOUNTER — Telehealth: Payer: Self-pay | Admitting: Internal Medicine

## 2013-10-01 DIAGNOSIS — R51 Headache: Secondary | ICD-10-CM | POA: Diagnosis not present

## 2013-10-01 DIAGNOSIS — R413 Other amnesia: Secondary | ICD-10-CM | POA: Diagnosis not present

## 2013-10-01 NOTE — Telephone Encounter (Signed)
Received 3 pages from [employer], sent to Dr. Annamaria Boots. 10/01/13/ss.

## 2013-10-07 DIAGNOSIS — I70209 Unspecified atherosclerosis of native arteries of extremities, unspecified extremity: Secondary | ICD-10-CM | POA: Diagnosis not present

## 2013-10-07 DIAGNOSIS — B351 Tinea unguium: Secondary | ICD-10-CM | POA: Diagnosis not present

## 2013-10-09 NOTE — Assessment & Plan Note (Signed)
Compliant with CPAP and seems to be getting enough sleep at night except for  described hallucinations or vivid dreams. Not clear why he should feel sleepy during the day . Plan-CPAP titration/[facility]. Taper off Elavil which may be causing hallucinations.

## 2013-10-12 ENCOUNTER — Ambulatory Visit (HOSPITAL_COMMUNITY)
Admission: RE | Admit: 2013-10-12 | Discharge: 2013-10-12 | Disposition: A | Discharge status: Home / Self Care | Payer: Medicare Other | Source: Ambulatory Visit | Attending: Internal Medicine | Admitting: Internal Medicine

## 2013-10-12 DIAGNOSIS — I6529 Occlusion and stenosis of unspecified carotid artery: Secondary | ICD-10-CM | POA: Diagnosis not present

## 2013-10-12 DIAGNOSIS — I672 Cerebral atherosclerosis: Secondary | ICD-10-CM | POA: Diagnosis not present

## 2013-10-12 DIAGNOSIS — I2510 Atherosclerotic heart disease of native coronary artery without angina pectoris: Secondary | ICD-10-CM

## 2013-10-12 NOTE — Progress Notes (Signed)
Carotid Duplex Completed. °Brianna L Mazza,RVT °

## 2013-10-20 ENCOUNTER — Encounter: Payer: Self-pay | Admitting: *Deleted

## 2013-10-20 ENCOUNTER — Telehealth: Payer: Self-pay | Admitting: *Deleted

## 2013-10-20 DIAGNOSIS — I6529 Occlusion and stenosis of unspecified carotid artery: Secondary | ICD-10-CM

## 2013-10-20 NOTE — Telephone Encounter (Signed)
Order placed for repeat carotid dopplers in 6 months  

## 2013-10-20 NOTE — Telephone Encounter (Signed)
Message copied by Chauncy Lean on Wed Oct 20, 2013 12:34 PM ------      Message from: Lorretta Harp      Created: Wed Oct 20, 2013  5:38 AM       No change from prior study. Repeat in 6 months ------

## 2013-10-21 DIAGNOSIS — H2510 Age-related nuclear cataract, unspecified eye: Secondary | ICD-10-CM | POA: Diagnosis not present

## 2013-10-25 ENCOUNTER — Encounter: Payer: Self-pay | Admitting: *Deleted

## 2013-10-27 DIAGNOSIS — I6350 Cerebral infarction due to unspecified occlusion or stenosis of unspecified cerebral artery: Secondary | ICD-10-CM | POA: Diagnosis not present

## 2013-11-09 DIAGNOSIS — R51 Headache: Secondary | ICD-10-CM | POA: Diagnosis not present

## 2013-11-09 DIAGNOSIS — I6350 Cerebral infarction due to unspecified occlusion or stenosis of unspecified cerebral artery: Secondary | ICD-10-CM | POA: Diagnosis not present

## 2013-11-12 ENCOUNTER — Telehealth: Payer: Self-pay | Admitting: Cardiovascular Disease

## 2013-11-12 DIAGNOSIS — H2589 Other age-related cataract: Secondary | ICD-10-CM | POA: Diagnosis not present

## 2013-11-12 DIAGNOSIS — H353190 Nonexudative age-related macular degeneration, unspecified eye, stage unspecified: Secondary | ICD-10-CM | POA: Diagnosis not present

## 2013-11-12 NOTE — Telephone Encounter (Signed)
Returned call and verified pt x 2.  Informed Elroy Channel, PA that Curt Bears, Dr. Kennon Holter nurse, was notified and will discuss w/ Dr. [name] when he returns on 3.2.15.  Verbalized understanding.

## 2013-11-12 NOTE — Telephone Encounter (Signed)
Message forwarded to K. Vogel, RN to discuss w/ Dr. Berry.   

## 2013-11-12 NOTE — Telephone Encounter (Signed)
Pr is going to have cataract surgery under local. He wants to know if it is all right for the patient to have surgery?

## 2013-11-12 NOTE — Telephone Encounter (Signed)
This will be addressed with Dr [name] when he gets back into town 3/2.  Please make caller aware.

## 2013-11-23 NOTE — Telephone Encounter (Signed)
Letter drafted and faxed.

## 2013-11-23 NOTE — Telephone Encounter (Signed)
Please advise 

## 2013-11-23 NOTE — Telephone Encounter (Signed)
Okay to perform cataract surgery

## 2013-11-23 NOTE — Telephone Encounter (Signed)
I spoke with Gavin [name] and he asked me to fax the clearance to [phone].

## 2013-11-25 DIAGNOSIS — H348192 Central retinal vein occlusion, unspecified eye, stable: Secondary | ICD-10-CM | POA: Diagnosis not present

## 2013-11-25 DIAGNOSIS — H35379 Puckering of macula, unspecified eye: Secondary | ICD-10-CM | POA: Diagnosis not present

## 2013-11-26 DIAGNOSIS — M25519 Pain in unspecified shoulder: Secondary | ICD-10-CM | POA: Diagnosis not present

## 2013-11-26 DIAGNOSIS — M4712 Other spondylosis with myelopathy, cervical region: Secondary | ICD-10-CM | POA: Diagnosis not present

## 2013-12-02 ENCOUNTER — Other Ambulatory Visit: Payer: Self-pay | Admitting: *Deleted

## 2013-12-02 MED ORDER — ZALEPLON 10 MG PO CAPS: 10.0000 mg | ORAL_CAPSULE | Freq: Every evening | ORAL | Status: DC | PRN

## 2013-12-02 MED ORDER — NITROGLYCERIN 0.4 MG SL SUBL: 0.4000 mg | SUBLINGUAL_TABLET | SUBLINGUAL | Status: DC | PRN

## 2013-12-09 ENCOUNTER — Ambulatory Visit (INDEPENDENT_AMBULATORY_CARE_PROVIDER_SITE_OTHER): Payer: Medicare Other | Admitting: Internal Medicine

## 2013-12-09 ENCOUNTER — Encounter: Payer: Self-pay | Admitting: Internal Medicine

## 2013-12-09 VITALS — BP 138/62 | HR 60 | Ht 67.0 in | Wt 206.2 lb

## 2013-12-09 DIAGNOSIS — J449 Chronic obstructive pulmonary disease, unspecified: Secondary | ICD-10-CM

## 2013-12-09 DIAGNOSIS — I2510 Atherosclerotic heart disease of native coronary artery without angina pectoris: Secondary | ICD-10-CM | POA: Diagnosis not present

## 2013-12-09 DIAGNOSIS — G4733 Obstructive sleep apnea (adult) (pediatric): Secondary | ICD-10-CM

## 2013-12-09 NOTE — Patient Instructions (Signed)
Order- DME [facility]- change CPAP to 8 cwp                           Dx OSA                                                 - order chin strap for CPAP mask

## 2013-12-09 NOTE — Progress Notes (Signed)
09/13/11 -80 year old male former smoker followed for obstructive sleep apnea, COPD, complicated by GERD, CAD, history of lung nodule LOV- 09/13/2010 PCP Dr Ann Held. Has had flu vaccine and shingles vaccine this year. Getting hyperbaric treatment for circulatory ischemia/wound right foot. He continues CPAP [facility] using a nasal pillows mask. He is able to use this all night every night. We discussed his history of COPD-he quit smoking many years ago. He denies any wheeze, cough, or shortness of breath with his routine activities, recognizing that exertion is limited. I asked about his lung nodule. He had a CT scan at [place]. We will request that report.  09/11/12-80 year old male former smoker followed for obstructive sleep apnea, COPD, complicated by GERD, CAD, history of lung nodule  Wife is here FOLLOWS FOR: wears CPAP 9/ [facility] every night for about 6-7 hours each night and pressure working well for patient. No snoring through his mask. He continues cardiac rehabilitation 3 times per week and denies shortness of breath wheeze or cough. He is being evaluated for chronic left sided headaches. Workup for sinusitis was negative.  09/10/13- 80 year old male former smoker followed for obstructive sleep apnea, COPD, complicated by GERD, CAD, history of lung nodule  Wife is here FOLLOWS FOR: Breathing has been doing well. Currently using CPAP 9/ [facility]  machine every night. Has been falling asleep more often while sitting. Bedtime 11 PM to midnight, up between 9 and 10 AM. Sleeps through the night. Hallucinates about people and plants at night no sleepwalking and no kicking described.  12/09/13- 80 year old male former smoker followed for obstructive sleep apnea, COPD, complicated by GERD, CAD, history of lung nodule  Wife is here FOLLOWS FOR: needs order for chin strap sent to [facility]-has CPAP report attached- autotitration suggests  pressure 6-7, but too much leak.. Using CPAP 9/ [facility] Chronic headaches.  ROS-see HPI Constitutional:   No-   weight loss, night sweats, fevers, chills, +fatigue, lassitude. HEENT:   + headaches, no- difficulty swallowing, tooth/dental problems, sore throat,       No-  sneezing, itching, ear ache, nasal congestion, post nasal drip,  CV:  No- acute  chest pain, orthopnea, PND, swelling in lower extremities, anasarca, dizziness, palpitations Resp: No- acute  shortness of breath with exertion or at rest.              No-   productive cough,  No non-productive cough,  No- coughing up of blood.              No-   change in color of mucus.  No- wheezing.   Skin: No-   rash or lesions. GI:  No-   heartburn, indigestion, abdominal pain, nausea, vomiting,  GU:  MS:  Neuro-     nothing unusual Psych:  No- change in mood or affect. No depression or anxiety.  No memory loss.  OBJ General- Alert, Oriented, Affect-appropriate, Distress- none acute. Overweight Skin- rash-none, lesions- none, excoriation- none Lymphadenopathy- none Head- atraumatic            Eyes- Gross vision intact, PERRLA, conjunctivae clear secretions            Ears- Hearing, canals-normal            Nose- Clear, no-Septal dev, mucus, polyps, erosion, perforation             Throat- Mallampati II-III , mucosa clear , drainage- none, tonsils- atrophic Neck- flexible , trachea midline, no  stridor , thyroid nl, carotid no bruit Chest - symmetrical excursion , unlabored           Heart/CV- RRR , no murmur , no gallop  , no rub, nl s1 s2                           - JVD- none , edema- none, stasis changes- none, varices- none           Lung- +very diminished, unlabored, , wheeze- none, cough- none , dullness-none, rub- none           Chest wall-  Abd-  Br/ Gen/ Rectal- Not done, not indicated Extrem- cyanosis- none, clubbing, none, atrophy- none, strength- nl,  Neuro- grossly intact to observation

## 2013-12-11 ENCOUNTER — Other Ambulatory Visit (HOSPITAL_COMMUNITY): Payer: Self-pay | Admitting: Cardiovascular Disease

## 2013-12-13 NOTE — Telephone Encounter (Signed)
Rx was sent to pharmacy electronically. 

## 2013-12-17 DIAGNOSIS — M47812 Spondylosis without myelopathy or radiculopathy, cervical region: Secondary | ICD-10-CM | POA: Diagnosis not present

## 2013-12-17 DIAGNOSIS — G4700 Insomnia, unspecified: Secondary | ICD-10-CM | POA: Diagnosis not present

## 2013-12-17 DIAGNOSIS — D492 Neoplasm of unspecified behavior of bone, soft tissue, and skin: Secondary | ICD-10-CM | POA: Diagnosis not present

## 2013-12-17 DIAGNOSIS — M25519 Pain in unspecified shoulder: Secondary | ICD-10-CM | POA: Diagnosis not present

## 2013-12-22 DIAGNOSIS — C44221 Squamous cell carcinoma of skin of unspecified ear and external auricular canal: Secondary | ICD-10-CM | POA: Diagnosis not present

## 2013-12-23 DIAGNOSIS — M25519 Pain in unspecified shoulder: Secondary | ICD-10-CM | POA: Diagnosis not present

## 2013-12-23 DIAGNOSIS — M6281 Muscle weakness (generalized): Secondary | ICD-10-CM | POA: Diagnosis not present

## 2013-12-27 DIAGNOSIS — M6281 Muscle weakness (generalized): Secondary | ICD-10-CM | POA: Diagnosis not present

## 2013-12-27 DIAGNOSIS — M25519 Pain in unspecified shoulder: Secondary | ICD-10-CM | POA: Diagnosis not present

## 2013-12-30 ENCOUNTER — Encounter: Payer: Self-pay | Admitting: Internal Medicine

## 2013-12-30 DIAGNOSIS — M6281 Muscle weakness (generalized): Secondary | ICD-10-CM | POA: Diagnosis not present

## 2013-12-30 DIAGNOSIS — M25519 Pain in unspecified shoulder: Secondary | ICD-10-CM | POA: Diagnosis not present

## 2013-12-30 NOTE — Assessment & Plan Note (Signed)
Breath sounds distant, but no wheeze or cough.

## 2013-12-31 DIAGNOSIS — R413 Other amnesia: Secondary | ICD-10-CM | POA: Diagnosis not present

## 2013-12-31 DIAGNOSIS — R51 Headache: Secondary | ICD-10-CM | POA: Diagnosis not present

## 2014-01-03 DIAGNOSIS — G4700 Insomnia, unspecified: Secondary | ICD-10-CM | POA: Diagnosis not present

## 2014-01-03 DIAGNOSIS — Z0000 Encounter for general adult medical examination without abnormal findings: Secondary | ICD-10-CM | POA: Diagnosis not present

## 2014-01-03 DIAGNOSIS — L570 Actinic keratosis: Secondary | ICD-10-CM | POA: Diagnosis not present

## 2014-01-04 DIAGNOSIS — M25519 Pain in unspecified shoulder: Secondary | ICD-10-CM | POA: Diagnosis not present

## 2014-01-04 DIAGNOSIS — M6281 Muscle weakness (generalized): Secondary | ICD-10-CM | POA: Diagnosis not present

## 2014-01-06 DIAGNOSIS — M25519 Pain in unspecified shoulder: Secondary | ICD-10-CM | POA: Diagnosis not present

## 2014-01-06 DIAGNOSIS — M6281 Muscle weakness (generalized): Secondary | ICD-10-CM | POA: Diagnosis not present

## 2014-01-10 DIAGNOSIS — M25519 Pain in unspecified shoulder: Secondary | ICD-10-CM | POA: Diagnosis not present

## 2014-01-10 DIAGNOSIS — M6281 Muscle weakness (generalized): Secondary | ICD-10-CM | POA: Diagnosis not present

## 2014-01-13 DIAGNOSIS — M6281 Muscle weakness (generalized): Secondary | ICD-10-CM | POA: Diagnosis not present

## 2014-01-13 DIAGNOSIS — I70209 Unspecified atherosclerosis of native arteries of extremities, unspecified extremity: Secondary | ICD-10-CM | POA: Diagnosis not present

## 2014-01-13 DIAGNOSIS — M25519 Pain in unspecified shoulder: Secondary | ICD-10-CM | POA: Diagnosis not present

## 2014-01-13 DIAGNOSIS — B351 Tinea unguium: Secondary | ICD-10-CM | POA: Diagnosis not present

## 2014-01-14 DIAGNOSIS — R609 Edema, unspecified: Secondary | ICD-10-CM | POA: Diagnosis not present

## 2014-01-14 DIAGNOSIS — M7520 Bicipital tendinitis, unspecified shoulder: Secondary | ICD-10-CM | POA: Diagnosis not present

## 2014-01-15 DIAGNOSIS — H259 Unspecified age-related cataract: Secondary | ICD-10-CM | POA: Diagnosis not present

## 2014-01-17 DIAGNOSIS — M25519 Pain in unspecified shoulder: Secondary | ICD-10-CM | POA: Diagnosis not present

## 2014-01-17 DIAGNOSIS — M6281 Muscle weakness (generalized): Secondary | ICD-10-CM | POA: Diagnosis not present

## 2014-01-18 DIAGNOSIS — I4891 Unspecified atrial fibrillation: Secondary | ICD-10-CM | POA: Diagnosis not present

## 2014-01-18 DIAGNOSIS — H259 Unspecified age-related cataract: Secondary | ICD-10-CM | POA: Diagnosis not present

## 2014-01-18 DIAGNOSIS — Z9981 Dependence on supplemental oxygen: Secondary | ICD-10-CM | POA: Diagnosis not present

## 2014-01-18 DIAGNOSIS — I10 Essential (primary) hypertension: Secondary | ICD-10-CM | POA: Diagnosis not present

## 2014-01-18 DIAGNOSIS — K219 Gastro-esophageal reflux disease without esophagitis: Secondary | ICD-10-CM | POA: Diagnosis not present

## 2014-01-18 DIAGNOSIS — Z79899 Other long term (current) drug therapy: Secondary | ICD-10-CM | POA: Diagnosis not present

## 2014-01-18 DIAGNOSIS — H40009 Preglaucoma, unspecified, unspecified eye: Secondary | ICD-10-CM | POA: Diagnosis not present

## 2014-01-18 DIAGNOSIS — Z87891 Personal history of nicotine dependence: Secondary | ICD-10-CM | POA: Diagnosis not present

## 2014-01-18 DIAGNOSIS — G4730 Sleep apnea, unspecified: Secondary | ICD-10-CM | POA: Diagnosis not present

## 2014-01-18 DIAGNOSIS — I252 Old myocardial infarction: Secondary | ICD-10-CM | POA: Diagnosis not present

## 2014-01-18 DIAGNOSIS — H2589 Other age-related cataract: Secondary | ICD-10-CM | POA: Diagnosis not present

## 2014-01-20 DIAGNOSIS — M6281 Muscle weakness (generalized): Secondary | ICD-10-CM | POA: Diagnosis not present

## 2014-01-20 DIAGNOSIS — M25519 Pain in unspecified shoulder: Secondary | ICD-10-CM | POA: Diagnosis not present

## 2014-01-24 DIAGNOSIS — M6281 Muscle weakness (generalized): Secondary | ICD-10-CM | POA: Diagnosis not present

## 2014-01-24 DIAGNOSIS — M25519 Pain in unspecified shoulder: Secondary | ICD-10-CM | POA: Diagnosis not present

## 2014-01-27 DIAGNOSIS — M25519 Pain in unspecified shoulder: Secondary | ICD-10-CM | POA: Diagnosis not present

## 2014-01-27 DIAGNOSIS — M6281 Muscle weakness (generalized): Secondary | ICD-10-CM | POA: Diagnosis not present

## 2014-01-31 DIAGNOSIS — M25519 Pain in unspecified shoulder: Secondary | ICD-10-CM | POA: Diagnosis not present

## 2014-01-31 DIAGNOSIS — M6281 Muscle weakness (generalized): Secondary | ICD-10-CM | POA: Diagnosis not present

## 2014-02-01 DIAGNOSIS — I10 Essential (primary) hypertension: Secondary | ICD-10-CM | POA: Diagnosis not present

## 2014-02-01 DIAGNOSIS — H2589 Other age-related cataract: Secondary | ICD-10-CM | POA: Diagnosis not present

## 2014-02-01 DIAGNOSIS — Z7982 Long term (current) use of aspirin: Secondary | ICD-10-CM | POA: Diagnosis not present

## 2014-02-01 DIAGNOSIS — H40009 Preglaucoma, unspecified, unspecified eye: Secondary | ICD-10-CM | POA: Diagnosis not present

## 2014-02-01 DIAGNOSIS — H268 Other specified cataract: Secondary | ICD-10-CM | POA: Diagnosis not present

## 2014-02-01 DIAGNOSIS — Z87891 Personal history of nicotine dependence: Secondary | ICD-10-CM | POA: Diagnosis not present

## 2014-02-01 DIAGNOSIS — K219 Gastro-esophageal reflux disease without esophagitis: Secondary | ICD-10-CM | POA: Diagnosis not present

## 2014-02-01 DIAGNOSIS — I4891 Unspecified atrial fibrillation: Secondary | ICD-10-CM | POA: Diagnosis not present

## 2014-02-01 DIAGNOSIS — E785 Hyperlipidemia, unspecified: Secondary | ICD-10-CM | POA: Diagnosis not present

## 2014-02-01 DIAGNOSIS — Z9981 Dependence on supplemental oxygen: Secondary | ICD-10-CM | POA: Diagnosis not present

## 2014-02-01 DIAGNOSIS — H269 Unspecified cataract: Secondary | ICD-10-CM | POA: Diagnosis not present

## 2014-02-01 DIAGNOSIS — I2510 Atherosclerotic heart disease of native coronary artery without angina pectoris: Secondary | ICD-10-CM | POA: Diagnosis not present

## 2014-02-03 DIAGNOSIS — M25519 Pain in unspecified shoulder: Secondary | ICD-10-CM | POA: Diagnosis not present

## 2014-02-03 DIAGNOSIS — M6281 Muscle weakness (generalized): Secondary | ICD-10-CM | POA: Diagnosis not present

## 2014-02-07 DIAGNOSIS — M25519 Pain in unspecified shoulder: Secondary | ICD-10-CM | POA: Diagnosis not present

## 2014-02-07 DIAGNOSIS — M6281 Muscle weakness (generalized): Secondary | ICD-10-CM | POA: Diagnosis not present

## 2014-02-10 DIAGNOSIS — M6281 Muscle weakness (generalized): Secondary | ICD-10-CM | POA: Diagnosis not present

## 2014-02-10 DIAGNOSIS — M25519 Pain in unspecified shoulder: Secondary | ICD-10-CM | POA: Diagnosis not present

## 2014-02-15 DIAGNOSIS — M25519 Pain in unspecified shoulder: Secondary | ICD-10-CM | POA: Diagnosis not present

## 2014-02-15 DIAGNOSIS — M6281 Muscle weakness (generalized): Secondary | ICD-10-CM | POA: Diagnosis not present

## 2014-02-17 DIAGNOSIS — M25519 Pain in unspecified shoulder: Secondary | ICD-10-CM | POA: Diagnosis not present

## 2014-02-17 DIAGNOSIS — M6281 Muscle weakness (generalized): Secondary | ICD-10-CM | POA: Diagnosis not present

## 2014-02-21 DIAGNOSIS — M6281 Muscle weakness (generalized): Secondary | ICD-10-CM | POA: Diagnosis not present

## 2014-02-21 DIAGNOSIS — M25519 Pain in unspecified shoulder: Secondary | ICD-10-CM | POA: Diagnosis not present

## 2014-02-24 DIAGNOSIS — M6281 Muscle weakness (generalized): Secondary | ICD-10-CM | POA: Diagnosis not present

## 2014-02-24 DIAGNOSIS — M25519 Pain in unspecified shoulder: Secondary | ICD-10-CM | POA: Diagnosis not present

## 2014-02-28 ENCOUNTER — Other Ambulatory Visit: Payer: Self-pay | Admitting: *Deleted

## 2014-02-28 MED ORDER — METOPROLOL TARTRATE 50 MG PO TABS: 50.0000 mg | ORAL_TABLET | Freq: Two times a day (BID) | ORAL | Status: DC

## 2014-03-02 DIAGNOSIS — M25519 Pain in unspecified shoulder: Secondary | ICD-10-CM | POA: Diagnosis not present

## 2014-03-02 DIAGNOSIS — F341 Dysthymic disorder: Secondary | ICD-10-CM | POA: Diagnosis not present

## 2014-03-12 ENCOUNTER — Other Ambulatory Visit: Payer: Self-pay | Admitting: Cardiovascular Disease

## 2014-03-14 NOTE — Telephone Encounter (Signed)
Rx was sent to pharmacy electronically. 

## 2014-03-18 ENCOUNTER — Ambulatory Visit (INDEPENDENT_AMBULATORY_CARE_PROVIDER_SITE_OTHER): Payer: Medicare Other | Admitting: Cardiovascular Disease

## 2014-03-18 ENCOUNTER — Encounter: Payer: Self-pay | Admitting: Cardiovascular Disease

## 2014-03-18 VITALS — BP 124/62 | HR 63 | Ht 67.0 in | Wt 211.0 lb

## 2014-03-18 DIAGNOSIS — Z79899 Other long term (current) drug therapy: Secondary | ICD-10-CM | POA: Diagnosis not present

## 2014-03-18 DIAGNOSIS — I2510 Atherosclerotic heart disease of native coronary artery without angina pectoris: Secondary | ICD-10-CM | POA: Diagnosis not present

## 2014-03-18 DIAGNOSIS — I739 Peripheral vascular disease, unspecified: Secondary | ICD-10-CM

## 2014-03-18 DIAGNOSIS — E785 Hyperlipidemia, unspecified: Secondary | ICD-10-CM

## 2014-03-18 DIAGNOSIS — I4891 Unspecified atrial fibrillation: Secondary | ICD-10-CM | POA: Diagnosis not present

## 2014-03-18 DIAGNOSIS — I779 Disorder of arteries and arterioles, unspecified: Secondary | ICD-10-CM

## 2014-03-18 DIAGNOSIS — I480 Paroxysmal atrial fibrillation: Secondary | ICD-10-CM

## 2014-03-18 NOTE — Progress Notes (Signed)
03/18/2014 Gavin [name]   15-Dec-1928  [id number]  Primary Physician Gavin Held, MD Primary Cardiologist: Gavin Harp MD Gavin [name]   HPI:  80year-old white married male presentted to the ER with 05/30/13 chest pain that awakened him from sleep. He has a history of coronary disease with last cardiac catheterization July 2010 revealing an occluded LAD stent otherwise normal coronary arteries. At that time his EF was 30%, subsequent 2-D echo in 2013 revealed EF is normal. He had a stress test in 2012 with mod to severe perfusion defect, maybe slightly worse than previous study.At that time the pt was without symptoms so no further work up was done. He also has a history of PAF and is on Coumadin for anticoagulation. He has been maintaining sinus rhythm. He has peripheral vascular disease with stenting by Dr. Gwenlyn [name] to both SFAs in 2004 with 0 vessel runoff on the right and one on the left via peroneal. he also has bilateral carotid disease left greater than right. Other problems include hypertension hyperlipidemia, and obstructive sleep apnea on CPAP. He has chronic headache with neg. head CT in June, and recently per our office stopped his IMDUR to see if Headache would resolve. It did not. He resumed the Imdur 05/29/13. He was admitted 05/29/13 with recurrent chest pain worrisome for unstable angina. His Coumadin was [name] His Troponin was negative. He underwent coronary angiogram 06/01/13. This revealed moderate RCA, ostial CFX, and distal Dx1 disease with know distal LAD occlusion. There was no obvious culprit lesion and the plan is for medical Rx. Norvasc and Ranexa were added and his Imdur was decreased.he was seen by [name] in the office 06/08/13 for post consultation followup. He was pain-free. I last saw him in the office earlier this year. He has paroxysmal atrial fibrillation on Coumadin anticoagulation. He has fallen several times one involving head trauma.  Since I saw  him 6 months ago he denies chest pain or shortness of breath. His major complaint is pain in both of his feet.    Current Outpatient Prescriptions  Medication Sig Dispense Refill  . acetaminophen (TYLENOL) 325 MG tablet Take 2 tablets (650 mg total) by mouth every 4 (four) hours as needed.      Marland Kitchen amLODipine (NORVASC) 2.5 MG tablet Take 5 mg by mouth daily.      Marland Kitchen aspirin 81 MG tablet Take 81 mg by mouth daily.      Marland Kitchen atorvastatin (LIPITOR) 40 MG tablet take 1 tablet by mouth at bedtime  31 tablet  11  . cetirizine (ZYRTEC) 10 MG tablet Take 10 mg by mouth daily.      . Cholecalciferol (VITAMIN D) 2000 UNITS CAPS Take 1 capsule by mouth daily.      . cyclobenzaprine (FLEXERIL) 5 MG tablet Take 5 mg by mouth at bedtime.      . diclofenac sodium (VOLTAREN) 1 % GEL Apply 2 g topically daily as needed (pain).      . DULoxetine (CYMBALTA) 60 MG capsule Take 60 mg by mouth daily.      . enalapril (VASOTEC) 20 MG tablet Take 20 mg by mouth every evening.      Marland Kitchen Fesoterodine Fumarate (TOVIAZ) 8 MG TB24 Take 8 mg by mouth daily.      . furosemide (LASIX) 40 MG tablet Take 1/3 tablet every 3rd day      . isosorbide mononitrate (IMDUR) 30 MG 24 hr tablet Take 30 mg by mouth 2 (  two) times daily.      . Memantine HCl ER (NAMENDA XR) 28 MG CP24 Take 1 capsule by mouth daily.      . metoprolol (LOPRESSOR) 50 MG tablet Take 1 tablet (50 mg total) by mouth 2 (two) times daily.  60 tablet  9  . Misc Natural Products (LUTEIN VISION BLEND PO) Take 1 tablet by mouth 2 (two) times daily.      . Multiple Vitamin (MULTIVITAMIN) tablet Take 1 tablet by mouth daily.      Marland Kitchen NASONEX 50 MCG/ACT nasal spray Place 2 sprays into the nose 2 (two) times daily.       . nitroGLYCERIN (NITROSTAT) 0.4 MG SL tablet Place 1 tablet (0.4 mg total) under the tongue every 5 (five) minutes as needed.  25 tablet  2  . pantoprazole (PROTONIX) 40 MG tablet take 1 tablet by mouth once daily  30 tablet  6  . potassium chloride (KLOR-CON  M10) 10 MEQ tablet Take 1 tablet (10 mEq total) by mouth every other day.  30 tablet  10  . RANEXA 500 MG 12 hr tablet take 1 tablet by mouth twice a day  60 tablet  9  . sertraline (ZOLOFT) 50 MG tablet Take 50 mg by mouth every morning.      . vitamin B-12 (CYANOCOBALAMIN) 1000 MCG tablet Take 1,000 mcg by mouth daily.       No current facility-administered medications for this visit.    Allergies  Allergen Reactions  . Ciprofloxacin     unknown  . Codeine     "knocked me out"  . Dexamethasone     unknown  . Sulfonamide Derivatives     Unknown reaction  . Telmisartan     unknown    History   Social History  . Marital Status: Married    Spouse Name: N/A    Number of Children: N/A  . Years of Education: N/A   Occupational History  . 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 Smoking status: Former Smoker    Types: Cigarettes    Quit date: 09/23/1973  . Smokeless tobacco: Never Used     Comment: tried snuff and chew as a child (31yr) did not continue  . Alcohol Use: No     Comment: seldom-- every three or four years  . Drug Use: No  . Sexual Activity: Not on file   Other Topics Concern  . Not on file   Social History Narrative  . No narrative on file     Review of Systems: General: negative for chills, fever, night sweats or weight changes.  Cardiovascular: negative for chest pain, dyspnea on exertion, edema, orthopnea, palpitations, paroxysmal nocturnal dyspnea or shortness of breath Dermatological: negative for rash Respiratory: negative for cough or wheezing Urologic: negative for hematuria Abdominal: negative for nausea, vomiting, diarrhea, bright red blood per rectum, melena, or hematemesis Neurologic: negative for visual changes, syncope, or dizziness All other systems reviewed and are otherwise negative except as noted above.    Blood pressure 124/62, pulse 63, height 5\' 7"  (1.702 m), weight 211 lb (95.709 kg).    General appearance: alert and no distress Neck: no adenopathy, no JVD, supple, symmetrical, trachea midline, thyroid not enlarged, symmetric, no tenderness/mass/nodules and soft bilateral carotid bruits Lungs: clear to auscultation bilaterally Heart: regular rate and rhythm, S1, S2 normal, no murmur, click, rub or gallop Extremities: 2+ edema bilaterally  EKG normal sinus rhythm at 67 without ST  or T wave changes  ASSESSMENT AND PLAN:   PAF (paroxysmal atrial fibrillation) Maintaining sinus rhythm on Coumadin in the past which was discontinued because of fall risk he is currently on aspirin  PAD (peripheral artery disease), history of stenting to both SFAs History of bilateral SFA stenting with severe tibial vessel disease, 0 vessel runoff on the right and one on the left. He has had critical limb ischemia in the past with wounds have healed at the wound center. He does have the pain but hasn't no evidence of critical limb ischemia at this time. We continue to follow him by duplex ultrasound annually  Carotid artery disease Moderate carotid disease by duplex ultrasound. Neurologically asymptomatic on aspirin. Continue to follow this noninvasively  Hyperlipidemia LDL goal < 70 On statin therapy. We will recheck a lipid liver profile  CAD, Multiple PCIs to  LAD, as well as RCA and Dx with ultimate LAD occlusion Jan 2010.   Cath 06/01/13- medical Rx History of CAD LV dysfunction with known occluded LAD and an EF of 30%. His last cardiac cath 06/01/13 revealed moderate RCA, ostial circumflex and diagonal branch disease and no distal LAD occlusion. This was done in the setting of unstable angina. He has been asymptomatic on Ranexa      Gavin Harp MD [facility] 03/18/2014 12:03 PM

## 2014-03-18 NOTE — Assessment & Plan Note (Signed)
Moderate carotid disease by duplex ultrasound. Neurologically asymptomatic on aspirin. Continue to follow this noninvasively

## 2014-03-18 NOTE — Assessment & Plan Note (Signed)
History of CAD LV dysfunction with known occluded LAD and an EF of 30%. His last cardiac cath 06/01/13 revealed moderate RCA, ostial circumflex and diagonal branch disease and no distal LAD occlusion. This was done in the setting of unstable angina. He has been asymptomatic on Ranexa

## 2014-03-18 NOTE — Assessment & Plan Note (Signed)
History of bilateral SFA stenting with severe tibial vessel disease, 0 vessel runoff on the right and one on the left. He has had critical limb ischemia in the past with wounds have healed at the wound center. He does have the pain but hasn't no evidence of critical limb ischemia at this time. We continue to follow him by duplex ultrasound annually

## 2014-03-18 NOTE — Patient Instructions (Signed)
Your physician wants you to follow-up in: 6 months with Dr [name]. You will receive a reminder letter in the mail two months in advance. If you don't receive a letter, please call our office to schedule the follow-up appointment.  Your physician recommends that you return for a FASTING lipid profile

## 2014-03-18 NOTE — Assessment & Plan Note (Signed)
Maintaining sinus rhythm on Coumadin in the past which was discontinued because of fall risk he is currently on aspirin

## 2014-03-18 NOTE — Assessment & Plan Note (Signed)
On statin therapy. We will recheck a lipid liver profile. 

## 2014-03-24 DIAGNOSIS — E785 Hyperlipidemia, unspecified: Secondary | ICD-10-CM | POA: Diagnosis not present

## 2014-03-24 DIAGNOSIS — Z79899 Other long term (current) drug therapy: Secondary | ICD-10-CM | POA: Diagnosis not present

## 2014-03-24 LAB — HEPATIC FUNCTION PANEL
ALT: 13 U/L (ref 0–53)
AST: 19 U/L (ref 0–37)
Albumin: 3.6 g/dL (ref 3.5–5.2)
Alkaline Phosphatase: 56 U/L (ref 39–117)
BILIRUBIN DIRECT: 0.2 mg/dL (ref 0.0–0.3)
BILIRUBIN TOTAL: 0.8 mg/dL (ref 0.2–1.2)
Indirect Bilirubin: 0.6 mg/dL (ref 0.2–1.2)
Total Protein: 6 g/dL (ref 6.0–8.3)

## 2014-03-24 LAB — LIPID PANEL
Cholesterol: 105 mg/dL (ref 0–200)
HDL: 40 mg/dL (ref >39)
LDL Cholesterol: 50 mg/dL (ref 0–99)
Total CHOL/HDL Ratio: 2.6 Ratio
Triglycerides: 77 mg/dL (ref <150)
VLDL: 15 mg/dL (ref 0–40)

## 2014-03-26 DIAGNOSIS — M542 Cervicalgia: Secondary | ICD-10-CM | POA: Diagnosis not present

## 2014-03-26 DIAGNOSIS — I10 Essential (primary) hypertension: Secondary | ICD-10-CM | POA: Diagnosis not present

## 2014-03-26 DIAGNOSIS — R296 Repeated falls: Secondary | ICD-10-CM | POA: Diagnosis not present

## 2014-03-26 DIAGNOSIS — S5000XA Contusion of unspecified elbow, initial encounter: Secondary | ICD-10-CM | POA: Diagnosis not present

## 2014-03-26 DIAGNOSIS — IMO0002 Reserved for concepts with insufficient information to code with codable children: Secondary | ICD-10-CM | POA: Diagnosis not present

## 2014-03-26 DIAGNOSIS — R51 Headache: Secondary | ICD-10-CM | POA: Diagnosis not present

## 2014-03-26 DIAGNOSIS — Z23 Encounter for immunization: Secondary | ICD-10-CM | POA: Diagnosis not present

## 2014-03-26 DIAGNOSIS — S060X0A Concussion without loss of consciousness, initial encounter: Secondary | ICD-10-CM | POA: Diagnosis not present

## 2014-03-26 DIAGNOSIS — S51009A Unspecified open wound of unspecified elbow, initial encounter: Secondary | ICD-10-CM | POA: Diagnosis not present

## 2014-03-26 DIAGNOSIS — I2510 Atherosclerotic heart disease of native coronary artery without angina pectoris: Secondary | ICD-10-CM | POA: Diagnosis not present

## 2014-03-26 DIAGNOSIS — M25529 Pain in unspecified elbow: Secondary | ICD-10-CM | POA: Diagnosis not present

## 2014-04-04 DIAGNOSIS — F0781 Postconcussional syndrome: Secondary | ICD-10-CM | POA: Diagnosis not present

## 2014-04-04 DIAGNOSIS — I951 Orthostatic hypotension: Secondary | ICD-10-CM | POA: Diagnosis not present

## 2014-04-18 DIAGNOSIS — I6789 Other cerebrovascular disease: Secondary | ICD-10-CM | POA: Diagnosis not present

## 2014-04-18 DIAGNOSIS — F0781 Postconcussional syndrome: Secondary | ICD-10-CM | POA: Diagnosis not present

## 2014-04-18 DIAGNOSIS — S0990XA Unspecified injury of head, initial encounter: Secondary | ICD-10-CM | POA: Diagnosis not present

## 2014-04-20 ENCOUNTER — Ambulatory Visit (HOSPITAL_BASED_OUTPATIENT_CLINIC_OR_DEPARTMENT_OTHER)
Admission: RE | Admit: 2014-04-20 | Discharge: 2014-04-20 | Disposition: A | Discharge status: Home / Self Care | Payer: Medicare Other | Source: Ambulatory Visit | Attending: Cardiovascular Disease | Admitting: Cardiovascular Disease

## 2014-04-20 ENCOUNTER — Ambulatory Visit (HOSPITAL_COMMUNITY)
Admission: RE | Admit: 2014-04-20 | Discharge: 2014-04-20 | Disposition: A | Discharge status: Home / Self Care | Payer: Medicare Other | Source: Ambulatory Visit | Attending: Cardiovascular Disease | Admitting: Cardiovascular Disease

## 2014-04-20 DIAGNOSIS — I6529 Occlusion and stenosis of unspecified carotid artery: Secondary | ICD-10-CM | POA: Diagnosis not present

## 2014-04-20 DIAGNOSIS — I739 Peripheral vascular disease, unspecified: Secondary | ICD-10-CM | POA: Diagnosis not present

## 2014-04-20 DIAGNOSIS — I70219 Atherosclerosis of native arteries of extremities with intermittent claudication, unspecified extremity: Secondary | ICD-10-CM

## 2014-04-20 DIAGNOSIS — I779 Disorder of arteries and arterioles, unspecified: Secondary | ICD-10-CM | POA: Diagnosis not present

## 2014-04-20 NOTE — Progress Notes (Signed)
Carotid Duplex Completed. Idali Lafever, BS, RDMS, RVT  

## 2014-04-20 NOTE — Progress Notes (Signed)
Arterial Duplex Lower Ext. Completed. Rees Santistevan, BS, RDMS, RVT  

## 2014-05-01 DIAGNOSIS — I621 Nontraumatic extradural hemorrhage: Secondary | ICD-10-CM | POA: Diagnosis not present

## 2014-05-01 DIAGNOSIS — K219 Gastro-esophageal reflux disease without esophagitis: Secondary | ICD-10-CM | POA: Diagnosis not present

## 2014-05-01 DIAGNOSIS — I509 Heart failure, unspecified: Secondary | ICD-10-CM | POA: Diagnosis not present

## 2014-05-01 DIAGNOSIS — S0993XA Unspecified injury of face, initial encounter: Secondary | ICD-10-CM | POA: Diagnosis not present

## 2014-05-01 DIAGNOSIS — S199XXA Unspecified injury of neck, initial encounter: Secondary | ICD-10-CM | POA: Diagnosis not present

## 2014-05-01 DIAGNOSIS — E7800 Pure hypercholesterolemia, unspecified: Secondary | ICD-10-CM | POA: Diagnosis not present

## 2014-05-01 DIAGNOSIS — S0003XA Contusion of scalp, initial encounter: Secondary | ICD-10-CM | POA: Diagnosis not present

## 2014-05-01 DIAGNOSIS — R42 Dizziness and giddiness: Secondary | ICD-10-CM | POA: Diagnosis not present

## 2014-05-01 DIAGNOSIS — R0989 Other specified symptoms and signs involving the circulatory and respiratory systems: Secondary | ICD-10-CM | POA: Diagnosis not present

## 2014-05-01 DIAGNOSIS — Z79899 Other long term (current) drug therapy: Secondary | ICD-10-CM | POA: Diagnosis not present

## 2014-05-01 DIAGNOSIS — S0083XA Contusion of other part of head, initial encounter: Secondary | ICD-10-CM | POA: Diagnosis not present

## 2014-05-01 DIAGNOSIS — I4891 Unspecified atrial fibrillation: Secondary | ICD-10-CM | POA: Diagnosis not present

## 2014-05-01 DIAGNOSIS — W1809XA Striking against other object with subsequent fall, initial encounter: Secondary | ICD-10-CM | POA: Diagnosis not present

## 2014-05-01 DIAGNOSIS — I2510 Atherosclerotic heart disease of native coronary artery without angina pectoris: Secondary | ICD-10-CM | POA: Diagnosis not present

## 2014-05-01 DIAGNOSIS — I10 Essential (primary) hypertension: Secondary | ICD-10-CM | POA: Diagnosis not present

## 2014-05-02 ENCOUNTER — Other Ambulatory Visit: Payer: Self-pay

## 2014-05-02 MED ORDER — ZALEPLON 10 MG PO CAPS: 10.0000 mg | ORAL_CAPSULE | Freq: Every evening | ORAL | Status: DC | PRN

## 2014-05-06 DIAGNOSIS — F0781 Postconcussional syndrome: Secondary | ICD-10-CM | POA: Diagnosis not present

## 2014-05-10 DIAGNOSIS — R262 Difficulty in walking, not elsewhere classified: Secondary | ICD-10-CM | POA: Diagnosis not present

## 2014-05-10 DIAGNOSIS — M6281 Muscle weakness (generalized): Secondary | ICD-10-CM | POA: Diagnosis not present

## 2014-05-10 DIAGNOSIS — Z5189 Encounter for other specified aftercare: Secondary | ICD-10-CM | POA: Diagnosis not present

## 2014-05-12 DIAGNOSIS — R262 Difficulty in walking, not elsewhere classified: Secondary | ICD-10-CM | POA: Diagnosis not present

## 2014-05-12 DIAGNOSIS — Z5189 Encounter for other specified aftercare: Secondary | ICD-10-CM | POA: Diagnosis not present

## 2014-05-12 DIAGNOSIS — M6281 Muscle weakness (generalized): Secondary | ICD-10-CM | POA: Diagnosis not present

## 2014-05-18 DIAGNOSIS — M6281 Muscle weakness (generalized): Secondary | ICD-10-CM | POA: Diagnosis not present

## 2014-05-18 DIAGNOSIS — Z5189 Encounter for other specified aftercare: Secondary | ICD-10-CM | POA: Diagnosis not present

## 2014-05-18 DIAGNOSIS — R262 Difficulty in walking, not elsewhere classified: Secondary | ICD-10-CM | POA: Diagnosis not present

## 2014-05-19 DIAGNOSIS — R51 Headache: Secondary | ICD-10-CM | POA: Diagnosis not present

## 2014-05-19 DIAGNOSIS — R413 Other amnesia: Secondary | ICD-10-CM | POA: Diagnosis not present

## 2014-05-20 DIAGNOSIS — R262 Difficulty in walking, not elsewhere classified: Secondary | ICD-10-CM | POA: Diagnosis not present

## 2014-05-20 DIAGNOSIS — M6281 Muscle weakness (generalized): Secondary | ICD-10-CM | POA: Diagnosis not present

## 2014-05-20 DIAGNOSIS — Z5189 Encounter for other specified aftercare: Secondary | ICD-10-CM | POA: Diagnosis not present

## 2014-05-23 DIAGNOSIS — F0781 Postconcussional syndrome: Secondary | ICD-10-CM | POA: Diagnosis not present

## 2014-05-23 DIAGNOSIS — Z23 Encounter for immunization: Secondary | ICD-10-CM | POA: Diagnosis not present

## 2014-05-24 DIAGNOSIS — R262 Difficulty in walking, not elsewhere classified: Secondary | ICD-10-CM | POA: Diagnosis not present

## 2014-05-24 DIAGNOSIS — Z5189 Encounter for other specified aftercare: Secondary | ICD-10-CM | POA: Diagnosis not present

## 2014-05-24 DIAGNOSIS — M6281 Muscle weakness (generalized): Secondary | ICD-10-CM | POA: Diagnosis not present

## 2014-05-27 DIAGNOSIS — M6281 Muscle weakness (generalized): Secondary | ICD-10-CM | POA: Diagnosis not present

## 2014-05-27 DIAGNOSIS — Z5189 Encounter for other specified aftercare: Secondary | ICD-10-CM | POA: Diagnosis not present

## 2014-05-27 DIAGNOSIS — R262 Difficulty in walking, not elsewhere classified: Secondary | ICD-10-CM | POA: Diagnosis not present

## 2014-05-31 DIAGNOSIS — Z5189 Encounter for other specified aftercare: Secondary | ICD-10-CM | POA: Diagnosis not present

## 2014-05-31 DIAGNOSIS — R262 Difficulty in walking, not elsewhere classified: Secondary | ICD-10-CM | POA: Diagnosis not present

## 2014-05-31 DIAGNOSIS — M6281 Muscle weakness (generalized): Secondary | ICD-10-CM | POA: Diagnosis not present

## 2014-06-02 ENCOUNTER — Telehealth: Payer: Self-pay | Admitting: Cardiovascular Disease

## 2014-06-02 DIAGNOSIS — Z5189 Encounter for other specified aftercare: Secondary | ICD-10-CM | POA: Diagnosis not present

## 2014-06-02 DIAGNOSIS — M6281 Muscle weakness (generalized): Secondary | ICD-10-CM | POA: Diagnosis not present

## 2014-06-02 DIAGNOSIS — R262 Difficulty in walking, not elsewhere classified: Secondary | ICD-10-CM | POA: Diagnosis not present

## 2014-06-02 NOTE — Telephone Encounter (Signed)
Spoke with Abigail Butts -physical therapist  from  [facility]. She calls to inform that she has home therapy with the patient twice a week and noticed that his heart rate will drop down into the 40's. She also states the patient is orthostatic with standing. She states that she has been in consultation with his PCP in reference to this. Far as she is aware no medication changes have been made. States that it was reported that the patient gets dizzy and has fallen a lot, however he hasn't fallen since she has been seeing him. She informs me that his systolic B/P  Has been in the 130's lately. But will tend to run low. She reports that when he does fall it is suddenly and she is not sure if it is due to his heart rate or not, this is the reason for her reporting his symptoms to [place]. She just wanted to make Dr. [name] aware of what's going on with the patient.

## 2014-06-02 NOTE — Telephone Encounter (Signed)
Message.

## 2014-06-03 NOTE — Telephone Encounter (Signed)
Return office visit to [facility] to evaluate blood pressure control and medications

## 2014-06-03 NOTE — Telephone Encounter (Signed)
i spoke with with patient and made an appt for [facility] on 9/14 for blood pressure and medication review.

## 2014-06-06 ENCOUNTER — Ambulatory Visit (INDEPENDENT_AMBULATORY_CARE_PROVIDER_SITE_OTHER): Payer: Medicare Other | Admitting: Pharmacist Clinician (PhC)/ Clinical Pharmacy Specialist

## 2014-06-06 ENCOUNTER — Encounter: Payer: Self-pay | Admitting: Pharmacist Clinician (PhC)/ Clinical Pharmacy Specialist

## 2014-06-06 VITALS — BP 162/70 | HR 60 | Ht 67.0 in | Wt 199.0 lb

## 2014-06-06 DIAGNOSIS — I2510 Atherosclerotic heart disease of native coronary artery without angina pectoris: Secondary | ICD-10-CM

## 2014-06-06 DIAGNOSIS — I10 Essential (primary) hypertension: Secondary | ICD-10-CM

## 2014-06-06 NOTE — Patient Instructions (Signed)
Return for a a follow up appointment in 1 month  Your blood pressure today is 162/70  Check your blood pressure at home daily (if able) and keep record of the readings.  Bring all of your meds, your BP cuff and your record of home blood pressures to your next appointment.  Exercise as you're able, try to walk approximately 30 minutes per day.  Keep salt intake to a minimum, especially watch canned and prepared boxed foods.  Eat more fresh fruits and vegetables and fewer canned items.  Avoid eating in fast food restaurants.    HOW TO TAKE YOUR BLOOD PRESSURE:   Rest 5 minutes before taking your blood pressure.    Don't smoke or drink caffeinated beverages for at least 30 minutes before.   Take your blood pressure before (not after) you eat.   Sit comfortably with your back supported and both feet on the floor (don't cross your legs).   Elevate your arm to heart level on a table or a desk.   Use the proper sized cuff. It should fit smoothly and snugly around your bare upper arm. There should be enough room to slip a fingertip under the cuff. The bottom edge of the cuff should be 1 inch above the crease of the elbow.   Ideally, take 3 measurements at one sitting and record the average.

## 2014-06-06 NOTE — Progress Notes (Signed)
06/06/2014 Gavin [name] 08-05-1929 [id number]   HPI:  Gavin [name] is a 80 y.o. male patient of Dr. [name], with a PMH below who presents today for a blood pressure check.  He was a previous anticoagulation patient, during those past visits his blood pressure was fairly well controlled on medication.  Today he is in because of a concern with home therapy that his BP and HR may be dropping too low.  He has periods of dizziness that often lead to falls, their concern is that it may be related to either of these.  Home readings that have been taken range between 710-626 systolic, and while most of the diastolic readings are WNL, a few have been as high as 100.   They did not record any heart rates.   He is not a current smoker, having quit about 40 years ago and does not drink alcohol.  He is unable to do regular exercise because of unsteady gait and fall risk.   His biggest complaint is in regards to constant underlying headaches, which he has had for many years without relief.   At one time we discontinued isosorbide, but there was no change, so it was restarted.    Currently he takes only one medication that affects his blood pressure, metoprolol 50mg  bid.  He had been on amlodipine 5mg , enalapril 20mg  and furosemide prn in the past.  They were all discontinued by his primary care physician.      Current Outpatient Prescriptions  Medication Sig Dispense Refill  . donepezil (ARICEPT) 5 MG tablet Take 5 mg by mouth at bedtime.      Marland Kitchen zonisamide (ZONEGRAN) 100 MG capsule Take 100 mg by mouth at bedtime.      Marland Kitchen acetaminophen (TYLENOL) 325 MG tablet Take 2 tablets (650 mg total) by mouth every 4 (four) hours as needed.      Marland Kitchen aspirin 81 MG tablet Take 81 mg by mouth daily.      Marland Kitchen atorvastatin (LIPITOR) 40 MG tablet take 1 tablet by mouth at bedtime  31 tablet  11  . cetirizine (ZYRTEC) 10 MG tablet Take 10 mg by mouth daily.      . Cholecalciferol (VITAMIN D) 2000 UNITS CAPS Take 1 capsule by  mouth daily.      . cyclobenzaprine (FLEXERIL) 5 MG tablet Take 5 mg by mouth at bedtime as needed.       . diclofenac sodium (VOLTAREN) 1 % GEL Apply 2 g topically daily as needed (pain).      . DULoxetine (CYMBALTA) 60 MG capsule Take 60 mg by mouth daily.      Marland Kitchen Fesoterodine Fumarate (TOVIAZ) 8 MG TB24 Take 8 mg by mouth daily.      . isosorbide mononitrate (IMDUR) 30 MG 24 hr tablet Take 30 mg by mouth 2 (two) times daily.      . Memantine HCl ER (NAMENDA XR) 28 MG CP24 Take 1 capsule by mouth daily.      . Misc Natural Products (LUTEIN VISION BLEND PO) Take 1 tablet by mouth 2 (two) times daily.      . Multiple Vitamin (MULTIVITAMIN) tablet Take 1 tablet by mouth daily.      Marland Kitchen NASONEX 50 MCG/ACT nasal spray Place 2 sprays into the nose 2 (two) times daily.       . nitroGLYCERIN (NITROSTAT) 0.4 MG SL tablet Place 1 tablet (0.4 mg total) under the tongue every 5 (five) minutes as needed.  25 tablet  2  . pantoprazole (PROTONIX) 40 MG tablet take 1 tablet by mouth once daily  30 tablet  6  . RANEXA 500 MG 12 hr tablet take 1 tablet by mouth twice a day  60 tablet  9  . sertraline (ZOLOFT) 50 MG tablet Take 50 mg by mouth every morning.      . vitamin B-12 (CYANOCOBALAMIN) 1000 MCG tablet Take 1,000 mcg by mouth daily.       No current facility-administered medications for this visit.    Allergies  Allergen Reactions  . Ciprofloxacin     unknown  . Codeine     "knocked me out"  . Dexamethasone     unknown  . Sulfonamide Derivatives     Unknown reaction  . Telmisartan     unknown    Past Medical History  Diagnosis Date  . OSA (obstructive sleep apnea)     NPSG 05-05-99 RDI 65/hr-CPAP  . Chronic airway obstruction, not elsewhere classified   . Lung nodules     CT 07-01-10 stable @ [place]  . CAD (coronary artery disease)     Hx of multiple PCIs   . Reflux esophagitis   . GERD (gastroesophageal reflux disease)   . Hypertension   . Hyperlipidemia   . PVD (peripheral vascular  disease) 2004    stenting of both SFA's 2004  . Carotid disease, bilateral     left greater than right  . STEMI (ST elevation myocardial infarction) 2007    stent to LAD  . Headache     negative work up, no change off Imdur    Blood pressure 162/70, pulse 60, height 5\' 7"  (1.702 m), weight 199 lb (90.266 kg).   ASSESSMENT AND PLAN:  Gavin [name] PharmD CPP [facility]

## 2014-06-06 NOTE — Assessment & Plan Note (Signed)
Today his BP is elevated at 162/70.  His home readings are quite varied, with no times of day given.  They did not bring his home meter, so I don't know the accuracy of these readings.  Per Home Health, most of their readings have been in the 867E systolic.  Because we have little to go on, I have asked that they take his BP each morning and given them written instructions on proper BP technique.  I have also asked that they take his pressure later in the day if he develops any symptoms of dizziness, instability or just feels "off".  I have stressed that they need to also record heart rates with all BP readings.  I will see him back in one month, he is to bring in the log of readings as well as his BP cuff.   At that time we can determine if there is a need for a change in medications.

## 2014-06-07 DIAGNOSIS — M6281 Muscle weakness (generalized): Secondary | ICD-10-CM | POA: Diagnosis not present

## 2014-06-07 DIAGNOSIS — Z5189 Encounter for other specified aftercare: Secondary | ICD-10-CM | POA: Diagnosis not present

## 2014-06-07 DIAGNOSIS — R262 Difficulty in walking, not elsewhere classified: Secondary | ICD-10-CM | POA: Diagnosis not present

## 2014-06-09 DIAGNOSIS — Z5189 Encounter for other specified aftercare: Secondary | ICD-10-CM | POA: Diagnosis not present

## 2014-06-09 DIAGNOSIS — R262 Difficulty in walking, not elsewhere classified: Secondary | ICD-10-CM | POA: Diagnosis not present

## 2014-06-09 DIAGNOSIS — M6281 Muscle weakness (generalized): Secondary | ICD-10-CM | POA: Diagnosis not present

## 2014-06-13 DIAGNOSIS — Z5189 Encounter for other specified aftercare: Secondary | ICD-10-CM | POA: Diagnosis not present

## 2014-06-13 DIAGNOSIS — M6281 Muscle weakness (generalized): Secondary | ICD-10-CM | POA: Diagnosis not present

## 2014-06-13 DIAGNOSIS — R262 Difficulty in walking, not elsewhere classified: Secondary | ICD-10-CM | POA: Diagnosis not present

## 2014-06-16 DIAGNOSIS — M6281 Muscle weakness (generalized): Secondary | ICD-10-CM | POA: Diagnosis not present

## 2014-06-16 DIAGNOSIS — R262 Difficulty in walking, not elsewhere classified: Secondary | ICD-10-CM | POA: Diagnosis not present

## 2014-06-16 DIAGNOSIS — Z5189 Encounter for other specified aftercare: Secondary | ICD-10-CM | POA: Diagnosis not present

## 2014-06-20 DIAGNOSIS — Z5189 Encounter for other specified aftercare: Secondary | ICD-10-CM | POA: Diagnosis not present

## 2014-06-20 DIAGNOSIS — M6281 Muscle weakness (generalized): Secondary | ICD-10-CM | POA: Diagnosis not present

## 2014-06-20 DIAGNOSIS — R262 Difficulty in walking, not elsewhere classified: Secondary | ICD-10-CM | POA: Diagnosis not present

## 2014-06-21 DIAGNOSIS — Z5189 Encounter for other specified aftercare: Secondary | ICD-10-CM | POA: Diagnosis not present

## 2014-06-21 DIAGNOSIS — R262 Difficulty in walking, not elsewhere classified: Secondary | ICD-10-CM | POA: Diagnosis not present

## 2014-06-21 DIAGNOSIS — M6281 Muscle weakness (generalized): Secondary | ICD-10-CM | POA: Diagnosis not present

## 2014-06-22 DIAGNOSIS — M25549 Pain in joints of unspecified hand: Secondary | ICD-10-CM | POA: Diagnosis not present

## 2014-06-22 DIAGNOSIS — E441 Mild protein-calorie malnutrition: Secondary | ICD-10-CM | POA: Diagnosis not present

## 2014-06-22 DIAGNOSIS — IMO0002 Reserved for concepts with insufficient information to code with codable children: Secondary | ICD-10-CM | POA: Diagnosis not present

## 2014-06-22 DIAGNOSIS — M25539 Pain in unspecified wrist: Secondary | ICD-10-CM | POA: Diagnosis not present

## 2014-06-23 DIAGNOSIS — Z5189 Encounter for other specified aftercare: Secondary | ICD-10-CM | POA: Diagnosis not present

## 2014-06-23 DIAGNOSIS — M6281 Muscle weakness (generalized): Secondary | ICD-10-CM | POA: Diagnosis not present

## 2014-06-23 DIAGNOSIS — R262 Difficulty in walking, not elsewhere classified: Secondary | ICD-10-CM | POA: Diagnosis not present

## 2014-06-24 DIAGNOSIS — Z5189 Encounter for other specified aftercare: Secondary | ICD-10-CM | POA: Diagnosis not present

## 2014-06-24 DIAGNOSIS — R262 Difficulty in walking, not elsewhere classified: Secondary | ICD-10-CM | POA: Diagnosis not present

## 2014-06-24 DIAGNOSIS — M6281 Muscle weakness (generalized): Secondary | ICD-10-CM | POA: Diagnosis not present

## 2014-06-25 DIAGNOSIS — Z5189 Encounter for other specified aftercare: Secondary | ICD-10-CM | POA: Diagnosis not present

## 2014-06-25 DIAGNOSIS — M6281 Muscle weakness (generalized): Secondary | ICD-10-CM | POA: Diagnosis not present

## 2014-06-25 DIAGNOSIS — R262 Difficulty in walking, not elsewhere classified: Secondary | ICD-10-CM | POA: Diagnosis not present

## 2014-06-27 DIAGNOSIS — F0781 Postconcussional syndrome: Secondary | ICD-10-CM | POA: Diagnosis not present

## 2014-06-27 DIAGNOSIS — G44221 Chronic tension-type headache, intractable: Secondary | ICD-10-CM | POA: Diagnosis not present

## 2014-06-27 DIAGNOSIS — F0280 Dementia in other diseases classified elsewhere without behavioral disturbance: Secondary | ICD-10-CM | POA: Diagnosis not present

## 2014-06-28 DIAGNOSIS — M6281 Muscle weakness (generalized): Secondary | ICD-10-CM | POA: Diagnosis not present

## 2014-06-28 DIAGNOSIS — Z5189 Encounter for other specified aftercare: Secondary | ICD-10-CM | POA: Diagnosis not present

## 2014-06-28 DIAGNOSIS — R262 Difficulty in walking, not elsewhere classified: Secondary | ICD-10-CM | POA: Diagnosis not present

## 2014-06-29 DIAGNOSIS — Z5189 Encounter for other specified aftercare: Secondary | ICD-10-CM | POA: Diagnosis not present

## 2014-06-29 DIAGNOSIS — M6281 Muscle weakness (generalized): Secondary | ICD-10-CM | POA: Diagnosis not present

## 2014-06-29 DIAGNOSIS — R262 Difficulty in walking, not elsewhere classified: Secondary | ICD-10-CM | POA: Diagnosis not present

## 2014-06-30 DIAGNOSIS — G44221 Chronic tension-type headache, intractable: Secondary | ICD-10-CM | POA: Diagnosis not present

## 2014-06-30 DIAGNOSIS — Z5189 Encounter for other specified aftercare: Secondary | ICD-10-CM | POA: Diagnosis not present

## 2014-06-30 DIAGNOSIS — M6281 Muscle weakness (generalized): Secondary | ICD-10-CM | POA: Diagnosis not present

## 2014-06-30 DIAGNOSIS — R262 Difficulty in walking, not elsewhere classified: Secondary | ICD-10-CM | POA: Diagnosis not present

## 2014-07-01 DIAGNOSIS — M6281 Muscle weakness (generalized): Secondary | ICD-10-CM | POA: Diagnosis not present

## 2014-07-01 DIAGNOSIS — Z5189 Encounter for other specified aftercare: Secondary | ICD-10-CM | POA: Diagnosis not present

## 2014-07-01 DIAGNOSIS — R262 Difficulty in walking, not elsewhere classified: Secondary | ICD-10-CM | POA: Diagnosis not present

## 2014-07-04 DIAGNOSIS — M6281 Muscle weakness (generalized): Secondary | ICD-10-CM | POA: Diagnosis not present

## 2014-07-04 DIAGNOSIS — Z5189 Encounter for other specified aftercare: Secondary | ICD-10-CM | POA: Diagnosis not present

## 2014-07-04 DIAGNOSIS — R262 Difficulty in walking, not elsewhere classified: Secondary | ICD-10-CM | POA: Diagnosis not present

## 2014-07-06 ENCOUNTER — Ambulatory Visit (INDEPENDENT_AMBULATORY_CARE_PROVIDER_SITE_OTHER): Payer: Medicare Other | Admitting: Pharmacist Clinician (PhC)/ Clinical Pharmacy Specialist

## 2014-07-06 VITALS — BP 180/72 | HR 68 | Ht 67.0 in | Wt 200.0 lb

## 2014-07-06 DIAGNOSIS — I2510 Atherosclerotic heart disease of native coronary artery without angina pectoris: Secondary | ICD-10-CM | POA: Diagnosis not present

## 2014-07-06 DIAGNOSIS — I10 Essential (primary) hypertension: Secondary | ICD-10-CM | POA: Diagnosis not present

## 2014-07-06 NOTE — Patient Instructions (Signed)
Call if you see BP rising consistently above 150/90  Your blood pressure today is 180/72   Check your blood pressure at home several days per week and keep record of the readings.  Bring all of your meds, your BP cuff and your record of home blood pressures to your next appointment.  Exercise as you're able, try to walk approximately 30 minutes per day.  Keep salt intake to a minimum, especially watch canned and prepared boxed foods.  Eat more fresh fruits and vegetables and fewer canned items.  Avoid eating in fast food restaurants.    HOW TO TAKE YOUR BLOOD PRESSURE:   Rest 5 minutes before taking your blood pressure.    Don't smoke or drink caffeinated beverages for at least 30 minutes before.   Take your blood pressure before (not after) you eat.   Sit comfortably with your back supported and both feet on the floor (don't cross your legs).   Elevate your arm to heart level on a table or a desk.   Use the proper sized cuff. It should fit smoothly and snugly around your bare upper arm. There should be enough room to slip a fingertip under the cuff. The bottom edge of the cuff should be 1 inch above the crease of the elbow.   Ideally, take 3 measurements at one sitting and record the average.

## 2014-07-07 DIAGNOSIS — R262 Difficulty in walking, not elsewhere classified: Secondary | ICD-10-CM | POA: Diagnosis not present

## 2014-07-07 DIAGNOSIS — M6281 Muscle weakness (generalized): Secondary | ICD-10-CM | POA: Diagnosis not present

## 2014-07-07 DIAGNOSIS — Z5189 Encounter for other specified aftercare: Secondary | ICD-10-CM | POA: Diagnosis not present

## 2014-07-07 NOTE — Progress Notes (Signed)
07/07/2014 Gavin [name] 02-24-1929 [id number]   HPI:  Gavin [name] is a 80 y.o. male patient of Dr. [name], with a PMH below who presents today for a blood pressure check.  He was a previous anticoagulation patient, during those past visits his blood pressure was fairly well controlled on medication.  I saw him initially because of a concern with home therapy that his BP and HR may be dropping too low.  He has periods of dizziness that often lead to falls, their concern is that it may be related to either of these.  At that time home readings were between 831-517 systolic, and while most of the diastolic readings were WNL, although a few as high as 100.   They did not record any heart rates.   He is not a current smoker, having quit about 40 years ago and does not drink alcohol.  He is unable to do regular exercise because of unsteady gait and fall risk.   His biggest complaint is in regards to constant underlying headaches, which he has had for many years without relief.   At one time we discontinued isosorbide, but there was no change, so it was restarted.    Currently he takes only one medication that affects his blood pressure, metoprolol 50mg  bid.  He had been on amlodipine 5mg , enalapril 20mg  and furosemide prn in the past.  They were all discontinued by his primary care physician.    Today he comes in with a list of home BP readings that have run from 138-158/64-96.  For the last 3-4 days he has been running 616-073 systolic.  His wife reports that he is not sleeping well, up for hours in the middle of the night in what she describes as a trance or zone.  If he is asleep, he is having vivid dreams, talking loudly and yelling.  He looks quite tired in the office today.  She states that about 2-3 weeks ago he was started on quetiapine and was doing rather well, but they misplaced the bottle and he hasn't had any for about 5 days.  She tried to refill at pharmacy but was refused for early  refill.  She has decided to pay cash for some tablets to get them by until it is refillable, as neither of them are getting much rest.     Current Outpatient Prescriptions  Medication Sig Dispense Refill  . QUEtiapine (SEROQUEL) 25 MG tablet Take 50 mg by mouth at bedtime.      . traMADol (ULTRAM) 50 MG tablet Take 1 tablet by mouth as needed.      Marland Kitchen acetaminophen (TYLENOL) 325 MG tablet Take 2 tablets (650 mg total) by mouth every 4 (four) hours as needed.      Marland Kitchen aspirin 81 MG tablet Take 81 mg by mouth daily.      Marland Kitchen atorvastatin (LIPITOR) 40 MG tablet take 1 tablet by mouth at bedtime  31 tablet  11  . cetirizine (ZYRTEC) 10 MG tablet Take 10 mg by mouth daily.      . Cholecalciferol (VITAMIN D) 2000 UNITS CAPS Take 1 capsule by mouth daily.      . cyclobenzaprine (FLEXERIL) 5 MG tablet Take 5 mg by mouth at bedtime as needed.       . diclofenac sodium (VOLTAREN) 1 % GEL Apply 2 g topically daily as needed (pain).      Marland Kitchen donepezil (ARICEPT) 5 MG tablet Take 5 mg by  mouth at bedtime.      . DULoxetine (CYMBALTA) 60 MG capsule Take 60 mg by mouth daily.      Marland Kitchen Fesoterodine Fumarate (TOVIAZ) 8 MG TB24 Take 8 mg by mouth daily.      . isosorbide mononitrate (IMDUR) 30 MG 24 hr tablet Take 30 mg by mouth 2 (two) times daily.      . Memantine HCl ER (NAMENDA XR) 28 MG CP24 Take 1 capsule by mouth daily.      . metoprolol (LOPRESSOR) 50 MG tablet Take 50 mg by mouth 2 (two) times daily.      . Misc Natural Products (LUTEIN VISION BLEND PO) Take 1 tablet by mouth 2 (two) times daily.      . Multiple Vitamin (MULTIVITAMIN) tablet Take 1 tablet by mouth daily.      Marland Kitchen NASONEX 50 MCG/ACT nasal spray Place 2 sprays into the nose 2 (two) times daily.       . nitroGLYCERIN (NITROSTAT) 0.4 MG SL tablet Place 1 tablet (0.4 mg total) under the tongue every 5 (five) minutes as needed.  25 tablet  2  . pantoprazole (PROTONIX) 40 MG tablet take 1 tablet by mouth once daily  30 tablet  6  . RANEXA 500 MG 12 hr  tablet take 1 tablet by mouth twice a day  60 tablet  9  . sertraline (ZOLOFT) 50 MG tablet Take 50 mg by mouth every morning.      . vitamin B-12 (CYANOCOBALAMIN) 1000 MCG tablet Take 1,000 mcg by mouth daily.      Marland Kitchen zonisamide (ZONEGRAN) 100 MG capsule Take 100 mg by mouth at bedtime.       No current facility-administered medications for this visit.    Allergies  Allergen Reactions  . Ciprofloxacin     unknown  . Codeine     "knocked me out"  . Dexamethasone     unknown  . Sulfonamide Derivatives     Unknown reaction  . Telmisartan     unknown    Past Medical History  Diagnosis Date  . OSA (obstructive sleep apnea)     NPSG 05-05-99 RDI 65/hr-CPAP  . Chronic airway obstruction, not elsewhere classified   . Lung nodules     CT 07-01-10 stable @ [place]  . CAD (coronary artery disease)     Hx of multiple PCIs   . Reflux esophagitis   . GERD (gastroesophageal reflux disease)   . Hypertension   . Hyperlipidemia   . PVD (peripheral vascular disease) 2004    stenting of both SFA's 2004  . Carotid disease, bilateral     left greater than right  . STEMI (ST elevation myocardial infarction) 2007    stent to LAD  . Headache     negative work up, no change off Imdur    Blood pressure 180/72, pulse 68, height 5\' 7"  (1.702 m), weight 200 lb (90.719 kg).   ASSESSMENT AND PLAN:  Tommy Medal PharmD CPP [facility]

## 2014-07-07 NOTE — Assessment & Plan Note (Addendum)
Although his BP is quite elevated today, I believe it is due to his poor sleep over the past several days.  His wife is going to get him restarted on the Seroquel ASAP.  She will continue to check his home BP readings and I suspect they will drop back down to his normal range.  If his pressures don't drop, she will give [place] a call and we can possibly restart him on a low dose of enalapril or amlodipine.

## 2014-07-08 DIAGNOSIS — R262 Difficulty in walking, not elsewhere classified: Secondary | ICD-10-CM | POA: Diagnosis not present

## 2014-07-08 DIAGNOSIS — Z5189 Encounter for other specified aftercare: Secondary | ICD-10-CM | POA: Diagnosis not present

## 2014-07-08 DIAGNOSIS — M6281 Muscle weakness (generalized): Secondary | ICD-10-CM | POA: Diagnosis not present

## 2014-07-09 DIAGNOSIS — J309 Allergic rhinitis, unspecified: Secondary | ICD-10-CM | POA: Diagnosis not present

## 2014-07-09 DIAGNOSIS — J0100 Acute maxillary sinusitis, unspecified: Secondary | ICD-10-CM | POA: Diagnosis not present

## 2014-07-09 DIAGNOSIS — M6281 Muscle weakness (generalized): Secondary | ICD-10-CM | POA: Diagnosis not present

## 2014-07-09 DIAGNOSIS — R262 Difficulty in walking, not elsewhere classified: Secondary | ICD-10-CM | POA: Diagnosis not present

## 2014-07-09 DIAGNOSIS — Z5189 Encounter for other specified aftercare: Secondary | ICD-10-CM | POA: Diagnosis not present

## 2014-07-09 DIAGNOSIS — F0280 Dementia in other diseases classified elsewhere without behavioral disturbance: Secondary | ICD-10-CM | POA: Diagnosis not present

## 2014-07-13 DIAGNOSIS — I739 Peripheral vascular disease, unspecified: Secondary | ICD-10-CM | POA: Diagnosis not present

## 2014-07-13 DIAGNOSIS — B351 Tinea unguium: Secondary | ICD-10-CM | POA: Diagnosis not present

## 2014-07-28 DIAGNOSIS — F0781 Postconcussional syndrome: Secondary | ICD-10-CM | POA: Diagnosis not present

## 2014-08-10 DIAGNOSIS — F0781 Postconcussional syndrome: Secondary | ICD-10-CM | POA: Diagnosis not present

## 2014-08-10 DIAGNOSIS — M6281 Muscle weakness (generalized): Secondary | ICD-10-CM | POA: Diagnosis not present

## 2014-08-10 DIAGNOSIS — R2689 Other abnormalities of gait and mobility: Secondary | ICD-10-CM | POA: Diagnosis not present

## 2014-08-10 DIAGNOSIS — G4489 Other headache syndrome: Secondary | ICD-10-CM | POA: Diagnosis not present

## 2014-08-10 DIAGNOSIS — G8929 Other chronic pain: Secondary | ICD-10-CM | POA: Diagnosis not present

## 2014-08-17 DIAGNOSIS — J159 Unspecified bacterial pneumonia: Secondary | ICD-10-CM | POA: Diagnosis not present

## 2014-08-17 DIAGNOSIS — S20212A Contusion of left front wall of thorax, initial encounter: Secondary | ICD-10-CM | POA: Diagnosis not present

## 2014-08-17 DIAGNOSIS — R062 Wheezing: Secondary | ICD-10-CM | POA: Diagnosis not present

## 2014-08-19 DIAGNOSIS — F0781 Postconcussional syndrome: Secondary | ICD-10-CM | POA: Diagnosis not present

## 2014-08-19 DIAGNOSIS — M6281 Muscle weakness (generalized): Secondary | ICD-10-CM | POA: Diagnosis not present

## 2014-08-19 DIAGNOSIS — G8929 Other chronic pain: Secondary | ICD-10-CM | POA: Diagnosis not present

## 2014-08-19 DIAGNOSIS — R2689 Other abnormalities of gait and mobility: Secondary | ICD-10-CM | POA: Diagnosis not present

## 2014-08-19 DIAGNOSIS — G4489 Other headache syndrome: Secondary | ICD-10-CM | POA: Diagnosis not present

## 2014-08-23 DIAGNOSIS — G8929 Other chronic pain: Secondary | ICD-10-CM | POA: Diagnosis not present

## 2014-08-23 DIAGNOSIS — R2689 Other abnormalities of gait and mobility: Secondary | ICD-10-CM | POA: Diagnosis not present

## 2014-08-23 DIAGNOSIS — M6281 Muscle weakness (generalized): Secondary | ICD-10-CM | POA: Diagnosis not present

## 2014-08-23 DIAGNOSIS — G4489 Other headache syndrome: Secondary | ICD-10-CM | POA: Diagnosis not present

## 2014-08-23 DIAGNOSIS — F0781 Postconcussional syndrome: Secondary | ICD-10-CM | POA: Diagnosis not present

## 2014-08-29 DIAGNOSIS — G301 Alzheimer's disease with late onset: Secondary | ICD-10-CM | POA: Diagnosis not present

## 2014-08-29 DIAGNOSIS — F0281 Dementia in other diseases classified elsewhere with behavioral disturbance: Secondary | ICD-10-CM | POA: Diagnosis not present

## 2014-08-29 DIAGNOSIS — F0781 Postconcussional syndrome: Secondary | ICD-10-CM | POA: Diagnosis not present

## 2014-09-01 ENCOUNTER — Encounter (HOSPITAL_COMMUNITY): Payer: Self-pay | Admitting: Cardiology

## 2014-09-01 DIAGNOSIS — G44221 Chronic tension-type headache, intractable: Secondary | ICD-10-CM | POA: Diagnosis not present

## 2014-09-01 DIAGNOSIS — R413 Other amnesia: Secondary | ICD-10-CM | POA: Diagnosis not present

## 2014-09-14 DIAGNOSIS — M79675 Pain in left toe(s): Secondary | ICD-10-CM | POA: Diagnosis not present

## 2014-09-15 ENCOUNTER — Other Ambulatory Visit: Payer: Self-pay | Admitting: Cardiovascular Disease

## 2014-09-19 DIAGNOSIS — H26493 Other secondary cataract, bilateral: Secondary | ICD-10-CM | POA: Diagnosis not present

## 2014-09-19 NOTE — Telephone Encounter (Signed)
Rx refill sent to patient pharmacy  Spoke with patient who states he takes Lasix on a PRN basis Rx was taken off med list in September 2015.

## 2014-09-25 DIAGNOSIS — I10 Essential (primary) hypertension: Secondary | ICD-10-CM | POA: Diagnosis not present

## 2014-09-25 DIAGNOSIS — E780 Pure hypercholesterolemia: Secondary | ICD-10-CM | POA: Diagnosis not present

## 2014-09-25 DIAGNOSIS — X58XXXA Exposure to other specified factors, initial encounter: Secondary | ICD-10-CM | POA: Diagnosis not present

## 2014-09-25 DIAGNOSIS — S46002A Unspecified injury of muscle(s) and tendon(s) of the rotator cuff of left shoulder, initial encounter: Secondary | ICD-10-CM | POA: Diagnosis not present

## 2014-09-25 DIAGNOSIS — R531 Weakness: Secondary | ICD-10-CM | POA: Diagnosis not present

## 2014-09-25 DIAGNOSIS — R404 Transient alteration of awareness: Secondary | ICD-10-CM | POA: Diagnosis not present

## 2014-09-25 DIAGNOSIS — M79602 Pain in left arm: Secondary | ICD-10-CM | POA: Diagnosis not present

## 2014-09-26 ENCOUNTER — Other Ambulatory Visit: Payer: Self-pay | Admitting: Cardiovascular Disease

## 2014-09-28 ENCOUNTER — Other Ambulatory Visit: Payer: Self-pay | Admitting: Cardiovascular Disease

## 2014-09-28 ENCOUNTER — Telehealth (HOSPITAL_COMMUNITY): Payer: Self-pay | Admitting: *Deleted

## 2014-09-28 DIAGNOSIS — M6281 Muscle weakness (generalized): Secondary | ICD-10-CM | POA: Diagnosis not present

## 2014-09-28 DIAGNOSIS — G4489 Other headache syndrome: Secondary | ICD-10-CM | POA: Diagnosis not present

## 2014-09-28 DIAGNOSIS — F0781 Postconcussional syndrome: Secondary | ICD-10-CM | POA: Diagnosis not present

## 2014-09-28 DIAGNOSIS — G8929 Other chronic pain: Secondary | ICD-10-CM | POA: Diagnosis not present

## 2014-09-28 DIAGNOSIS — R2689 Other abnormalities of gait and mobility: Secondary | ICD-10-CM | POA: Diagnosis not present

## 2014-09-30 DIAGNOSIS — G8929 Other chronic pain: Secondary | ICD-10-CM | POA: Diagnosis not present

## 2014-09-30 DIAGNOSIS — G4489 Other headache syndrome: Secondary | ICD-10-CM | POA: Diagnosis not present

## 2014-09-30 DIAGNOSIS — F0781 Postconcussional syndrome: Secondary | ICD-10-CM | POA: Diagnosis not present

## 2014-09-30 DIAGNOSIS — M6281 Muscle weakness (generalized): Secondary | ICD-10-CM | POA: Diagnosis not present

## 2014-09-30 DIAGNOSIS — R2689 Other abnormalities of gait and mobility: Secondary | ICD-10-CM | POA: Diagnosis not present

## 2014-10-05 DIAGNOSIS — T887XXA Unspecified adverse effect of drug or medicament, initial encounter: Secondary | ICD-10-CM | POA: Diagnosis not present

## 2014-10-05 DIAGNOSIS — S0990XA Unspecified injury of head, initial encounter: Secondary | ICD-10-CM | POA: Diagnosis not present

## 2014-10-05 DIAGNOSIS — T148 Other injury of unspecified body region: Secondary | ICD-10-CM | POA: Diagnosis not present

## 2014-10-05 DIAGNOSIS — R55 Syncope and collapse: Secondary | ICD-10-CM | POA: Diagnosis not present

## 2014-10-05 DIAGNOSIS — S299XXA Unspecified injury of thorax, initial encounter: Secondary | ICD-10-CM | POA: Diagnosis not present

## 2014-10-05 DIAGNOSIS — S79912A Unspecified injury of left hip, initial encounter: Secondary | ICD-10-CM | POA: Diagnosis not present

## 2014-10-05 DIAGNOSIS — M79605 Pain in left leg: Secondary | ICD-10-CM | POA: Diagnosis not present

## 2014-10-05 DIAGNOSIS — S79911A Unspecified injury of right hip, initial encounter: Secondary | ICD-10-CM | POA: Diagnosis not present

## 2014-10-05 DIAGNOSIS — F0781 Postconcussional syndrome: Secondary | ICD-10-CM | POA: Diagnosis not present

## 2014-10-05 DIAGNOSIS — M25512 Pain in left shoulder: Secondary | ICD-10-CM | POA: Diagnosis not present

## 2014-10-05 DIAGNOSIS — S199XXA Unspecified injury of neck, initial encounter: Secondary | ICD-10-CM | POA: Diagnosis not present

## 2014-10-05 DIAGNOSIS — M542 Cervicalgia: Secondary | ICD-10-CM | POA: Diagnosis not present

## 2014-10-05 DIAGNOSIS — N189 Chronic kidney disease, unspecified: Secondary | ICD-10-CM | POA: Diagnosis not present

## 2014-10-05 DIAGNOSIS — I129 Hypertensive chronic kidney disease with stage 1 through stage 4 chronic kidney disease, or unspecified chronic kidney disease: Secondary | ICD-10-CM | POA: Diagnosis not present

## 2014-10-05 DIAGNOSIS — S79922A Unspecified injury of left thigh, initial encounter: Secondary | ICD-10-CM | POA: Diagnosis not present

## 2014-10-05 DIAGNOSIS — E871 Hypo-osmolality and hyponatremia: Secondary | ICD-10-CM | POA: Diagnosis not present

## 2014-10-05 DIAGNOSIS — M545 Low back pain: Secondary | ICD-10-CM | POA: Diagnosis not present

## 2014-10-06 DIAGNOSIS — I351 Nonrheumatic aortic (valve) insufficiency: Secondary | ICD-10-CM | POA: Diagnosis not present

## 2014-10-06 DIAGNOSIS — F328 Other depressive episodes: Secondary | ICD-10-CM | POA: Diagnosis not present

## 2014-10-06 DIAGNOSIS — M542 Cervicalgia: Secondary | ICD-10-CM | POA: Diagnosis not present

## 2014-10-06 DIAGNOSIS — I517 Cardiomegaly: Secondary | ICD-10-CM | POA: Diagnosis not present

## 2014-10-06 DIAGNOSIS — F0151 Vascular dementia with behavioral disturbance: Secondary | ICD-10-CM | POA: Diagnosis not present

## 2014-10-06 DIAGNOSIS — I509 Heart failure, unspecified: Secondary | ICD-10-CM | POA: Diagnosis not present

## 2014-10-06 DIAGNOSIS — Z9181 History of falling: Secondary | ICD-10-CM | POA: Diagnosis not present

## 2014-10-06 DIAGNOSIS — I4891 Unspecified atrial fibrillation: Secondary | ICD-10-CM | POA: Diagnosis not present

## 2014-10-06 DIAGNOSIS — M6282 Rhabdomyolysis: Secondary | ICD-10-CM | POA: Diagnosis not present

## 2014-10-06 DIAGNOSIS — M79605 Pain in left leg: Secondary | ICD-10-CM | POA: Diagnosis not present

## 2014-10-06 DIAGNOSIS — R778 Other specified abnormalities of plasma proteins: Secondary | ICD-10-CM | POA: Diagnosis not present

## 2014-10-06 DIAGNOSIS — S199XXA Unspecified injury of neck, initial encounter: Secondary | ICD-10-CM | POA: Diagnosis not present

## 2014-10-06 DIAGNOSIS — G92 Toxic encephalopathy: Secondary | ICD-10-CM | POA: Diagnosis not present

## 2014-10-06 DIAGNOSIS — R9431 Abnormal electrocardiogram [ECG] [EKG]: Secondary | ICD-10-CM | POA: Diagnosis not present

## 2014-10-06 DIAGNOSIS — M129 Arthropathy, unspecified: Secondary | ICD-10-CM | POA: Diagnosis not present

## 2014-10-06 DIAGNOSIS — N189 Chronic kidney disease, unspecified: Secondary | ICD-10-CM | POA: Diagnosis not present

## 2014-10-06 DIAGNOSIS — E780 Pure hypercholesterolemia: Secondary | ICD-10-CM | POA: Diagnosis not present

## 2014-10-06 DIAGNOSIS — S0990XA Unspecified injury of head, initial encounter: Secondary | ICD-10-CM | POA: Diagnosis not present

## 2014-10-06 DIAGNOSIS — I2510 Atherosclerotic heart disease of native coronary artery without angina pectoris: Secondary | ICD-10-CM | POA: Diagnosis not present

## 2014-10-06 DIAGNOSIS — R4182 Altered mental status, unspecified: Secondary | ICD-10-CM | POA: Diagnosis not present

## 2014-10-06 DIAGNOSIS — Z7982 Long term (current) use of aspirin: Secondary | ICD-10-CM | POA: Diagnosis not present

## 2014-10-06 DIAGNOSIS — I10 Essential (primary) hypertension: Secondary | ICD-10-CM | POA: Diagnosis not present

## 2014-10-06 DIAGNOSIS — E871 Hypo-osmolality and hyponatremia: Secondary | ICD-10-CM | POA: Diagnosis not present

## 2014-10-06 DIAGNOSIS — I361 Nonrheumatic tricuspid (valve) insufficiency: Secondary | ICD-10-CM | POA: Diagnosis not present

## 2014-10-06 DIAGNOSIS — Z888 Allergy status to other drugs, medicaments and biological substances status: Secondary | ICD-10-CM | POA: Diagnosis not present

## 2014-10-06 DIAGNOSIS — R7989 Other specified abnormal findings of blood chemistry: Secondary | ICD-10-CM | POA: Diagnosis not present

## 2014-10-06 DIAGNOSIS — Z882 Allergy status to sulfonamides status: Secondary | ICD-10-CM | POA: Diagnosis not present

## 2014-10-06 DIAGNOSIS — S79922A Unspecified injury of left thigh, initial encounter: Secondary | ICD-10-CM | POA: Diagnosis not present

## 2014-10-06 DIAGNOSIS — F29 Unspecified psychosis not due to a substance or known physiological condition: Secondary | ICD-10-CM | POA: Diagnosis not present

## 2014-10-06 DIAGNOSIS — S79911A Unspecified injury of right hip, initial encounter: Secondary | ICD-10-CM | POA: Diagnosis not present

## 2014-10-06 DIAGNOSIS — Z881 Allergy status to other antibiotic agents status: Secondary | ICD-10-CM | POA: Diagnosis not present

## 2014-10-06 DIAGNOSIS — I340 Nonrheumatic mitral (valve) insufficiency: Secondary | ICD-10-CM | POA: Diagnosis not present

## 2014-10-06 DIAGNOSIS — S79912A Unspecified injury of left hip, initial encounter: Secondary | ICD-10-CM | POA: Diagnosis not present

## 2014-10-06 DIAGNOSIS — R55 Syncope and collapse: Secondary | ICD-10-CM | POA: Diagnosis present

## 2014-10-06 DIAGNOSIS — S299XXA Unspecified injury of thorax, initial encounter: Secondary | ICD-10-CM | POA: Diagnosis not present

## 2014-10-06 DIAGNOSIS — I129 Hypertensive chronic kidney disease with stage 1 through stage 4 chronic kidney disease, or unspecified chronic kidney disease: Secondary | ICD-10-CM | POA: Diagnosis not present

## 2014-10-06 DIAGNOSIS — I252 Old myocardial infarction: Secondary | ICD-10-CM | POA: Diagnosis not present

## 2014-10-06 DIAGNOSIS — Z885 Allergy status to narcotic agent status: Secondary | ICD-10-CM | POA: Diagnosis not present

## 2014-10-06 DIAGNOSIS — K219 Gastro-esophageal reflux disease without esophagitis: Secondary | ICD-10-CM | POA: Diagnosis not present

## 2014-10-06 NOTE — Telephone Encounter (Signed)
[facility] requesting recent med list for this patient.  Fax: [phone]  Sent last OV note w/ meds.

## 2014-10-10 DIAGNOSIS — E780 Pure hypercholesterolemia: Secondary | ICD-10-CM | POA: Diagnosis not present

## 2014-10-10 DIAGNOSIS — R7989 Other specified abnormal findings of blood chemistry: Secondary | ICD-10-CM | POA: Diagnosis not present

## 2014-10-10 DIAGNOSIS — I10 Essential (primary) hypertension: Secondary | ICD-10-CM | POA: Diagnosis not present

## 2014-10-10 DIAGNOSIS — M6282 Rhabdomyolysis: Secondary | ICD-10-CM | POA: Diagnosis not present

## 2014-10-10 DIAGNOSIS — R110 Nausea: Secondary | ICD-10-CM | POA: Diagnosis not present

## 2014-10-10 DIAGNOSIS — F29 Unspecified psychosis not due to a substance or known physiological condition: Secondary | ICD-10-CM | POA: Diagnosis not present

## 2014-10-10 DIAGNOSIS — K219 Gastro-esophageal reflux disease without esophagitis: Secondary | ICD-10-CM | POA: Diagnosis not present

## 2014-10-10 DIAGNOSIS — I119 Hypertensive heart disease without heart failure: Secondary | ICD-10-CM | POA: Diagnosis not present

## 2014-10-10 DIAGNOSIS — R55 Syncope and collapse: Secondary | ICD-10-CM | POA: Diagnosis not present

## 2014-10-10 DIAGNOSIS — F328 Other depressive episodes: Secondary | ICD-10-CM | POA: Diagnosis not present

## 2014-10-10 DIAGNOSIS — R778 Other specified abnormalities of plasma proteins: Secondary | ICD-10-CM | POA: Diagnosis not present

## 2014-10-10 DIAGNOSIS — R262 Difficulty in walking, not elsewhere classified: Secondary | ICD-10-CM | POA: Diagnosis not present

## 2014-10-10 DIAGNOSIS — F0151 Vascular dementia with behavioral disturbance: Secondary | ICD-10-CM | POA: Diagnosis not present

## 2014-10-10 DIAGNOSIS — R109 Unspecified abdominal pain: Secondary | ICD-10-CM | POA: Diagnosis not present

## 2014-10-10 DIAGNOSIS — I2510 Atherosclerotic heart disease of native coronary artery without angina pectoris: Secondary | ICD-10-CM | POA: Diagnosis not present

## 2014-10-10 DIAGNOSIS — I252 Old myocardial infarction: Secondary | ICD-10-CM | POA: Diagnosis not present

## 2014-10-10 DIAGNOSIS — M129 Arthropathy, unspecified: Secondary | ICD-10-CM | POA: Diagnosis not present

## 2014-10-10 DIAGNOSIS — I4891 Unspecified atrial fibrillation: Secondary | ICD-10-CM | POA: Diagnosis not present

## 2014-10-10 DIAGNOSIS — I509 Heart failure, unspecified: Secondary | ICD-10-CM | POA: Diagnosis not present

## 2014-10-10 DIAGNOSIS — Z9181 History of falling: Secondary | ICD-10-CM | POA: Diagnosis not present

## 2014-10-10 DIAGNOSIS — G92 Toxic encephalopathy: Secondary | ICD-10-CM | POA: Diagnosis not present

## 2014-10-10 DIAGNOSIS — K5900 Constipation, unspecified: Secondary | ICD-10-CM | POA: Diagnosis not present

## 2014-10-10 DIAGNOSIS — F0390 Unspecified dementia without behavioral disturbance: Secondary | ICD-10-CM | POA: Diagnosis not present

## 2014-10-12 DIAGNOSIS — I2510 Atherosclerotic heart disease of native coronary artery without angina pectoris: Secondary | ICD-10-CM | POA: Diagnosis not present

## 2014-10-12 DIAGNOSIS — I119 Hypertensive heart disease without heart failure: Secondary | ICD-10-CM | POA: Diagnosis not present

## 2014-10-12 DIAGNOSIS — R262 Difficulty in walking, not elsewhere classified: Secondary | ICD-10-CM | POA: Diagnosis not present

## 2014-10-12 DIAGNOSIS — F0390 Unspecified dementia without behavioral disturbance: Secondary | ICD-10-CM | POA: Diagnosis not present

## 2014-10-21 ENCOUNTER — Other Ambulatory Visit (HOSPITAL_COMMUNITY): Payer: Self-pay | Admitting: Cardiovascular Disease

## 2014-10-21 DIAGNOSIS — I6529 Occlusion and stenosis of unspecified carotid artery: Secondary | ICD-10-CM

## 2014-10-31 ENCOUNTER — Encounter (HOSPITAL_COMMUNITY): Payer: Medicare Other

## 2014-10-31 DIAGNOSIS — F0280 Dementia in other diseases classified elsewhere without behavioral disturbance: Secondary | ICD-10-CM | POA: Diagnosis not present

## 2014-10-31 DIAGNOSIS — F0781 Postconcussional syndrome: Secondary | ICD-10-CM | POA: Diagnosis not present

## 2014-10-31 DIAGNOSIS — G301 Alzheimer's disease with late onset: Secondary | ICD-10-CM | POA: Diagnosis not present

## 2014-10-31 DIAGNOSIS — T796XXS Traumatic ischemia of muscle, sequela: Secondary | ICD-10-CM | POA: Diagnosis not present

## 2014-11-01 ENCOUNTER — Other Ambulatory Visit (HOSPITAL_COMMUNITY): Payer: Self-pay | Admitting: Cardiovascular Disease

## 2014-11-01 DIAGNOSIS — G8929 Other chronic pain: Secondary | ICD-10-CM | POA: Diagnosis not present

## 2014-11-01 DIAGNOSIS — R2689 Other abnormalities of gait and mobility: Secondary | ICD-10-CM | POA: Diagnosis not present

## 2014-11-01 DIAGNOSIS — R41841 Cognitive communication deficit: Secondary | ICD-10-CM | POA: Diagnosis not present

## 2014-11-01 DIAGNOSIS — F0781 Postconcussional syndrome: Secondary | ICD-10-CM | POA: Diagnosis not present

## 2014-11-01 DIAGNOSIS — M6281 Muscle weakness (generalized): Secondary | ICD-10-CM | POA: Diagnosis not present

## 2014-11-01 DIAGNOSIS — G4489 Other headache syndrome: Secondary | ICD-10-CM | POA: Diagnosis not present

## 2014-11-02 NOTE — Telephone Encounter (Signed)
Rx refill sent to patient pharmacy   

## 2014-11-08 DIAGNOSIS — M6281 Muscle weakness (generalized): Secondary | ICD-10-CM | POA: Diagnosis not present

## 2014-11-08 DIAGNOSIS — R2689 Other abnormalities of gait and mobility: Secondary | ICD-10-CM | POA: Diagnosis not present

## 2014-11-08 DIAGNOSIS — G8929 Other chronic pain: Secondary | ICD-10-CM | POA: Diagnosis not present

## 2014-11-08 DIAGNOSIS — G4489 Other headache syndrome: Secondary | ICD-10-CM | POA: Diagnosis not present

## 2014-11-08 DIAGNOSIS — R41841 Cognitive communication deficit: Secondary | ICD-10-CM | POA: Diagnosis not present

## 2014-11-08 DIAGNOSIS — F0781 Postconcussional syndrome: Secondary | ICD-10-CM | POA: Diagnosis not present

## 2014-11-09 ENCOUNTER — Telehealth: Payer: Self-pay | Admitting: Physician Assistant

## 2014-11-09 NOTE — Telephone Encounter (Signed)
Patient's spouse called this evening for patient. The patient was discharged from rehab recently for rhabdo. H/o CP with intermittent angina on Imdur/Ranexa. He's had 2 episodes of chest discomfort this evening and per spouse was "blas" about the episodes. First lasted 15 mins, relieved with 1 SL NTG. Second lasted 10 mins, relieved with 1 SL NTG. It was not particularly severe and there were no associated symptoms. No provocative factors. I told her it is difficult to fully evaluate/reassure over the phone and if they are concerned that the ER is an option for further evaluation. They declined, and instead elected option #2 which is to monitor for further symptoms and call the office in the AM to request an appointment for evaluation (possibly flex clinic) versus further discussion with Dr. [name] regarding symptoms. If symptoms return or persist they will proceed to the ER given that he's already had 2 SL NTG. Spouse verbalizes good understanding of plan. Dayna Dunn PA-C

## 2014-11-11 DIAGNOSIS — G4489 Other headache syndrome: Secondary | ICD-10-CM | POA: Diagnosis not present

## 2014-11-11 DIAGNOSIS — M6281 Muscle weakness (generalized): Secondary | ICD-10-CM | POA: Diagnosis not present

## 2014-11-11 DIAGNOSIS — F0781 Postconcussional syndrome: Secondary | ICD-10-CM | POA: Diagnosis not present

## 2014-11-11 DIAGNOSIS — R41841 Cognitive communication deficit: Secondary | ICD-10-CM | POA: Diagnosis not present

## 2014-11-11 DIAGNOSIS — R2689 Other abnormalities of gait and mobility: Secondary | ICD-10-CM | POA: Diagnosis not present

## 2014-11-11 DIAGNOSIS — G8929 Other chronic pain: Secondary | ICD-10-CM | POA: Diagnosis not present

## 2014-11-12 DIAGNOSIS — R748 Abnormal levels of other serum enzymes: Secondary | ICD-10-CM | POA: Diagnosis not present

## 2014-11-12 DIAGNOSIS — Z9181 History of falling: Secondary | ICD-10-CM | POA: Diagnosis not present

## 2014-11-12 DIAGNOSIS — I249 Acute ischemic heart disease, unspecified: Secondary | ICD-10-CM | POA: Diagnosis not present

## 2014-11-12 DIAGNOSIS — M25611 Stiffness of right shoulder, not elsewhere classified: Secondary | ICD-10-CM | POA: Diagnosis not present

## 2014-11-12 DIAGNOSIS — R778 Other specified abnormalities of plasma proteins: Secondary | ICD-10-CM | POA: Diagnosis not present

## 2014-11-12 DIAGNOSIS — I10 Essential (primary) hypertension: Secondary | ICD-10-CM | POA: Diagnosis not present

## 2014-11-12 DIAGNOSIS — F0390 Unspecified dementia without behavioral disturbance: Secondary | ICD-10-CM | POA: Diagnosis not present

## 2014-11-12 DIAGNOSIS — Z87891 Personal history of nicotine dependence: Secondary | ICD-10-CM | POA: Diagnosis not present

## 2014-11-12 DIAGNOSIS — R9431 Abnormal electrocardiogram [ECG] [EKG]: Secondary | ICD-10-CM | POA: Diagnosis not present

## 2014-11-12 DIAGNOSIS — I25119 Atherosclerotic heart disease of native coronary artery with unspecified angina pectoris: Secondary | ICD-10-CM | POA: Diagnosis not present

## 2014-11-12 DIAGNOSIS — Z0389 Encounter for observation for other suspected diseases and conditions ruled out: Secondary | ICD-10-CM | POA: Diagnosis not present

## 2014-11-12 DIAGNOSIS — J40 Bronchitis, not specified as acute or chronic: Secondary | ICD-10-CM | POA: Diagnosis not present

## 2014-11-12 DIAGNOSIS — R7989 Other specified abnormal findings of blood chemistry: Secondary | ICD-10-CM | POA: Diagnosis not present

## 2014-11-12 DIAGNOSIS — M25511 Pain in right shoulder: Secondary | ICD-10-CM | POA: Diagnosis not present

## 2014-11-13 ENCOUNTER — Inpatient Hospital Stay (HOSPITAL_COMMUNITY)
Admission: AD | Admit: 2014-11-13 | Discharge: 2014-11-15 | DRG: 247 | Disposition: A | Discharge status: Home Health | Payer: Medicare Other | Source: Other Acute Inpatient Hospital | Attending: Cardiovascular Disease | Admitting: Cardiovascular Disease

## 2014-11-13 ENCOUNTER — Encounter (HOSPITAL_COMMUNITY): Payer: Self-pay | Admitting: *Deleted

## 2014-11-13 DIAGNOSIS — Z955 Presence of coronary angioplasty implant and graft: Secondary | ICD-10-CM

## 2014-11-13 DIAGNOSIS — Z7982 Long term (current) use of aspirin: Secondary | ICD-10-CM

## 2014-11-13 DIAGNOSIS — F0390 Unspecified dementia without behavioral disturbance: Secondary | ICD-10-CM

## 2014-11-13 DIAGNOSIS — I472 Ventricular tachycardia: Secondary | ICD-10-CM | POA: Diagnosis not present

## 2014-11-13 DIAGNOSIS — I6523 Occlusion and stenosis of bilateral carotid arteries: Secondary | ICD-10-CM | POA: Diagnosis present

## 2014-11-13 DIAGNOSIS — I10 Essential (primary) hypertension: Secondary | ICD-10-CM

## 2014-11-13 DIAGNOSIS — M75121 Complete rotator cuff tear or rupture of right shoulder, not specified as traumatic: Secondary | ICD-10-CM | POA: Diagnosis present

## 2014-11-13 DIAGNOSIS — Z881 Allergy status to other antibiotic agents status: Secondary | ICD-10-CM

## 2014-11-13 DIAGNOSIS — G4733 Obstructive sleep apnea (adult) (pediatric): Secondary | ICD-10-CM

## 2014-11-13 DIAGNOSIS — M75101 Unspecified rotator cuff tear or rupture of right shoulder, not specified as traumatic: Secondary | ICD-10-CM

## 2014-11-13 DIAGNOSIS — J449 Chronic obstructive pulmonary disease, unspecified: Secondary | ICD-10-CM | POA: Diagnosis present

## 2014-11-13 DIAGNOSIS — I25119 Atherosclerotic heart disease of native coronary artery with unspecified angina pectoris: Secondary | ICD-10-CM | POA: Diagnosis not present

## 2014-11-13 DIAGNOSIS — I249 Acute ischemic heart disease, unspecified: Secondary | ICD-10-CM | POA: Diagnosis not present

## 2014-11-13 DIAGNOSIS — E785 Hyperlipidemia, unspecified: Secondary | ICD-10-CM

## 2014-11-13 DIAGNOSIS — Z885 Allergy status to narcotic agent status: Secondary | ICD-10-CM

## 2014-11-13 DIAGNOSIS — M25511 Pain in right shoulder: Secondary | ICD-10-CM | POA: Diagnosis not present

## 2014-11-13 DIAGNOSIS — I214 Non-ST elevation (NSTEMI) myocardial infarction: Principal | ICD-10-CM | POA: Diagnosis present

## 2014-11-13 DIAGNOSIS — I739 Peripheral vascular disease, unspecified: Secondary | ICD-10-CM | POA: Diagnosis present

## 2014-11-13 DIAGNOSIS — Z452 Encounter for adjustment and management of vascular access device: Secondary | ICD-10-CM | POA: Diagnosis not present

## 2014-11-13 DIAGNOSIS — R079 Chest pain, unspecified: Secondary | ICD-10-CM | POA: Diagnosis not present

## 2014-11-13 DIAGNOSIS — I480 Paroxysmal atrial fibrillation: Secondary | ICD-10-CM | POA: Diagnosis not present

## 2014-11-13 DIAGNOSIS — Z888 Allergy status to other drugs, medicaments and biological substances status: Secondary | ICD-10-CM

## 2014-11-13 DIAGNOSIS — I2510 Atherosclerotic heart disease of native coronary artery without angina pectoris: Secondary | ICD-10-CM | POA: Diagnosis present

## 2014-11-13 DIAGNOSIS — K219 Gastro-esophageal reflux disease without esophagitis: Secondary | ICD-10-CM | POA: Diagnosis present

## 2014-11-13 DIAGNOSIS — R7989 Other specified abnormal findings of blood chemistry: Secondary | ICD-10-CM | POA: Diagnosis not present

## 2014-11-13 DIAGNOSIS — I252 Old myocardial infarction: Secondary | ICD-10-CM

## 2014-11-13 DIAGNOSIS — M12811 Other specific arthropathies, not elsewhere classified, right shoulder: Secondary | ICD-10-CM

## 2014-11-13 DIAGNOSIS — Z882 Allergy status to sulfonamides status: Secondary | ICD-10-CM | POA: Diagnosis not present

## 2014-11-13 DIAGNOSIS — Z87891 Personal history of nicotine dependence: Secondary | ICD-10-CM

## 2014-11-13 DIAGNOSIS — R778 Other specified abnormalities of plasma proteins: Secondary | ICD-10-CM | POA: Diagnosis not present

## 2014-11-13 DIAGNOSIS — Z9181 History of falling: Secondary | ICD-10-CM | POA: Diagnosis not present

## 2014-11-13 LAB — CBC WITH DIFFERENTIAL/PLATELET
BASOS PCT: 0 % (ref 0–1)
Basophils Absolute: 0 10*3/uL (ref 0.0–0.1)
Eosinophils Absolute: 0.2 10*3/uL (ref 0.0–0.7)
Eosinophils Relative: 3 % (ref 0–5)
HCT: 38.6 % — ABNORMAL LOW (ref 39.0–52.0)
HEMOGLOBIN: 12.5 g/dL — AB (ref 13.0–17.0)
LYMPHS PCT: 23 % (ref 12–46)
Lymphs Abs: 1.7 10*3/uL (ref 0.7–4.0)
MCH: 32.1 pg (ref 26.0–34.0)
MCHC: 32.4 g/dL (ref 30.0–36.0)
MCV: 99 fL (ref 78.0–100.0)
MONOS PCT: 15 % — AB (ref 3–12)
Monocytes Absolute: 1.1 10*3/uL — ABNORMAL HIGH (ref 0.1–1.0)
NEUTROS ABS: 4.2 10*3/uL (ref 1.7–7.7)
NEUTROS PCT: 59 % (ref 43–77)
PLATELETS: 257 10*3/uL (ref 150–400)
RBC: 3.9 MIL/uL — AB (ref 4.22–5.81)
RDW: 15.5 % (ref 11.5–15.5)
WBC: 7.3 10*3/uL (ref 4.0–10.5)

## 2014-11-13 LAB — COMPREHENSIVE METABOLIC PANEL
ALBUMIN: 3.1 g/dL — AB (ref 3.5–5.2)
ALK PHOS: 76 U/L (ref 39–117)
ALT: 13 U/L (ref 0–53)
ANION GAP: 4 — AB (ref 5–15)
AST: 21 U/L (ref 0–37)
BILIRUBIN TOTAL: 0.9 mg/dL (ref 0.3–1.2)
BUN: 11 mg/dL (ref 6–23)
CO2: 31 mmol/L (ref 19–32)
Calcium: 8.7 mg/dL (ref 8.4–10.5)
Chloride: 105 mmol/L (ref 96–112)
Creatinine, Ser: 1.22 mg/dL (ref 0.50–1.35)
GFR calc non Af Amer: 53 mL/min — ABNORMAL LOW (ref >90)
GFR, EST AFRICAN AMERICAN: 61 mL/min — AB (ref >90)
Glucose, Bld: 105 mg/dL — ABNORMAL HIGH (ref 70–99)
Potassium: 4.4 mmol/L (ref 3.5–5.1)
Sodium: 140 mmol/L (ref 135–145)
Total Protein: 5.7 g/dL — ABNORMAL LOW (ref 6.0–8.3)

## 2014-11-13 LAB — BRAIN NATRIURETIC PEPTIDE: B Natriuretic Peptide: 342.6 pg/mL — ABNORMAL HIGH (ref 0.0–100.0)

## 2014-11-13 LAB — MAGNESIUM: Magnesium: 2.2 mg/dL (ref 1.5–2.5)

## 2014-11-13 LAB — TROPONIN I: Troponin I: 0.6 ng/mL (ref <0.031)

## 2014-11-13 LAB — PROTIME-INR
INR: 1.13 (ref 0.00–1.49)
Prothrombin Time: 14.6 seconds (ref 11.6–15.2)

## 2014-11-13 MED ORDER — ALBUTEROL SULFATE HFA 108 (90 BASE) MCG/ACT IN AERS: 1.0000 | INHALATION_SPRAY | RESPIRATORY_TRACT | Status: DC | PRN

## 2014-11-13 MED ORDER — RANOLAZINE ER 500 MG PO TB12
500.0000 mg | ORAL_TABLET | Freq: Two times a day (BID) | ORAL | Status: DC
  Administered 2014-11-13 – 2014-11-15 (×4): 500 mg via ORAL
  Filled 2014-11-13 (×5): qty 1

## 2014-11-13 MED ORDER — ASPIRIN EC 81 MG PO TBEC
81.0000 mg | DELAYED_RELEASE_TABLET | Freq: Every day | ORAL | Status: DC
Start: 2014-11-14 — End: 2014-11-13

## 2014-11-13 MED ORDER — ASPIRIN 300 MG RE SUPP: 300.0000 mg | RECTAL | Status: AC

## 2014-11-13 MED ORDER — ASPIRIN 81 MG PO CHEW
324.0000 mg | CHEWABLE_TABLET | ORAL | Status: AC
  Administered 2014-11-13: 324 mg via ORAL
  Filled 2014-11-13: qty 4

## 2014-11-13 MED ORDER — ASPIRIN 81 MG PO CHEW
81.0000 mg | CHEWABLE_TABLET | ORAL | Status: AC
  Administered 2014-11-14: 81 mg via ORAL
  Filled 2014-11-13: qty 1

## 2014-11-13 MED ORDER — QUETIAPINE FUMARATE 50 MG PO TABS
50.0000 mg | ORAL_TABLET | Freq: Every day | ORAL | Status: DC
  Administered 2014-11-13 – 2014-11-14 (×2): 50 mg via ORAL
  Filled 2014-11-13 (×3): qty 1

## 2014-11-13 MED ORDER — ALBUTEROL SULFATE (2.5 MG/3ML) 0.083% IN NEBU: 2.5000 mg | INHALATION_SOLUTION | RESPIRATORY_TRACT | Status: DC | PRN

## 2014-11-13 MED ORDER — DONEPEZIL HCL 5 MG PO TABS: 5.0000 mg | ORAL_TABLET | Freq: Every day | ORAL | Status: DC

## 2014-11-13 MED ORDER — ENOXAPARIN SODIUM 40 MG/0.4ML ~~LOC~~ SOLN
40.0000 mg | SUBCUTANEOUS | Status: DC
  Administered 2014-11-13: 40 mg via SUBCUTANEOUS
  Filled 2014-11-13: qty 0.4

## 2014-11-13 MED ORDER — ISOSORBIDE MONONITRATE ER 30 MG PO TB24: 30.0000 mg | ORAL_TABLET | Freq: Two times a day (BID) | ORAL | Status: DC

## 2014-11-13 MED ORDER — DULOXETINE HCL 60 MG PO CPEP
60.0000 mg | ORAL_CAPSULE | Freq: Every day | ORAL | Status: DC
  Administered 2014-11-14 – 2014-11-15 (×4): 60 mg via ORAL
  Filled 2014-11-13 (×3): qty 1

## 2014-11-13 MED ORDER — SODIUM CHLORIDE 0.9 % IV SOLN: 250.0000 mL | INTRAVENOUS | Status: DC | PRN

## 2014-11-13 MED ORDER — SODIUM CHLORIDE 0.9 % IV SOLN
1.0000 mL/kg/h | INTRAVENOUS | Status: DC
  Administered 2014-11-14 (×2): 1 mL/kg/h via INTRAVENOUS

## 2014-11-13 MED ORDER — FUROSEMIDE 40 MG PO TABS
40.0000 mg | ORAL_TABLET | Freq: Every day | ORAL | Status: DC
Start: 2014-11-14 — End: 2014-11-15
  Administered 2014-11-14 – 2014-11-15 (×3): 40 mg via ORAL
  Filled 2014-11-13 (×2): qty 1

## 2014-11-13 MED ORDER — MEMANTINE HCL ER 14 MG PO CP24
14.0000 mg | ORAL_CAPSULE | Freq: Every day | ORAL | Status: DC
  Administered 2014-11-13 – 2014-11-14 (×2): 14 mg via ORAL
  Filled 2014-11-13 (×4): qty 1

## 2014-11-13 MED ORDER — TRAMADOL HCL 50 MG PO TABS
50.0000 mg | ORAL_TABLET | Freq: Three times a day (TID) | ORAL | Status: DC
  Administered 2014-11-13 – 2014-11-15 (×4): 50 mg via ORAL
  Filled 2014-11-13 (×4): qty 1

## 2014-11-13 MED ORDER — ASPIRIN EC 81 MG PO TBEC
81.0000 mg | DELAYED_RELEASE_TABLET | Freq: Every day | ORAL | Status: DC
  Administered 2014-11-15: 10:00:00 81 mg via ORAL
  Filled 2014-11-13: qty 1

## 2014-11-13 MED ORDER — MIRTAZAPINE 30 MG PO TABS
30.0000 mg | ORAL_TABLET | Freq: Every day | ORAL | Status: DC
  Administered 2014-11-13 – 2014-11-14 (×2): 30 mg via ORAL
  Filled 2014-11-13: qty 4
  Filled 2014-11-13 (×2): qty 1

## 2014-11-13 MED ORDER — PANTOPRAZOLE SODIUM 40 MG PO TBEC
40.0000 mg | DELAYED_RELEASE_TABLET | Freq: Every day | ORAL | Status: DC
  Administered 2014-11-14 – 2014-11-15 (×2): 40 mg via ORAL
  Filled 2014-11-13 (×2): qty 1

## 2014-11-13 MED ORDER — METOPROLOL TARTRATE 25 MG PO TABS
50.0000 mg | ORAL_TABLET | Freq: Two times a day (BID) | ORAL | Status: DC
  Administered 2014-11-13 – 2014-11-15 (×4): 50 mg via ORAL
  Filled 2014-11-13 (×2): qty 1
  Filled 2014-11-13 (×2): qty 2

## 2014-11-13 MED ORDER — ISOSORBIDE MONONITRATE ER 60 MG PO TB24
60.0000 mg | ORAL_TABLET | Freq: Every day | ORAL | Status: DC
  Administered 2014-11-14 – 2014-11-15 (×2): 60 mg via ORAL
  Filled 2014-11-13 (×2): qty 1

## 2014-11-13 MED ORDER — NITROGLYCERIN 0.3 MG SL SUBL: 0.3000 mg | SUBLINGUAL_TABLET | SUBLINGUAL | Status: DC | PRN

## 2014-11-13 MED ORDER — ACETAMINOPHEN 325 MG PO TABS: 650.0000 mg | ORAL_TABLET | ORAL | Status: DC | PRN

## 2014-11-13 MED ORDER — FLUTICASONE PROPIONATE 50 MCG/ACT NA SUSP
2.0000 | Freq: Every day | NASAL | Status: DC
  Administered 2014-11-14 – 2014-11-15 (×2): 2 via NASAL
  Filled 2014-11-13 (×2): qty 16

## 2014-11-13 MED ORDER — SODIUM CHLORIDE 0.9 % IJ SOLN
3.0000 mL | Freq: Two times a day (BID) | INTRAMUSCULAR | Status: DC
  Administered 2014-11-13: 3 mL via INTRAVENOUS

## 2014-11-13 MED ORDER — MEMANTINE HCL ER 28 MG PO CP24: 28.0000 mg | ORAL_CAPSULE | Freq: Every day | ORAL | Status: DC

## 2014-11-13 MED ORDER — SERTRALINE HCL 50 MG PO TABS: 50.0000 mg | ORAL_TABLET | Freq: Every day | ORAL | Status: DC

## 2014-11-13 MED ORDER — NITROGLYCERIN 0.4 MG SL SUBL: 0.4000 mg | SUBLINGUAL_TABLET | SUBLINGUAL | Status: DC | PRN

## 2014-11-13 MED ORDER — ISOSORBIDE MONONITRATE ER 30 MG PO TB24
30.0000 mg | ORAL_TABLET | Freq: Every day | ORAL | Status: DC
  Administered 2014-11-13 – 2014-11-14 (×2): 30 mg via ORAL
  Filled 2014-11-13 (×3): qty 1

## 2014-11-13 MED ORDER — SODIUM CHLORIDE 0.9 % IJ SOLN: 3.0000 mL | INTRAMUSCULAR | Status: DC | PRN

## 2014-11-13 MED ORDER — ADULT MULTIVITAMIN W/MINERALS CH
1.0000 | ORAL_TABLET | Freq: Every day | ORAL | Status: DC
  Administered 2014-11-14 – 2014-11-15 (×2): 1 via ORAL
  Filled 2014-11-13 (×2): qty 1

## 2014-11-13 MED ORDER — CLONIDINE HCL 0.1 MG PO TABS: 0.0500 mg | ORAL_TABLET | Freq: Two times a day (BID) | ORAL | Status: DC

## 2014-11-13 MED ORDER — ATORVASTATIN CALCIUM 40 MG PO TABS
40.0000 mg | ORAL_TABLET | Freq: Every day | ORAL | Status: DC
  Administered 2014-11-13: 40 mg via ORAL
  Filled 2014-11-13 (×2): qty 1

## 2014-11-13 MED ORDER — FESOTERODINE FUMARATE ER 8 MG PO TB24: 8.0000 mg | ORAL_TABLET | Freq: Every day | ORAL | Status: DC

## 2014-11-13 MED ORDER — DONEPEZIL HCL 10 MG PO TABS
10.0000 mg | ORAL_TABLET | Freq: Every day | ORAL | Status: DC
  Administered 2014-11-13 – 2014-11-14 (×2): 10 mg via ORAL
  Filled 2014-11-13 (×3): qty 1

## 2014-11-13 MED ORDER — ONDANSETRON HCL 4 MG/2ML IJ SOLN: 4.0000 mg | Freq: Four times a day (QID) | INTRAMUSCULAR | Status: DC | PRN

## 2014-11-13 NOTE — H&P (Signed)
CARDIOLOGY H&P   PLEASE NOTE PATIENT HAS ANOTHER MEDICAL RECORD # AND ALL HIS NOTES ARE UNDER   Gavin [name] MRN:  [id number]  Primary Cardiologist: [name]  HPI:  Mr. Gavin [name] is an 81year-old male with CAD s/p multiple PCIs, (Echo 4/13 EF > 55%), PAD, HTN, HL, mild dementia, OSA on CPAP, PAF (off coumadin due to multiple falls) transferred from [facility] with shoulder pain and + troponin.   He was admitted 05/29/13 with recurrent chest pain worrisome for unstable angina. His Troponin was negative. He underwent coronary angiogram 06/01/13. This revealed moderate RCA, ostial CFX, and distal Dx1 disease with know distal LAD occlusion. There was no obvious culprit lesion and he was treated medically. Norvasc and Ranexa were added and his Imdur was decreased.  He presented to [facility] yesterday with severe R shoulder pain which was worse with moving his arm. He says he had surgery in that should more than 10 years ago.  He denied CP or SOB. However a troponin was checked and it was 1.12.  ECG showed inferior and anterolateral TWI which were new. Troponin dropped to 0.78 then back up to 0.95. TWI improved. pBNP 4630.  He was seen by Dr. Edyth Gunnels in cardiology and referred for cath.   Says that he walks with walker at home but not very mobile. Denies CP.   Shoulder x-ray shows chronic full-thickness rotator cuff tear with narrowing of subacromial space.   Review of Systems:     Cardiac Review of Systems: {Y] = yes [ ]  = no  Chest Pain [    ]  Resting SOB [   ] Exertional SOB  [  ]  Orthopnea [  ]   Pedal Edema [   ]    Palpitations [  ] Syncope  [  ]   Presyncope [   ]  General Review of Systems: [Y] = yes [  ]=no Constitional: recent weight change [  ]; anorexia [  ]; fatigue Blue.Reese  ]; nausea [  ]; night sweats [  ]; fever [  ]; or chills [  ];                 Dental: poor dentition[  ];   Eye : blurred vision [  ]; diplopia [   ]; vision changes [  ];  Amaurosis fugax[  ]; Resp:  cough [  ];  wheezing[  ];  hemoptysis[  ]; shortness of breath[  ]; paroxysmal nocturnal dyspnea[  ]; dyspnea on exertion[  ]; or orthopnea[  ];  GI:  gallstones[  ], vomiting[  ];  dysphagia[  ]; melena[  ];  hematochezia [  ]; heartburn[  ];   GU: kidney stones [  ]; hematuria[  ];   dysuria [  ];  nocturia[  ];               Skin: rash [  ], swelling[  ];, hair loss[  ];  peripheral edema[  ];  or itching[  ]; Musculosketetal: myalgias[  ];  joint swelling[y  ];  joint erythema[  ];  joint pain[ y ];  back pain[  ];  Heme/Lymph: bruising[  ];  bleeding[  ];  anemia[  ];  Neuro: TIA[  ];  headaches[  ];  stroke[  ];  vertigo[  ];  seizures[  ];   paresthesias[  ];  difficulty walking[y  ];  Psych:depression[  ]; anxiety[  ];  Endocrine:  diabetes[  ];  thyroid dysfunction[  ];  Other:  Past Medical History  Diagnosis Date  . OSA (obstructive sleep apnea)     NPSG 05-05-99 RDI 65/hr-CPAP  . Chronic airway obstruction, not elsewhere classified   . Lung nodules     CT 07-01-10 stable @ [place]  . CAD (coronary artery disease)     Hx of MI 2007,   . Reflux esophagitis   . GERD (gastroesophageal reflux disease)   . Hypertension   . Hyperlipidemia   . PVD (peripheral vascular disease) 2004    stenting of both SFA's 2004  . Carotid disease, bilateral     left greater than right  . STEMI (ST elevation myocardial infarction) 2007    stent to LAD    Past Surgical History  Procedure Laterality Date  . Right rotator cuff    . Appendectomy    . Tonsillectomy    . Coronary angioplasty with stent placement  06/13/2004    successful cutting balloon atherectomy/PIi & STENTING mid LAD & POBA of the distal LAD  . Coronary angioplasty with stent placement  06/03/2006    successful PTCA & stent mid LAD  . Coronary angioplasty with stent placement  06/06/2006    successful double stenting of the ostial and  prox RCA, successful PTCA LAD of the ostium major diag branch due to jailing by a previously placed LAD stent,successful mid kiagonal branch primary stenting  . Coronary angioplasty with stent placement  09/20/2008    PTCA LAD stent  . Cardiac catheterization  10/03/2008    Occluded LAD  . US echocardiography  12/24/2011    trace MR,TR,AI,PI. EF >55%  . Nm myoview ltd  08/01/2011    Abnormal-intermed to high risk based upon per-infarct ischemia  . Carotid doppler  05/29/2012    bilateral ICA stenosis        Prior to Admission medications   Medication Sig Start Date End Date Taking? Authorizing Provider  aspirin EC 81 MG tablet Take 81 mg by mouth daily.    Historical Provider, MD  atorvastatin (LIPITOR) 40 MG tablet  09/19/14   Historical Provider, MD  cefdinir (OMNICEF) 300 MG capsule  08/17/14   Historical Provider, MD  cephALEXin (KEFLEX) 500 MG capsule  09/14/14   Historical Provider, MD  cloNIDine (CATAPRES) 0.1 MG tablet Take 0.05 mg by mouth 2 (two) times daily.    Historical Provider, MD  diclofenac sodium (VOLTAREN) 1 % GEL Apply topically 4 (four) times daily.    Historical Provider, MD  donepezil (ARICEPT) 5 MG tablet  08/14/14   Historical Provider, MD  DULoxetine (CYMBALTA) 60 MG capsule Take 60 mg by mouth daily. 11/09/14   Historical Provider, MD  furosemide (LASIX) 40 MG tablet  09/19/14   Historical Provider, MD  ibuprofen (ADVIL,MOTRIN) 600 MG tablet Take 600 mg by mouth every 6 (six) hours. 09/25/14   Historical Provider, MD  isosorbide mononitrate (IMDUR) 30 MG 24 hr tablet Take 30-60 mg by mouth 2 (two) times daily. Take 2 tablets (60 mg) every morning and 1 tablet (30 mg) at night 11/09/14   Historical Provider, MD  metoprolol (LOPRESSOR) 50 MG tablet  11/09/14   Historical Provider, MD  mirtazapine (REMERON) 30 MG tablet Take 30 mg by mouth at bedtime. 10/28/14   Historical Provider, MD  Misc Natural Products (LUTEIN VISION BLEND PO) Take by  mouth.    Historical Provider, MD  mometasone (NASONEX) 50 MCG/ACT nasal spray Place 2 sprays into both nostrils  2 (two) times daily.    Historical Provider, MD  Multiple Vitamin (MULTIVITAMIN WITH MINERALS) TABS tablet Take 1 tablet by mouth daily.    Historical Provider, MD  mupirocin ointment (BACTROBAN) 2 %  09/14/14   Historical Provider, MD  NAMENDA XR 28 MG CP24 24 hr capsule  08/14/14   Historical Provider, MD  [facility] 14-10 MG CP24  11/03/14   Historical Provider, MD  NITROSTAT 0.4 MG SL tablet  09/26/14   Historical Provider, MD  pantoprazole (PROTONIX) 40 MG tablet Take 40 mg by mouth daily. 11/02/14   Historical Provider, MD  PROAIR HFA 108 (90 BASE) MCG/ACT inhaler  08/17/14   Historical Provider, MD  QUEtiapine (SEROQUEL) 50 MG tablet  11/02/14   Historical Provider, MD  RANEXA 500 MG 12 hr tablet Take 500 mg by mouth 2 (two) times daily. 11/09/14   Historical Provider, MD  sertraline (ZOLOFT) 50 MG tablet Take 50 mg by mouth daily.    Historical Provider, MD  TOVIAZ 8 MG TB24 tablet  08/28/14   Historical Provider, MD  traMADol (ULTRAM) 50 MG tablet Take 50 mg by mouth 3 (three) times daily. 09/25/14   Historical Provider, MD  vitamin B-12 (CYANOCOBALAMIN) 1000 MCG tablet Take 1,000 mcg by mouth daily.    Historical Provider, MD  zaleplon (SONATA) 10 MG capsule  10/18/14   Historical Provider, MD     Allergies  Allergen Reactions  . Ciprofloxacin Other (See Comments)    Unknown allergic reaction  . Codeine Other (See Comments)    "knocked me out"  . Dexamethasone Other (See Comments)    Unknown allergic reaction  . Sulfa Antibiotics Nausea And Vomiting  . Telmisartan Other (See Comments)    Micardis - unknown allergic reaction    Social History  . Marital Status: Married    Spouse Name: N/A    Number of Children: N/A  . Years of Education: N/A   Occupational History  . 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  Smoking status: Former Smoker    Types: Cigarettes    Quit date: 09/23/1973  . Smokeless tobacco: Never Used     Comment: tried snuff and chew as a child (69yr) did not continue  . Alcohol Use: No     Comment: seldom-- every three or four years  . Drug Use: No  . Sexual Activity: Not on file           Family History  Problem Relation Age of Onset  . Heart attack Father   . Heart failure Mother   . Stroke Maternal Grandmother         PHYSICAL EXAM: Filed Vitals:   11/13/14 1608  BP: 142/53  Pulse: 64  Temp: 98.4 F (36.9 C)  Resp: 16   General:  Elderly. HOH. Lying flat in bed . No respiratory difficulty HEENT: normal Neck: supple.  JVP 8-9. Carotids 2+ bilat; no bruits. No lymphadenopathy or thryomegaly appreciated. Cor: PMI nondisplaced. Regular rate & rhythm. 2/6 SEM LSB Lungs: clear Abdomen: soft, nontender, nondistended. No hepatosplenomegaly. No bruits or masses. Good bowel sounds. Extremities: no cyanosis, clubbing, rash, tr edema. RUE in sling  Neuro: alert & oriented x 3, cranial nerves grossly intact.  Affect pleasant.  ECG: NSR with anterolateral and inferior TWI (new)  No results found for this or any previous visit (from the past 24 hour(s)). No results found.   ASSESSMENT: 1. Shoulder pain 2. NSTEMI with ECG changes 3. CAD  s/p multiple PCIs 4. HTN 5. HL  6. PAF - now in NSR. Not on warfarin due to falls.   PLAN/DISCUSSION:  Difficult situation. Shoulder pain seems to be clearly musculoskeletal but ECG and troponin are dynamic and suggestive of NSTEMI. Will likely need cath. However, I will review with Dr. [name] who knows him well. He does have mild volume overload on exam and will give one dose of IV lasix. Consult PT/OT.    Benay Spice 4:39 PM

## 2014-11-14 ENCOUNTER — Encounter (HOSPITAL_COMMUNITY): Payer: Self-pay | Admitting: *Deleted

## 2014-11-14 ENCOUNTER — Encounter (HOSPITAL_COMMUNITY)
Admission: AD | Disposition: A | Discharge status: Home Health | Payer: Self-pay | Source: Other Acute Inpatient Hospital | Attending: Cardiovascular Disease

## 2014-11-14 DIAGNOSIS — I2510 Atherosclerotic heart disease of native coronary artery without angina pectoris: Secondary | ICD-10-CM

## 2014-11-14 HISTORY — PX: LEFT HEART CATHETERIZATION WITH CORONARY ANGIOGRAM: SHX5451

## 2014-11-14 LAB — POCT ACTIVATED CLOTTING TIME: ACTIVATED CLOTTING TIME: 521 s

## 2014-11-14 LAB — LIPID PANEL
Cholesterol: 154 mg/dL (ref 0–200)
HDL: 43 mg/dL (ref >39)
LDL CALC: 95 mg/dL (ref 0–99)
Total CHOL/HDL Ratio: 3.6 RATIO
Triglycerides: 80 mg/dL (ref <150)
VLDL: 16 mg/dL (ref 0–40)

## 2014-11-14 LAB — TROPONIN I
TROPONIN I: 0.63 ng/mL — AB (ref <0.031)
Troponin I: 0.46 ng/mL — ABNORMAL HIGH (ref <0.031)

## 2014-11-14 SURGERY — LEFT HEART CATHETERIZATION WITH CORONARY ANGIOGRAM

## 2014-11-14 MED ORDER — MIDAZOLAM HCL 2 MG/2ML IJ SOLN
INTRAMUSCULAR | Status: AC
  Filled 2014-11-14: qty 2

## 2014-11-14 MED ORDER — CANGRELOR TETRASODIUM 50 MG IV SOLR
INTRAVENOUS | Status: AC
  Filled 2014-11-14: qty 50

## 2014-11-14 MED ORDER — LIDOCAINE HCL (PF) 1 % IJ SOLN
INTRAMUSCULAR | Status: AC
  Filled 2014-11-14: qty 30

## 2014-11-14 MED ORDER — HYDRALAZINE HCL 20 MG/ML IJ SOLN
10.0000 mg | Freq: Four times a day (QID) | INTRAMUSCULAR | Status: DC | PRN
  Administered 2014-11-14: 10 mg via INTRAVENOUS
  Filled 2014-11-14: qty 1

## 2014-11-14 MED ORDER — SODIUM CHLORIDE 0.9 % IJ SOLN
3.0000 mL | Freq: Two times a day (BID) | INTRAMUSCULAR | Status: DC
  Administered 2014-11-15: 01:00:00 3 mL via INTRAVENOUS

## 2014-11-14 MED ORDER — HEPARIN (PORCINE) IN NACL 2-0.9 UNIT/ML-% IJ SOLN
INTRAMUSCULAR | Status: AC
Start: 2014-11-14 — End: 2014-11-14
  Filled 2014-11-14: qty 1500

## 2014-11-14 MED ORDER — HEPARIN SODIUM (PORCINE) 1000 UNIT/ML IJ SOLN
INTRAMUSCULAR | Status: AC
  Filled 2014-11-14: qty 1

## 2014-11-14 MED ORDER — VERAPAMIL HCL 2.5 MG/ML IV SOLN
INTRAVENOUS | Status: AC
  Filled 2014-11-14: qty 2

## 2014-11-14 MED ORDER — HEPARIN (PORCINE) IN NACL 100-0.45 UNIT/ML-% IJ SOLN
1050.0000 [IU]/h | INTRAMUSCULAR | Status: DC
  Administered 2014-11-14: 1050 [IU]/h via INTRAVENOUS
  Filled 2014-11-14: qty 250

## 2014-11-14 MED ORDER — MEMANTINE HCL ER 14 MG PO CP24
14.0000 mg | ORAL_CAPSULE | Freq: Every day | ORAL | Status: DC
  Filled 2014-11-14: qty 1

## 2014-11-14 MED ORDER — TICAGRELOR 90 MG PO TABS
90.0000 mg | ORAL_TABLET | Freq: Two times a day (BID) | ORAL | Status: DC
  Administered 2014-11-15: 90 mg via ORAL
  Filled 2014-11-14 (×2): qty 1

## 2014-11-14 MED ORDER — TICAGRELOR 90 MG PO TABS
ORAL_TABLET | ORAL | Status: AC
  Filled 2014-11-14: qty 2

## 2014-11-14 MED ORDER — DONEPEZIL HCL 10 MG PO TABS
10.0000 mg | ORAL_TABLET | Freq: Every day | ORAL | Status: DC
  Filled 2014-11-14: qty 1

## 2014-11-14 MED ORDER — SODIUM CHLORIDE 0.9 % IV SOLN: 1.0000 mL/kg/h | INTRAVENOUS | Status: AC

## 2014-11-14 MED ORDER — SODIUM CHLORIDE 0.9 % IV SOLN: 250.0000 mL | INTRAVENOUS | Status: DC | PRN

## 2014-11-14 MED ORDER — BIVALIRUDIN 250 MG IV SOLR
INTRAVENOUS | Status: AC
  Filled 2014-11-14: qty 250

## 2014-11-14 MED ORDER — ZOLPIDEM TARTRATE 5 MG PO TABS
5.0000 mg | ORAL_TABLET | Freq: Every evening | ORAL | Status: DC | PRN
  Administered 2014-11-14: 5 mg via ORAL
  Filled 2014-11-14: qty 1

## 2014-11-14 MED ORDER — SODIUM CHLORIDE 0.9 % IV SOLN
4.0000 ug/kg/min | INTRAVENOUS | Status: AC
  Filled 2014-11-14: qty 50

## 2014-11-14 MED ORDER — SODIUM CHLORIDE 0.9 % IJ SOLN: 3.0000 mL | INTRAMUSCULAR | Status: DC | PRN

## 2014-11-14 MED ORDER — HEPARIN BOLUS VIA INFUSION
4000.0000 [IU] | Freq: Once | INTRAVENOUS | Status: AC
  Administered 2014-11-14: 4000 [IU] via INTRAVENOUS
  Filled 2014-11-14: qty 4000

## 2014-11-14 MED ORDER — MEMANTINE HCL-DONEPEZIL HCL ER 14-10 MG PO CP24: 1.0000 | ORAL_CAPSULE | Freq: Every day | ORAL | Status: DC

## 2014-11-14 NOTE — Evaluation (Signed)
Physical Therapy Evaluation Patient Details Name: Gavin [name] MRN: [id number] DOB: November 15, 1928 Today's Date: 11/14/2014   History of Present Illness  Pt admit with NSTEMI.  For CATH Mon 2/22.  Has right rotator cuff tear as well.    Clinical Impression  Pt admitted with above diagnosis. Pt currently with functional limitations due to the deficits listed below (see PT Problem List). Pt should progress well and go home with wife with 24 hour care and [place], [place], and [place].  If pt does not have 24 hour care, may benefit from short term NHP.  Will follow acutely.   Pt will benefit from skilled PT to increase their independence and safety with mobility to allow discharge to the venue listed below.      Follow Up Recommendations Home health PT;Supervision/Assistance - 24 hour (NHP if pt does not have 24 hour care)    Equipment Recommendations  None recommended by PT    Recommendations for Other Services       Precautions / Restrictions Precautions Precautions: Fall Restrictions Weight Bearing Restrictions: No      Mobility  Bed Mobility Overal bed mobility: Needs Assistance Bed Mobility: Supine to Sit     Supine to sit: Mod assist;+2 for physical assistance     General bed mobility comments: Took pt incr time to get to EOB.  Pt needed mod assist and cues.  Had difficulty moving LEs and elevating trunk to get to EOB.    Transfers Overall transfer level: Needs assistance Equipment used: Rolling walker (2 wheeled) Transfers: Sit to/from Stand Sit to Stand: Min assist         General transfer comment: Needed cues for hand placement.  Took pt incr time to power up but was able to stand with min assist.    Ambulation/Gait Ambulation/Gait assistance: Min assist Ambulation Distance (Feet): 3 Feet Assistive device: Rolling walker (2 wheeled) Gait Pattern/deviations: Narrow base of support   Gait velocity interpretation: Below normal speed for age/gender General Gait Details:  Took several side steps up to get to [facility].  Slightly unsteady on feet but able to take steps with RW.    Stairs            Wheelchair Mobility    Modified Rankin (Stroke Patients Only)       Balance Overall balance assessment: Needs assistance;History of Falls Sitting-balance support: No upper extremity supported;Feet supported Sitting balance-Leahy Scale: Fair   Postural control: Posterior lean Standing balance support: Bilateral upper extremity supported;During functional activity Standing balance-Leahy Scale: Poor Standing balance comment: Pt able to stand with RW with UE support with steadying assist needed.                               Pertinent Vitals/Pain Pain Assessment: No/denies pain  VSS    Home Living Family/patient expects to be discharged to:: Private residence Living Arrangements: Spouse/significant other Available Help at Discharge: Family;Available 24 hours/day Type of Home: House Home Access: Stairs to enter   [employer] of Steps: 3 Home Layout: One level Home Equipment: Walker - 2 wheels;Cane - single point;Shower seat;Other (comment) (left rail on hospital bed) Additional Comments: Pt states they can hire help if needed and that daughter can stay some.       Prior Function Level of Independence: Independent with assistive device(s)         Comments: Pt states he used the RW recently at all times.  Pt  lethargic and difficult to find out information at present.       Hand Dominance        Extremity/Trunk Assessment   Upper Extremity Assessment: Defer to OT evaluation           Lower Extremity Assessment: Generalized weakness         Communication   Communication: No difficulties  Cognition Arousal/Alertness: Lethargic Behavior During Therapy: Flat affect Overall Cognitive Status: No family/caregiver present to determine baseline cognitive functioning                      General Comments       Exercises General Exercises - Lower Extremity Ankle Circles/Pumps: AROM;10 reps;Both;Seated Long Arc Quad: AROM;Both;10 reps;Seated      Assessment/Plan    PT Assessment Patient needs continued PT services  PT Diagnosis Generalized weakness   PT Problem List Decreased activity tolerance;Decreased balance;Decreased mobility;Decreased knowledge of use of DME;Decreased safety awareness;Decreased knowledge of precautions  PT Treatment Interventions DME instruction;Gait training;Therapeutic activities;Functional mobility training;Therapeutic exercise;Balance training;Patient/family education   PT Goals (Current goals can be found in the Care Plan section) Acute Rehab PT Goals Patient Stated Goal: unable to assess PT Goal Formulation: With patient Time For Goal Achievement: 11/21/14 Potential to Achieve Goals: Good    Frequency Min 3X/week   Barriers to discharge        Co-evaluation               End of Session Equipment Utilized During Treatment: Gait belt Activity Tolerance: Patient limited by fatigue Patient left: in chair;with call bell/phone within reach;with bed alarm set Nurse Communication: Mobility status         Time: 2725-3664 PT Time Calculation (min) (ACUTE ONLY): 10 min   Charges:   PT Evaluation $Initial PT Evaluation Tier I: 1 Procedure     PT G CodesIrwin Brakeman F 12-13-2014, 1:30 PM [place] Benjy Kana,PT Acute Rehabilitation [phone] (pager)

## 2014-11-14 NOTE — Evaluation (Signed)
Occupational Therapy Evaluation Patient Details Name: Gavin [name] MRN: [id number] DOB: [birth year] Today's Date: 11/14/2014    History of Present Illness Pt admit with NSTEMI.  For CATH Mon 2/22.  Has right rotator cuff tear as well.     Clinical Impression   Unclear pt's PLOF with no family available and pt with lethargy and impaired cognition.  Pt presents with generalized weakness and impaired balance in addition to cognitive deficits interfering with ability to perform ADL and ADL transfers.  Will follow acutely with goal of pt returning home with support of his family and home health pending progress.     Follow Up Recommendations  Home health OT;Supervision/Assistance - 24 hour (depending on progress and availability of adequate assist.)    Equipment Recommendations       Recommendations for Other Services       Precautions / Restrictions Precautions Precautions: Fall Restrictions Weight Bearing Restrictions: No      Mobility Bed Mobility Overal bed mobility: Needs Assistance Bed Mobility: Supine to Sit;Sit to Supine     Supine to sit: Mod assist;HOB elevated Sit to supine: Min assist;HOB elevated   General bed mobility comments: assist for LEs and trunk, moves slowly  Transfers Overall transfer level: Needs assistance Equipment used: 1 person hand held assist Transfers: Sit to/from Stand Sit to Stand: Min assist         General transfer comment: pushed up with rail and one hand held, required increased time, but min assist.    Balance Overall balance assessment: Needs assistance;History of Falls Sitting-balance support: No upper extremity supported;Feet supported Sitting balance-Leahy Scale: Fair   Postural control: Posterior lean Standing balance support: Bilateral upper extremity supported;During functional activity Standing balance-Leahy Scale: Poor                             ADL Overall ADL's : Needs  assistance/impaired Eating/Feeding: NPO (awaiting cardiac cath)   Grooming: Wash/dry hands;Wash/dry face;Moderate assistance;Bed level   Upper Body Bathing: Moderate assistance;Sitting   Lower Body Bathing: Sit to/from stand;Maximal assistance   Upper Body Dressing : Moderate assistance;Sitting   Lower Body Dressing: Sit to/from stand;Maximal assistance                 General ADL Comments: Limited assessment due to lethargy.     Vision     Perception     Praxis      Pertinent Vitals/Pain Pain Assessment: No/denies pain     Hand Dominance Right   Extremity/Trunk Assessment Upper Extremity Assessment Upper Extremity Assessment: Generalized weakness;Difficult to assess due to impaired cognition (arthritic changes B hands)   Lower Extremity Assessment Lower Extremity Assessment: Generalized weakness       Communication Communication Communication: No difficulties   Cognition Arousal/Alertness: Lethargic Behavior During Therapy: Flat affect Overall Cognitive Status: No family/caregiver present to determine baseline cognitive functioning Area of Impairment: Orientation;Attention;Memory;Safety/judgement Orientation Level: Disoriented to;Place;Time;Situation Current Attention Level: Focused Memory: Decreased short-term memory   Safety/Judgement: Decreased awareness of safety;Decreased awareness of deficits     General Comments: Pt stated he thought he had a voice lesson this afternoon after he goes to lunch.   General Comments       Exercises Exercises: General Lower Extremity     Shoulder Instructions      Home Living Family/patient expects to be discharged to:: Private residence Living Arrangements: Spouse/significant other Available Help at Discharge: Family;Available 24 hours/day Type of Home: House Home Access: Stairs  to enter Entrance Stairs-Number of Steps: 3   Home Layout: One level     Bathroom Shower/Tub: Engineer, site: Standard     Home Equipment: Environmental consultant - 2 wheels;Cane - single point;Shower seat;Other (comment)   Additional Comments: Pt states they can hire help if needed and that daughter can stay some.         Prior Functioning/Environment Level of Independence: Needs assistance  Gait / Transfers Assistance Needed: ambulated with rollator ADL's / Homemaking Assistance Needed: assisted for bathing and dressing, pt not specific as to how much help he required   Comments: pt with lethargy and confusion    OT Diagnosis: Generalized weakness;Cognitive deficits   OT Problem List: Decreased strength;Decreased activity tolerance;Impaired balance (sitting and/or standing);Decreased coordination;Decreased cognition;Decreased safety awareness;Decreased knowledge of use of DME or AE;Impaired UE functional use   OT Treatment/Interventions: Self-care/ADL training;DME and/or AE instruction;Therapeutic activities;Patient/family education;Balance training;Cognitive remediation/compensation    OT Goals(Current goals can be found in the care plan section) Acute Rehab OT Goals Patient Stated Goal: unable to assess OT Goal Formulation: Patient unable to participate in goal setting Time For Goal Achievement: 11/28/14 Potential to Achieve Goals: Fair ADL Goals Pt Will Perform Grooming: with supervision;standing Pt Will Perform Upper Body Bathing: with min assist;sitting Pt Will Perform Lower Body Bathing: with min assist;sit to/from stand Pt Will Perform Upper Body Dressing: with min assist;sitting Pt Will Perform Lower Body Dressing: with min assist;sit to/from stand Pt Will Transfer to Toilet: with supervision;ambulating;regular height toilet Pt Will Perform Toileting - Clothing Manipulation and hygiene: with min assist;sit to/from stand  OT Frequency: Min 2X/week   Barriers to D/C:            Co-evaluation              End of Session    Activity Tolerance: Patient limited by  lethargy Patient left: in bed;with call bell/phone within reach;with bed alarm set   Time: 0277-4128 OT Time Calculation (min): 16 min Charges:  OT General Charges $OT Visit: 1 Procedure OT Evaluation $Initial OT Evaluation Tier I: 1 Procedure G-Codes:    Malka So 11/14/2014, 1:59 PM [phone]

## 2014-11-14 NOTE — Progress Notes (Signed)
CRITICAL VALUE ALERT  Critical value received:  Troponin 0.60  Date of notification:  11/13/2014  Time of notification:  2014  Critical value read back: yes    Nurse who received alert:  Renita Papa   Cardiac Markers within recent range for patient. Previous value of 1.12

## 2014-11-14 NOTE — Progress Notes (Signed)
SUBJECTIVE: No chest pain or shoulder pain this am.   BP 134/46 mmHg  Pulse 65  Temp(Src) 98.1 F (36.7 C) (Oral)  Resp 20  Ht _0  (1.702 m)  Wt 192 lb 12.8 oz (87.454 kg)  BMI 30.19 kg/m2  SpO2 95% No intake or output data in the 24 hours ending 11/14/14 0805  PHYSICAL EXAM General: Well developed, well nourished, in no acute distress. Alert and oriented x 3.  Psych:  Good affect, responds appropriately Neck: No JVD. No masses noted.  Lungs: Clear bilaterally with no wheezes or rhonci noted.  Heart: RRR with no murmurs noted. Abdomen: Bowel sounds are present. Soft, non-tender.  Extremities: No lower extremity edema.   LABS: Basic Metabolic Panel:  Recent Labs  11/13/14 1846  NA 140  K 4.4  CL 105  CO2 31  GLUCOSE 105*  BUN 11  CREATININE 1.22  CALCIUM 8.7  MG 2.2   CBC:  Recent Labs  11/13/14 1846  WBC 7.3  NEUTROABS 4.2  HGB 12.5*  HCT 38.6*  MCV 99.0  PLT 257   Cardiac Enzymes:  Recent Labs  11/13/14 1846 11/14/14 0049 11/14/14 0540  TROPONINI 0.60* 0.63* 0.46*   Fasting Lipid Panel:  Recent Labs  11/14/14 0435  CHOL 154  HDL 43  LDLCALC 95  TRIG 80  CHOLHDL 3.6    Current Meds: . aspirin EC  81 mg Oral Daily  . atorvastatin  40 mg Oral q1800  . donepezil  10 mg Oral QHS  . DULoxetine  60 mg Oral Daily  . enoxaparin (LOVENOX) injection  40 mg Subcutaneous Q24H  . fluticasone  2 spray Each Nare Daily  . furosemide  40 mg Oral Daily  . isosorbide mononitrate  30 mg Oral QHS  . isosorbide mononitrate  60 mg Oral Daily  . memantine  14 mg Oral QHS  . metoprolol  50 mg Oral BID  . mirtazapine  30 mg Oral QHS  . multivitamin with minerals  1 tablet Oral Daily  . pantoprazole  40 mg Oral Daily  . QUEtiapine  50 mg Oral QHS  . ranolazine  500 mg Oral BID  . sodium chloride  3 mL Intravenous Q12H  . traMADol  50 mg Oral TID   Cardiac cath 9/14:  Left Main: Large caliber vessel, bifurcates into LAD & Circumflex,  Angiographically normal LAD: Large caliber vessel that is 100% occluded at the bifurcation with D2 at the proximal edge on the numerous stents. There is faint distal filling from the Diagonal & septal collaterals  D1: moderate caliber vessel, ostial ~50-60% stenosis, remainder is free of disease. This covers a large portion of the anterolateral wall.  D2: Small caliber vessel that takes off from an occluded stent, just beyond a major septal trunk. It does give collaterals to a distal diagonal branch that faintly fills the distal LAD. Left Circumflex: Moderate to Large caliber vessel with two small caliber OMs. It terminates as a bifurcating inferolateral OM. Angiographically normal. There is ~40% ostial disease.   RCA: Large caliber dominant vessel with a widely patent proximal stent at the initial bend. Just after the distal edge of the stent there is a ~50-60% focal stenosis in bend 2. The remainder of the vessel is free of significant disease. There are 2 major, small caliber before it bifurcates into a small caliber Right Posterior Descending Artery and the Right Posterior AV Groove Branch (RPAV) that bifurcates distally into 2 RPL branches.  ASSESSMENT AND PLAN:  Note: Pt has another chart in EPIC. The current admission is under Gavin [name] and all of his other records are under [name]   1. CAD/NSTEMI: Pt with known CAD and now presenting with shoulder pain/elevated troponin. Last cath is [name] in his other medical record under [name] Last cath 9/14 with moderate disease in the RCA and Circumflex and occluded mid LAD with filling from left to left collaterals. He has been managed medically by Dr. Gwenlyn [name] for the last 1.5 years. Plans are in place per Dr. Haroldine [name] for cardiac cath today. The patient is agreeable with this plan. I have reviewed the cath today including risks of the procedure. I have not met him before today but he does seem to have some degree of dementia.  It may be best to continue medical management if no critical lesions noted on cath. Will stop Lovenox and start IV heparin in anticipation of cath later today. Clear liquid breakfast then NPO for afternoon cath with possible PCI.   2. PAD: Stable. Followed by Dr. Gwenlyn [name]   3. PAF: Sinus today. Not felt to be a coumadin candidate.   Gavin [name]  2/22/20168:05 AM

## 2014-11-14 NOTE — Progress Notes (Signed)
ANTICOAGULATION CONSULT NOTE - Initial Consult  Pharmacy Consult for Heparin + Psych Med Review Indication: chest pain/ACS and per MD request  Allergies  Allergen Reactions  . Ciprofloxacin Other (See Comments)    Unknown allergic reaction, possible shortness of breath  . Codeine Other (See Comments)    "knocked me out"  . Dexamethasone Other (See Comments)    Unknown allergic reaction  . Sulfa Antibiotics Nausea And Vomiting  . Telmisartan Other (See Comments)    Micardis - heart races    Patient Measurements: Height: 5\' 7"  (170.2 cm) Weight: 192 lb 12.8 oz (87.454 kg) IBW/kg (Calculated) : 66.1 Heparin Dosing Weight: 84 kg  Vital Signs: Temp: 98.1 F (36.7 C) (02/22 0500) BP: 134/46 mmHg (02/22 0500) Pulse Rate: 65 (02/22 0500)  Labs:  Recent Labs  11/13/14 1846 11/14/14 0049 11/14/14 0540  HGB 12.5*  --   --   HCT 38.6*  --   --   PLT 257  --   --   LABPROT 14.6  --   --   INR 1.13  --   --   CREATININE 1.22  --   --   TROPONINI 0.60* 0.63* 0.46*    Estimated Creatinine Clearance: 47.6 mL/min (by C-G formula based on Cr of 1.22).   Medical History: No past medical history on file.  Assessment: 48 YOM with prior cardiac history including CAD s/p multiple PCIs and hx Afib who was found to have moderate disease in Sept '14 and was being managed medically. The patient presented on 2/21 with CP and pharmacy has been consulted to start heparin for anticoagulation while awaiting cardiac cath today for further evaluation today. Hep wt: 84 kg , baseline CBC okay - on no anticoagulants PTA.   The patient is noted to have received a low-dose of Lovenox 40 mg for VTE prophylaxis on 2/21 at 1730.   Pharmacy was also consulted to review the patient's psychiatric medications. The patient is currently on the following agents: Cymbalta 60 mg daily, Namzaric 14-10 mg once daily (combination tablet of memantine 14 mg and donepezil 10 mg), mirtazapine 30 mg hs, and quetiapine  50 mg hs, zaleplon 10 mg hs)  It is hard to fully determine the indications from the information available in the chart however the proposed indications are as follows: Namzaric for dementia/Alzheimer's, Cymbalta for depression, anxiety, and/or nerve pain. The low-dose remeron and quetiapine along with the zaleplon appear to be to help the patient sleep. It is hard to determine the necessity of all of these agents without knowing more information regarding the patient's neuro/psych history. Consider consulting a hospitalist and/or psychiatrist if adjustments are deemed necessary at this time.   Goal of Therapy:  Heparin level 0.3-0.7 units/ml Monitor platelets by anticoagulation protocol: Yes   Plan:  1. Heparin bolus of 4000 units x 1 2. Initiate heparin drip at a rate of 1050 units/hr (10.5 ml/hr) 3. Will continue to monitor for any signs/symptoms of bleeding and will follow up with heparin level in 8 hours or post-cath 4. Pharmacy will s/o of consult for psych med review - see information listed above.   Alycia Rossetti, PharmD, BCPS Clinical Pharmacist Pager: [phone] 11/14/2014 8:35 AM

## 2014-11-14 NOTE — CV Procedure (Signed)
    Cardiac Catheterization Procedure Note  Name: Gavin [name] MRN: [id number] DOB: 1929/01/03  Procedure: Left Heart Cath, Selective Coronary Angiography, LV angiography, PTCA and stenting of the mid-RCA  Indication: NSTEMI  Procedural Details:  The right wrist was prepped, draped, and anesthetized with 1% lidocaine. Using the modified Seldinger technique, a 5/6 French Slender sheath was introduced into the right radial artery. 3 mg of verapamil was administered through the sheath, weight-based unfractionated heparin was administered intravenously. Standard Judkins catheters were used for selective coronary angiography and left ventriculography. Catheter exchanges were performed over an exchange length guidewire.  PROCEDURAL FINDINGS Hemodynamics: AO 191/71 LV 198/25   Coronary angiography: Coronary dominance: right  Left mainstem: The left main is widely patent without stenosis.  Left anterior descending (LAD): The LAD is patent proximally, but the vessel is occluded within the previously implanted stent in the mid vessel. The mid and distal LAD as well as the second diagonal branch fill late. The angiographic appearance of the LAD is unchanged from previous cardiac catheterization studies. The first diagonal is large in caliber and it has a 50% ostial stenosis.  Left circumflex (LCx): The circumflex is relatively small in caliber and distribution. The vessel is patent throughout.  Right coronary artery (RCA): The RCA is dominant. The proximal stented segment is patent. The mid vessel has an eccentric 80% stenosis. The distal vessel, PDA, and PLA branches are patent. The stenosis in the mid vessel has clearly progressed from the previous study in 2014.  Left ventriculography: The apex is hypokinetic. The other LV wall segments contract normally. The LVEF is estimated at 45-50%.   PCI Note:  Following the diagnostic procedure, the decision was made to proceed with PCI of the RCA.   Weight-based bivalirudin was given for anticoagulation. We tried to load the patient was brilinta 180 mg on the table. He was unable to swallow pills. Therefore, intravenous cangrelor was administered. Once a therapeutic ACT was achieved, a 6 [place] JR4 guide catheter was inserted.  A cougar coronary guidewire was used to cross the lesion.  The lesion was predilated with a 2.5 mm balloon.  The lesion was then stented with a 30x20 mm Promus DES.  The stent was postdilated with a 3.25 mm noncompliant balloon.  Following PCI, there was 0% residual stenosis and TIMI-3 flow. Final angiography confirmed an excellent result. The patient tolerated the procedure well. There were no immediate procedural complications. A TR band was used for radial hemostasis. The patient was transferred to the post catheterization recovery area for further monitoring.  PCI Data: Vessel - RCA/Segment - mid Percent Stenosis (pre)  80 TIMI-flow 3 Stent 3.0x20 mm Promus DES Percent Stenosis (post) 0 TIMI-flow (post) 3  Contrast: 80  Estimated Blood Loss: minimal  Final Conclusions:   1. Two vessel CAD with chronic total occlusion of the LAD and severe de novo stenosis of the RCA 2. Mild segmental LV contraction abnormality, LVEF 45-50% 3. Successful PCI of the RCA   Recommendations:  Continue DAPT with ASA and brilinta x 12 months. Should be ok for discharge tomorrow am.  Sherren Mocha MD, [facility] 11/14/2014, 6:00 PM

## 2014-11-15 ENCOUNTER — Inpatient Hospital Stay (HOSPITAL_COMMUNITY): Admission: RE | Admit: 2014-11-15 | Payer: Medicare Other | Source: Ambulatory Visit

## 2014-11-15 ENCOUNTER — Telehealth: Payer: Self-pay | Admitting: Nurse Practitioner

## 2014-11-15 ENCOUNTER — Encounter (HOSPITAL_COMMUNITY): Payer: Self-pay | Admitting: Cardiovascular Disease

## 2014-11-15 DIAGNOSIS — I10 Essential (primary) hypertension: Secondary | ICD-10-CM

## 2014-11-15 DIAGNOSIS — E785 Hyperlipidemia, unspecified: Secondary | ICD-10-CM

## 2014-11-15 DIAGNOSIS — F0390 Unspecified dementia without behavioral disturbance: Secondary | ICD-10-CM

## 2014-11-15 DIAGNOSIS — G4733 Obstructive sleep apnea (adult) (pediatric): Secondary | ICD-10-CM

## 2014-11-15 DIAGNOSIS — I480 Paroxysmal atrial fibrillation: Secondary | ICD-10-CM

## 2014-11-15 LAB — CBC
HCT: 35.8 % — ABNORMAL LOW (ref 39.0–52.0)
Hemoglobin: 11.9 g/dL — ABNORMAL LOW (ref 13.0–17.0)
MCH: 32 pg (ref 26.0–34.0)
MCHC: 33.2 g/dL (ref 30.0–36.0)
MCV: 96.2 fL (ref 78.0–100.0)
PLATELETS: 255 10*3/uL (ref 150–400)
RBC: 3.72 MIL/uL — ABNORMAL LOW (ref 4.22–5.81)
RDW: 14.8 % (ref 11.5–15.5)
WBC: 9.4 10*3/uL (ref 4.0–10.5)

## 2014-11-15 LAB — BASIC METABOLIC PANEL
Anion gap: 6 (ref 5–15)
BUN: 12 mg/dL (ref 6–23)
CO2: 26 mmol/L (ref 19–32)
CREATININE: 1.18 mg/dL (ref 0.50–1.35)
Calcium: 8.5 mg/dL (ref 8.4–10.5)
Chloride: 106 mmol/L (ref 96–112)
GFR calc non Af Amer: 55 mL/min — ABNORMAL LOW (ref >90)
GFR, EST AFRICAN AMERICAN: 63 mL/min — AB (ref >90)
Glucose, Bld: 105 mg/dL — ABNORMAL HIGH (ref 70–99)
Potassium: 4 mmol/L (ref 3.5–5.1)
Sodium: 138 mmol/L (ref 135–145)

## 2014-11-15 MED ORDER — TICAGRELOR 90 MG PO TABS: 90.0000 mg | ORAL_TABLET | Freq: Two times a day (BID) | ORAL | Status: DC

## 2014-11-15 NOTE — Telephone Encounter (Signed)
I did not see recent hospitalization noted in chart but placed call to pt to check on him.  Spoke with family member who answered home number. Family member reports pt is currently in hospital with discharge planned for today.  Reviewed and pt has another [phone]. I told family we will contact pt tomorrow for follow up.

## 2014-11-15 NOTE — Progress Notes (Signed)
Pt unable to walk in hall with PT. No family present and pt confused. Spoke with RN who will address his new meds. Will not follow pt. Gavin [name] CES, ACSM 10:32 AM 11/15/2014

## 2014-11-15 NOTE — Telephone Encounter (Signed)
New message      TCM appt on 11-21-14 per [email or web address]

## 2014-11-15 NOTE — Discharge Instructions (Signed)

## 2014-11-15 NOTE — Care Management Note (Signed)
    Page 1 of 2   11/15/2014     2:27:52 PM CARE MANAGEMENT NOTE 11/15/2014  Patient:  ADAR, RASE   Account Number:  [id number]  Date Initiated:  11/15/2014  Documentation initiated by:  Kaylani Fromme  Subjective/Objective Assessment:   Pt adm on 11/13/14 with NSTEMI s/p PCI.  PTA, pt resides at home with spouse.     Action/Plan:   PT recommending SNF at dc, but wife wishes to take pt home with [facility].  Has used [facility] in the past and wishes to use again.  Requests BSC and rollator for home.   Anticipated DC Date:  11/15/2014   Anticipated DC Plan:  [place] [facility]  CM consult      [facility] Choice  HOME HEALTH   Choice offered to / List presented to:  C-3 Spouse   DME arranged  [place]      DME agency  [place] arranged  HH-1 RN  [place]      [place] agency  [place]   Status of service:  Completed, signed off Medicare Important Message given?  NA - LOS <3 / Initial given by admissions (If response is "NO", the following Medicare IM given date fields will be blank) Date Medicare IM given:   Medicare IM given by:   Date Additional Medicare IM given:   Additional Medicare IM given by:    Discharge Disposition:  [place]  Per UR Regulation:  Reviewed for med. necessity/level of care/duration of stay  If discussed at [place] of Stay Meetings, dates discussed:    Comments:  11/15/14 Ellan Lambert, RN, BSN [phone] Pt to dc on Brilinta; wife given booklet with free 30 day trial card.

## 2014-11-15 NOTE — Discharge Summary (Signed)
Discharge Summary   Patient ID: Gavin [name],  MRN: [id number], DOB/AGE: 1929/08/06 81 y.o.  Admit date: 11/13/2014 Discharge date: 11/15/2014  Primary Care Provider: No primary care provider on file. Primary Cardiologist: Adora Fridge, MD   Discharge Diagnoses Principal Problem:   NSTEMI, initial episode of care  **S/P PCI/DES of the RCA this admission.  Active Problems:   CAD in native artery   HTN (hypertension)   PAF (paroxysmal atrial fibrillation)  **No anticoagulation secondary to h/o falls.   Rotator cuff tear arthropathy of right shoulder   Hyperlipidemia   OSA (obstructive sleep apnea)   Dementia  Allergies Allergies  Allergen Reactions  . Ciprofloxacin Other (See Comments)    Unknown allergic reaction, possible shortness of breath  . Codeine Other (See Comments)    "knocked me out"  . Dexamethasone Other (See Comments)    Unknown allergic reaction  . Sulfa Antibiotics Nausea And Vomiting  . Telmisartan Other (See Comments)    Micardis - heart races   Procedures  Cardiac Catheterization and Percutaneous Coronary Intervention [id number]  PROCEDURAL FINDINGS Hemodynamics: AO 191/71 LV 198/25              Coronary angiography: Coronary dominance: right  Left mainstem: The left main is widely patent without stenosis.  Left anterior descending (LAD): The LAD is patent proximally, but the vessel is occluded within the previously implanted stent in the mid vessel. The mid and distal LAD as well as the second diagonal branch fill late. The angiographic appearance of the LAD is unchanged from previous cardiac catheterization studies. The first diagonal is large in caliber and it has a 50% ostial stenosis.  Left circumflex (LCx): The circumflex is relatively small in caliber and distribution. The vessel is patent throughout.  Right coronary artery (RCA): The RCA is dominant. The proximal stented segment is patent. The mid vessel has an eccentric 80% stenosis. The  distal vessel, PDA, and PLA branches are patent. The stenosis in the mid vessel has clearly progressed from the previous study in 2014.   **The right coronary artery was successfully stented using a 3.0 x 20 mm Promus drug-eluting stent.**  Left ventriculography: The apex is hypokinetic. The other LV wall segments contract normally. The LVEF is estimated at 45-50%.  _____________   History of Present Illness  81 year old male with prior history of coronary artery disease status post multiple percutaneous interventions. He also has a history of peripheral arterial disease, hypertension, hyperlipidemia, obstructive apnea, mild dementia, and paroxysmal atrial fibrillation. He is not on chronic oral anticoagulation secondary to history of multiple falls. On February 20, he presented to [facility] with severe right shoulder pain was worse with moving his arm. Despite seemingly musculoskeletal arm and chest pain, he was noted to have an elevation in his troponin with an eventual peak of 1.12. ECG showed inferior and anterolateral T-wave inversions which were new. Cardiology was consult and heat and he was subsequent transferred to [facility] for further evaluation.  Hospital Course  Patient continues to complain of intermittent chest and shoulder discomfort. Given the objective evidence of ischemia, decision was made to pursue diagnostic heart catheterization. This was performed on February 22, revealing a new 80% eccentric stenosis within the mid RCA. The LAD and left circumflex were patent. EF was 45-50%. The right coronary artery was successfully stented using a 3.0 x 20 m Promus drug-eluting stent. Patient tolerated procedure well and postprocedure has ambulated without significant chest discomfort. He has continued to complain of shoulder  discomfort. He will be discharged home today in good condition with early follow-up arranged for next week.  Discharge Vitals Blood pressure 162/83, pulse 63,  temperature 97.9 F (36.6 C), temperature source Oral, resp. rate 18, height 5\' 7"  (1.702 m), weight 191 lb 12.8 oz (87 kg), SpO2 97 %.  Filed Weights   11/13/14 1608 11/14/14 0500 11/15/14 0045  Weight: 193 lb 11.2 oz (87.862 kg) 192 lb 12.8 oz (87.454 kg) 191 lb 12.8 oz (87 kg)    Labs  CBC  Recent Labs  11/13/14 1846 11/15/14 0419  WBC 7.3 9.4  NEUTROABS 4.2  --   HGB 12.5* 11.9*  HCT 38.6* 35.8*  MCV 99.0 96.2  PLT 257 950   Basic Metabolic Panel  Recent Labs  11/13/14 1846 11/15/14 0419  NA 140 138  K 4.4 4.0  CL 105 106  CO2 31 26  GLUCOSE 105* 105*  BUN 11 12  CREATININE 1.22 1.18  CALCIUM 8.7 8.5  MG 2.2  --    Liver Function Tests  Recent Labs  11/13/14 1846  AST 21  ALT 13  ALKPHOS 76  BILITOT 0.9  PROT 5.7*  ALBUMIN 3.1*   Cardiac Enzymes  Recent Labs  11/13/14 1846 11/14/14 0049 11/14/14 0540  TROPONINI 0.60* 0.63* 0.46*   Fasting Lipid Panel  Recent Labs  11/14/14 0435  CHOL 154  HDL 43  LDLCALC 95  TRIG 80  CHOLHDL 3.6    Disposition  Pt is being discharged home today in good condition.  Follow-up Plans & Appointments  Follow-up Information    Follow up with Murray Hodgkins, NP On 11/21/2014.   Specialty:  Nurse Practitioner   Why:  8:30 AM - Dr. Kennon Holter NP   Contact information:   [address] [place] [address] [phone]       Discharge Medications    Medication List    TAKE these medications        ALAWAY OP  Place 1 drop into both eyes 2 (two) times daily as needed (dry eyes).     albuterol 108 (90 BASE) MCG/ACT inhaler  Commonly known as:  PROVENTIL HFA;VENTOLIN HFA  Inhale into the lungs every 6 (six) hours as needed for wheezing or shortness of breath. ProAir     aspirin EC 81 MG tablet  Take 81 mg by mouth at bedtime.     atorvastatin 40 MG tablet  Commonly known as:  LIPITOR  Take 40 mg by mouth at bedtime.     BIOFREEZE EX  Apply 1 application topically daily  as needed (pain).     cetirizine 10 MG tablet  Commonly known as:  ZYRTEC  Take 10 mg by mouth daily.     DULoxetine 60 MG capsule  Commonly known as:  CYMBALTA  Take 60 mg by mouth daily.     furosemide 40 MG tablet  Commonly known as:  LASIX  Take 40 mg by mouth daily as needed for fluid or edema.     isosorbide mononitrate 30 MG 24 hr tablet  Commonly known as:  IMDUR  Take 30 mg by mouth 2 (two) times daily.     Memantine HCl-Donepezil HCl 14-10 MG Cp24  Take 1 capsule by mouth daily.     metoprolol 50 MG tablet  Commonly known as:  LOPRESSOR  25-50 mg 2 (two) times daily. Take 1/2 tablet (25 mg) every morning and 1 tablet (50 mg) at night     mirtazapine  30 MG tablet  Commonly known as:  REMERON  Take 30 mg by mouth daily before supper.     mometasone 50 MCG/ACT nasal spray  Commonly known as:  NASONEX  Place 1 spray into both nostrils at bedtime.     multivitamin with minerals Tabs tablet  Take 1 tablet by mouth daily before supper.     mupirocin ointment 2 %  Commonly known as:  BACTROBAN  Apply 1 application topically at bedtime. Apply to toe     nitroGLYCERIN 0.4 MG SL tablet  Commonly known as:  NITROSTAT  Place 0.4 mg under the tongue every 5 (five) minutes as needed for chest pain.     pantoprazole 40 MG tablet  Commonly known as:  PROTONIX  Take 40 mg by mouth daily.     PRESERVISION AREDS PO  Take 1 tablet by mouth 2 (two) times daily.     QUEtiapine 50 MG tablet  Commonly known as:  SEROQUEL  Take 50 mg by mouth daily before supper.     ranolazine 500 MG 12 hr tablet  Commonly known as:  RANEXA  Take 500 mg by mouth 2 (two) times daily.     ticagrelor 90 MG Tabs tablet  Commonly known as:  BRILINTA  Take 1 tablet (90 mg total) by mouth 2 (two) times daily.     vitamin B-12 1000 MCG tablet  Commonly known as:  CYANOCOBALAMIN  Take 1,000 mcg by mouth daily before supper.     Vitamin D 2000 UNITS Caps  Take 2,000 Units by mouth daily  before supper.     zaleplon 10 MG capsule  Commonly known as:  SONATA  Take 10 mg by mouth at bedtime as needed for sleep.       Outstanding Labs/Studies  None  Duration of Discharge Encounter   Greater than 30 minutes including physician time.  Signed, Murray Hodgkins NP 11/15/2014, 8:54 AM

## 2014-11-15 NOTE — Progress Notes (Signed)
Physical Therapy Treatment Patient Details Name: Gavin [name] MRN: [id number] DOB: Sep 27, 1928 Today's Date: 11/15/2014    History of Present Illness Pt admit with NSTEMI.  For CATH Mon 2/22.  Has right rotator cuff tear as well.      PT Comments    Pt requiring 2 person (A) today. Pt confused and requiring max multimodal cues for sequencing and safety. No family present. Uncertain if family can provide amount of (A) required at this time. Will cont to follow per POC.   Follow Up Recommendations  SNF;Supervision/Assistance - 24 hour;Other (comment) (uncertain if family can provide 24/7 (A) )     Equipment Recommendations  None recommended by PT    Recommendations for Other Services       Precautions / Restrictions Precautions Precautions: Fall Restrictions Weight Bearing Restrictions: No    Mobility  Bed Mobility Overal bed mobility: Needs Assistance Bed Mobility: Supine to Sit;Sit to Supine     Supine to sit: Mod assist;HOB elevated Sit to supine: Mod assist   General bed mobility comments: pt with difficulty managing LEs off and onto bed; mod (A) to perform bed mobility  with max cues for sequencing  Transfers Overall transfer level: Needs assistance Equipment used: 2 person hand held assist Transfers: Sit to/from Stand;Stand Pivot Transfers Sit to Stand: +2 safety/equipment;Mod assist Stand pivot transfers: +2 safety/equipment;Mod assist       General transfer comment: pt with heavy lean posteriorly; recommend 2 person (A) for safety; max multimodal cues for hand placement and safety with transfers ; pt with hips flexed and unsteady short shuffled steps  Ambulation/Gait             General Gait Details: performed pivotal steps and lateral steps only    Stairs            Wheelchair Mobility    Modified Rankin (Stroke Patients Only)       Balance Overall balance assessment: Needs assistance Sitting-balance support: Feet supported;No upper  extremity supported;Single extremity supported Sitting balance-Leahy Scale: Poor Sitting balance - Comments: pt with multiple bouts of LOB while sitting EOB Postural control: Posterior lean Standing balance support: During functional activity;Bilateral upper extremity supported Standing balance-Leahy Scale: Poor Standing balance comment: required (A) to maintain balance                     Cognition Arousal/Alertness: Awake/alert Behavior During Therapy: Agitated;Impulsive;Flat affect Overall Cognitive Status: No family/caregiver present to determine baseline cognitive functioning Area of Impairment: Orientation;Attention;Memory;Safety/judgement;Problem solving Orientation Level: Disoriented to;Situation;Time;Place Current Attention Level: Focused Memory: Decreased short-term memory   Safety/Judgement: Decreased awareness of safety;Decreased awareness of deficits   Problem Solving: Slow processing;Difficulty sequencing;Requires verbal cues;Requires tactile cues General Comments: no family present; pt confused and agitated, restless while in bed    Exercises      General Comments        Pertinent Vitals/Pain Pain Assessment: No/denies pain    Home Living                      Prior Function            PT Goals (current goals can now be found in the care plan section) Acute Rehab PT Goals Patient Stated Goal: pt did not state PT Goal Formulation: With patient Time For Goal Achievement: 11/21/14 Potential to Achieve Goals: Good Progress towards PT goals: Progressing toward goals    Frequency  Min 3X/week    PT Plan Current plan  remains appropriate    Co-evaluation             End of Session Equipment Utilized During Treatment: Gait belt Activity Tolerance: Patient limited by fatigue Patient left: in bed;with call bell/phone within reach;with bed alarm set     Time: 7614-7092 PT Time Calculation (min) (ACUTE ONLY): 18 min  Charges:   $Therapeutic Activity: 8-22 mins                    G CodesGustavus Bryant, [place] [phone] 11/15/2014, 9:30 AM

## 2014-11-15 NOTE — Progress Notes (Signed)
Patient Name: Gavin [name] Date of Encounter: 11/15/2014     Principal Problem:   NSTEMI, initial episode of care Active Problems:   CAD in native artery   HTN (hypertension)   PAF (paroxysmal atrial fibrillation)   Rotator cuff tear arthropathy of right shoulder   Hyperlipidemia   OSA (obstructive sleep apnea)   Dementia    SUBJECTIVE  Mild left sided c/p overnight.  No dyspnea.  Wrist wrapped in pressure dsg - bruised.  CURRENT MEDS . aspirin EC  81 mg Oral Daily  . atorvastatin  40 mg Oral q1800  . donepezil  10 mg Oral QHS  . DULoxetine  60 mg Oral Daily  . fluticasone  2 spray Each Nare Daily  . furosemide  40 mg Oral Daily  . isosorbide mononitrate  30 mg Oral QHS  . isosorbide mononitrate  60 mg Oral Daily  . memantine  14 mg Oral QHS  . metoprolol  50 mg Oral BID  . mirtazapine  30 mg Oral QHS  . multivitamin with minerals  1 tablet Oral Daily  . pantoprazole  40 mg Oral Daily  . QUEtiapine  50 mg Oral QHS  . ranolazine  500 mg Oral BID  . sodium chloride  3 mL Intravenous Q12H  . ticagrelor  90 mg Oral BID  . traMADol  50 mg Oral TID    OBJECTIVE  Filed Vitals:   11/14/14 2325 11/15/14 0045 11/15/14 0100 11/15/14 0412  BP: 120/99 147/77 124/89 162/83  Pulse: 70 71 69 63  Temp:  97.8 F (36.6 C)  97.9 F (36.6 C)  TempSrc:  Oral  Oral  Resp:  15  18  Height:      Weight:  191 lb 12.8 oz (87 kg)    SpO2: 96% 97% 95% 97%    Intake/Output Summary (Last 24 hours) at 11/15/14 0747 Last data filed at 11/15/14 0729  Gross per 24 hour  Intake 906.79 ml  Output   2000 ml  Net -1093.21 ml   Filed Weights   11/13/14 1608 11/14/14 0500 11/15/14 0045  Weight: 193 lb 11.2 oz (87.862 kg) 192 lb 12.8 oz (87.454 kg) 191 lb 12.8 oz (87 kg)    PHYSICAL EXAM  General: Pleasant, NAD. Neuro: disoriented to time/place. Moves all extremities spontaneously. Psych: flat affect. HEENT:  Normal  Neck: Supple without bruits or JVD. Lungs:  Resp regular and  unlabored, CTA, somewhat diminished @ bases. Heart: RRR, distant, no s3, s4, or murmurs. Abdomen: Soft, non-tender, non-distended, BS + x 4.  Extremities: No clubbing, cyanosis or edema. DP/PT/Radials 2+ and equal bilaterally.  r wrist ecchymotic w/o bleeding/bruit/hematoma.  Accessory Clinical Findings  CBC  Recent Labs  11/13/14 1846 11/15/14 0419  WBC 7.3 9.4  NEUTROABS 4.2  --   HGB 12.5* 11.9*  HCT 38.6* 35.8*  MCV 99.0 96.2  PLT 257 875   Basic Metabolic Panel  Recent Labs  11/13/14 1846  NA 140  K 4.4  CL 105  CO2 31  GLUCOSE 105*  BUN 11  CREATININE 1.22  CALCIUM 8.7  MG 2.2   Liver Function Tests  Recent Labs  11/13/14 1846  AST 21  ALT 13  ALKPHOS 76  BILITOT 0.9  PROT 5.7*  ALBUMIN 3.1*   Cardiac Enzymes  Recent Labs  11/13/14 1846 11/14/14 0049 11/14/14 0540  TROPONINI 0.60* 0.63* 0.46*   Fasting Lipid Panel  Recent Labs  11/14/14 0435  CHOL 154  HDL 43  LDLCALC  95  TRIG 80  CHOLHDL 3.6   TELE  Rsr, 4 beats nsvt.  ECG  Rsr, 65, nonspecific t changes.  Radiology/Studies  No results found.  ASSESSMENT AND PLAN  1.  NSTEMI/CAD:  S/p PCI/DES to the mid RCA yesterday.  Mild c/p overnight/this AM.  No objective evidence of ischemia.  VSS.  Cardiac rehab to see this AM.  Cont asa, brilinta, bb, statin, nitrate, ranexa.  Ambulate - possible d/c this AM. Lives with his wife in [place].  2.  HTN:  BP mostly trending ok though more elevated this AM.  F/U after AM meds.  Cont bb/nitrate.  3.  HL:  LDL 95.  Cont statin therapy.  4.  OSA:  On CPAP.  5.  PAF:  No anticoag 2/2 falls.  6.  NSVT:  4 beats noted on tele.  Cont bb.  Signed, Murray Hodgkins NP  I have personally seen and examined this patient with Gavin Bayley, NP.  I agree with the assessment and plan as outlined above. He is admitted with NSTEMI and had a cardiac cath 11/14/14 showing severe mid RCA stenosis, now s/p DES x 1 mid RCA. Will continue dual  anti-platelet therapy with ASA and Brilinta. Discharge home later if ambulating.   MCALHANY,CHRISTOPHER 11/15/2014 8:35 AM

## 2014-11-16 DIAGNOSIS — I10 Essential (primary) hypertension: Secondary | ICD-10-CM | POA: Diagnosis not present

## 2014-11-16 DIAGNOSIS — I509 Heart failure, unspecified: Secondary | ICD-10-CM | POA: Diagnosis not present

## 2014-11-16 DIAGNOSIS — F0281 Dementia in other diseases classified elsewhere with behavioral disturbance: Secondary | ICD-10-CM | POA: Diagnosis not present

## 2014-11-16 DIAGNOSIS — I214 Non-ST elevation (NSTEMI) myocardial infarction: Secondary | ICD-10-CM | POA: Diagnosis not present

## 2014-11-16 DIAGNOSIS — M1990 Unspecified osteoarthritis, unspecified site: Secondary | ICD-10-CM | POA: Diagnosis not present

## 2014-11-16 NOTE — Telephone Encounter (Signed)
Patient contacted regarding discharge from [facility] on November 15, 2014.  Patient understands to follow up with provider Ignacia Bayley, NP on November 21, 2014 at 8:30AM at [facility]. Patient understands discharge instructions? yes Patient understands medications and regiment? yes Patient understands to bring all medications to this visit? yes  Spoke with pt's wife with pt's permission.  He preferred that I speak with her.

## 2014-11-17 DIAGNOSIS — I10 Essential (primary) hypertension: Secondary | ICD-10-CM | POA: Diagnosis not present

## 2014-11-17 DIAGNOSIS — I509 Heart failure, unspecified: Secondary | ICD-10-CM | POA: Diagnosis not present

## 2014-11-17 DIAGNOSIS — M1990 Unspecified osteoarthritis, unspecified site: Secondary | ICD-10-CM | POA: Diagnosis not present

## 2014-11-17 DIAGNOSIS — F0281 Dementia in other diseases classified elsewhere with behavioral disturbance: Secondary | ICD-10-CM | POA: Diagnosis not present

## 2014-11-17 DIAGNOSIS — I214 Non-ST elevation (NSTEMI) myocardial infarction: Secondary | ICD-10-CM | POA: Diagnosis not present

## 2014-11-18 DIAGNOSIS — I10 Essential (primary) hypertension: Secondary | ICD-10-CM | POA: Diagnosis not present

## 2014-11-18 DIAGNOSIS — F0281 Dementia in other diseases classified elsewhere with behavioral disturbance: Secondary | ICD-10-CM | POA: Diagnosis not present

## 2014-11-18 DIAGNOSIS — I509 Heart failure, unspecified: Secondary | ICD-10-CM | POA: Diagnosis not present

## 2014-11-18 DIAGNOSIS — M1990 Unspecified osteoarthritis, unspecified site: Secondary | ICD-10-CM | POA: Diagnosis not present

## 2014-11-18 DIAGNOSIS — I214 Non-ST elevation (NSTEMI) myocardial infarction: Secondary | ICD-10-CM | POA: Diagnosis not present

## 2014-11-21 ENCOUNTER — Encounter: Payer: Self-pay | Admitting: Nurse Practitioner

## 2014-11-21 ENCOUNTER — Ambulatory Visit (INDEPENDENT_AMBULATORY_CARE_PROVIDER_SITE_OTHER): Payer: Medicare Other | Admitting: Nurse Practitioner

## 2014-11-21 VITALS — BP 150/76 | HR 60 | Ht 67.0 in | Wt 194.2 lb

## 2014-11-21 DIAGNOSIS — I222 Subsequent non-ST elevation (NSTEMI) myocardial infarction: Secondary | ICD-10-CM

## 2014-11-21 DIAGNOSIS — I214 Non-ST elevation (NSTEMI) myocardial infarction: Secondary | ICD-10-CM | POA: Diagnosis not present

## 2014-11-21 DIAGNOSIS — I2510 Atherosclerotic heart disease of native coronary artery without angina pectoris: Secondary | ICD-10-CM | POA: Diagnosis not present

## 2014-11-21 DIAGNOSIS — E785 Hyperlipidemia, unspecified: Secondary | ICD-10-CM | POA: Insufficient documentation

## 2014-11-21 DIAGNOSIS — I509 Heart failure, unspecified: Secondary | ICD-10-CM | POA: Diagnosis not present

## 2014-11-21 DIAGNOSIS — I10 Essential (primary) hypertension: Secondary | ICD-10-CM | POA: Diagnosis not present

## 2014-11-21 DIAGNOSIS — R079 Chest pain, unspecified: Secondary | ICD-10-CM

## 2014-11-21 DIAGNOSIS — F0281 Dementia in other diseases classified elsewhere with behavioral disturbance: Secondary | ICD-10-CM | POA: Diagnosis not present

## 2014-11-21 DIAGNOSIS — M1990 Unspecified osteoarthritis, unspecified site: Secondary | ICD-10-CM | POA: Diagnosis not present

## 2014-11-21 NOTE — Progress Notes (Signed)
Patient Name: Gavin [name] Date of Encounter: 11/21/2014  Primary Care Provider:  Ann Held, MD Primary Cardiologist:  Adora Fridge, MD   Chief Complaint  81 year old male status post recent non-STEMI and RCA stenting who presents for follow-up.  Past Medical History   Past Medical History  Diagnosis Date  . OSA (obstructive sleep apnea)     NPSG 05-05-99 RDI 65/hr-CPAP  . Chronic airway obstruction, not elsewhere classified   . Lung nodules     CT 07-01-10 stable @ [facility]  . Reflux esophagitis   . GERD (gastroesophageal reflux disease)   . Hypertension   . Hyperlipidemia   . PVD (peripheral vascular disease) 2004    stenting of both SFA's 2004  . Carotid disease, bilateral     left greater than right  . CAD (coronary artery disease)     a. 2007 STEMI: stent to LAD;  b. multiple PCI's. c. 10/2014 NSTEMI/PCI: LM nl, LAD 149m, D1 50ost, LCX nl/small, RCA 27m (3.0x20 Promus DES), EF 45-50% (this admission is under MR # [id number])  . Headache     negative work up, no change off Imdur   Past Surgical History  Procedure Laterality Date  . Right rotator cuff    . Appendectomy    . Tonsillectomy    . Coronary angioplasty with stent placement  06/13/2004    successful cutting balloon atherectomy/PIi & STENTING mid LAD & POBA of the distal LAD  . Coronary angioplasty with stent placement  06/03/2006    successful PTCA & stent mid LAD  . Coronary angioplasty with stent placement  06/06/2006    successful double stenting of the ostial and prox RCA, successful PTCA LAD of the ostium major diag branch due to jailing by a previously placed LAD stent,successful mid kiagonal branch primary stenting  . Coronary angioplasty with stent placement  09/20/2008    PTCA LAD stent  . Cardiac catheterization  10/03/2008    Occluded LAD  . US echocardiography  12/24/2011    trace MR,TR,AI,PI. EF >55%  . Nm myoview ltd  08/01/2011    Abnormal-intermed to high risk based upon per-infarct ischemia    . Carotid doppler  05/29/2012    bilateral ICA stenosis  . Cardiac catheterization  06/01/13    medical Rx  . Left heart catheterization with coronary angiogram N/A 06/01/2013    Procedure: LEFT HEART CATHETERIZATION WITH CORONARY ANGIOGRAM;  Surgeon: Leonie Man, MD;  Location: [facility] CATH LAB;  Service: Cardiovascular;  Laterality: N/A;    Allergies  Allergies  Allergen Reactions  . Ciprofloxacin     unknown  . Codeine     "knocked me out"  . Dexamethasone     unknown  . Sulfonamide Derivatives     Unknown reaction  . Telmisartan     unknown    HPI  81 year old male with prior history of coronary artery disease status post multiple percutaneous interventions. He also has a history of peripheral arterial disease, hypertension, hyperlipidemia, obstructive apnea, mild dementia, and paroxysmal atrial fibrillation. He is not on chronic oral anticoagulation secondary to history of multiple falls. On February 20, he presented to [facility] with severe right shoulder pain was worse with moving his arm. Despite seemingly musculoskeletal arm and chest pain, he was noted to have an elevation in his troponin with an eventual peak of 1.12. ECG showed inferior and anterolateral T-wave inversions which were new.   He was admitted and underwent diagnostic cardiac catheterization. This was performed on  February 22, revealing a new 80% eccentric stenosis within the mid RCA. The LAD and left circumflex were patent. EF was 45-50%. The right coronary artery was successfully stented using a 3.0 x 20 m Promus drug-eluting stent. He was discharged the following day. Since discharge, he has had no recurrence of chest pain or dyspnea. He has has some bruising over his right radial cath site though this is otherwise asymptomatic. He denies PND, orthopnea, dizziness, syncopal, edema, or early satiety.  Home Medications  Prior to Admission medications   Medication Sig Start Date End Date Taking? Authorizing  Provider  acetaminophen (TYLENOL) 325 MG tablet Take 2 tablets (650 mg total) by mouth every 4 (four) hours as needed. 06/02/13  Yes Erlene Quan, PA-C  aspirin 81 MG tablet Take 81 mg by mouth daily.   Yes Historical Provider, MD  atorvastatin (LIPITOR) 40 MG tablet take 1 tablet by mouth at bedtime 09/19/13  Yes Lorretta Harp, MD  BRILINTA 90 MG TABS tablet Take 90 mg by mouth 2 (two) times daily. 11/15/14  Yes Historical Provider, MD  cetirizine (ZYRTEC) 10 MG tablet Take 10 mg by mouth daily.   Yes Historical Provider, MD  Cholecalciferol (VITAMIN D) 2000 UNITS CAPS Take 1 capsule by mouth daily.   Yes Historical Provider, MD  diclofenac sodium (VOLTAREN) 1 % GEL Apply 2 g topically daily as needed (pain).   Yes Historical Provider, MD  DULoxetine (CYMBALTA) 60 MG capsule Take 60 mg by mouth daily.   Yes Historical Provider, MD  furosemide (LASIX) 40 MG tablet Take 1 tablet (40 mg total) by mouth as needed. 09/19/14  Yes Lorretta Harp, MD  isosorbide mononitrate (IMDUR) 30 MG 24 hr tablet Take 30 mg by mouth 2 (two) times daily. 06/02/13  Yes Brittainy Erie Noe, PA-C  metoprolol (LOPRESSOR) 50 MG tablet Take 50 mg by mouth 2 (two) times daily.   Yes Historical Provider, MD  mirtazapine (REMERON) 30 MG tablet Take 30 mg by mouth at bedtime. 10/28/14  Yes Historical Provider, MD  Misc Natural Products (LUTEIN VISION BLEND PO) Take 1 tablet by mouth 2 (two) times daily.   Yes Hurman Horn, MD  Multiple Vitamin (MULTIVITAMIN) tablet Take 1 tablet by mouth daily.   Yes Historical Provider, MD  NAMZARIC 14-10 MG CP24 Take by mouth daily. 11/03/14  Yes Historical Provider, MD  NASONEX 50 MCG/ACT nasal spray Place 2 sprays into the nose 2 (two) times daily.  08/20/11  Yes Historical Provider, MD  NITROSTAT 0.4 MG SL tablet place 1 tablet under the tongue if needed every 5 minutes for chest pain for 3 doses IF NO RELIEF AFTER 3RD DOSE CALL 911. 09/29/14  Yes Lorretta Harp, MD  pantoprazole  (PROTONIX) 40 MG tablet take 1 tablet by mouth once daily 11/02/14  Yes Lorretta Harp, MD  QUEtiapine (SEROQUEL) 25 MG tablet Take 50 mg by mouth at bedtime. 06/27/14  Yes Historical Provider, MD  RANEXA 500 MG 12 hr tablet take 1 tablet by mouth twice a day   Yes Lorretta Harp, MD  sertraline (ZOLOFT) 50 MG tablet Take 50 mg by mouth every morning.   Yes Historical Provider, MD  traMADol (ULTRAM) 50 MG tablet Take 1 tablet by mouth as needed. 06/22/14  Yes Historical Provider, MD  vitamin B-12 (CYANOCOBALAMIN) 1000 MCG tablet Take 1,000 mcg by mouth daily.   Yes Historical Provider, MD    Review of Systems  As above, doing well.  He  denies chest pain, palpitations, dyspnea, pnd, orthopnea, n, v, dizziness, syncope, edema, weight gain, or early satiety.  All other systems reviewed and are otherwise negative except as noted above.  Physical Exam  VS:  BP 150/76 mmHg  Pulse 60  Ht 5\' 7"  (1.702 m)  Wt 194 lb 3.2 oz (88.089 kg)  BMI 30.41 kg/m2 , BMI Body mass index is 30.41 kg/(m^2). GEN: Well nourished, well developed, in no acute distress. HEENT: normal. Neck: Supple, no JVD, carotid bruits, or masses. Cardiac: RRR, no murmurs, rubs, or gallops. No clubbing, cyanosis, edema.  Radials/DP/PT 2+ and equal bilaterally. Right radial catheterization site without bleeding, bruit, or hematoma. Ecchymosis is noted. Respiratory:  Respirations regular and unlabored, clear to auscultation bilaterally. GI: Soft, nontender, nondistended, BS + x 4. MS: no deformity or atrophy. Skin: warm and dry, no rash. Neuro:  Strength and sensation are intact. Psych: Normal affect.  Accessory Clinical Findings  ECG - regular sinus rhythm, 60, inferior T-wave inversion  Assessment & Plan  1.  Non-ST elevation MI, subsequent episode of care/CAD: Status post recent admission with subsequent stenting of the right coronary artery. He has had no recurrence of chest or shoulder pain since discharge. He remains on  aspirin, statin, beta blocker, Brilinta, and nitrate therapy. He is considering enrolling in cardiac rehabilitation.  2. Hypertension: Pressure is elevated today at 150/76 however he reports that it runs in the 1 teens to 120s at home. I have recommended that he continue his current regimen and continue to follow his blood pressures at home.  3. Hyperlipidemia: Continue statin therapy. LDL was 50 in July 2015. LFTs were normal at that time.  4. Peripheral arterial disease: No claudication.  Cont asa/statin.  5.  PAF: in sinus.  No OAC 2/2 h/o falls.  6. Dispo:  F/u with Dr. [name] in 2-3 months or sooner if necessary.  Murray Hodgkins, NP 11/21/2014, 2:50 PM

## 2014-11-21 NOTE — Patient Instructions (Signed)
Your physician recommends that you continue on your current medications as directed. Please refer to the Current Medication list given to you today.  Your physician recommends that you schedule a follow-up appointment in: 2 months with Dr.Berry

## 2014-11-22 ENCOUNTER — Encounter: Payer: Self-pay | Admitting: Nurse Practitioner

## 2014-11-22 DIAGNOSIS — I509 Heart failure, unspecified: Secondary | ICD-10-CM | POA: Diagnosis not present

## 2014-11-22 DIAGNOSIS — I214 Non-ST elevation (NSTEMI) myocardial infarction: Secondary | ICD-10-CM | POA: Diagnosis not present

## 2014-11-22 DIAGNOSIS — M1990 Unspecified osteoarthritis, unspecified site: Secondary | ICD-10-CM | POA: Diagnosis not present

## 2014-11-22 DIAGNOSIS — F0281 Dementia in other diseases classified elsewhere with behavioral disturbance: Secondary | ICD-10-CM | POA: Diagnosis not present

## 2014-11-22 DIAGNOSIS — I10 Essential (primary) hypertension: Secondary | ICD-10-CM | POA: Diagnosis not present

## 2014-11-23 DIAGNOSIS — I509 Heart failure, unspecified: Secondary | ICD-10-CM | POA: Diagnosis not present

## 2014-11-23 DIAGNOSIS — I214 Non-ST elevation (NSTEMI) myocardial infarction: Secondary | ICD-10-CM | POA: Diagnosis not present

## 2014-11-23 DIAGNOSIS — F0281 Dementia in other diseases classified elsewhere with behavioral disturbance: Secondary | ICD-10-CM | POA: Diagnosis not present

## 2014-11-23 DIAGNOSIS — I10 Essential (primary) hypertension: Secondary | ICD-10-CM | POA: Diagnosis not present

## 2014-11-23 DIAGNOSIS — M1990 Unspecified osteoarthritis, unspecified site: Secondary | ICD-10-CM | POA: Diagnosis not present

## 2014-11-24 DIAGNOSIS — I10 Essential (primary) hypertension: Secondary | ICD-10-CM | POA: Diagnosis not present

## 2014-11-24 DIAGNOSIS — M1990 Unspecified osteoarthritis, unspecified site: Secondary | ICD-10-CM | POA: Diagnosis not present

## 2014-11-24 DIAGNOSIS — I214 Non-ST elevation (NSTEMI) myocardial infarction: Secondary | ICD-10-CM | POA: Diagnosis not present

## 2014-11-24 DIAGNOSIS — F0281 Dementia in other diseases classified elsewhere with behavioral disturbance: Secondary | ICD-10-CM | POA: Diagnosis not present

## 2014-11-24 DIAGNOSIS — I509 Heart failure, unspecified: Secondary | ICD-10-CM | POA: Diagnosis not present

## 2014-11-25 DIAGNOSIS — I10 Essential (primary) hypertension: Secondary | ICD-10-CM | POA: Diagnosis not present

## 2014-11-25 DIAGNOSIS — I214 Non-ST elevation (NSTEMI) myocardial infarction: Secondary | ICD-10-CM | POA: Diagnosis not present

## 2014-11-25 DIAGNOSIS — M1990 Unspecified osteoarthritis, unspecified site: Secondary | ICD-10-CM | POA: Diagnosis not present

## 2014-11-25 DIAGNOSIS — F0281 Dementia in other diseases classified elsewhere with behavioral disturbance: Secondary | ICD-10-CM | POA: Diagnosis not present

## 2014-11-25 DIAGNOSIS — I509 Heart failure, unspecified: Secondary | ICD-10-CM | POA: Diagnosis not present

## 2014-11-28 DIAGNOSIS — F0781 Postconcussional syndrome: Secondary | ICD-10-CM | POA: Diagnosis not present

## 2014-11-28 DIAGNOSIS — I213 ST elevation (STEMI) myocardial infarction of unspecified site: Secondary | ICD-10-CM | POA: Diagnosis not present

## 2014-11-28 DIAGNOSIS — I10 Essential (primary) hypertension: Secondary | ICD-10-CM | POA: Diagnosis not present

## 2014-11-28 DIAGNOSIS — F0280 Dementia in other diseases classified elsewhere without behavioral disturbance: Secondary | ICD-10-CM | POA: Diagnosis not present

## 2014-11-28 DIAGNOSIS — G301 Alzheimer's disease with late onset: Secondary | ICD-10-CM | POA: Diagnosis not present

## 2014-11-28 DIAGNOSIS — M1990 Unspecified osteoarthritis, unspecified site: Secondary | ICD-10-CM | POA: Diagnosis not present

## 2014-11-28 DIAGNOSIS — F0281 Dementia in other diseases classified elsewhere with behavioral disturbance: Secondary | ICD-10-CM | POA: Diagnosis not present

## 2014-11-28 DIAGNOSIS — I509 Heart failure, unspecified: Secondary | ICD-10-CM | POA: Diagnosis not present

## 2014-11-28 DIAGNOSIS — I214 Non-ST elevation (NSTEMI) myocardial infarction: Secondary | ICD-10-CM | POA: Diagnosis not present

## 2014-11-29 DIAGNOSIS — I509 Heart failure, unspecified: Secondary | ICD-10-CM | POA: Diagnosis not present

## 2014-11-29 DIAGNOSIS — I214 Non-ST elevation (NSTEMI) myocardial infarction: Secondary | ICD-10-CM | POA: Diagnosis not present

## 2014-11-29 DIAGNOSIS — M1990 Unspecified osteoarthritis, unspecified site: Secondary | ICD-10-CM | POA: Diagnosis not present

## 2014-11-29 DIAGNOSIS — I10 Essential (primary) hypertension: Secondary | ICD-10-CM | POA: Diagnosis not present

## 2014-11-29 DIAGNOSIS — F0281 Dementia in other diseases classified elsewhere with behavioral disturbance: Secondary | ICD-10-CM | POA: Diagnosis not present

## 2014-11-30 DIAGNOSIS — I10 Essential (primary) hypertension: Secondary | ICD-10-CM | POA: Diagnosis not present

## 2014-11-30 DIAGNOSIS — I214 Non-ST elevation (NSTEMI) myocardial infarction: Secondary | ICD-10-CM | POA: Diagnosis not present

## 2014-11-30 DIAGNOSIS — M1990 Unspecified osteoarthritis, unspecified site: Secondary | ICD-10-CM | POA: Diagnosis not present

## 2014-11-30 DIAGNOSIS — I509 Heart failure, unspecified: Secondary | ICD-10-CM | POA: Diagnosis not present

## 2014-11-30 DIAGNOSIS — F0281 Dementia in other diseases classified elsewhere with behavioral disturbance: Secondary | ICD-10-CM | POA: Diagnosis not present

## 2014-12-01 ENCOUNTER — Ambulatory Visit: Payer: Medicare Other | Admitting: Internal Medicine

## 2014-12-01 DIAGNOSIS — H34811 Central retinal vein occlusion, right eye: Secondary | ICD-10-CM | POA: Diagnosis not present

## 2014-12-01 DIAGNOSIS — I509 Heart failure, unspecified: Secondary | ICD-10-CM | POA: Diagnosis not present

## 2014-12-01 DIAGNOSIS — I214 Non-ST elevation (NSTEMI) myocardial infarction: Secondary | ICD-10-CM | POA: Diagnosis not present

## 2014-12-01 DIAGNOSIS — I10 Essential (primary) hypertension: Secondary | ICD-10-CM | POA: Diagnosis not present

## 2014-12-01 DIAGNOSIS — M1990 Unspecified osteoarthritis, unspecified site: Secondary | ICD-10-CM | POA: Diagnosis not present

## 2014-12-01 DIAGNOSIS — H16421 Pannus (corneal), right eye: Secondary | ICD-10-CM | POA: Diagnosis not present

## 2014-12-01 DIAGNOSIS — F0281 Dementia in other diseases classified elsewhere with behavioral disturbance: Secondary | ICD-10-CM | POA: Diagnosis not present

## 2014-12-02 DIAGNOSIS — F0281 Dementia in other diseases classified elsewhere with behavioral disturbance: Secondary | ICD-10-CM | POA: Diagnosis not present

## 2014-12-02 DIAGNOSIS — I214 Non-ST elevation (NSTEMI) myocardial infarction: Secondary | ICD-10-CM | POA: Diagnosis not present

## 2014-12-02 DIAGNOSIS — M1990 Unspecified osteoarthritis, unspecified site: Secondary | ICD-10-CM | POA: Diagnosis not present

## 2014-12-02 DIAGNOSIS — I10 Essential (primary) hypertension: Secondary | ICD-10-CM | POA: Diagnosis not present

## 2014-12-02 DIAGNOSIS — I509 Heart failure, unspecified: Secondary | ICD-10-CM | POA: Diagnosis not present

## 2014-12-03 DIAGNOSIS — I4891 Unspecified atrial fibrillation: Secondary | ICD-10-CM | POA: Diagnosis not present

## 2014-12-03 DIAGNOSIS — R0602 Shortness of breath: Secondary | ICD-10-CM | POA: Diagnosis not present

## 2014-12-03 DIAGNOSIS — R0600 Dyspnea, unspecified: Secondary | ICD-10-CM | POA: Diagnosis not present

## 2014-12-03 DIAGNOSIS — I10 Essential (primary) hypertension: Secondary | ICD-10-CM | POA: Diagnosis not present

## 2014-12-03 DIAGNOSIS — M1990 Unspecified osteoarthritis, unspecified site: Secondary | ICD-10-CM | POA: Diagnosis not present

## 2014-12-03 DIAGNOSIS — I509 Heart failure, unspecified: Secondary | ICD-10-CM | POA: Diagnosis not present

## 2014-12-03 DIAGNOSIS — E780 Pure hypercholesterolemia: Secondary | ICD-10-CM | POA: Diagnosis not present

## 2014-12-03 DIAGNOSIS — Z7982 Long term (current) use of aspirin: Secondary | ICD-10-CM | POA: Diagnosis not present

## 2014-12-03 DIAGNOSIS — J90 Pleural effusion, not elsewhere classified: Secondary | ICD-10-CM | POA: Diagnosis not present

## 2014-12-05 DIAGNOSIS — F0281 Dementia in other diseases classified elsewhere with behavioral disturbance: Secondary | ICD-10-CM | POA: Diagnosis not present

## 2014-12-05 DIAGNOSIS — I214 Non-ST elevation (NSTEMI) myocardial infarction: Secondary | ICD-10-CM | POA: Diagnosis not present

## 2014-12-05 DIAGNOSIS — M1990 Unspecified osteoarthritis, unspecified site: Secondary | ICD-10-CM | POA: Diagnosis not present

## 2014-12-05 DIAGNOSIS — I10 Essential (primary) hypertension: Secondary | ICD-10-CM | POA: Diagnosis not present

## 2014-12-05 DIAGNOSIS — I509 Heart failure, unspecified: Secondary | ICD-10-CM | POA: Diagnosis not present

## 2014-12-06 ENCOUNTER — Ambulatory Visit (INDEPENDENT_AMBULATORY_CARE_PROVIDER_SITE_OTHER): Payer: Medicare Other | Admitting: Internal Medicine

## 2014-12-06 ENCOUNTER — Encounter: Payer: Self-pay | Admitting: Internal Medicine

## 2014-12-06 VITALS — BP 126/76 | HR 56 | Ht 67.0 in | Wt 194.4 lb

## 2014-12-06 DIAGNOSIS — I214 Non-ST elevation (NSTEMI) myocardial infarction: Secondary | ICD-10-CM

## 2014-12-06 DIAGNOSIS — G4733 Obstructive sleep apnea (adult) (pediatric): Secondary | ICD-10-CM

## 2014-12-06 DIAGNOSIS — J449 Chronic obstructive pulmonary disease, unspecified: Secondary | ICD-10-CM

## 2014-12-06 NOTE — Assessment & Plan Note (Signed)
Intermittent dyspnea that he describes would be consistent with his history of paroxysmal atrial fibrillation, or silent ischemia, since he doesn't feel palpitation. He doesn't describe wheezing and it is not clear that the rescue inhaler makes very much difference. Plan-he will watch dyspnea pattern and meanwhile get his CPAP machine replaced. On return, we will decide whether full PFT would be helpful for him and we can consider therapeutic trial of medication options as appropriate.

## 2014-12-06 NOTE — Assessment & Plan Note (Signed)
Especially given his significant underlying cardiac disease, his CPAP remains medically necessary and he has been compliant with therapy. His machine is worn out and needs replacement. I'm not sure he can tell the difference between his current pressure of 8 or previous pressure of 9. I marked his DME form for pressure of 9.

## 2014-12-06 NOTE — Progress Notes (Signed)
09/13/11 -81 year old male former smoker followed for obstructive sleep apnea, COPD, complicated by GERD, CAD, history of lung nodule LOV- 09/13/2010 PCP Dr Ann Held. Has had flu vaccine and shingles vaccine this year. Getting hyperbaric treatment for circulatory ischemia/wound right foot. He continues CPAP [facility] using a nasal pillows mask. He is able to use this all night every night. We discussed his history of COPD-he quit smoking many years ago. He denies any wheeze, cough, or shortness of breath with his routine activities, recognizing that exertion is limited. I asked about his lung nodule. He had a CT scan at [facility]. We will request that report.  09/11/12-81 year old male former smoker followed for obstructive sleep apnea, COPD, complicated by GERD, CAD, history of lung nodule  Wife is here FOLLOWS FOR: wears CPAP 9/ [facility] every night for about 6-7 hours each night and pressure working well for patient. No snoring through his mask. He continues cardiac rehabilitation 3 times per week and denies shortness of breath wheeze or cough. He is being evaluated for chronic left sided headaches. Workup for sinusitis was negative.  09/10/13- 81 year old male former smoker followed for obstructive sleep apnea, COPD, complicated by GERD, CAD, history of lung nodule  Wife is here FOLLOWS FOR: Breathing has been doing well. Currently using CPAP 9/ [facility]  machine every night. Has been falling asleep more often while sitting. Bedtime 11 PM to midnight, up between 9 and 10 AM. Sleeps through the night. Hallucinates about people and plants at night no sleepwalking and no kicking described.  12/09/13- 81 year old male former smoker followed for obstructive sleep apnea, COPD, complicated by GERD, CAD, history of lung nodule  Wife is here FOLLOWS FOR: needs order for chin strap sent to [facility]-has CPAP report attached- autotitration suggests  pressure 6-7, but too much leak.. Using CPAP 9/ [facility] Chronic headaches.  12/06/14- 81 year old male former smoker followed for obstructive sleep apnea, COPD, complicated by GERD, CAD/ CABG/PAFib/ coumadin, history of lung nodule  Wife is here FOLLOWS FOR: Wears CPAP 8 every night for about 7-8 hours; has order to fill out for new CPAP machine. DME is [facility]. Current machine is worn out. Recent MI 11/12/2014 with new stent. He was at [facility] ER recently for dyspnea which comes and goes. He doesn't recognize palpitations with this and denies significant cough wheeze or phlegm. He has albuterol rescue inhaler used occasionally, and a steroid nasal spray. He wasn't told of any significant findings from chest x-ray at ER.  Office Spirometry 12/06/2014-weak and inconsistent effort. Severe obstructive and restrictive defects. FVC 2.09/56%, FEV1 1.24/44%, FEV1/FVC 0.60, FEF 25-75 percent 0.31. Reduced forced vital capacity may reflect weak effort.  ROS-see HPI Constitutional:   No-   weight loss, night sweats, fevers, chills, +fatigue, lassitude. HEENT:   + headaches, no- difficulty swallowing, tooth/dental problems, sore throat,       No-  sneezing, itching, ear ache, nasal congestion, post nasal drip,  CV:  No- acute  chest pain, orthopnea, PND, swelling in lower extremities, anasarca, dizziness, palpitations Resp: No- acute  shortness of breath with exertion or at rest.              No-   productive cough,  No non-productive cough,  No- coughing up of blood.              No-   change in color of mucus.  No- wheezing.   Skin: No-   rash or  lesions. GI:  No-   heartburn, indigestion, abdominal pain, nausea, vomiting,  GU:  MS:  Neuro-     nothing unusual Psych:  No- change in mood or affect. No depression or anxiety.  No memory loss.  OBJ General- Alert, Oriented, Affect-appropriate, Distress- none acute. Overweight Skin- rash-none, lesions- none, excoriation-  none Lymphadenopathy- none Head- atraumatic            Eyes- Gross vision intact, PERRLA, conjunctivae clear secretions            Ears- Hearing, canals-normal            Nose- Clear, no-Septal dev, mucus, polyps, erosion, perforation             Throat- Mallampati II-III , mucosa clear , drainage- none, tonsils- atrophic Neck- flexible , trachea midline, no stridor , thyroid nl, carotid no bruit Chest - symmetrical excursion , unlabored           Heart/CV- RRR at this visit , no murmur , no gallop  , no rub, nl s1 s2                           - JVD- none , edema- none, stasis changes- none, varices- none           Lung- +very diminished/ clear, unlabored, , wheeze- none, cough- none , dullness-none, rub- none           Chest wall-  Abd-  Br/ Gen/ Rectal- Not done, not indicated Extrem- cyanosis- none, clubbing, none, atrophy- none, strength- fair,+cane Neuro- grossly intact to observation

## 2014-12-06 NOTE — Patient Instructions (Addendum)
Order- office spirometry  Dx dyspnea on exertion  Order- DME [facility]   Replacement for worn out CPAP machine 9 cwp, mask of choice, humidifier, supplies   Dx OSA   - I have completed form and given to patient to return to [facility].   Please call as needed

## 2014-12-07 DIAGNOSIS — M1990 Unspecified osteoarthritis, unspecified site: Secondary | ICD-10-CM | POA: Diagnosis not present

## 2014-12-07 DIAGNOSIS — I214 Non-ST elevation (NSTEMI) myocardial infarction: Secondary | ICD-10-CM | POA: Diagnosis not present

## 2014-12-07 DIAGNOSIS — I509 Heart failure, unspecified: Secondary | ICD-10-CM | POA: Diagnosis not present

## 2014-12-07 DIAGNOSIS — I10 Essential (primary) hypertension: Secondary | ICD-10-CM | POA: Diagnosis not present

## 2014-12-07 DIAGNOSIS — F0281 Dementia in other diseases classified elsewhere with behavioral disturbance: Secondary | ICD-10-CM | POA: Diagnosis not present

## 2014-12-08 ENCOUNTER — Other Ambulatory Visit: Payer: Self-pay

## 2014-12-08 DIAGNOSIS — I10 Essential (primary) hypertension: Secondary | ICD-10-CM | POA: Diagnosis not present

## 2014-12-08 DIAGNOSIS — I214 Non-ST elevation (NSTEMI) myocardial infarction: Secondary | ICD-10-CM | POA: Diagnosis not present

## 2014-12-08 DIAGNOSIS — I509 Heart failure, unspecified: Secondary | ICD-10-CM | POA: Diagnosis not present

## 2014-12-08 DIAGNOSIS — M1990 Unspecified osteoarthritis, unspecified site: Secondary | ICD-10-CM | POA: Diagnosis not present

## 2014-12-08 DIAGNOSIS — F0281 Dementia in other diseases classified elsewhere with behavioral disturbance: Secondary | ICD-10-CM | POA: Diagnosis not present

## 2014-12-08 MED ORDER — ATORVASTATIN CALCIUM 40 MG PO TABS: 40.0000 mg | ORAL_TABLET | Freq: Every day | ORAL | Status: AC

## 2014-12-08 NOTE — Telephone Encounter (Signed)
Rx(s) sent to pharmacy electronically.  

## 2014-12-12 DIAGNOSIS — I214 Non-ST elevation (NSTEMI) myocardial infarction: Secondary | ICD-10-CM | POA: Diagnosis not present

## 2014-12-12 DIAGNOSIS — I10 Essential (primary) hypertension: Secondary | ICD-10-CM | POA: Diagnosis not present

## 2014-12-12 DIAGNOSIS — M1990 Unspecified osteoarthritis, unspecified site: Secondary | ICD-10-CM | POA: Diagnosis not present

## 2014-12-12 DIAGNOSIS — F0281 Dementia in other diseases classified elsewhere with behavioral disturbance: Secondary | ICD-10-CM | POA: Diagnosis not present

## 2014-12-12 DIAGNOSIS — I509 Heart failure, unspecified: Secondary | ICD-10-CM | POA: Diagnosis not present

## 2014-12-13 DIAGNOSIS — I509 Heart failure, unspecified: Secondary | ICD-10-CM | POA: Diagnosis not present

## 2014-12-13 DIAGNOSIS — I10 Essential (primary) hypertension: Secondary | ICD-10-CM | POA: Diagnosis not present

## 2014-12-13 DIAGNOSIS — I214 Non-ST elevation (NSTEMI) myocardial infarction: Secondary | ICD-10-CM | POA: Diagnosis not present

## 2014-12-13 DIAGNOSIS — F0281 Dementia in other diseases classified elsewhere with behavioral disturbance: Secondary | ICD-10-CM | POA: Diagnosis not present

## 2014-12-13 DIAGNOSIS — M1990 Unspecified osteoarthritis, unspecified site: Secondary | ICD-10-CM | POA: Diagnosis not present

## 2014-12-14 DIAGNOSIS — I10 Essential (primary) hypertension: Secondary | ICD-10-CM | POA: Diagnosis not present

## 2014-12-14 DIAGNOSIS — M1990 Unspecified osteoarthritis, unspecified site: Secondary | ICD-10-CM | POA: Diagnosis not present

## 2014-12-14 DIAGNOSIS — I214 Non-ST elevation (NSTEMI) myocardial infarction: Secondary | ICD-10-CM | POA: Diagnosis not present

## 2014-12-14 DIAGNOSIS — F0281 Dementia in other diseases classified elsewhere with behavioral disturbance: Secondary | ICD-10-CM | POA: Diagnosis not present

## 2014-12-14 DIAGNOSIS — I509 Heart failure, unspecified: Secondary | ICD-10-CM | POA: Diagnosis not present

## 2014-12-16 ENCOUNTER — Other Ambulatory Visit: Payer: Self-pay | Admitting: Cardiovascular Disease

## 2014-12-16 NOTE — Telephone Encounter (Signed)
E sent to pharmacy 

## 2014-12-26 DIAGNOSIS — F0781 Postconcussional syndrome: Secondary | ICD-10-CM | POA: Diagnosis not present

## 2014-12-26 DIAGNOSIS — I10 Essential (primary) hypertension: Secondary | ICD-10-CM | POA: Diagnosis not present

## 2015-01-04 DIAGNOSIS — N3941 Urge incontinence: Secondary | ICD-10-CM | POA: Diagnosis not present

## 2015-01-08 DIAGNOSIS — L570 Actinic keratosis: Secondary | ICD-10-CM | POA: Diagnosis not present

## 2015-01-08 DIAGNOSIS — I639 Cerebral infarction, unspecified: Secondary | ICD-10-CM | POA: Diagnosis not present

## 2015-01-08 DIAGNOSIS — I10 Essential (primary) hypertension: Secondary | ICD-10-CM | POA: Diagnosis not present

## 2015-01-08 DIAGNOSIS — L719 Rosacea, unspecified: Secondary | ICD-10-CM | POA: Diagnosis not present

## 2015-01-19 DIAGNOSIS — M6281 Muscle weakness (generalized): Secondary | ICD-10-CM | POA: Diagnosis not present

## 2015-01-19 DIAGNOSIS — F0781 Postconcussional syndrome: Secondary | ICD-10-CM | POA: Diagnosis not present

## 2015-01-23 DIAGNOSIS — F0781 Postconcussional syndrome: Secondary | ICD-10-CM | POA: Diagnosis not present

## 2015-01-23 DIAGNOSIS — Z79899 Other long term (current) drug therapy: Secondary | ICD-10-CM | POA: Diagnosis not present

## 2015-01-23 DIAGNOSIS — I10 Essential (primary) hypertension: Secondary | ICD-10-CM | POA: Diagnosis not present

## 2015-01-23 DIAGNOSIS — G301 Alzheimer's disease with late onset: Secondary | ICD-10-CM | POA: Diagnosis not present

## 2015-01-23 DIAGNOSIS — R7309 Other abnormal glucose: Secondary | ICD-10-CM | POA: Diagnosis not present

## 2015-01-24 ENCOUNTER — Other Ambulatory Visit: Payer: Self-pay

## 2015-01-24 NOTE — Patient Outreach (Signed)
[facility]) Care Management  01/24/2015  Gavin [name] 09-May-1929 [id number]  SUBJECTIVE:  Telephone call to patient regarding primary MD referral.  Discussed and offered [facility] services to patient.  Patient states he will need to discuss services with his wife.  Requested contact name and phone number.  RNCM gave contact name and number as requested.    PLAN:  RNCM will await return call from patient.  If no return call will attempt contact with patient within 1 week.   Quinn Plowman RN,BSN,CCM [place] Coordinator [phone]

## 2015-01-26 ENCOUNTER — Other Ambulatory Visit: Payer: Self-pay

## 2015-01-26 ENCOUNTER — Ambulatory Visit: Payer: Self-pay

## 2015-01-26 DIAGNOSIS — B351 Tinea unguium: Secondary | ICD-10-CM | POA: Diagnosis not present

## 2015-01-26 DIAGNOSIS — I10 Essential (primary) hypertension: Secondary | ICD-10-CM

## 2015-01-26 DIAGNOSIS — I70209 Unspecified atherosclerosis of native arteries of extremities, unspecified extremity: Secondary | ICD-10-CM | POA: Diagnosis not present
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
## 2015-01-27 ENCOUNTER — Ambulatory Visit: Payer: Medicare Other

## 2015-01-27 ENCOUNTER — Other Ambulatory Visit: Payer: Self-pay

## 2015-01-27 DIAGNOSIS — F0781 Postconcussional syndrome: Secondary | ICD-10-CM | POA: Diagnosis not present

## 2015-01-27 DIAGNOSIS — G8929 Other chronic pain: Secondary | ICD-10-CM | POA: Diagnosis not present

## 2015-01-27 DIAGNOSIS — G4489 Other headache syndrome: Secondary | ICD-10-CM | POA: Diagnosis not present

## 2015-01-27 DIAGNOSIS — M6281 Muscle weakness (generalized): Secondary | ICD-10-CM | POA: Diagnosis not present

## 2015-01-27 DIAGNOSIS — R2689 Other abnormalities of gait and mobility: Secondary | ICD-10-CM | POA: Diagnosis not present

## 2015-01-27 NOTE — Patient Outreach (Signed)
Gavin [name]) Care Management  01/27/2015  Gavin [name] 16-Mar-1929 [id number]   Telephone call to speak with patients wife as requested.  Unable to reach.  HIPAA compliant voice message left with call back phone number.   PLAN;  RNCM will attempt 2nd outreach to patients wife as requested by patient.    Quinn Plowman RN,BSN,CCM [place] Coordinator [phone]

## 2015-01-30 ENCOUNTER — Ambulatory Visit: Payer: Self-pay

## 2015-01-30 ENCOUNTER — Other Ambulatory Visit: Payer: Self-pay

## 2015-01-30 ENCOUNTER — Other Ambulatory Visit: Payer: Self-pay | Admitting: *Deleted

## 2015-01-30 DIAGNOSIS — I959 Hypotension, unspecified: Secondary | ICD-10-CM

## 2015-01-30 DIAGNOSIS — R296 Repeated falls: Secondary | ICD-10-CM

## 2015-01-30 NOTE — Patient Outreach (Addendum)
[facility]) Care Management  01/30/2015  Gavin [name] 09-10-1929 [id number]   SUBJECTIVE:  Telephone call to patient / wife as per requested by patient.  HIPAA verified. Discussed [facility] care management services. Wife verbalized understanding.   Wife states patient has had 11 falls within the past year.  Wife states patient has had dizziness historically but the dizziness has gotten much worse since March 2016.  Wife confirms primary MD is aware. Wife states patients primary MD has been adjusting patients medications.  Wife states patient becomes dizzy when standing and when turning his head.  Wife acknowledges that patient is seeing spiders and bushes.  Wife states patient becomes extremely angry with the spiders. Wife states she fills patients pill box weekly. Wife states patient is on at least 10 medications daily and is concerned about drug interactions.   ASSESSMENT: Community case management indicated. Patient will benefit from pharmacy assistance for medication reconciliation/education.  PLAN: Patient has been referred to community case manager.  RNCM will refer patient to  [facility] pharmacist for medication assistance.   Quinn Plowman RN,BSN,CCM [place] Coordinator [phone]

## 2015-01-30 NOTE — Patient Outreach (Signed)
[facility]) Care Management  01/30/2015  Gavin [name] September 19, 1929 [id number]   Initial telephone call to patient regarding MD referral, pt and his wife have agreed to [facility] services. [name] states that they have several appointments on their schedule, states he is going to outpatient  Rehab at [facility]  to help with his memory and to  exercise after his concussion. He agreed for me to call back on 5/13, will follow up at that time.  Joylene Draft, RN, [place] Care Management [phone]

## 2015-01-31 DIAGNOSIS — G4489 Other headache syndrome: Secondary | ICD-10-CM | POA: Diagnosis not present

## 2015-01-31 DIAGNOSIS — R2689 Other abnormalities of gait and mobility: Secondary | ICD-10-CM | POA: Diagnosis not present

## 2015-01-31 DIAGNOSIS — D649 Anemia, unspecified: Secondary | ICD-10-CM | POA: Diagnosis not present

## 2015-01-31 DIAGNOSIS — M6281 Muscle weakness (generalized): Secondary | ICD-10-CM | POA: Diagnosis not present

## 2015-01-31 DIAGNOSIS — G8929 Other chronic pain: Secondary | ICD-10-CM | POA: Diagnosis not present

## 2015-01-31 DIAGNOSIS — F0781 Postconcussional syndrome: Secondary | ICD-10-CM | POA: Diagnosis not present

## 2015-02-02 ENCOUNTER — Other Ambulatory Visit: Payer: Self-pay | Admitting: Pharmacist

## 2015-02-02 ENCOUNTER — Encounter: Payer: Self-pay | Admitting: Pharmacist

## 2015-02-02 DIAGNOSIS — F0781 Postconcussional syndrome: Secondary | ICD-10-CM | POA: Diagnosis not present

## 2015-02-02 DIAGNOSIS — M6281 Muscle weakness (generalized): Secondary | ICD-10-CM | POA: Diagnosis not present

## 2015-02-02 DIAGNOSIS — R2689 Other abnormalities of gait and mobility: Secondary | ICD-10-CM | POA: Diagnosis not present

## 2015-02-02 DIAGNOSIS — G4489 Other headache syndrome: Secondary | ICD-10-CM | POA: Diagnosis not present

## 2015-02-02 DIAGNOSIS — G8929 Other chronic pain: Secondary | ICD-10-CM | POA: Diagnosis not present

## 2015-02-02 NOTE — Patient Outreach (Signed)
[facility] [facility]) Care Management  [place]   02/02/2015  JATINDER MCDONAGH 10-Jan-1929 [id number]  Subjective: Gavin [name] is a 81 y.o. male who was referred to [facility] for medication reconciliation and to address wife's concerns over drug-drug interactions causing patient's falls and dizziness.  Objective:   Current Medications: Current Outpatient Prescriptions  Medication Sig Dispense Refill  . acetaminophen (TYLENOL) 325 MG tablet Take 2 tablets (650 mg total) by mouth every 4 (four) hours as needed.    Marland Kitchen albuterol (PROVENTIL HFA;VENTOLIN HFA) 108 (90 BASE) MCG/ACT inhaler Inhale into the lungs every 6 (six) hours as needed for wheezing or shortness of breath. ProAir    . aspirin EC 81 MG tablet Take 81 mg by mouth at bedtime.     Marland Kitchen atorvastatin (LIPITOR) 40 MG tablet Take 1 tablet (40 mg total) by mouth at bedtime. 31 tablet 11  . cetirizine (ZYRTEC) 10 MG tablet Take 10 mg by mouth daily.    . Cholecalciferol (VITAMIN D) 2000 UNITS CAPS Take 2,000 Units by mouth daily before supper.    . diclofenac sodium (VOLTAREN) 1 % GEL Apply 2 g topically daily as needed (pain).    . DULoxetine (CYMBALTA) 60 MG capsule Take 60 mg by mouth daily.  0  . furosemide (LASIX) 40 MG tablet Take 40 mg by mouth daily as needed for fluid or edema.   0  . isosorbide mononitrate (IMDUR) 30 MG 24 hr tablet Take 30 mg by mouth 2 (two) times daily.   0  . Ketotifen Fumarate (ALAWAY OP) Place 1 drop into both eyes 2 (two) times daily as needed (dry eyes).    . Memantine HCl-Donepezil HCl 14-10 MG CP24 Take 1 capsule by mouth daily.    . Menthol, Topical Analgesic, (BIOFREEZE EX) Apply 1 application topically daily as needed (pain).    . metoprolol (LOPRESSOR) 50 MG tablet 25-50 mg 2 (two) times daily. Take 1/2 tablet (25 mg) every morning and 1 tablet (50 mg) at night  0  . mirtazapine (REMERON) 30 MG tablet Take 30 mg by mouth daily before supper.   0  . Misc Natural  Products (LUTEIN VISION BLEND PO) Take 1 tablet by mouth 2 (two) times daily.    . mometasone (NASONEX) 50 MCG/ACT nasal spray Place 1 spray into both nostrils at bedtime.     . Multiple Vitamin (MULTIVITAMIN) tablet Take 1 tablet by mouth daily.    . Multiple Vitamins-Minerals (PRESERVISION AREDS PO) Take 1 tablet by mouth 2 (two) times daily.    . mupirocin ointment (BACTROBAN) 2 % Apply 1 application topically at bedtime. Apply to toe  0  . NASONEX 50 MCG/ACT nasal spray Place 2 sprays into the nose 2 (two) times daily.     . nitroGLYCERIN (NITROSTAT) 0.4 MG SL tablet Place 0.4 mg under the tongue every 5 (five) minutes as needed for chest pain.    . pantoprazole (PROTONIX) 40 MG tablet take 1 tablet by mouth once daily 30 tablet 6  . QUEtiapine (SEROQUEL) 50 MG tablet Take 50 mg by mouth daily before supper.   1  . RANEXA 500 MG 12 hr tablet take 1 tablet by mouth twice a day 60 tablet 6  . sertraline (ZOLOFT) 50 MG tablet Take 50 mg by mouth every morning.    . ticagrelor (BRILINTA) 90 MG TABS tablet Take 1 tablet (90 mg total) by mouth 2 (two) times daily. 60 tablet 6  . traMADol (  ULTRAM) 50 MG tablet Take 1 tablet by mouth as needed.    . vitamin B-12 (CYANOCOBALAMIN) 1000 MCG tablet Take 1,000 mcg by mouth daily before supper.     . zaleplon (SONATA) 10 MG capsule Take 10 mg by mouth at bedtime as needed for sleep.   0   No current facility-administered medications for this visit.    Functional Status: In your present state of health, do you have any difficulty performing the following activities: 01/26/2015 11/13/2014  Hearing? [place]? Y -  Difficulty concentrating or making decisions? Y -  Walking or climbing stairs? Y -  Dressing or bathing? N -  Doing errands, shopping? Tempie Donning    Fall/Depression Screening: No flowsheet data found.  Assessment:   Drugs sorted by system:  Neurologic/Psychologic: duloxetine, memantine-donepezil, mirtazapine, sertraline, quetiapine,  zaleplon  Cardiovascular: aspirin, atorvastatin, furosemide, isosorbide mononitrate, nitroglycerin SL, ticagrelor (Brilinta), ranolazine  Pulmonary/Allergy: albuterol, cetirizine, Nasonex  Gastrointestinal: pantoprazole  Endocrine: none  Urologic: none  Topical: menthol  Pain: acetaminophen, diclofenac, menthol (topical), tramadol  Miscellaneous: cholecalciferol, ketotifen eye drops, Lutein Vision Blend, multivitamin, mupirocin, vitamin G50   Duplications in therapy: none identified Gaps in therapy: none identified Medications to avoid in the elderly: zaleplon Drug interactions: patient with multiple Qtc prolongation drugs and multiple serotonin modulating drugs.    Plan: 1. Medication review: will send primary care provider my medication review results. Patient with recent EKGs from February 2016. Will recommend to continue to monitor EKGs for QTc prolongation. Patient also on many serotonin modulating medications that could contribute to dizziness. Zaleplon could also be contributing to dizziness.     Nicoletta Ba, PharmD, [facility] [place] [phone]

## 2015-02-02 NOTE — Patient Outreach (Signed)
[facility]) Care Management  [place]   02/02/2015  GRANVIL DJORDJEVIC May 16, 1929 [id number]  Subjective: Gavin [name] is a 81 y.o. male who was referred to [facility] for medication reconciliation and review, as well as to address the wife's concerns over drug-drug interactions.   Objective:   Current Medications: Current Outpatient Prescriptions  Medication Sig Dispense Refill  . acetaminophen (TYLENOL) 325 MG tablet Take 2 tablets (650 mg total) by mouth every 4 (four) hours as needed.    Marland Kitchen albuterol (PROVENTIL HFA;VENTOLIN HFA) 108 (90 BASE) MCG/ACT inhaler Inhale into the lungs every 6 (six) hours as needed for wheezing or shortness of breath. ProAir    . aspirin EC 81 MG tablet Take 81 mg by mouth at bedtime.     Marland Kitchen atorvastatin (LIPITOR) 40 MG tablet Take 1 tablet (40 mg total) by mouth at bedtime. 31 tablet 11  . cetirizine (ZYRTEC) 10 MG tablet Take 10 mg by mouth daily.    . Cholecalciferol (VITAMIN D) 2000 UNITS CAPS Take 2,000 Units by mouth daily before supper.    . diclofenac sodium (VOLTAREN) 1 % GEL Apply 2 g topically daily as needed (pain).    . DULoxetine (CYMBALTA) 60 MG capsule Take 60 mg by mouth daily.  0  . furosemide (LASIX) 40 MG tablet Take 40 mg by mouth daily as needed for fluid or edema.   0  . isosorbide mononitrate (IMDUR) 30 MG 24 hr tablet Take 30 mg by mouth 2 (two) times daily.   0  . Ketotifen Fumarate (ALAWAY OP) Place 1 drop into both eyes 2 (two) times daily as needed (dry eyes).    . Memantine HCl-Donepezil HCl 14-10 MG CP24 Take 1 capsule by mouth daily.    . Menthol, Topical Analgesic, (BIOFREEZE EX) Apply 1 application topically daily as needed (pain).    . metoprolol (LOPRESSOR) 50 MG tablet 25-50 mg 2 (two) times daily. Take 1/2 tablet (25 mg) every morning and 1 tablet (50 mg) at night  0  . mirtazapine (REMERON) 30 MG tablet Take 30 mg by mouth daily before supper.   0  . Misc  Natural Products (LUTEIN VISION BLEND PO) Take 1 tablet by mouth 2 (two) times daily.    . mometasone (NASONEX) 50 MCG/ACT nasal spray Place 1 spray into both nostrils at bedtime.     . Multiple Vitamin (MULTIVITAMIN) tablet Take 1 tablet by mouth daily.    . Multiple Vitamins-Minerals (PRESERVISION AREDS PO) Take 1 tablet by mouth 2 (two) times daily.    . mupirocin ointment (BACTROBAN) 2 % Apply 1 application topically at bedtime. Apply to toe  0  . NASONEX 50 MCG/ACT nasal spray Place 2 sprays into the nose 2 (two) times daily.     . nitroGLYCERIN (NITROSTAT) 0.4 MG SL tablet Place 0.4 mg under the tongue every 5 (five) minutes as needed for chest pain.    . pantoprazole (PROTONIX) 40 MG tablet take 1 tablet by mouth once daily 30 tablet 6  . QUEtiapine (SEROQUEL) 50 MG tablet Take 50 mg by mouth daily before supper.   1  . RANEXA 500 MG 12 hr tablet take 1 tablet by mouth twice a day 60 tablet 6  . sertraline (ZOLOFT) 50 MG tablet Take 50 mg by mouth every morning.    . ticagrelor (BRILINTA) 90 MG TABS tablet Take 1 tablet (90 mg total) by mouth 2 (two) times daily. 60 tablet 6  .  traMADol (ULTRAM) 50 MG tablet Take 1 tablet by mouth as needed.    . vitamin B-12 (CYANOCOBALAMIN) 1000 MCG tablet Take 1,000 mcg by mouth daily before supper.     . zaleplon (SONATA) 10 MG capsule Take 10 mg by mouth at bedtime as needed for sleep.   0   No current facility-administered medications for this visit.    Functional Status: In your present state of health, do you have any difficulty performing the following activities: 01/26/2015 11/13/2014  Hearing? [place]? Y -  Difficulty concentrating or making decisions? Y -  Walking or climbing stairs? Y -  Dressing or bathing? N -  Doing errands, shopping? Tempie Donning    Fall/Depression Screening: No flowsheet data found.  Assessment: 1. Medication review: Wife had concerns about drug-drug interactions causing the patient's dizziness and falls. Some of his  medications (see review from earlier today) could be contributing to that.   Plan: 1. Medication review: shared my findings with the patient's wife (his primary caretaker). She verbalized understanding and will follow up with Dr. Nicki Reaper, who I sent my recommendations/concerns to. No further pharmacy assistance needed, will close out of pharmacy program.   Nicoletta Ba, PharmD, [facility] Resident [phone]

## 2015-02-03 ENCOUNTER — Encounter: Payer: Self-pay | Admitting: *Deleted

## 2015-02-03 ENCOUNTER — Other Ambulatory Visit: Payer: Self-pay | Admitting: *Deleted

## 2015-02-03 DIAGNOSIS — R296 Repeated falls: Secondary | ICD-10-CM

## 2015-02-03 NOTE — Patient Outreach (Signed)
[facility]) Care Management  02/03/2015  Gavin [name] [birth year] [id number]   Subjective : " We are doing pretty good on today" Initial telephone call to patient and his wife Gavin [name] ( primary caregiver),they were both on speaker phone during assessment questions conversation.   Assessment and Plan: Patient attends outpatient rehab for physical, occupational and speech therapy at least 2 days per week, his wife is able to provide transportation for all appointments.They report that the last time that he had a fall was in January of 2016 continues to state that he has occasional dizziness and concern regarding falling.  Will plan initial Care management RN visit on Monday,May 23 at 1000.  Joylene Draft, RN, [facility] [phone]

## 2015-02-06 DIAGNOSIS — G8929 Other chronic pain: Secondary | ICD-10-CM | POA: Diagnosis not present

## 2015-02-06 DIAGNOSIS — F0781 Postconcussional syndrome: Secondary | ICD-10-CM | POA: Diagnosis not present

## 2015-02-06 DIAGNOSIS — M6281 Muscle weakness (generalized): Secondary | ICD-10-CM | POA: Diagnosis not present

## 2015-02-06 DIAGNOSIS — R2689 Other abnormalities of gait and mobility: Secondary | ICD-10-CM | POA: Diagnosis not present

## 2015-02-06 DIAGNOSIS — G4489 Other headache syndrome: Secondary | ICD-10-CM | POA: Diagnosis not present

## 2015-02-07 ENCOUNTER — Ambulatory Visit (INDEPENDENT_AMBULATORY_CARE_PROVIDER_SITE_OTHER): Payer: Medicare Other | Admitting: Cardiovascular Disease

## 2015-02-07 ENCOUNTER — Encounter: Payer: Self-pay | Admitting: Cardiovascular Disease

## 2015-02-07 VITALS — BP 158/76 | HR 72 | Ht 67.0 in | Wt 202.0 lb

## 2015-02-07 DIAGNOSIS — I480 Paroxysmal atrial fibrillation: Secondary | ICD-10-CM | POA: Diagnosis not present

## 2015-02-07 DIAGNOSIS — I214 Non-ST elevation (NSTEMI) myocardial infarction: Secondary | ICD-10-CM | POA: Diagnosis not present

## 2015-02-07 MED ORDER — CLOPIDOGREL BISULFATE 75 MG PO TABS: 75.0000 mg | ORAL_TABLET | Freq: Every day | ORAL | Status: AC

## 2015-02-07 MED ORDER — AMLODIPINE BESYLATE 2.5 MG PO TABS: 2.5000 mg | ORAL_TABLET | Freq: Every day | ORAL | Status: DC

## 2015-02-07 NOTE — Assessment & Plan Note (Signed)
History of CAD status post cardiac catheterization performed by myself 06/01/13 revealing moderate RCA disease, ostial nondominant circumflex disease, diagonal branch disease and distal LAD occlusion. At that time there is no obvious culprit lesion and medical therapy is recommended. He was recently admitted with a non-STEMI and underwent cardiac catheterization by Dr. Burt Knack for stenting of his mid dominant RCA with a drug-eluting stent. His EF was mildly depressed. He was begun on Brilenta twice a day. Since that time he's complained of increasing shortness of breath. I'm going to stop his Brilenta and transition him to Plavix.

## 2015-02-07 NOTE — Assessment & Plan Note (Signed)
History of hypertension blood pressure measured at 162/76. He is on metoprolol and has been on amlodipine in the past. I'm going to restart amlodipine 2.5 mg a day

## 2015-02-07 NOTE — Progress Notes (Signed)
02/07/2015 Gavin [name]   [birth year]  [id number]  Primary Physician Ann Held, MD Primary Cardiologist: [name] [name]   HPI:  36-old white married male with a history of  coronary disease with last cardiac catheterization July 2010 revealing an occluded LAD stent otherwise normal coronary arteries. At that time his EF was 30%, subsequent 2-D echo in 2013 revealed EF is normal. He had a stress test in 2012 with mod to severe perfusion defect, maybe slightly worse than previous study.At that time the pt was without symptoms so no further work up was done. He also has a history of PAF and is on Coumadin for anticoagulation. He has been maintaining sinus rhythm. He has peripheral vascular disease with stenting by Dr. [name] to both SFAs in 2004 with 0 vessel runoff on the right and one on the left via peroneal. he also has bilateral carotid disease left greater than right. Other problems include hypertension hyperlipidemia, and obstructive sleep apnea on CPAP. He has chronic headache with neg. head CT in June, and recently per our office stopped his IMDUR to see if Headache would resolve. It did not. He resumed the Imdur 05/29/13. He was admitted 05/29/13 with recurrent chest pain worrisome for unstable angina. His Coumadin was held. His Troponin was negative. He underwent coronary angiogram 06/01/13. This revealed moderate RCA, ostial CFX, and distal Dx1 disease with know distal LAD occlusion. There was no obvious culprit lesion and the plan is for medical Rx. Norvasc and Ranexa were added and his Imdur was decreased.he was seen by [name] in the office 06/08/13 for post consultation followup. . I last saw him in the office earlier this year. He has paroxysmal atrial fibrillation on Coumadin anticoagulation. He has fallen several times one involving head trauma.  Since I saw him in the office one year ago he was admitted back in February for right shoulder pain which  ended up being a non-STEMI. He was catheterized by Dr. Burt Knack revealing 80% mid dominant RCA stenosis which was stented using a drug-eluting stent. He is placed on Brilenta. Since that time he noticed increasing shortness of breath   Current Outpatient Prescriptions  Medication Sig Dispense Refill  . albuterol (PROVENTIL HFA;VENTOLIN HFA) 108 (90 BASE) MCG/ACT inhaler Inhale into the lungs every 6 (six) hours as needed for wheezing or shortness of breath. ProAir    . aspirin EC 81 MG tablet Take 81 mg by mouth at bedtime.     Marland Kitchen atorvastatin (LIPITOR) 40 MG tablet Take 1 tablet (40 mg total) by mouth at bedtime. 31 tablet 11  . cetirizine (ZYRTEC) 10 MG tablet Take 10 mg by mouth daily.    . Cholecalciferol (VITAMIN D) 2000 UNITS CAPS Take 2,000 Units by mouth daily before supper.    . diclofenac sodium (VOLTAREN) 1 % GEL Apply 2 g topically daily as needed (pain).    . DULoxetine (CYMBALTA) 60 MG capsule Take 60 mg by mouth daily.  0  . furosemide (LASIX) 40 MG tablet Take 40 mg by mouth daily as needed for fluid or edema.   0  . isosorbide mononitrate (IMDUR) 30 MG 24 hr tablet Take 30 mg by mouth 2 (two) times daily.   0  . Ketotifen Fumarate (ALAWAY OP) Place 1 drop into both eyes 2 (two) times daily as needed (dry eyes).    . Menthol, Topical Analgesic, (BIOFREEZE EX) Apply 1 application topically daily as needed (pain).    Marland Kitchen  metoprolol (LOPRESSOR) 50 MG tablet 25-50 mg 2 (two) times daily. Take 1/2 tablet (25 mg) every morning and 1 tablet (50 mg) at night  0  . mirtazapine (REMERON) 30 MG tablet Take 30 mg by mouth daily before supper.   0  . Misc Natural Products (LUTEIN VISION BLEND PO) Take 1 tablet by mouth 2 (two) times daily.    . mometasone (NASONEX) 50 MCG/ACT nasal spray Place 1 spray into both nostrils at bedtime.     . Multiple Vitamin (MULTIVITAMIN) tablet Take 1 tablet by mouth daily.    . Multiple Vitamins-Minerals (PRESERVISION AREDS PO) Take 1 tablet by mouth 2 (two) times  daily.    Marland Kitchen NAMZARIC 28-10 MG CP24 Take 1 capsule by mouth daily.  0  . NASONEX 50 MCG/ACT nasal spray Place 2 sprays into the nose 2 (two) times daily.     . nitroGLYCERIN (NITROSTAT) 0.4 MG SL tablet Place 0.4 mg under the tongue every 5 (five) minutes as needed for chest pain.    . pantoprazole (PROTONIX) 40 MG tablet take 1 tablet by mouth once daily 30 tablet 6  . QUEtiapine (SEROQUEL) 50 MG tablet Take 50 mg by mouth daily before supper.   1  . RANEXA 500 MG 12 hr tablet take 1 tablet by mouth twice a day 60 tablet 6  . sertraline (ZOLOFT) 50 MG tablet Take 50 mg by mouth every morning.    . vitamin B-12 (CYANOCOBALAMIN) 1000 MCG tablet Take 1,000 mcg by mouth daily before supper.     Marland Kitchen amLODipine (NORVASC) 2.5 MG tablet Take 1 tablet (2.5 mg total) by mouth daily. 30 tablet 11  . clopidogrel (PLAVIX) 75 MG tablet Take 1 tablet (75 mg total) by mouth daily. 30 tablet 11   No current facility-administered medications for this visit.    Allergies  Allergen Reactions  . Ciprofloxacin     unknown  . Ciprofloxacin Other (See Comments)    Unknown allergic reaction, possible shortness of breath  . Codeine     "knocked me out"  . Codeine Other (See Comments)    "knocked me out"  . Dexamethasone     unknown  . Dexamethasone Other (See Comments)    Unknown allergic reaction  . Sulfa Antibiotics Nausea And Vomiting  . Sulfonamide Derivatives     Unknown reaction  . Telmisartan     unknown  . Telmisartan Other (See Comments)    Micardis - heart races    History   Social History  . Marital Status: Married    Spouse Name: N/A  . Number of Children: N/A  . Years of Education: N/A   Occupational History  . 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 Smoking status: Former Smoker    Types: Cigarettes    Quit date: 09/23/1973  . Smokeless tobacco: Never Used     Comment: tried snuff and chew as a child (29yr) did not continue  . Alcohol Use: No      Comment: seldom-- every three or four years  . Drug Use: No  . Sexual Activity: No   Other Topics Concern  . Not on file   Social History Narrative   ** Merged History Encounter **         Review of Systems: General: negative for chills, fever, night sweats or weight changes.  Cardiovascular: negative for chest pain, dyspnea on exertion, edema, orthopnea, palpitations, paroxysmal nocturnal dyspnea or shortness of breath Dermatological: negative  for rash Respiratory: negative for cough or wheezing Urologic: negative for hematuria Abdominal: negative for nausea, vomiting, diarrhea, bright red blood per rectum, melena, or hematemesis Neurologic: negative for visual changes, syncope, or dizziness All other systems reviewed and are otherwise negative except as noted above.    Blood pressure 158/76, pulse 72, height 5\' 7"  (1.702 m), weight 202 lb (91.627 kg).  General appearance: alert and no distress Neck: no adenopathy, no JVD, supple, symmetrical, trachea midline, thyroid not enlarged, symmetric, no tenderness/mass/nodules and bbilateral carotid bruits left greater than right Lungs: clear to auscultation bilaterally Heart: regular rate and rhythm, S1, S2 normal, no murmur, click, rub or gallop Extremities: extremities normal, atraumatic, no cyanosis or edema  EKG not performed today  ASSESSMENT AND PLAN:   PAD (peripheral artery disease), history of stenting to both SFAs History of PAD status post bilateral SFA stenting with severe tibial disease. He has 0 vessel runoff on the right and 1 vessel on the left. He has no evidence of critical limb ischemia at this time. We follow him by duplex ultrasound most recently performed 04/20/14 revealing ABIs in the mid 0.8 range bilaterally with patent SFA stents.   NSTEMI (non-ST elevated myocardial infarction) History of CAD status post cardiac catheterization performed by myself 06/01/13 revealing moderate RCA disease, ostial  nondominant circumflex disease, diagonal branch disease and distal LAD occlusion. At that time there is no obvious culprit lesion and medical therapy is recommended. He was recently admitted with a non-STEMI and underwent cardiac catheterization by Dr. Burt Knack for stenting of his mid dominant RCA with a drug-eluting stent. His EF was mildly depressed. He was begun on Brilenta twice a day. Since that time he's complained of increasing shortness of breath. I'm going to stop his Brilenta and transition him to Plavix.   Hypertension History of hypertension blood pressure measured at 162/76. He is on metoprolol and has been on amlodipine in the past. I'm going to restart amlodipine 2.5 mg a day   Hyperlipidemia History of hyperlipidemia on atorvastatin 40 mg a day with recent lipid profile performed 11/14/14 notable for total cholesterol 154, LDL 95 and HDL 43   Carotid artery disease History of bilateral moderate ICA stenosis by digital ultrasound in July of last year. He does have bilateral carotid bruits left greater than right. I'm going to repeat carotid Doppler studies   PAF (paroxysmal atrial fibrillation) History of PAF maintaining sinus rhythm. He was on Coumadin and sequential fashion the past which was discontinued because of fall risk       [name] [facility], [facility] 02/07/2015 2:21 PM

## 2015-02-07 NOTE — Assessment & Plan Note (Signed)
History of PAD status post bilateral SFA stenting with severe tibial disease. He has 0 vessel runoff on the right and 1 vessel on the left. He has no evidence of critical limb ischemia at this time. We follow him by duplex ultrasound most recently performed 04/20/14 revealing ABIs in the mid 0.8 range bilaterally with patent SFA stents.

## 2015-02-07 NOTE — Assessment & Plan Note (Signed)
History of PAF maintaining sinus rhythm. He was on Coumadin and sequential fashion the past which was discontinued because of fall risk

## 2015-02-07 NOTE — Assessment & Plan Note (Signed)
History of hyperlipidemia on atorvastatin 40 mg a day with recent lipid profile performed 11/14/14 notable for total cholesterol 154, LDL 95 and HDL 43

## 2015-02-07 NOTE — Assessment & Plan Note (Signed)
History of bilateral moderate ICA stenosis by digital ultrasound in July of last year. He does have bilateral carotid bruits left greater than right. I'm going to repeat carotid Doppler studies

## 2015-02-07 NOTE — Patient Instructions (Signed)
  We will see you back in follow up in 3 months with Dr [name].   Dr [name] has ordered: 1. Stop Brilinta  2. Start Plavix 75mg  daily  3. Start Amlodipine 2.5mg  daily

## 2015-02-08 ENCOUNTER — Other Ambulatory Visit: Payer: Self-pay | Admitting: Internal Medicine

## 2015-02-08 DIAGNOSIS — G4733 Obstructive sleep apnea (adult) (pediatric): Secondary | ICD-10-CM

## 2015-02-09 ENCOUNTER — Encounter: Payer: Self-pay | Admitting: Internal Medicine

## 2015-02-09 ENCOUNTER — Ambulatory Visit (INDEPENDENT_AMBULATORY_CARE_PROVIDER_SITE_OTHER)
Admission: RE | Admit: 2015-02-09 | Discharge: 2015-02-09 | Disposition: A | Discharge status: Home / Self Care | Payer: Medicare Other | Source: Ambulatory Visit | Attending: Internal Medicine | Admitting: Internal Medicine

## 2015-02-09 ENCOUNTER — Ambulatory Visit (INDEPENDENT_AMBULATORY_CARE_PROVIDER_SITE_OTHER): Payer: Medicare Other | Admitting: Internal Medicine

## 2015-02-09 DIAGNOSIS — I252 Old myocardial infarction: Secondary | ICD-10-CM | POA: Diagnosis not present

## 2015-02-09 DIAGNOSIS — R0600 Dyspnea, unspecified: Secondary | ICD-10-CM

## 2015-02-09 DIAGNOSIS — G4733 Obstructive sleep apnea (adult) (pediatric): Secondary | ICD-10-CM | POA: Diagnosis not present

## 2015-02-09 DIAGNOSIS — Z87891 Personal history of nicotine dependence: Secondary | ICD-10-CM | POA: Diagnosis not present

## 2015-02-09 DIAGNOSIS — I10 Essential (primary) hypertension: Secondary | ICD-10-CM | POA: Diagnosis not present

## 2015-02-09 NOTE — Progress Notes (Signed)
09/13/11 -81 year old male former smoker followed for obstructive sleep apnea, COPD, complicated by GERD, CAD, history of lung nodule LOV- 09/13/2010 PCP Dr Ann Held. Has had flu vaccine and shingles vaccine this year. Getting hyperbaric treatment for circulatory ischemia/wound right foot. He continues CPAP [place] [facility] using a nasal pillows mask. He is able to use this all night every night. We discussed his history of COPD-he quit smoking many years ago. He denies any wheeze, cough, or shortness of breath with his routine activities, recognizing that exertion is limited. I asked about his lung nodule. He had a CT scan at [facility]. We will request that report.  09/11/12-81 year old male former smoker followed for obstructive sleep apnea, COPD, complicated by GERD, CAD, history of lung nodule  Wife is here FOLLOWS FOR: wears CPAP 9/ [facility] every night for about 6-7 hours each night and pressure working well for patient. No snoring through his mask. He continues cardiac rehabilitation 3 times per week and denies shortness of breath wheeze or cough. He is being evaluated for chronic left sided headaches. Workup for sinusitis was negative.  09/10/13- 80 year old male former smoker followed for obstructive sleep apnea, COPD, complicated by GERD, CAD, history of lung nodule  Wife is here FOLLOWS FOR: Breathing has been doing well. Currently using CPAP 9/ [place] [facility]  machine every night. Has been falling asleep more often while sitting. Bedtime 11 PM to midnight, up between 9 and 10 AM. Sleeps through the night. Hallucinates about people and plants at night no sleepwalking and no kicking described.  12/09/13- 81 year old male former smoker followed for obstructive sleep apnea, COPD, complicated by GERD, CAD, history of lung nodule  Wife is here FOLLOWS FOR: needs order for chin strap sent to [facility]-has CPAP report attached- autotitration suggests  pressure 6-7, but too much leak.. Using CPAP 9/ [facility] Chronic headaches.  12/06/14- 82 year old male former smoker followed for obstructive sleep apnea, COPD, complicated by GERD, CAD/ CABG/PAFib/ coumadin, history of lung nodule  Wife is here FOLLOWS FOR: Wears CPAP 8 every night for about 7-8 hours; has order to fill out for new CPAP machine. DME is [facility]. Current machine is worn out. Recent MI 11/12/2014 with new stent. He was at [facility] ER recently for dyspnea which comes and goes. He doesn't recognize palpitations with this and denies significant cough wheeze or phlegm. He has albuterol rescue inhaler used occasionally, and a steroid nasal spray. He wasn't told of any significant findings from chest x-ray at ER.  Office Spirometry 12/06/2014-weak and inconsistent effort. Severe obstructive and restrictive defects. FVC 2.09/56%, FEV1 1.24/44%, FEV1/FVC 0.60, FEF 25-75 percent 0.31. Reduced forced vital capacity may reflect weak effort.  02/09/15- 81 year old male former smoker followed for obstructive sleep apnea, COPD, complicated by GERD, CAD/ CABG/PAFib/ coumadin, history of lung nodule  Wife is here Product manager for: WEARING CPAP 8/ [facility] EVERY NIGHT, NO PROBLEMS WITH MASK OR TUBING. RECIEVED A NEW MACHINE A FEW WEEKS AGO.PT.  HAVING RANDOM DEEP BREATHING , PT. FEELS LIKE HE HAS TO STOP AND CATCH HIS BREATH He thinks CPAP set at 9, is going too high- leaks Our record said on 8.  Occasionally feel pressure discomfort left lateral pectoral area- not pain, not exertional, no sweat or palpitation.  ROS-see HPI Constitutional:   No-   weight loss, night sweats, fevers, chills, +fatigue, lassitude. HEENT:   + headaches, no- difficulty swallowing, tooth/dental problems, sore throat,  No-  sneezing, itching, ear ache, nasal congestion, post nasal drip,  CV:  No- acute  chest pain, orthopnea, PND, swelling in lower extremities, anasarca, dizziness,  palpitations Resp: No- acute  shortness of breath with exertion or at rest.              No-   productive cough,  No non-productive cough,  No- coughing up of blood.              No-   change in color of mucus.  No- wheezing.   Skin: No-   rash or lesions. GI:  No-   heartburn, indigestion, abdominal pain, nausea, vomiting,  GU:  MS:  Neuro-     nothing unusual Psych:  No- change in mood or affect. No depression or anxiety.  No memory loss.  OBJ General- Alert, Oriented, Affect-appropriate, Distress- none acute. Overweight Skin- rash-none, lesions- none, excoriation- none Lymphadenopathy- none Head- atraumatic            Eyes- Gross vision intact, PERRLA, conjunctivae clear secretions            Ears- Hearing, canals-normal            Nose- Clear, no-Septal dev, mucus, polyps, erosion, perforation             Throat- Mallampati II-III , mucosa clear , drainage- none, tonsils- atrophic Neck- flexible , trachea midline, no stridor , thyroid nl, carotid no bruit Chest - symmetrical excursion , unlabored           Heart/CV- RRR at this visit , no murmur , no gallop  , no rub, nl s1 s2                           - JVD- none , edema- none, stasis changes- none, varices- none           Lung- +very diminished/ clear, unlabored,  wheeze- none, cough- none , dullness-none, rub- none           Chest wall-  Abd-  Br/ Gen/ Rectal- Not done, not indicated Extrem- cyanosis- none, clubbing, none, atrophy- none, strength- fair,+cane Neuro- grossly intact to observation

## 2015-02-09 NOTE — Patient Instructions (Signed)
Order- CXR dx dyspnea   Order- DME [facility] reduce CPAP to 7 then get download for pressure compliance   Dx OSA  Please call as needed

## 2015-02-10 DIAGNOSIS — F0781 Postconcussional syndrome: Secondary | ICD-10-CM | POA: Diagnosis not present

## 2015-02-10 DIAGNOSIS — G4489 Other headache syndrome: Secondary | ICD-10-CM | POA: Diagnosis not present

## 2015-02-10 DIAGNOSIS — G8929 Other chronic pain: Secondary | ICD-10-CM | POA: Diagnosis not present

## 2015-02-10 DIAGNOSIS — R2689 Other abnormalities of gait and mobility: Secondary | ICD-10-CM | POA: Diagnosis not present

## 2015-02-10 DIAGNOSIS — M6281 Muscle weakness (generalized): Secondary | ICD-10-CM | POA: Diagnosis not present

## 2015-02-13 ENCOUNTER — Encounter: Payer: Self-pay | Admitting: *Deleted

## 2015-02-13 ENCOUNTER — Other Ambulatory Visit: Payer: Self-pay | Admitting: *Deleted

## 2015-02-13 VITALS — BP 160/80 | HR 68 | Resp 18

## 2015-02-13 DIAGNOSIS — F0391 Unspecified dementia with behavioral disturbance: Secondary | ICD-10-CM

## 2015-02-13 DIAGNOSIS — F0781 Postconcussional syndrome: Secondary | ICD-10-CM | POA: Diagnosis not present

## 2015-02-13 DIAGNOSIS — G4489 Other headache syndrome: Secondary | ICD-10-CM | POA: Diagnosis not present

## 2015-02-13 DIAGNOSIS — R2689 Other abnormalities of gait and mobility: Secondary | ICD-10-CM | POA: Diagnosis not present

## 2015-02-13 DIAGNOSIS — M6281 Muscle weakness (generalized): Secondary | ICD-10-CM | POA: Diagnosis not present

## 2015-02-13 DIAGNOSIS — F03918 Unspecified dementia, unspecified severity, with other behavioral disturbance: Secondary | ICD-10-CM

## 2015-02-13 DIAGNOSIS — G8929 Other chronic pain: Secondary | ICD-10-CM | POA: Diagnosis not present

## 2015-02-13 DIAGNOSIS — I10 Essential (primary) hypertension: Secondary | ICD-10-CM

## 2015-02-13 NOTE — Patient Outreach (Addendum)
[place] [facility] [facility] Care Management  02/13/2015  Gavin [name] [birth year] [id number]   Referral from Landis Martins, RN for Pharmacy, assigned Deanne Coffer, PharmD.  [name] [place], [place] Management [place] Assistant Phone: [phone] Fax: [phone]

## 2015-02-13 NOTE — Patient Outreach (Signed)
[facility] [facility]) Care Management   02/13/2015  Gavin [name] 10-Oct-1928 [id number]  Gavin [name] is an 81 y.o. male  Subjective: "I'm great so far this morning."  Objective:   Review of Systems  Constitutional: Negative.   HENT: Negative.   Eyes: Negative.   Respiratory: Negative.   Cardiovascular: Negative.   Gastrointestinal: Negative.   Musculoskeletal:       Complaint of arthritis discomfort at times right shoulder,back  Skin: Negative.   Neurological: Negative.   Psychiatric/Behavioral: Negative.     Physical Exam  Constitutional: He is oriented to person, place, and time. He appears well-developed.  Cardiovascular: Normal rate and regular rhythm.   Respiratory: Effort normal and breath sounds normal.  GI: Soft.  Musculoskeletal:  Arthritis discomfort at times right shoulder area  Neurological: He is alert and oriented to person, place, and time.  Skin: Skin is warm and dry.  Psychiatric: He has a normal mood and affect.   BP 160/80 mmHg  Pulse 68  Resp 18  SpO2 96%  Current Medications:   Current Outpatient Prescriptions  Medication Sig Dispense Refill  . albuterol (PROVENTIL HFA;VENTOLIN HFA) 108 (90 BASE) MCG/ACT inhaler Inhale into the lungs every 6 (six) hours as needed for wheezing or shortness of breath. ProAir    . amLODipine (NORVASC) 2.5 MG tablet Take 1 tablet (2.5 mg total) by mouth daily. 30 tablet 11  . aspirin EC 81 MG tablet Take 81 mg by mouth at bedtime.     Marland Kitchen atorvastatin (LIPITOR) 40 MG tablet Take 1 tablet (40 mg total) by mouth at bedtime. 31 tablet 11  . cetirizine (ZYRTEC) 10 MG tablet Take 10 mg by mouth daily.    . Cholecalciferol (VITAMIN D) 2000 UNITS CAPS Take 2,000 Units by mouth daily before supper.    . clopidogrel (PLAVIX) 75 MG tablet Take 1 tablet (75 mg total) by mouth daily. 30 tablet 11  . diclofenac sodium (VOLTAREN) 1 % GEL Apply 2 g topically daily as needed (pain).    . DULoxetine (CYMBALTA) 60 MG  capsule Take 60 mg by mouth daily.  0  . furosemide (LASIX) 40 MG tablet Take 40 mg by mouth daily as needed for fluid or edema.   0  . isosorbide mononitrate (IMDUR) 30 MG 24 hr tablet Take 30 mg by mouth 2 (two) times daily.   0  . Ketotifen Fumarate (ALAWAY OP) Place 1 drop into both eyes 2 (two) times daily as needed (dry eyes).    . Menthol, Topical Analgesic, (BIOFREEZE EX) Apply 1 application topically daily as needed (pain).    . metoprolol (LOPRESSOR) 50 MG tablet 25-50 mg 2 (two) times daily. Take 1/2 tablet (25 mg) every morning and 1 tablet (50 mg) at night  0  . mirtazapine (REMERON) 30 MG tablet Take 30 mg by mouth daily before supper.   0  . Misc Natural Products (LUTEIN VISION BLEND PO) Take 1 tablet by mouth 2 (two) times daily.    . Multiple Vitamin (MULTIVITAMIN) tablet Take 1 tablet by mouth daily.    . Multiple Vitamins-Minerals (PRESERVISION AREDS PO) Take 1 tablet by mouth 2 (two) times daily.    Marland Kitchen NAMZARIC 28-10 MG CP24 Take 1 capsule by mouth daily.  0  . NASONEX 50 MCG/ACT nasal spray Place 2 sprays into the nose 2 (two) times daily.     . nitroGLYCERIN (NITROSTAT) 0.4 MG SL tablet Place 0.4 mg under the tongue every 5 (five) minutes as  needed for chest pain.    . pantoprazole (PROTONIX) 40 MG tablet take 1 tablet by mouth once daily 30 tablet 6  . QUEtiapine (SEROQUEL) 50 MG tablet Take 50 mg by mouth daily before supper.   1  . RANEXA 500 MG 12 hr tablet take 1 tablet by mouth twice a day 60 tablet 6  . vitamin B-12 (CYANOCOBALAMIN) 1000 MCG tablet Take 1,000 mcg by mouth daily before supper.     . mometasone (NASONEX) 50 MCG/ACT nasal spray Place 1 spray into both nostrils at bedtime.     . sertraline (ZOLOFT) 50 MG tablet Take 50 mg by mouth every morning.     No current facility-administered medications for this visit.    Functional Status:   In your present state of health, do you have any difficulty performing the following activities: 02/13/2015 02/03/2015   Hearing? - Y  Vision? - Y  Difficulty concentrating or making decisions? - Y  Walking or climbing stairs? (No Data) Y  Dressing or bathing? - N  Doing errands, shopping? Y -  Conservation officer, nature and eating ? - N  Using the Toilet? - N  In the past six months, have you accidently leaked urine? - Y  Do you have problems with loss of bowel control? - N  Managing your Medications? - N  Managing your Finances? - N  Housekeeping or managing your Housekeeping? - N    Fall/Depression Screening:    PHQ 2/9 Scores 02/03/2015  PHQ - 2 Score 0    Assessment:  Initial home visit, greeted at the door by Gavin [name] and his wife [name] Patient noted using his Rolator. [facility] management services explained, pt and his wife agreed to services and Gavin [name] signed the consent.  Gavin [name] reported that his last fall was in January, 2016, he  is currently still attending outpatient Rehab services including physical , speech  and occupational therapy at  [facility] and his wife states these services should end in about 2 weeks, reports that the patient has gained much strength, but still needs reminder to use his walker at all times.  Gavin [name] bathroom is equipped with durable medical equipment, grab bars, shower chair, and rails at commode. During the visit reminded patient to always use his walker.  Gavin [name] assist the patient with daily medication, using a weekly pill box. She did voice concern regarding the patient being sleepy a lot during the day and questions if this is related to some medication that he may be taking during the morning and wonders if it can be given at a different time of day, such as at night to decrease his sleepiness during the day, patient states " I just drop off the sleep so easily during the day. Patient sleeps with his newly replaced CPAP at night. Gavin [name] reports that he sits in his recliner chair most of the day.   Patient blood pressure elevated during visit at 160/80, pt  and wife concerned and states  that this a little higher than normal, he has received his morning medications.Will alert Dr. Bary Leriche office of today's blood pressure reading and ask for  parameters when to call . Mrs. Nicki Reaper has a blood pressure machine and will began to check blood pressure at least 4 days per week. Reviewed [facility] calendar and area to record daily blood pressure readings.  Telephone call to Dr. Bary Leriche office regarding blood pressure reading today and parameters to call for, received phone call  back from [place] she stated per Dr. Nicki Reaper target blood pressure to call MD with is 170/90, will inform patient and his wife of this within the next 24 hours.  Plan:  Patient and wife provided with RNCM contact info as well as 24 nurse line number           Will  consult pharmacy for medication review.           Contact PCP for Blood Parameters, and notify patient and his wife.            Will fax today's visit note as well as involvement letter to Dr. Bary Leriche office.           Schedule RNCM telephone visit on 6/6 at 12:00 to follow up on Blood pressure readings  and Activity                     Progression.  [facility] CM Care Plan Problem One        Patient Outreach from 02/13/2015 in [place] Problem One  [place] for Problem One  Active   [facility] Long Term Goal (31-90 days)  Patient will not experience a fall within the next 31 days   [facility] Long Term Goal Start Date  02/13/15   Interventions for Problem One Long Term Goal  Discussed fall prevention strategies, and reviewed fall safety handout   [facility] CM Short Term Goal #1 (0-30 days)  Patient will use his walker at al times when ambulating for the next 30 days   [facility] CM Short Term Goal #1 Start Date  02/13/15   Interventions for Short Term Goal #1  Reviewed importance of using walker for stability   [facility] CM Short Term Goal #2 (0-30 days)  Patient will walk for al least 10 minutes a day, 3 days a week, within the  next 30 days    [facility] CM Short Term Goal #2 Start Date  02/13/15   Interventions for Short Term Goal #2  Discussed importance of low key exercise to strengthen muscles and improve balance, suggested walking as he does on their shopping trips to [employer], walking in home five minutes in morning  & 5 minutes in pm.esp on days that he does not have therapy scheduled .     [facility] CM Care Plan Problem Two        Patient Outreach from 02/13/2015 in [place] Problem Two  Self Care Management of Hypertension   Care Plan for Problem Two  Active   Interventions for Problem Two Long Term Goal   Discussed importance of knowing blood  pressure readings and controlling  high BP   [facility] Long Term Goal (31-90) days  Patient and wife will verbalize understanding Hypertension self care monitoring importance   [facility] Long Term Goal Start Date  02/13/15   [facility] CM Short Term Goal #1 (0-30 days)  Patient will have his blood  pressure checked and recorded by his wife at least 3 days per week within the next 30 days    [facility] CM Short Term Goal #1 Start Date  02/13/15   Interventions for Short Term Goal #2   Discussed importance of blood pressure monitoring, related to heart disase and taking medication as ordered by MD, notify Md of blood pressure reading per set parameter greater than 170/90.   [facility] CM Short Term Goal #2 (0-30 days)  Patient will report weighing self daily and recording within the next 30 days.   [facility] CM Short Term Goal #2 Start Date  02/13/15   Interventions for Short Term Goal #2  Discussed importance of maintaining a healthy weight  with hypertension, and monitoring salt and fat intake, and notifying MD of weight gain greater than 5 pounds in one week, or sob.     Joylene Draft, RN, [place] Care Management [phone]

## 2015-02-15 ENCOUNTER — Other Ambulatory Visit: Payer: Self-pay | Admitting: *Deleted

## 2015-02-15 NOTE — Patient Outreach (Signed)
[place] [facility]) Care Management  02/15/2015  PAOLO OKANE 03-27-1929 [id number]    Spoke with Dr. Bary Leriche office on  5/23 to find out target blood pressure   to call MD for, per MD office target blood pressure to call for  170/90.  Telephone call to patient, spoke with his wife Deep Bonawitz to give her target blood pressure, encouraged her to continue to check blood pressure per agreed goals and to notify MD for increased blood pressure and concerns.  Joylene Draft, RN, [place] Care Management [phone]

## 2015-02-16 ENCOUNTER — Other Ambulatory Visit: Payer: Self-pay | Admitting: Pharmacist

## 2015-02-16 NOTE — Patient Outreach (Signed)
[facility]) Care Management  [place]   02/16/2015  Gavin [name] September 28, 1928 [id number]  Subjective: Gavin [name] is a 81 y.o. male who was referred to pharmacy for medication management. Patient is known to pharmacy and assisted with medication management and reconciliation. Patient's wife is concerned that his medications are making him sleepy during the day and would like to know if she can change the administration of them to make him less sleepy.  Objective:   Current Medications: Current Outpatient Prescriptions  Medication Sig Dispense Refill  . albuterol (PROVENTIL HFA;VENTOLIN HFA) 108 (90 BASE) MCG/ACT inhaler Inhale into the lungs every 6 (six) hours as needed for wheezing or shortness of breath. ProAir    . amLODipine (NORVASC) 2.5 MG tablet Take 1 tablet (2.5 mg total) by mouth daily. 30 tablet 11  . aspirin EC 81 MG tablet Take 81 mg by mouth at bedtime.     Marland Kitchen atorvastatin (LIPITOR) 40 MG tablet Take 1 tablet (40 mg total) by mouth at bedtime. 31 tablet 11  . cetirizine (ZYRTEC) 10 MG tablet Take 10 mg by mouth daily.    . Cholecalciferol (VITAMIN D) 2000 UNITS CAPS Take 2,000 Units by mouth daily before supper.    . clopidogrel (PLAVIX) 75 MG tablet Take 1 tablet (75 mg total) by mouth daily. 30 tablet 11  . diclofenac sodium (VOLTAREN) 1 % GEL Apply 2 g topically daily as needed (pain).    . DULoxetine (CYMBALTA) 60 MG capsule Take 60 mg by mouth daily.  0  . furosemide (LASIX) 40 MG tablet Take 40 mg by mouth daily as needed for fluid or edema.   0  . isosorbide mononitrate (IMDUR) 30 MG 24 hr tablet Take 30 mg by mouth 2 (two) times daily.   0  . Ketotifen Fumarate (ALAWAY OP) Place 1 drop into both eyes 2 (two) times daily as needed (dry eyes).    . Menthol, Topical Analgesic, (BIOFREEZE EX) Apply 1 application topically daily as needed (pain).    . metoprolol (LOPRESSOR) 50 MG tablet 25-50 mg 2 (two) times daily. Take 1/2 tablet (25 mg)  every morning and 1 tablet (50 mg) at night  0  . mirtazapine (REMERON) 30 MG tablet Take 30 mg by mouth daily before supper.   0  . Misc Natural Products (LUTEIN VISION BLEND PO) Take 1 tablet by mouth 2 (two) times daily.    . mometasone (NASONEX) 50 MCG/ACT nasal spray Place 1 spray into both nostrils at bedtime.     . Multiple Vitamin (MULTIVITAMIN) tablet Take 1 tablet by mouth daily.    . Multiple Vitamins-Minerals (PRESERVISION AREDS PO) Take 1 tablet by mouth 2 (two) times daily.    Marland Kitchen NAMZARIC 28-10 MG CP24 Take 1 capsule by mouth daily.  0  . NASONEX 50 MCG/ACT nasal spray Place 2 sprays into the nose 2 (two) times daily.     . nitroGLYCERIN (NITROSTAT) 0.4 MG SL tablet Place 0.4 mg under the tongue every 5 (five) minutes as needed for chest pain.    . pantoprazole (PROTONIX) 40 MG tablet take 1 tablet by mouth once daily 30 tablet 6  . QUEtiapine (SEROQUEL) 50 MG tablet Take 50 mg by mouth daily before supper.   1  . RANEXA 500 MG 12 hr tablet take 1 tablet by mouth twice a day 60 tablet 6  . sertraline (ZOLOFT) 50 MG tablet Take 50 mg by mouth every morning.    . vitamin  B-12 (CYANOCOBALAMIN) 1000 MCG tablet Take 1,000 mcg by mouth daily before supper.      No current facility-administered medications for this visit.    Functional Status: In your present state of health, do you have any difficulty performing the following activities: 02/13/2015 02/03/2015  Hearing? - Y  Vision? - Y  Difficulty concentrating or making decisions? - Y  Walking or climbing stairs? (No Data) Y  Dressing or bathing? - N  Doing errands, shopping? Y -  Conservation officer, nature and eating ? - N  Using the Toilet? - N  In the past six months, have you accidently leaked urine? - Y  Do you have problems with loss of bowel control? - N  Managing your Medications? - N  Managing your Finances? - N  Housekeeping or managing your Housekeeping? - N    Fall/Depression Screening: PHQ 2/9 Scores 02/03/2015  PHQ - 2  Score 0    Assessment: 1. Medication management: I reviewed the patient's medication list and the following can make the patient drowsy: cetirizine, duloxetine, mirtazapine, donepezil (though more likely to cause insomnia), memantine, quetiapine, and sertraline (though also more likely to cause insomnia). The patient is currently taking mirtazapine and quetiapine at supper and therefore are unlikely causing daytime drowsiness. Donepezil/memantine should be administered at night but I am not sure per Epic when the patient is taking it.   Plan: 1. Medication management: I would recommend taking cetirizine in the evening. Patient could try taking sertraline and duloxetine in the evening to see if it helps with the daytime drowsiness. I called the patient to discuss this and I had to leave a HIPPA compliant message for patient to return my phone call.  I will reach out tomorrow (02/17/15) if the patient does not return my call today.   Nicoletta Ba, PharmD, [facility] Resident [phone]

## 2015-02-17 DIAGNOSIS — G4489 Other headache syndrome: Secondary | ICD-10-CM | POA: Diagnosis not present

## 2015-02-17 DIAGNOSIS — M6281 Muscle weakness (generalized): Secondary | ICD-10-CM | POA: Diagnosis not present

## 2015-02-17 DIAGNOSIS — R2689 Other abnormalities of gait and mobility: Secondary | ICD-10-CM | POA: Diagnosis not present

## 2015-02-17 DIAGNOSIS — F0781 Postconcussional syndrome: Secondary | ICD-10-CM | POA: Diagnosis not present

## 2015-02-17 DIAGNOSIS — G8929 Other chronic pain: Secondary | ICD-10-CM | POA: Diagnosis not present

## 2015-02-20 DIAGNOSIS — M6281 Muscle weakness (generalized): Secondary | ICD-10-CM | POA: Diagnosis not present

## 2015-02-20 DIAGNOSIS — F0781 Postconcussional syndrome: Secondary | ICD-10-CM | POA: Diagnosis not present

## 2015-02-20 DIAGNOSIS — R2689 Other abnormalities of gait and mobility: Secondary | ICD-10-CM | POA: Diagnosis not present

## 2015-02-20 DIAGNOSIS — G8929 Other chronic pain: Secondary | ICD-10-CM | POA: Diagnosis not present

## 2015-02-20 DIAGNOSIS — G4489 Other headache syndrome: Secondary | ICD-10-CM | POA: Diagnosis not present

## 2015-02-21 ENCOUNTER — Other Ambulatory Visit: Payer: Self-pay | Admitting: Pharmacist

## 2015-02-21 NOTE — Patient Outreach (Signed)
[place] [facility]) Care Management  [place]   02/21/2015  Gavin [name] 12-29-1928 [id number]  Subjective: Gavin [name] is a 81 y.o. male who is being followed by pharmacy for medication management. The patient has been experiencing daytime drowsiness and his wife (caretaker) was interested in moving some of his medication administration times around to see if that would help.  I spoke to his wife today. She reports that he takes cetirizine, duloxetine, and sertraline all in the morning. She is willing to try anything to help with the daytime drowsiness.   Objective:   Current Medications: Current Outpatient Prescriptions  Medication Sig Dispense Refill  . albuterol (PROVENTIL HFA;VENTOLIN HFA) 108 (90 BASE) MCG/ACT inhaler Inhale into the lungs every 6 (six) hours as needed for wheezing or shortness of breath. ProAir    . amLODipine (NORVASC) 2.5 MG tablet Take 1 tablet (2.5 mg total) by mouth daily. 30 tablet 11  . aspirin EC 81 MG tablet Take 81 mg by mouth at bedtime.     Marland Kitchen atorvastatin (LIPITOR) 40 MG tablet Take 1 tablet (40 mg total) by mouth at bedtime. 31 tablet 11  . cetirizine (ZYRTEC) 10 MG tablet Take 10 mg by mouth daily.    . Cholecalciferol (VITAMIN D) 2000 UNITS CAPS Take 2,000 Units by mouth daily before supper.    . clopidogrel (PLAVIX) 75 MG tablet Take 1 tablet (75 mg total) by mouth daily. 30 tablet 11  . diclofenac sodium (VOLTAREN) 1 % GEL Apply 2 g topically daily as needed (pain).    . DULoxetine (CYMBALTA) 60 MG capsule Take 60 mg by mouth daily.  0  . furosemide (LASIX) 40 MG tablet Take 40 mg by mouth daily as needed for fluid or edema.   0  . isosorbide mononitrate (IMDUR) 30 MG 24 hr tablet Take 30 mg by mouth 2 (two) times daily.   0  . Ketotifen Fumarate (ALAWAY OP) Place 1 drop into both eyes 2 (two) times daily as needed (dry eyes).    . Menthol, Topical Analgesic, (BIOFREEZE EX) Apply 1 application topically daily as needed  (pain).    . metoprolol (LOPRESSOR) 50 MG tablet 25-50 mg 2 (two) times daily. Take 1/2 tablet (25 mg) every morning and 1 tablet (50 mg) at night  0  . mirtazapine (REMERON) 30 MG tablet Take 30 mg by mouth daily before supper.   0  . Misc Natural Products (LUTEIN VISION BLEND PO) Take 1 tablet by mouth 2 (two) times daily.    . mometasone (NASONEX) 50 MCG/ACT nasal spray Place 1 spray into both nostrils at bedtime.     . Multiple Vitamin (MULTIVITAMIN) tablet Take 1 tablet by mouth daily.    . Multiple Vitamins-Minerals (PRESERVISION AREDS PO) Take 1 tablet by mouth 2 (two) times daily.    Marland Kitchen NAMZARIC 28-10 MG CP24 Take 1 capsule by mouth daily.  0  . NASONEX 50 MCG/ACT nasal spray Place 2 sprays into the nose 2 (two) times daily.     . nitroGLYCERIN (NITROSTAT) 0.4 MG SL tablet Place 0.4 mg under the tongue every 5 (five) minutes as needed for chest pain.    . pantoprazole (PROTONIX) 40 MG tablet take 1 tablet by mouth once daily 30 tablet 6  . QUEtiapine (SEROQUEL) 50 MG tablet Take 50 mg by mouth daily before supper.   1  . RANEXA 500 MG 12 hr tablet take 1 tablet by mouth twice a day 60 tablet 6  .  sertraline (ZOLOFT) 50 MG tablet Take 50 mg by mouth every morning.    . vitamin B-12 (CYANOCOBALAMIN) 1000 MCG tablet Take 1,000 mcg by mouth daily before supper.      No current facility-administered medications for this visit.    Functional Status: In your present state of health, do you have any difficulty performing the following activities: 02/13/2015 02/03/2015  Hearing? - Y  Vision? - Y  Difficulty concentrating or making decisions? - Y  Walking or climbing stairs? (No Data) Y  Dressing or bathing? - N  Doing errands, shopping? Y -  Conservation officer, nature and eating ? - N  Using the Toilet? - N  In the past six months, have you accidently leaked urine? - Y  Do you have problems with loss of bowel control? - N  Managing your Medications? - N  Managing your Finances? - N  Housekeeping or  managing your Housekeeping? - N    Fall/Depression Screening: PHQ 2/9 Scores 02/03/2015  PHQ - 2 Score 0    Assessment: 1. Medication management: please see previous note for assessment of medication regimen. Briefly, cetirizine, sertraline, and duloxetine could cause drowsiness though sertraline and duloxetine could also cause insomnia.   Plan: 1. Recommended that wife give patient his cetirizine in the evening to see if that helps with daytime drowsiness. If it doesn't work, she could consider giving him the sertraline or duloxetine in the evening. I instructed her not to move them all immediately and to do it one at a time to see if she can determine which is the causative agent. Patient verbalized understanding. I encouraged her to follow up with Dr. Nicki Reaper.  Nicoletta Ba, PharmD, [place] [facility] Resident [phone]

## 2015-02-23 DIAGNOSIS — M6281 Muscle weakness (generalized): Secondary | ICD-10-CM | POA: Diagnosis not present

## 2015-02-23 DIAGNOSIS — G8929 Other chronic pain: Secondary | ICD-10-CM | POA: Diagnosis not present

## 2015-02-23 DIAGNOSIS — R2689 Other abnormalities of gait and mobility: Secondary | ICD-10-CM | POA: Diagnosis not present

## 2015-02-23 DIAGNOSIS — G4489 Other headache syndrome: Secondary | ICD-10-CM | POA: Diagnosis not present

## 2015-02-23 DIAGNOSIS — F0781 Postconcussional syndrome: Secondary | ICD-10-CM | POA: Diagnosis not present

## 2015-02-24 ENCOUNTER — Encounter: Payer: Self-pay | Admitting: *Deleted

## 2015-02-24 ENCOUNTER — Telehealth: Payer: Self-pay | Admitting: Pharmacist

## 2015-02-24 ENCOUNTER — Other Ambulatory Visit: Payer: Self-pay | Admitting: Pharmacist

## 2015-02-24 NOTE — Patient Outreach (Signed)
[place] [facility]) Care Management  [place]   02/24/2015  Gavin [name] December 19, 1928 [id number]  Subjective: Gavin [name] is a 81 y.o. male who is being followed by pharmacy for medication management. The patient has been experiencing daytime drowsiness and his wife (caretaker) was interested in moving some of his medication administration times around to see if that would help.  I spoke to his wife today. She reports that she moved the cetirizine, sertraline, and duloxetine to the evening and that her husband has been able to stay awake during the day.   Objective:   Current Medications: Current Outpatient Prescriptions  Medication Sig Dispense Refill  . albuterol (PROVENTIL HFA;VENTOLIN HFA) 108 (90 BASE) MCG/ACT inhaler Inhale into the lungs every 6 (six) hours as needed for wheezing or shortness of breath. ProAir    . amLODipine (NORVASC) 2.5 MG tablet Take 1 tablet (2.5 mg total) by mouth daily. 30 tablet 11  . aspirin EC 81 MG tablet Take 81 mg by mouth at bedtime.     Marland Kitchen atorvastatin (LIPITOR) 40 MG tablet Take 1 tablet (40 mg total) by mouth at bedtime. 31 tablet 11  . cetirizine (ZYRTEC) 10 MG tablet Take 10 mg by mouth daily.    . Cholecalciferol (VITAMIN D) 2000 UNITS CAPS Take 2,000 Units by mouth daily before supper.    . clopidogrel (PLAVIX) 75 MG tablet Take 1 tablet (75 mg total) by mouth daily. 30 tablet 11  . diclofenac sodium (VOLTAREN) 1 % GEL Apply 2 g topically daily as needed (pain).    . DULoxetine (CYMBALTA) 60 MG capsule Take 60 mg by mouth daily.  0  . furosemide (LASIX) 40 MG tablet Take 40 mg by mouth daily as needed for fluid or edema.   0  . isosorbide mononitrate (IMDUR) 30 MG 24 hr tablet Take 30 mg by mouth 2 (two) times daily.   0  . Ketotifen Fumarate (ALAWAY OP) Place 1 drop into both eyes 2 (two) times daily as needed (dry eyes).    . Menthol, Topical Analgesic, (BIOFREEZE EX) Apply 1 application topically daily as needed (pain).     . metoprolol (LOPRESSOR) 50 MG tablet 25-50 mg 2 (two) times daily. Take 1/2 tablet (25 mg) every morning and 1 tablet (50 mg) at night  0  . mirtazapine (REMERON) 30 MG tablet Take 30 mg by mouth daily before supper.   0  . Misc Natural Products (LUTEIN VISION BLEND PO) Take 1 tablet by mouth 2 (two) times daily.    . mometasone (NASONEX) 50 MCG/ACT nasal spray Place 1 spray into both nostrils at bedtime.     . Multiple Vitamin (MULTIVITAMIN) tablet Take 1 tablet by mouth daily.    . Multiple Vitamins-Minerals (PRESERVISION AREDS PO) Take 1 tablet by mouth 2 (two) times daily.    Marland Kitchen NAMZARIC 28-10 MG CP24 Take 1 capsule by mouth daily.  0  . NASONEX 50 MCG/ACT nasal spray Place 2 sprays into the nose 2 (two) times daily.     . nitroGLYCERIN (NITROSTAT) 0.4 MG SL tablet Place 0.4 mg under the tongue every 5 (five) minutes as needed for chest pain.    . pantoprazole (PROTONIX) 40 MG tablet take 1 tablet by mouth once daily 30 tablet 6  . QUEtiapine (SEROQUEL) 50 MG tablet Take 50 mg by mouth daily before supper.   1  . RANEXA 500 MG 12 hr tablet take 1 tablet by mouth twice a day 60 tablet 6  .  sertraline (ZOLOFT) 50 MG tablet Take 50 mg by mouth every morning.    . vitamin B-12 (CYANOCOBALAMIN) 1000 MCG tablet Take 1,000 mcg by mouth daily before supper.      No current facility-administered medications for this visit.    Functional Status: In your present state of health, do you have any difficulty performing the following activities: 02/13/2015 02/03/2015  Hearing? - Y  Vision? - Y  Difficulty concentrating or making decisions? - Y  Walking or climbing stairs? (No Data) Y  Dressing or bathing? - N  Doing errands, shopping? Y -  Conservation officer, nature and eating ? - N  Using the Toilet? - N  In the past six months, have you accidently leaked urine? - Y  Do you have problems with loss of bowel control? - N  Managing your Medications? - N  Managing your Finances? - N  Housekeeping or managing  your Housekeeping? - N    Fall/Depression Screening: PHQ 2/9 Scores 02/03/2015  PHQ - 2 Score 0    Assessment: 1. Medication management: please see previous note for assessment of medication regimen. Briefly, cetirizine, sertraline, and duloxetine could cause drowsiness though sertraline and duloxetine could also cause insomnia. Patient's wife moved all three medications to the evening and he has been able to stay awake during the day. Because she moved all three to the evening, it is difficult to tell which medication was causing the daytime drowsiness.   Plan: 1. Medication management: wife will continue to give the patient his cetirizine, sertraline, and duloxetine in the evening to prevent daytime drowsiness. She denies any other pharmacy issues at this time. Will close out of the pharmacy program. Instructed patient's wife to let me know if any other issues arise.   Nicoletta Ba, PharmD, [place] [facility] Resident [phone]

## 2015-02-24 NOTE — Telephone Encounter (Signed)
error 

## 2015-02-27 ENCOUNTER — Other Ambulatory Visit: Payer: Self-pay | Admitting: *Deleted

## 2015-02-27 DIAGNOSIS — M6281 Muscle weakness (generalized): Secondary | ICD-10-CM | POA: Diagnosis not present

## 2015-02-27 DIAGNOSIS — R2689 Other abnormalities of gait and mobility: Secondary | ICD-10-CM | POA: Diagnosis not present

## 2015-02-27 DIAGNOSIS — G8929 Other chronic pain: Secondary | ICD-10-CM | POA: Diagnosis not present

## 2015-02-27 DIAGNOSIS — G4489 Other headache syndrome: Secondary | ICD-10-CM | POA: Diagnosis not present

## 2015-02-27 DIAGNOSIS — F0781 Postconcussional syndrome: Secondary | ICD-10-CM | POA: Diagnosis not present

## 2015-02-27 NOTE — Assessment & Plan Note (Signed)
Autotitration suggests pressure 6 might be sufficient, but needs chin strap to reduce leak. Has been on 9 cwp Plan DME Am Home Pt- reduce pressure 8 to 7

## 2015-02-27 NOTE — Assessment & Plan Note (Signed)
Exercise tolerance little changed- reflecting deconditioning, cardiac and pulmonary factors.

## 2015-02-27 NOTE — Patient Outreach (Signed)
[name] [facility]) Care Management  02/27/2015  Gavin [name] June 12, 1929 [id number]   Routine Scheduled Telephone Outreach  Subjective : " We   Assessment :  Chronic Medical Condition Hypertension: Telephone outreach to follow up on B/P readings, spoke with Gavin [name] that checks Gavin [name] B/P, she reports that his blood pressure reading are staying in the 150/80 range within PCP desired range.  Falls: No falls since last visit , Gavin [name] will continue outpatient Physical and Occupational therapy sessions at [facility] for another month reported per his wife.  Gavin [name] very appreciative of [facility] pharmacy consult regarding time change of medication to decrease patient sleepiness during the day, she reports that he is not so sleepy during the day, and sleeps well during the night. They denied further needs or concerns at this time, reviewed contact information to call for concerns.  Plan: Schedule RNCM home visit on 03/28/15 at 10:00   Joylene Draft, [place], [place] Care Management [phone]

## 2015-03-02 DIAGNOSIS — F0781 Postconcussional syndrome: Secondary | ICD-10-CM | POA: Diagnosis not present

## 2015-03-02 DIAGNOSIS — M6281 Muscle weakness (generalized): Secondary | ICD-10-CM | POA: Diagnosis not present

## 2015-03-02 DIAGNOSIS — G4489 Other headache syndrome: Secondary | ICD-10-CM | POA: Diagnosis not present

## 2015-03-02 DIAGNOSIS — G8929 Other chronic pain: Secondary | ICD-10-CM | POA: Diagnosis not present

## 2015-03-02 DIAGNOSIS — R2689 Other abnormalities of gait and mobility: Secondary | ICD-10-CM | POA: Diagnosis not present

## 2015-03-02 DIAGNOSIS — I10 Essential (primary) hypertension: Secondary | ICD-10-CM | POA: Diagnosis not present

## 2015-03-02 DIAGNOSIS — F0280 Dementia in other diseases classified elsewhere without behavioral disturbance: Secondary | ICD-10-CM | POA: Diagnosis not present

## 2015-03-02 DIAGNOSIS — G301 Alzheimer's disease with late onset: Secondary | ICD-10-CM | POA: Diagnosis not present

## 2015-03-06 DIAGNOSIS — G8929 Other chronic pain: Secondary | ICD-10-CM | POA: Diagnosis not present

## 2015-03-06 DIAGNOSIS — M6281 Muscle weakness (generalized): Secondary | ICD-10-CM | POA: Diagnosis not present

## 2015-03-06 DIAGNOSIS — F0781 Postconcussional syndrome: Secondary | ICD-10-CM | POA: Diagnosis not present

## 2015-03-06 DIAGNOSIS — G4489 Other headache syndrome: Secondary | ICD-10-CM | POA: Diagnosis not present

## 2015-03-06 DIAGNOSIS — R2689 Other abnormalities of gait and mobility: Secondary | ICD-10-CM | POA: Diagnosis not present

## 2015-03-09 DIAGNOSIS — F0781 Postconcussional syndrome: Secondary | ICD-10-CM | POA: Diagnosis not present

## 2015-03-09 DIAGNOSIS — R2689 Other abnormalities of gait and mobility: Secondary | ICD-10-CM | POA: Diagnosis not present

## 2015-03-09 DIAGNOSIS — G4489 Other headache syndrome: Secondary | ICD-10-CM | POA: Diagnosis not present

## 2015-03-09 DIAGNOSIS — G8929 Other chronic pain: Secondary | ICD-10-CM | POA: Diagnosis not present

## 2015-03-09 DIAGNOSIS — M6281 Muscle weakness (generalized): Secondary | ICD-10-CM | POA: Diagnosis not present

## 2015-03-10 DIAGNOSIS — I10 Essential (primary) hypertension: Secondary | ICD-10-CM | POA: Diagnosis not present

## 2015-03-10 DIAGNOSIS — E780 Pure hypercholesterolemia: Secondary | ICD-10-CM | POA: Diagnosis not present

## 2015-03-10 DIAGNOSIS — R079 Chest pain, unspecified: Secondary | ICD-10-CM | POA: Diagnosis not present

## 2015-03-10 DIAGNOSIS — E569 Vitamin deficiency, unspecified: Secondary | ICD-10-CM | POA: Diagnosis not present

## 2015-03-13 DIAGNOSIS — F0781 Postconcussional syndrome: Secondary | ICD-10-CM | POA: Diagnosis not present

## 2015-03-13 DIAGNOSIS — G4489 Other headache syndrome: Secondary | ICD-10-CM | POA: Diagnosis not present

## 2015-03-13 DIAGNOSIS — G8929 Other chronic pain: Secondary | ICD-10-CM | POA: Diagnosis not present

## 2015-03-13 DIAGNOSIS — R2689 Other abnormalities of gait and mobility: Secondary | ICD-10-CM | POA: Diagnosis not present

## 2015-03-13 DIAGNOSIS — M6281 Muscle weakness (generalized): Secondary | ICD-10-CM | POA: Diagnosis not present

## 2015-03-15 ENCOUNTER — Other Ambulatory Visit: Payer: Self-pay | Admitting: Cardiovascular Disease

## 2015-03-16 DIAGNOSIS — G4489 Other headache syndrome: Secondary | ICD-10-CM | POA: Diagnosis not present

## 2015-03-16 DIAGNOSIS — M6281 Muscle weakness (generalized): Secondary | ICD-10-CM | POA: Diagnosis not present

## 2015-03-16 DIAGNOSIS — F0781 Postconcussional syndrome: Secondary | ICD-10-CM | POA: Diagnosis not present

## 2015-03-16 DIAGNOSIS — G8929 Other chronic pain: Secondary | ICD-10-CM | POA: Diagnosis not present

## 2015-03-16 DIAGNOSIS — R2689 Other abnormalities of gait and mobility: Secondary | ICD-10-CM | POA: Diagnosis not present

## 2015-03-16 NOTE — Telephone Encounter (Signed)
REFILL 

## 2015-03-17 ENCOUNTER — Other Ambulatory Visit: Payer: Self-pay | Admitting: *Deleted

## 2015-03-17 NOTE — Patient Outreach (Signed)
[place] [facility]) Care Management  03/17/2015  Gavin [name] 06-Dec-1928 [id number]   Telephone call from Mr. Afzal wife Karen,concerned regarding patient with increased swelling in lower legs, gradual weight gain on 6/15 weight 197, now up to 202, no shortness breath, but she noted an area on left ankle " leaking clear fluid, it just popped open. She denies patient injury to area. Encouraged to keep feet elevated. Advised that it is best to notify Dr. Bary Leriche office. Office returned call to her for  new medication order changes.  Instructed to call with concerns. Continue to weigh daily and record. Watch salt intake.  Joylene Draft, RN, [place] [facility] Management [phone]

## 2015-03-20 ENCOUNTER — Other Ambulatory Visit: Payer: Self-pay

## 2015-03-20 DIAGNOSIS — H26493 Other secondary cataract, bilateral: Secondary | ICD-10-CM | POA: Diagnosis not present

## 2015-03-21 DIAGNOSIS — G8929 Other chronic pain: Secondary | ICD-10-CM | POA: Diagnosis not present

## 2015-03-21 DIAGNOSIS — F0781 Postconcussional syndrome: Secondary | ICD-10-CM | POA: Diagnosis not present

## 2015-03-21 DIAGNOSIS — M6281 Muscle weakness (generalized): Secondary | ICD-10-CM | POA: Diagnosis not present

## 2015-03-21 DIAGNOSIS — R2689 Other abnormalities of gait and mobility: Secondary | ICD-10-CM | POA: Diagnosis not present

## 2015-03-21 DIAGNOSIS — G4489 Other headache syndrome: Secondary | ICD-10-CM | POA: Diagnosis not present

## 2015-03-23 DIAGNOSIS — R2689 Other abnormalities of gait and mobility: Secondary | ICD-10-CM | POA: Diagnosis not present

## 2015-03-23 DIAGNOSIS — M6281 Muscle weakness (generalized): Secondary | ICD-10-CM | POA: Diagnosis not present

## 2015-03-23 DIAGNOSIS — F0781 Postconcussional syndrome: Secondary | ICD-10-CM | POA: Diagnosis not present

## 2015-03-23 DIAGNOSIS — G8929 Other chronic pain: Secondary | ICD-10-CM | POA: Diagnosis not present

## 2015-03-23 DIAGNOSIS — G4489 Other headache syndrome: Secondary | ICD-10-CM | POA: Diagnosis not present

## 2015-03-28 ENCOUNTER — Other Ambulatory Visit: Payer: Self-pay | Admitting: *Deleted

## 2015-03-28 NOTE — Patient Outreach (Signed)
[place] [facility]) Care Management   03/28/2015  Gavin [name] Feb 06, 1929 [id number]  Gavin [name] is an 81 y.o. male  Subjective: "I,am getting a slow start to the day, stayed up late last night." Mrs. Flury present during the visit, reports no falls since last visit, reports that the swelling in this legs has much improved his weight is coming down, and blood pressure is staying in the MD preferred range.  Objective:   ROS  Physical Exam  Constitutional: He is oriented to person, place, and time. Vital signs are normal. He appears well-developed and well-nourished.  Cardiovascular: Normal rate, regular rhythm and normal heart sounds.   Respiratory: Breath sounds normal.  Neurological: He is alert and oriented to person, place, and time.  Skin:     Left inner lower leg wound,yellowish appearance at wound base, 1.5 cm length by .5 cm width, 0 depth, no odor with Band-Aid covering site.  Psychiatric: He has a normal mood and affect.    Current Medications:   Current Outpatient Prescriptions  Medication Sig Dispense Refill  . amLODipine (NORVASC) 2.5 MG tablet Take 1 tablet (2.5 mg total) by mouth daily. 30 tablet 11  . aspirin EC 81 MG tablet Take 81 mg by mouth at bedtime.     Marland Kitchen atorvastatin (LIPITOR) 40 MG tablet Take 1 tablet (40 mg total) by mouth at bedtime. 31 tablet 11  . cetirizine (ZYRTEC) 10 MG tablet Take 10 mg by mouth daily.    . Cholecalciferol (VITAMIN D) 2000 UNITS CAPS Take 2,000 Units by mouth daily before supper.    . clopidogrel (PLAVIX) 75 MG tablet Take 1 tablet (75 mg total) by mouth daily. 30 tablet 11  . diclofenac sodium (VOLTAREN) 1 % GEL Apply 2 g topically daily as needed (pain).    . DULoxetine (CYMBALTA) 60 MG capsule Take 60 mg by mouth daily.  0  . furosemide (LASIX) 40 MG tablet Take 40 mg by mouth daily. Take 1/2 tablet daily  0  . ibuprofen (ADVIL,MOTRIN) 200 MG tablet Take 200 mg by mouth daily as needed for headache.    .  isosorbide mononitrate (IMDUR) 30 MG 24 hr tablet Take 30 mg by mouth 2 (two) times daily.   0  . Ketotifen Fumarate (ALAWAY OP) Place 1 drop into both eyes 2 (two) times daily as needed (dry eyes).    . Menthol, Topical Analgesic, (BIOFREEZE EX) Apply 1 application topically daily as needed (pain).    . metoprolol (LOPRESSOR) 50 MG tablet take 1 tablet by mouth twice a day 30 tablet 4  . mirtazapine (REMERON) 30 MG tablet Take 30 mg by mouth daily before supper.   0  . Misc Natural Products (LUTEIN VISION BLEND PO) Take 1 tablet by mouth 2 (two) times daily.    . mometasone (NASONEX) 50 MCG/ACT nasal spray Place 1 spray into both nostrils at bedtime.     . Multiple Vitamin (MULTIVITAMIN) tablet Take 1 tablet by mouth daily.    . Multiple Vitamins-Minerals (PRESERVISION AREDS PO) Take 1 tablet by mouth 2 (two) times daily.    Marland Kitchen NAMZARIC 28-10 MG CP24 Take 1 capsule by mouth daily.  0  . nitroGLYCERIN (NITROSTAT) 0.4 MG SL tablet Place 0.4 mg under the tongue every 5 (five) minutes as needed for chest pain.    . pantoprazole (PROTONIX) 40 MG tablet take 1 tablet by mouth once daily 30 tablet 6  . QUEtiapine (SEROQUEL) 50 MG tablet Take 50 mg by  mouth daily before supper.   1  . RANEXA 500 MG 12 hr tablet take 1 tablet by mouth twice a day 60 tablet 6  . sertraline (ZOLOFT) 50 MG tablet Take 50 mg by mouth every morning.    . vitamin B-12 (CYANOCOBALAMIN) 1000 MCG tablet Take 1,000 mcg by mouth daily before supper.     Marland Kitchen albuterol (PROVENTIL HFA;VENTOLIN HFA) 108 (90 BASE) MCG/ACT inhaler Inhale into the lungs every 6 (six) hours as needed for wheezing or shortness of breath. ProAir    . NASONEX 50 MCG/ACT nasal spray Place 2 sprays into the nose 2 (two) times daily.      No current facility-administered medications for this visit.    Functional Status:   In your present state of health, do you have any difficulty performing the following activities: 02/13/2015 02/03/2015  Hearing? - Y  Vision?  - Y  Difficulty concentrating or making decisions? - Y  Walking or climbing stairs? (No Data) Y  Dressing or bathing? - N  Doing errands, shopping? Y -  Conservation officer, nature and eating ? - N  Using the Toilet? - N  In the past six months, have you accidently leaked urine? - Y  Do you have problems with loss of bowel control? - N  Managing your Medications? - N  Managing your Finances? - N  Housekeeping or managing your Housekeeping? - N    Fall/Depression Screening:    PHQ 2/9 Scores 03/28/2015 02/03/2015  PHQ - 2 Score 0 0    Assessment:  On arrival to visit patient noted ambulating with his rolling walker., Mrs Sackmann reports that he uses it all the time. Patient reports that he has graduated from his outpatient occupational and speech therapy, and will continue physical therapy for 1 more month, he hopes to be able to go back to his maintenance cardiac rehab program at some point He has decreased swelling in his legs since MD ordered medication regimen  Metolazone for 4 days reported by his wife. He has continued to keep his legs elevated at intervals throughout the day, with only slight nonpitting edema noted  to lower legs. I  Reviewed daily weights and blood pressure reading that Mrs.Monte has recorded. Noted band Aid to left lower legs, Patient stated that skin just "popped open" one day, its been about 2 weeks, and patient has been keeping a band aid at site, and Mrs Duman states that they  have used some neosporin type cream to wound. Gavin [name] talked about a wound that he had previously on his right leg that required a long course of treatment at wound center.Will notify Dr.Scott's office of concern related to wound on left leg, due to patient next office visit is July 13 he may require sooner visit.  Plan: I spoke with Christy at Dr. Bary Leriche office, she will call Mrs. Dejonge to arrange visit to evaluate wound. Scheduled follow up home visit on August 4, at [place] Problem [facility]         Patient Outreach from 03/28/2015 in [place] Problem [facility]  [place] for Problem One  Active   [facility] Long Term Goal (31-90 days)  Patient will not experience a fall within the next 31 days   [facility] Long Term Goal Start Date  02/13/15   [facility] Long Term Goal Met Date  03/28/15   [facility] CM Short Term Goal #1 (0-30 days)  Patient will use his walker at al times when ambulating for the next 30 days   [facility] CM Short Term Goal #1 Start Date  02/13/15   [facility] CM Short Term Goal #1 Met Date  03/28/15   Interventions for Short Term Goal #1  Reviewed importance of using walker for stability   [facility] CM Short Term Goal #2 (0-30 days)  Patient will walk for al least 10 minutes a day, 3 days a week, within the next 30 days    [facility] CM Short Term Goal #2 Start Date  02/13/15   [facility] CM Short Term Goal #2 Met Date  03/28/15   Interventions for Short Term Goal #2  Discussed importance of low key exercise to strengthen muscles and improve balance, suggested walking as he does on their shopping trips to [employer], walking in home five minutes in morning  & 5 minutes in pm.esp on days that he does not have therapy scheduled .     [facility] CM Care Plan Problem Two        Patient Outreach from 03/28/2015 in [place] Problem Two  Self Care Management of Hypertension   Care Plan for Problem Two  Active   [facility] Long Term Goal (31-90) days  Patient and wife will verbalize understanding Hypertension self care monitoring importance   [facility] Long Term Goal Start Date  02/13/15   [facility] Long Term Goal Met Date  03/28/15   [facility] CM Short Term Goal #1 (0-30 days)  Patient will have his blood  pressure checked and recorded by his wife at least 3 days per week within the next 30 days    [facility] CM Short Term Goal #1 Start Date  02/13/15   [facility] CM Short Term Goal #1 Met Date   03/28/15   Interventions for Short Term Goal #2   Discussed importance of blood pressure monitoring, related to heart disase  and taking medication as ordered by MD, notify Md of blood pressure reading per set parameter greater than 170/90. [discussed to continue daily weights]   [facility] CM Short Term Goal #2 (0-30 days)  Patient will report weighing self daily and recording within the next 30 days. [discussed to continue]   [facility] CM Short Term Goal #2 Start Date  02/13/15   [facility] CM Short Term Goal #2 Met Date  03/28/15   Interventions for Short Term Goal #2  Discussed importance of maintaining a healthy weight  with hypertension, and monitoring salt and fat intake, and notifying MD of weight gain greater than 5 pounds in one week, or sob.    [place] Problem Three        Patient Outreach from 03/28/2015 in [place] Problem Three  Wound Left Leg   Care Plan for Problem Three  Active   [facility] Long Term Goal (31-90) days  Patient will experience wound healing in the next 31 days   [facility] Long Term Goal Start Date  03/28/15   Inter[facility] for Problem [facility] Long Term Goal  Discussed importance of nutrition, activity and diet in wound healing   [facility] CM Short Term Goal #1 (0-30 days)  Patient will verbalize and demostrate importance of MD follow up on  Wounds   [facility] CM Short Term Goal #1 Start Date  03/28/15   Interventions for Short Term Goal #1  Discussed  importance of notifying MD of wounds sooner and following care outlined.  Joylene Draft, RN, [place] Care Management [phone]

## 2015-03-29 DIAGNOSIS — R238 Other skin changes: Secondary | ICD-10-CM | POA: Diagnosis not present

## 2015-03-30 DIAGNOSIS — N3281 Overactive bladder: Secondary | ICD-10-CM | POA: Diagnosis not present

## 2015-03-30 DIAGNOSIS — N401 Enlarged prostate with lower urinary tract symptoms: Secondary | ICD-10-CM | POA: Diagnosis not present

## 2015-03-31 DIAGNOSIS — M6281 Muscle weakness (generalized): Secondary | ICD-10-CM | POA: Diagnosis not present

## 2015-03-31 DIAGNOSIS — F0781 Postconcussional syndrome: Secondary | ICD-10-CM | POA: Diagnosis not present

## 2015-03-31 DIAGNOSIS — G4489 Other headache syndrome: Secondary | ICD-10-CM | POA: Diagnosis not present

## 2015-03-31 DIAGNOSIS — G8929 Other chronic pain: Secondary | ICD-10-CM | POA: Diagnosis not present

## 2015-04-03 DIAGNOSIS — G4489 Other headache syndrome: Secondary | ICD-10-CM | POA: Diagnosis not present

## 2015-04-03 DIAGNOSIS — M6281 Muscle weakness (generalized): Secondary | ICD-10-CM | POA: Diagnosis not present

## 2015-04-03 DIAGNOSIS — G8929 Other chronic pain: Secondary | ICD-10-CM | POA: Diagnosis not present

## 2015-04-03 DIAGNOSIS — F0781 Postconcussional syndrome: Secondary | ICD-10-CM | POA: Diagnosis not present

## 2015-04-05 DIAGNOSIS — I10 Essential (primary) hypertension: Secondary | ICD-10-CM | POA: Diagnosis not present

## 2015-04-05 DIAGNOSIS — F0781 Postconcussional syndrome: Secondary | ICD-10-CM | POA: Diagnosis not present

## 2015-04-05 DIAGNOSIS — L97311 Non-pressure chronic ulcer of right ankle limited to breakdown of skin: Secondary | ICD-10-CM | POA: Diagnosis not present

## 2015-04-06 DIAGNOSIS — F0781 Postconcussional syndrome: Secondary | ICD-10-CM | POA: Diagnosis not present

## 2015-04-06 DIAGNOSIS — G8929 Other chronic pain: Secondary | ICD-10-CM | POA: Diagnosis not present

## 2015-04-06 DIAGNOSIS — G4489 Other headache syndrome: Secondary | ICD-10-CM | POA: Diagnosis not present

## 2015-04-06 DIAGNOSIS — M6281 Muscle weakness (generalized): Secondary | ICD-10-CM | POA: Diagnosis not present

## 2015-04-07 ENCOUNTER — Other Ambulatory Visit: Payer: Self-pay | Admitting: Cardiovascular Disease

## 2015-04-07 ENCOUNTER — Telehealth (HOSPITAL_COMMUNITY): Payer: Self-pay | Admitting: *Deleted

## 2015-04-07 DIAGNOSIS — I739 Peripheral vascular disease, unspecified: Secondary | ICD-10-CM

## 2015-04-09 DIAGNOSIS — D649 Anemia, unspecified: Secondary | ICD-10-CM | POA: Diagnosis not present

## 2015-04-09 DIAGNOSIS — R609 Edema, unspecified: Secondary | ICD-10-CM | POA: Diagnosis not present

## 2015-04-09 DIAGNOSIS — G4700 Insomnia, unspecified: Secondary | ICD-10-CM | POA: Diagnosis not present

## 2015-04-09 DIAGNOSIS — G4733 Obstructive sleep apnea (adult) (pediatric): Secondary | ICD-10-CM | POA: Diagnosis not present

## 2015-04-10 DIAGNOSIS — G4489 Other headache syndrome: Secondary | ICD-10-CM | POA: Diagnosis not present

## 2015-04-10 DIAGNOSIS — F0781 Postconcussional syndrome: Secondary | ICD-10-CM | POA: Diagnosis not present

## 2015-04-10 DIAGNOSIS — M6281 Muscle weakness (generalized): Secondary | ICD-10-CM | POA: Diagnosis not present

## 2015-04-10 DIAGNOSIS — G8929 Other chronic pain: Secondary | ICD-10-CM | POA: Diagnosis not present

## 2015-04-12 DIAGNOSIS — F0390 Unspecified dementia without behavioral disturbance: Secondary | ICD-10-CM | POA: Diagnosis not present

## 2015-04-12 DIAGNOSIS — I10 Essential (primary) hypertension: Secondary | ICD-10-CM | POA: Diagnosis not present

## 2015-04-12 DIAGNOSIS — Z87891 Personal history of nicotine dependence: Secondary | ICD-10-CM | POA: Diagnosis not present

## 2015-04-12 DIAGNOSIS — M1990 Unspecified osteoarthritis, unspecified site: Secondary | ICD-10-CM | POA: Diagnosis not present

## 2015-04-12 DIAGNOSIS — Z9861 Coronary angioplasty status: Secondary | ICD-10-CM | POA: Diagnosis not present

## 2015-04-12 DIAGNOSIS — I4891 Unspecified atrial fibrillation: Secondary | ICD-10-CM | POA: Diagnosis not present

## 2015-04-12 DIAGNOSIS — I872 Venous insufficiency (chronic) (peripheral): Secondary | ICD-10-CM | POA: Diagnosis not present

## 2015-04-12 DIAGNOSIS — I509 Heart failure, unspecified: Secondary | ICD-10-CM | POA: Diagnosis not present

## 2015-04-12 DIAGNOSIS — L97821 Non-pressure chronic ulcer of other part of left lower leg limited to breakdown of skin: Secondary | ICD-10-CM | POA: Diagnosis not present

## 2015-04-12 DIAGNOSIS — G4730 Sleep apnea, unspecified: Secondary | ICD-10-CM | POA: Diagnosis not present

## 2015-04-12 DIAGNOSIS — I252 Old myocardial infarction: Secondary | ICD-10-CM | POA: Diagnosis not present

## 2015-04-12 DIAGNOSIS — I87312 Chronic venous hypertension (idiopathic) with ulcer of left lower extremity: Secondary | ICD-10-CM | POA: Diagnosis not present

## 2015-04-12 DIAGNOSIS — I2510 Atherosclerotic heart disease of native coronary artery without angina pectoris: Secondary | ICD-10-CM | POA: Diagnosis not present

## 2015-04-13 DIAGNOSIS — G8929 Other chronic pain: Secondary | ICD-10-CM | POA: Diagnosis not present

## 2015-04-13 DIAGNOSIS — F0781 Postconcussional syndrome: Secondary | ICD-10-CM | POA: Diagnosis not present

## 2015-04-13 DIAGNOSIS — G4489 Other headache syndrome: Secondary | ICD-10-CM | POA: Diagnosis not present

## 2015-04-13 DIAGNOSIS — M6281 Muscle weakness (generalized): Secondary | ICD-10-CM | POA: Diagnosis not present

## 2015-04-14 ENCOUNTER — Encounter (HOSPITAL_COMMUNITY): Payer: Self-pay | Admitting: *Deleted

## 2015-04-18 DIAGNOSIS — I872 Venous insufficiency (chronic) (peripheral): Secondary | ICD-10-CM | POA: Diagnosis not present

## 2015-04-18 DIAGNOSIS — I87312 Chronic venous hypertension (idiopathic) with ulcer of left lower extremity: Secondary | ICD-10-CM | POA: Diagnosis not present

## 2015-04-18 DIAGNOSIS — L97821 Non-pressure chronic ulcer of other part of left lower leg limited to breakdown of skin: Secondary | ICD-10-CM | POA: Diagnosis not present

## 2015-04-19 DIAGNOSIS — N3281 Overactive bladder: Secondary | ICD-10-CM | POA: Diagnosis not present

## 2015-04-19 DIAGNOSIS — N401 Enlarged prostate with lower urinary tract symptoms: Secondary | ICD-10-CM | POA: Diagnosis not present

## 2015-04-21 DIAGNOSIS — I4891 Unspecified atrial fibrillation: Secondary | ICD-10-CM | POA: Diagnosis not present

## 2015-04-21 DIAGNOSIS — M1991 Primary osteoarthritis, unspecified site: Secondary | ICD-10-CM | POA: Diagnosis not present

## 2015-04-21 DIAGNOSIS — I509 Heart failure, unspecified: Secondary | ICD-10-CM | POA: Diagnosis not present

## 2015-04-21 DIAGNOSIS — L97829 Non-pressure chronic ulcer of other part of left lower leg with unspecified severity: Secondary | ICD-10-CM | POA: Diagnosis not present

## 2015-04-21 DIAGNOSIS — I10 Essential (primary) hypertension: Secondary | ICD-10-CM | POA: Diagnosis not present

## 2015-04-21 DIAGNOSIS — F0390 Unspecified dementia without behavioral disturbance: Secondary | ICD-10-CM | POA: Diagnosis not present

## 2015-04-22 IMAGING — CT CT HEAD W/O CM
2 series · 16 of 30 positions shown, 20 images · non-contrast
Comparison: Prior CT from 07/14/2012

CLINICAL DATA: Increased headache, on Coumadin

CT HEAD WITHOUT CONTRAST
TECHNIQUE: Contiguous axial images were obtained from the base of
the skull through the vertex without contrast.

[Series 2: head w/o · axial · non-contrast · 0.49mm/px · z∈[+94,+224]mm · 13 of 32 slices shown, 17 images]
[im 3/32  brain]
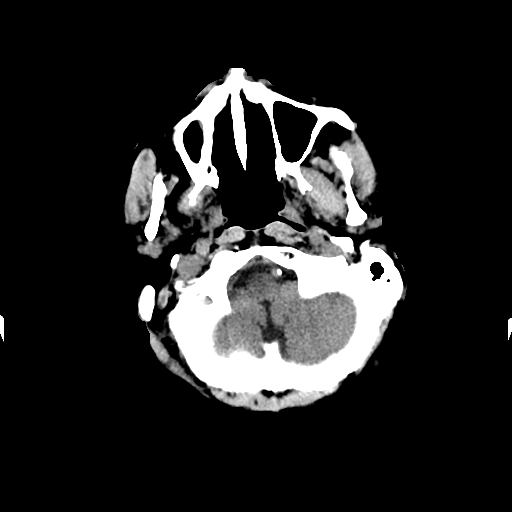
[im 3/32  bone]
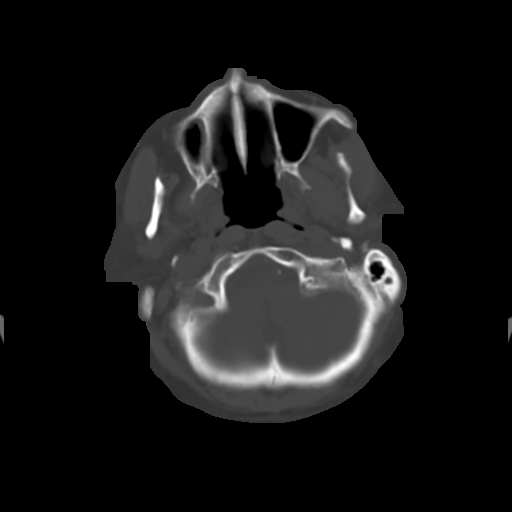
[im 5/32  brain]
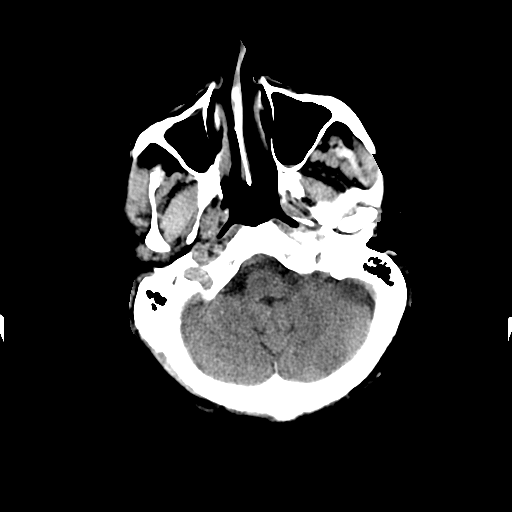
[im 7/32  brain]
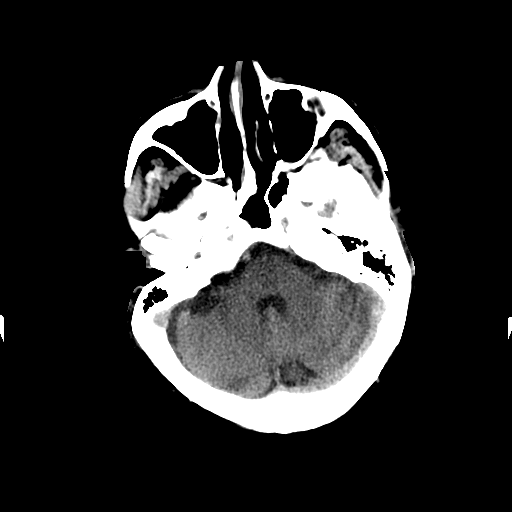
[im 9/32  brain]
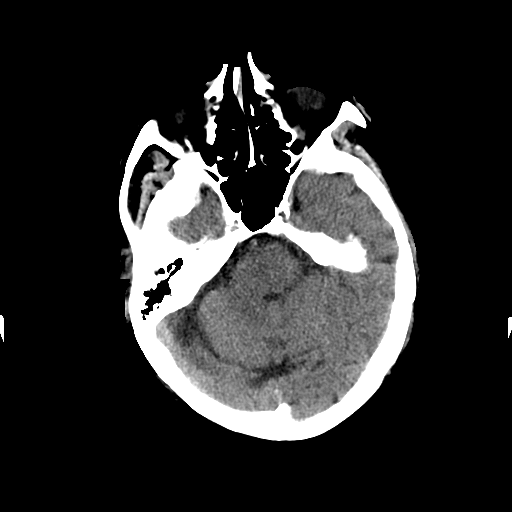
[im 12/32  brain]
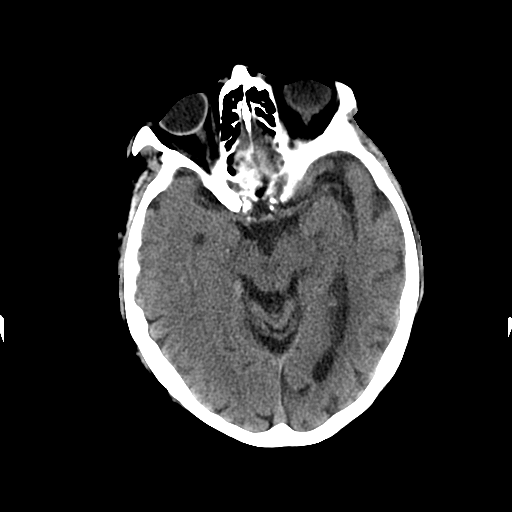
[im 12/32  bone]
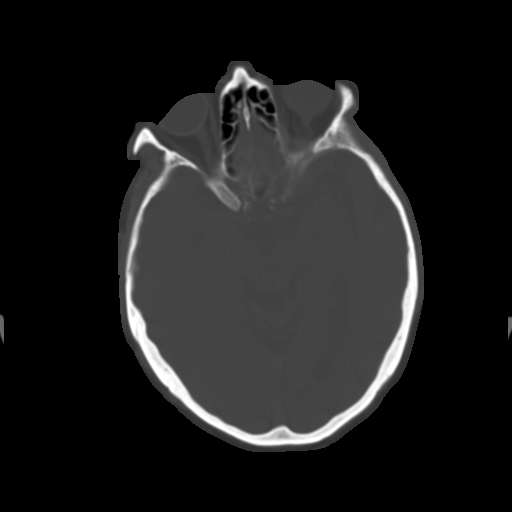
[im 14/32  brain]
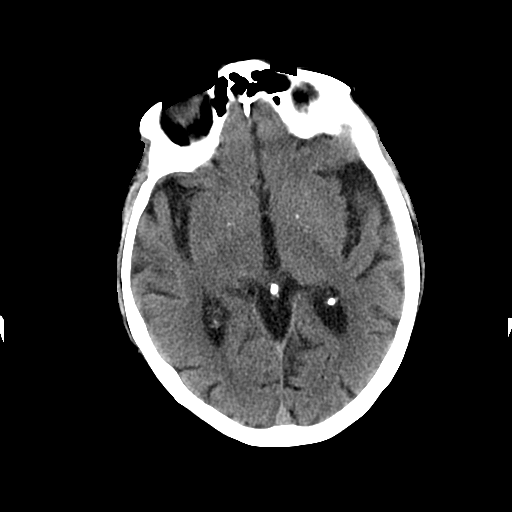
[im 16/32  brain]
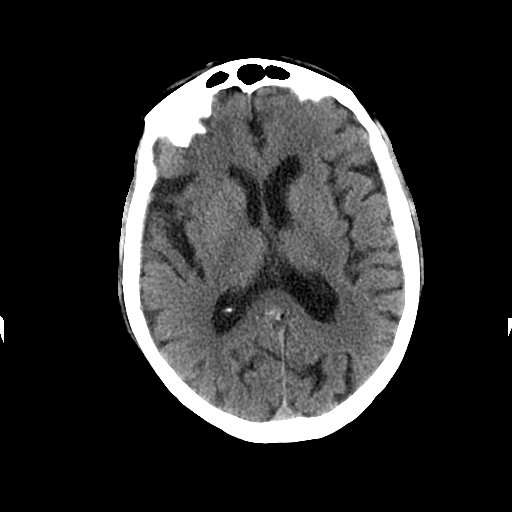
[im 18/32  brain]
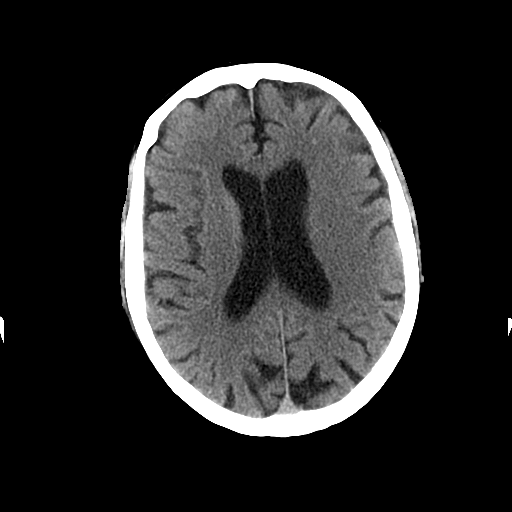
[im 20/32  brain]
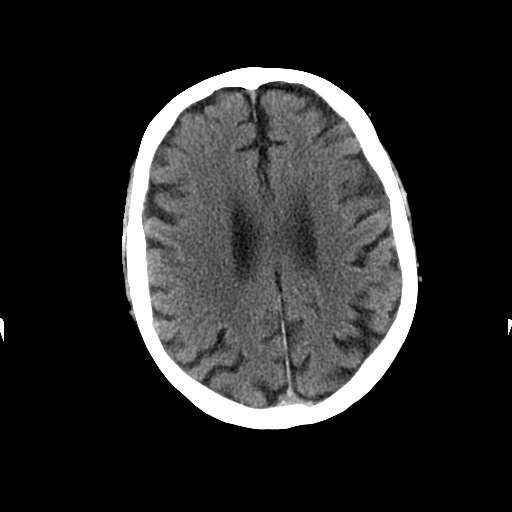
[im 20/32  bone]
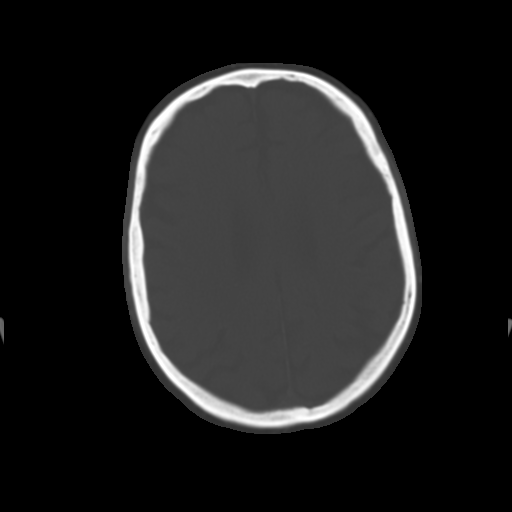
[im 23/32  brain]
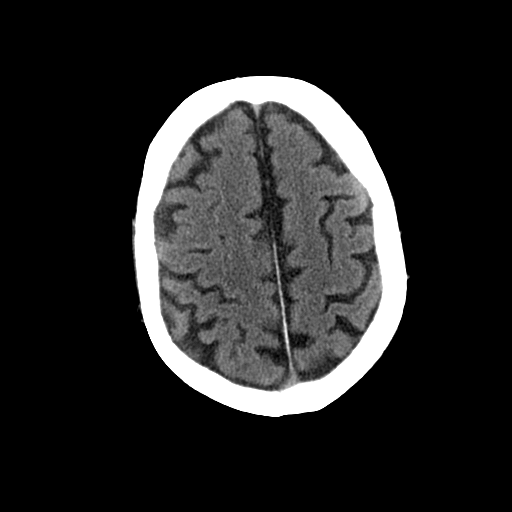
[im 25/32  brain]
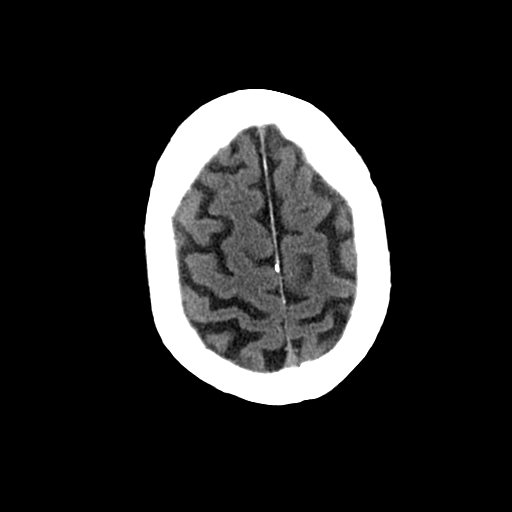
[im 27/32  brain]
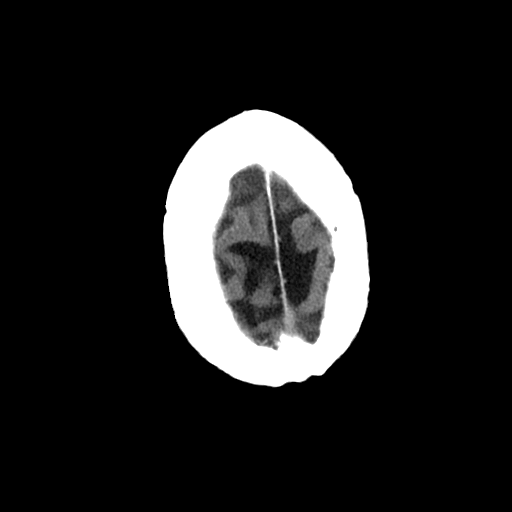
[im 29/32  brain]
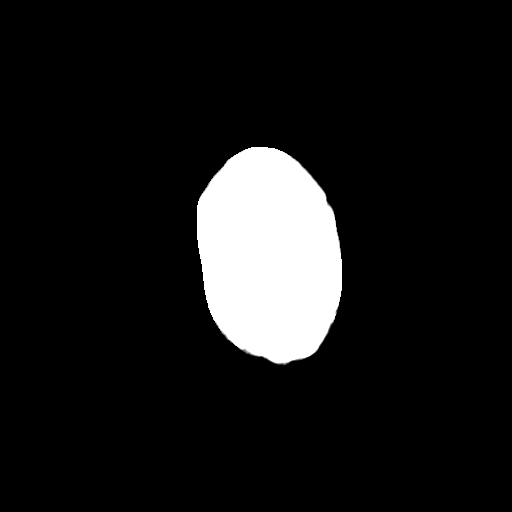
[im 29/32  bone]
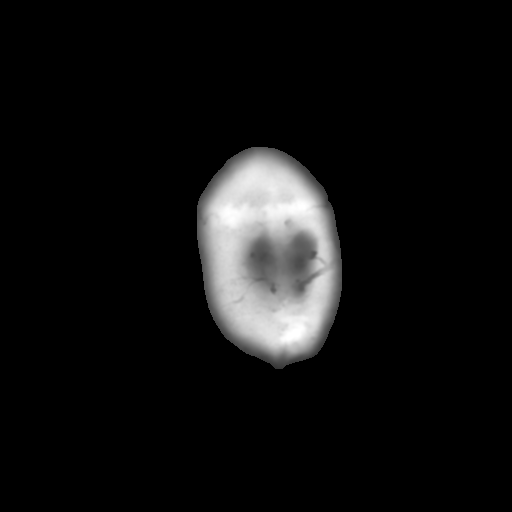

[Series 3: head w/o bone · axial · non-contrast · 0.49mm/px · z∈[+94,+139]mm · 3 of 32 slices shown]
[im 3/32  bone]
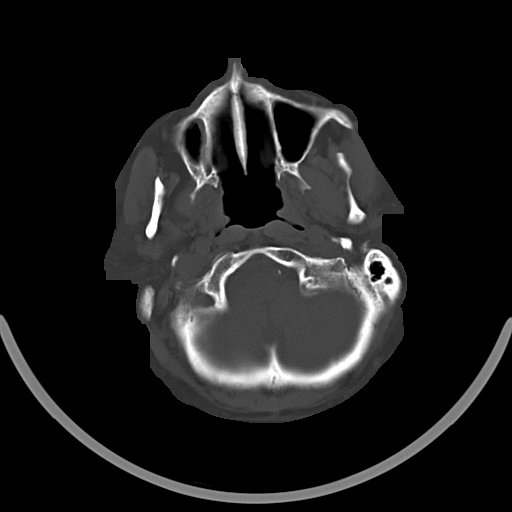
[im 7/32  bone]
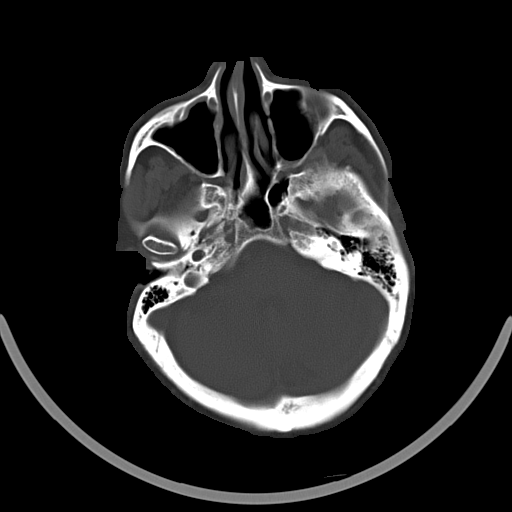
[im 12/32  bone]
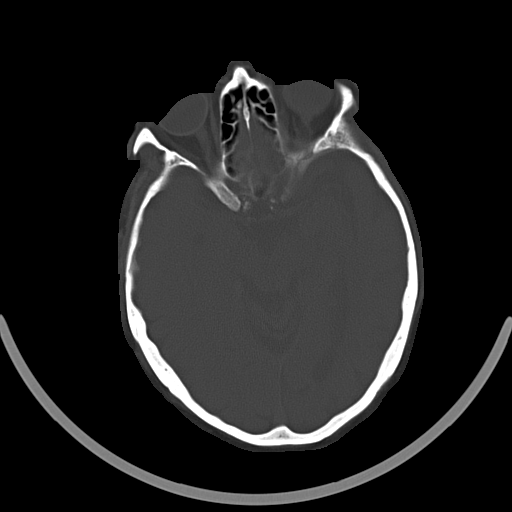

[16 of 30 positions shown; findings below may reference images not displayed]

FINDINGS: Diffuse prominence of the CSF containing spaces is
consistent with generalized atrophy. Scattered and confluent
hypodensity within the periventricular white matter is consistent
with chronic small vessel ischemic changes. Small basal ganglia
calcifications are noted. There is no acute intracranial hemorrhage
or infarct.  No extra-axial fluid collection.  Calvarium is intact.

Minimal mucosal thickening is present within the maxillary sinus on
the left.  Mastoid air cells are clear. Atherosclerotic
calcifications are noted within the partially visualized vertebral
arteries.
IMPRESSION: Atrophy with chronic small vessel ischemic changes.  No acute
intracranial hemorrhage or infarct.

## 2015-04-24 DIAGNOSIS — I10 Essential (primary) hypertension: Secondary | ICD-10-CM | POA: Diagnosis not present

## 2015-04-24 DIAGNOSIS — L97829 Non-pressure chronic ulcer of other part of left lower leg with unspecified severity: Secondary | ICD-10-CM | POA: Diagnosis not present

## 2015-04-24 DIAGNOSIS — I509 Heart failure, unspecified: Secondary | ICD-10-CM | POA: Diagnosis not present

## 2015-04-24 DIAGNOSIS — M1991 Primary osteoarthritis, unspecified site: Secondary | ICD-10-CM | POA: Diagnosis not present

## 2015-04-24 DIAGNOSIS — F0390 Unspecified dementia without behavioral disturbance: Secondary | ICD-10-CM | POA: Diagnosis not present

## 2015-04-24 DIAGNOSIS — I4891 Unspecified atrial fibrillation: Secondary | ICD-10-CM | POA: Diagnosis not present

## 2015-04-25 DIAGNOSIS — L97821 Non-pressure chronic ulcer of other part of left lower leg limited to breakdown of skin: Secondary | ICD-10-CM | POA: Diagnosis not present

## 2015-04-25 DIAGNOSIS — L97829 Non-pressure chronic ulcer of other part of left lower leg with unspecified severity: Secondary | ICD-10-CM | POA: Diagnosis not present

## 2015-04-25 DIAGNOSIS — I872 Venous insufficiency (chronic) (peripheral): Secondary | ICD-10-CM | POA: Diagnosis not present

## 2015-04-25 DIAGNOSIS — I87312 Chronic venous hypertension (idiopathic) with ulcer of left lower extremity: Secondary | ICD-10-CM | POA: Diagnosis not present

## 2015-04-27 ENCOUNTER — Other Ambulatory Visit: Payer: Self-pay | Admitting: *Deleted

## 2015-04-27 NOTE — Patient Outreach (Signed)
Gavin [name]) Care Management   04/27/2015  Gavin [name] 1929/09/14 [id number]  Gavin [name] is an 81 y.o. male  Subjective: Mr.Aldana reports that he did not sleep well on last night.He discussed his recent issue with wound on his left leg that was noted on my last visit and reported to his PCP and arranged same day visit. Mr.Broady reports that he is now being seen at the wound clinic twice a week and [place] home health nurse changes the dressing twice a week and his wife changes the dressings on the other days. He reports that his pain is relieved by cool being applied along with taking prescribed pain medication.  Objective:   Review of Systems  Constitutional: Negative.   HENT: Negative.   Respiratory: Negative.   Cardiovascular: Negative.   Gastrointestinal: Negative.   Genitourinary: Negative.   Skin: Negative.   Neurological: Negative.   Psychiatric/Behavioral: Negative.     Physical Exam  Constitutional: He appears well-developed and well-nourished.  Cardiovascular: Normal rate and regular rhythm.   Respiratory: Breath sounds normal.  Skin: Skin is warm and dry.     Allevyn dressing intact to left lower leg Bilateral lower extremity edema 1+ Bilateral lower extremities light brownish skin discoloration  Psychiatric: He has a normal mood and affect.   BP 130/74 mmHg  Pulse 64  Resp 18  SpO2 93% Current Medications:   Current Outpatient Prescriptions  Medication Sig Dispense Refill  . acetaminophen-codeine (TYLENOL #3) 300-30 MG per tablet Take 2 tablets by mouth every 4 (four) hours as needed for moderate pain.    Marland Kitchen amLODipine (NORVASC) 2.5 MG tablet Take 1 tablet (2.5 mg total) by mouth daily. 30 tablet 11  . amoxicillin-clavulanate (AUGMENTIN) 500-125 MG per tablet Take 1 tablet by mouth 3 (three) times daily. Until completed    . aspirin EC 81 MG tablet Take 81 mg by mouth at bedtime.     Marland Kitchen atorvastatin (LIPITOR) 40 MG tablet Take 1 tablet  (40 mg total) by mouth at bedtime. 31 tablet 11  . Cholecalciferol (VITAMIN D) 2000 UNITS CAPS Take 2,000 Units by mouth daily before supper.    . clopidogrel (PLAVIX) 75 MG tablet Take 1 tablet (75 mg total) by mouth daily. 30 tablet 11  . diclofenac sodium (VOLTAREN) 1 % GEL Apply 2 g topically daily as needed (pain).    . DULoxetine (CYMBALTA) 60 MG capsule Take 60 mg by mouth daily.  0  . fexofenadine (ALLEGRA) 180 MG tablet Take 180 mg by mouth daily.    . furosemide (LASIX) 40 MG tablet Take 40 mg by mouth daily. Take 1/2 tablet daily  0  . ibuprofen (ADVIL,MOTRIN) 200 MG tablet Take 200 mg by mouth daily as needed for headache.    . isosorbide mononitrate (IMDUR) 30 MG 24 hr tablet Take 30 mg by mouth 2 (two) times daily.   0  . Ketotifen Fumarate (ALAWAY OP) Place 1 drop into both eyes 2 (two) times daily as needed (dry eyes).    . Menthol, Topical Analgesic, (BIOFREEZE EX) Apply 1 application topically daily as needed (pain).    Marland Kitchen metolazone (ZAROXOLYN) 2.5 MG tablet Take 2.5 mg by mouth daily. Every other day as needed    . metoprolol (LOPRESSOR) 50 MG tablet take 1 tablet by mouth twice a day 30 tablet 4  . mirtazapine (REMERON) 30 MG tablet Take 30 mg by mouth daily before supper.   0  . Misc Natural Products (LUTEIN VISION  BLEND PO) Take 1 tablet by mouth 2 (two) times daily.    . Multiple Vitamin (MULTIVITAMIN) tablet Take 1 tablet by mouth daily.    . Multiple Vitamins-Minerals (PRESERVISION AREDS PO) Take 1 tablet by mouth 2 (two) times daily.    Marland Kitchen NAMZARIC 28-10 MG CP24 Take 1 capsule by mouth daily.  0  . nitroGLYCERIN (NITROSTAT) 0.4 MG SL tablet Place 0.4 mg under the tongue every 5 (five) minutes as needed for chest pain.    . pantoprazole (PROTONIX) 40 MG tablet take 1 tablet by mouth once daily 30 tablet 6  . QUEtiapine (SEROQUEL) 50 MG tablet Take 50 mg by mouth daily before supper.   1  . RANEXA 500 MG 12 hr tablet take 1 tablet by mouth twice a day 60 tablet 6  .  tamsulosin (FLOMAX) 0.4 MG CAPS capsule Take 0.4 mg by mouth daily after supper.    . vitamin B-12 (CYANOCOBALAMIN) 1000 MCG tablet Take 1,000 mcg by mouth daily before supper.     Marland Kitchen albuterol (PROVENTIL HFA;VENTOLIN HFA) 108 (90 BASE) MCG/ACT inhaler Inhale into the lungs every 6 (six) hours as needed for wheezing or shortness of breath. ProAir    . cetirizine (ZYRTEC) 10 MG tablet Take 10 mg by mouth daily.    . mometasone (NASONEX) 50 MCG/ACT nasal spray Place 1 spray into both nostrils at bedtime.     Marland Kitchen NASONEX 50 MCG/ACT nasal spray Place 2 sprays into the nose 2 (two) times daily.     . sertraline (ZOLOFT) 50 MG tablet Take 50 mg by mouth every morning.     No current facility-administered medications for this visit.    Functional Status:   In your present state of health, do you have any difficulty performing the following activities: 04/27/2015 02/13/2015  Hearing? N -  Vision? N -  Difficulty concentrating or making decisions? Y -  Walking or climbing stairs? Y (No Data)  Dressing or bathing? N -  Doing errands, shopping? N Y  Conservation officer, nature and eating ? N -  Using the Toilet? N -  In the past six months, have you accidently leaked urine? Y -  Do you have problems with loss of bowel control? N -  Managing your Medications? N -  Managing your Finances? N -  Housekeeping or managing your Housekeeping? N -    Fall/Depression Screening:    PHQ 2/9 Scores 03/28/2015 02/03/2015  PHQ - 2 Score 0 0    Assessment:   Mr.Schexnider has completed his outpatient physical therapy and was looking forward to being able to participate in the wellness, cardiac rehab program, but states the wound center MD told them to limit activity, and to keep legs elevated most of the day. I have discussed with Mrs. Hinderman to ask the MD on next visit when he can return to his walking routine that he was doing in the home, she has written this in [facility] calendar and will take it with her to the next appointment this  week. Mrs. Swiderski reports that she is able to change dressing as instructed by the home health nurse without problems. Mr.Deleonardis states that he normally wears supportive knee stocking, but they are off for the visit today.  Hypertension: Mrs. Dykman continues to record daily weights and blood pressures, I reviewed record no increases in weight noted, his blood pressure is staying within the parameters given by Dr.Scott. They continue to monitor his diet and limit salt content in foods.  Falls : No falls since last visit, patient continues to use his rolling walker at all times.  Plan:  Will schedule  telephone call in 2 weeks to follow up on progress. August 18 at 12 noon.  [facility] CM Care Plan Problem One        Patient Outreach from 04/27/2015 in [place] Problem One  [place] for Problem One  Active   THN Long Term Goal (31-90 days)  Patient will not experience a fall within the next 31 days   THN Long Term Goal Start Date  02/13/15   [name] Long Term Goal Met Date  03/28/15   THN CM Short Term Goal #1 (0-30 days)  Patient will use his walker at al times when ambulating for the next 30 days   THN CM Short Term Goal #1 Start Date  02/13/15   [name] CM Short Term Goal #1 Met Date  03/28/15   THN CM Short Term Goal #2 (0-30 days)  Patient will walk for al least 10 minutes a day, 3 days a week, within the next 30 days    THN CM Short Term Goal #2 Start Date  02/13/15   [facility] CM Short Term Goal #2 Met Date  03/28/15    [facility] CM Care Plan Problem Two        Patient Outreach from 04/27/2015 in [place] Problem Two  Self Care Management of Hypertension   Care Plan for Problem Two  Active   THN Long Term Goal (31-90) days  Patient and wife will verbalize understanding Hypertension self care monitoring importance   THN Long Term Goal Start Date  02/13/15   THN Long Term Goal Met Date  03/28/15   THN CM Short Term Goal #1 (0-30 days)  Patient will  have his blood  pressure checked and recorded by his wife at least 3 days per week within the next 30 days    THN CM Short Term Goal #1 Start Date  02/13/15   [facility] CM Short Term Goal #1 Met Date   03/28/15   Interventions for Short Term Goal #2   -- [discussed to continue daily weights]   THN CM Short Term Goal #2 (0-30 days)  Patient will report weighing self daily and recording within the next 30 days. [discussed to continue]   [facility] CM Short Term Goal #2 Start Date  02/13/15   [facility] CM Short Term Goal #2 Met Date  03/28/15    [facility] CM Care Plan Problem Three        Patient Outreach from 04/27/2015 in [place] Problem Three  Wound Left Leg   Care Plan for Problem Three  Active   THN Long Term Goal (31-90) days  Patient will experience wound healing in the next 31 days   THN Long Term Goal Start Date  03/28/15   Interventions for Problem Three Long Term Goal  Discussed importance of balanced meals, protein, adequate fluid intact for healing   THN CM Short Term Goal #1 (0-30 days)  Patient will verbalize and demostrate importance of MD follow up on  Wounds   THN CM Short Term Goal #1 Start Date  03/28/15   Interventions for Short Term Goal #1  Reviewed to notify [facility] RN,HHRN or wound center for noted changes to wound   THN CM Short Term Goal #2 (0-30 days)  Patient  will keep legs elevated during the day while sitting in his chair as much as tolerated   THN CM Short Term Goal #2 Start Date  04/27/15   Interventions for Short Term Goal #2  Discussed leg elevation help with reducing swelling, helps blood flow in the legs     Joylene Draft, [place], [place] Care Management [phone]

## 2015-04-28 DIAGNOSIS — M1991 Primary osteoarthritis, unspecified site: Secondary | ICD-10-CM | POA: Diagnosis not present

## 2015-04-28 DIAGNOSIS — F0390 Unspecified dementia without behavioral disturbance: Secondary | ICD-10-CM | POA: Diagnosis not present

## 2015-04-28 DIAGNOSIS — L97829 Non-pressure chronic ulcer of other part of left lower leg with unspecified severity: Secondary | ICD-10-CM | POA: Diagnosis not present

## 2015-04-28 DIAGNOSIS — I509 Heart failure, unspecified: Secondary | ICD-10-CM | POA: Diagnosis not present

## 2015-04-28 DIAGNOSIS — I10 Essential (primary) hypertension: Secondary | ICD-10-CM | POA: Diagnosis not present

## 2015-04-28 DIAGNOSIS — I4891 Unspecified atrial fibrillation: Secondary | ICD-10-CM | POA: Diagnosis not present

## 2015-05-01 DIAGNOSIS — F0390 Unspecified dementia without behavioral disturbance: Secondary | ICD-10-CM | POA: Diagnosis not present

## 2015-05-01 DIAGNOSIS — I10 Essential (primary) hypertension: Secondary | ICD-10-CM | POA: Diagnosis not present

## 2015-05-01 DIAGNOSIS — I4891 Unspecified atrial fibrillation: Secondary | ICD-10-CM | POA: Diagnosis not present

## 2015-05-01 DIAGNOSIS — I509 Heart failure, unspecified: Secondary | ICD-10-CM | POA: Diagnosis not present

## 2015-05-01 DIAGNOSIS — L97829 Non-pressure chronic ulcer of other part of left lower leg with unspecified severity: Secondary | ICD-10-CM | POA: Diagnosis not present

## 2015-05-01 DIAGNOSIS — M1991 Primary osteoarthritis, unspecified site: Secondary | ICD-10-CM | POA: Diagnosis not present

## 2015-05-02 DIAGNOSIS — L97829 Non-pressure chronic ulcer of other part of left lower leg with unspecified severity: Secondary | ICD-10-CM | POA: Diagnosis not present

## 2015-05-02 DIAGNOSIS — I10 Essential (primary) hypertension: Secondary | ICD-10-CM | POA: Diagnosis not present

## 2015-05-02 DIAGNOSIS — Z955 Presence of coronary angioplasty implant and graft: Secondary | ICD-10-CM | POA: Diagnosis not present

## 2015-05-02 DIAGNOSIS — I2510 Atherosclerotic heart disease of native coronary artery without angina pectoris: Secondary | ICD-10-CM | POA: Diagnosis not present

## 2015-05-02 DIAGNOSIS — I509 Heart failure, unspecified: Secondary | ICD-10-CM | POA: Diagnosis not present

## 2015-05-02 DIAGNOSIS — K219 Gastro-esophageal reflux disease without esophagitis: Secondary | ICD-10-CM | POA: Diagnosis not present

## 2015-05-02 DIAGNOSIS — R079 Chest pain, unspecified: Secondary | ICD-10-CM | POA: Diagnosis not present

## 2015-05-02 DIAGNOSIS — G309 Alzheimer's disease, unspecified: Secondary | ICD-10-CM | POA: Diagnosis not present

## 2015-05-02 DIAGNOSIS — F0280 Dementia in other diseases classified elsewhere without behavioral disturbance: Secondary | ICD-10-CM | POA: Diagnosis not present

## 2015-05-02 DIAGNOSIS — I872 Venous insufficiency (chronic) (peripheral): Secondary | ICD-10-CM | POA: Diagnosis not present

## 2015-05-02 DIAGNOSIS — Z7982 Long term (current) use of aspirin: Secondary | ICD-10-CM | POA: Diagnosis not present

## 2015-05-02 DIAGNOSIS — I25119 Atherosclerotic heart disease of native coronary artery with unspecified angina pectoris: Secondary | ICD-10-CM | POA: Diagnosis not present

## 2015-05-02 DIAGNOSIS — R072 Precordial pain: Secondary | ICD-10-CM | POA: Diagnosis not present

## 2015-05-02 DIAGNOSIS — Z9981 Dependence on supplemental oxygen: Secondary | ICD-10-CM | POA: Diagnosis not present

## 2015-05-02 DIAGNOSIS — L97322 Non-pressure chronic ulcer of left ankle with fat layer exposed: Secondary | ICD-10-CM | POA: Diagnosis not present

## 2015-05-02 DIAGNOSIS — Z79899 Other long term (current) drug therapy: Secondary | ICD-10-CM | POA: Diagnosis not present

## 2015-05-02 DIAGNOSIS — E785 Hyperlipidemia, unspecified: Secondary | ICD-10-CM | POA: Diagnosis not present

## 2015-05-02 DIAGNOSIS — Z87891 Personal history of nicotine dependence: Secondary | ICD-10-CM | POA: Diagnosis not present

## 2015-05-02 DIAGNOSIS — I249 Acute ischemic heart disease, unspecified: Secondary | ICD-10-CM | POA: Diagnosis not present

## 2015-05-02 DIAGNOSIS — I252 Old myocardial infarction: Secondary | ICD-10-CM | POA: Diagnosis not present

## 2015-05-02 DIAGNOSIS — G4733 Obstructive sleep apnea (adult) (pediatric): Secondary | ICD-10-CM | POA: Diagnosis not present

## 2015-05-03 ENCOUNTER — Telehealth: Payer: Self-pay | Admitting: Cardiovascular Disease

## 2015-05-03 DIAGNOSIS — R609 Edema, unspecified: Secondary | ICD-10-CM | POA: Diagnosis not present

## 2015-05-03 DIAGNOSIS — E785 Hyperlipidemia, unspecified: Secondary | ICD-10-CM | POA: Diagnosis not present

## 2015-05-03 DIAGNOSIS — I2119 ST elevation (STEMI) myocardial infarction involving other coronary artery of inferior wall: Secondary | ICD-10-CM | POA: Diagnosis not present

## 2015-05-03 DIAGNOSIS — I10 Essential (primary) hypertension: Secondary | ICD-10-CM | POA: Diagnosis not present

## 2015-05-03 DIAGNOSIS — I249 Acute ischemic heart disease, unspecified: Secondary | ICD-10-CM | POA: Diagnosis not present

## 2015-05-03 DIAGNOSIS — R079 Chest pain, unspecified: Secondary | ICD-10-CM | POA: Diagnosis not present

## 2015-05-03 NOTE — Telephone Encounter (Signed)
Patient scheduled 05/17/15 with Dr. [name]

## 2015-05-03 NOTE — Telephone Encounter (Signed)
Spoke with hospitalist at [facility] - Mr. Knittle was admitted for chest pain. 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 EF 47%. This is similar to findings in the past. Chest pain free. Advised increase in Ranexa to 1000 mg BID from 500 mg BID. Will need follow-up appointment with Dr. [name] or [facility] in 1-2 weeks.  Dr. Debara Pickett

## 2015-05-04 ENCOUNTER — Other Ambulatory Visit: Payer: Self-pay | Admitting: *Deleted

## 2015-05-04 DIAGNOSIS — I509 Heart failure, unspecified: Secondary | ICD-10-CM | POA: Diagnosis not present

## 2015-05-04 DIAGNOSIS — L97829 Non-pressure chronic ulcer of other part of left lower leg with unspecified severity: Secondary | ICD-10-CM | POA: Diagnosis not present

## 2015-05-04 DIAGNOSIS — M1991 Primary osteoarthritis, unspecified site: Secondary | ICD-10-CM | POA: Diagnosis not present

## 2015-05-04 DIAGNOSIS — I4891 Unspecified atrial fibrillation: Secondary | ICD-10-CM | POA: Diagnosis not present

## 2015-05-04 DIAGNOSIS — I10 Essential (primary) hypertension: Secondary | ICD-10-CM | POA: Diagnosis not present

## 2015-05-04 DIAGNOSIS — F0390 Unspecified dementia without behavioral disturbance: Secondary | ICD-10-CM | POA: Diagnosis not present

## 2015-05-04 NOTE — Patient Outreach (Signed)
[facility]) Care Management  05/04/2015  Gavin [name] 1929/03/01 [id number]   Transition of Care Outreach call Noted in [place], Gavin [name] admitted for 1 day stay after complaint of chest pain discharged on 8/10. Spoke with Gavin [name] on today, reports that [name] also had a stress test during this admission. Gavin [name] states that he is resting at the present time, and has denied any chest pain or shortness of breath since being home. Gavin. Kerin [name] reports that she has filled his prescriptions including a new bottle of nitroglycerin that he keeps in his pocket  at all times,but states they forgot about taking it when he had chest pain. Gavin [name]  reports that he has a follow up appointment with Dr. Nicki Reaper on 8/19,Dr. [name] on 8/24. Gavin [name] is now going to the wound center weekly, has Gavin [name] weekly and Gavin [name] changes the dressing the rest of the week, she reports that she is doing okay with changing the wound. Gavin [name] weighs daily and records no reported increase in weight on today, she reports no increase in swelling since yesterday. Reviewed with the wife of Gavin [name],that  if he has chest pain how to use nitroglycerin by teach back, calling 911/MD for unrelieved chest pain.. Encouraged to continue daily weights and to notify MD/RN of weight gain greater than 3 pounds in a day or 5 pounds in one week. Plan : Will schedule home visit on August 18 at [place], [place], [place] Care Management [phone]

## 2015-05-05 DIAGNOSIS — I509 Heart failure, unspecified: Secondary | ICD-10-CM | POA: Diagnosis not present

## 2015-05-05 DIAGNOSIS — I10 Essential (primary) hypertension: Secondary | ICD-10-CM | POA: Diagnosis not present

## 2015-05-05 DIAGNOSIS — L97829 Non-pressure chronic ulcer of other part of left lower leg with unspecified severity: Secondary | ICD-10-CM | POA: Diagnosis not present

## 2015-05-05 DIAGNOSIS — I4891 Unspecified atrial fibrillation: Secondary | ICD-10-CM | POA: Diagnosis not present

## 2015-05-05 DIAGNOSIS — M1991 Primary osteoarthritis, unspecified site: Secondary | ICD-10-CM | POA: Diagnosis not present

## 2015-05-05 DIAGNOSIS — F0390 Unspecified dementia without behavioral disturbance: Secondary | ICD-10-CM | POA: Diagnosis not present

## 2015-05-08 DIAGNOSIS — I4891 Unspecified atrial fibrillation: Secondary | ICD-10-CM | POA: Diagnosis not present

## 2015-05-08 DIAGNOSIS — F0390 Unspecified dementia without behavioral disturbance: Secondary | ICD-10-CM | POA: Diagnosis not present

## 2015-05-08 DIAGNOSIS — I509 Heart failure, unspecified: Secondary | ICD-10-CM | POA: Diagnosis not present

## 2015-05-08 DIAGNOSIS — I10 Essential (primary) hypertension: Secondary | ICD-10-CM | POA: Diagnosis not present

## 2015-05-08 DIAGNOSIS — M1991 Primary osteoarthritis, unspecified site: Secondary | ICD-10-CM | POA: Diagnosis not present

## 2015-05-08 DIAGNOSIS — L97829 Non-pressure chronic ulcer of other part of left lower leg with unspecified severity: Secondary | ICD-10-CM | POA: Diagnosis not present

## 2015-05-09 DIAGNOSIS — L97829 Non-pressure chronic ulcer of other part of left lower leg with unspecified severity: Secondary | ICD-10-CM | POA: Diagnosis not present

## 2015-05-09 DIAGNOSIS — I872 Venous insufficiency (chronic) (peripheral): Secondary | ICD-10-CM | POA: Diagnosis not present

## 2015-05-09 DIAGNOSIS — L97322 Non-pressure chronic ulcer of left ankle with fat layer exposed: Secondary | ICD-10-CM | POA: Diagnosis not present

## 2015-05-10 ENCOUNTER — Other Ambulatory Visit: Payer: Self-pay | Admitting: Cardiovascular Disease

## 2015-05-10 ENCOUNTER — Ambulatory Visit (HOSPITAL_COMMUNITY)
Admission: RE | Admit: 2015-05-10 | Discharge: 2015-05-10 | Disposition: A | Discharge status: Home / Self Care | Payer: Medicare Other | Source: Ambulatory Visit | Attending: Cardiovascular Disease | Admitting: Cardiovascular Disease

## 2015-05-10 DIAGNOSIS — I739 Peripheral vascular disease, unspecified: Secondary | ICD-10-CM

## 2015-05-10 DIAGNOSIS — L97829 Non-pressure chronic ulcer of other part of left lower leg with unspecified severity: Secondary | ICD-10-CM | POA: Diagnosis not present

## 2015-05-10 DIAGNOSIS — I70203 Unspecified atherosclerosis of native arteries of extremities, bilateral legs: Secondary | ICD-10-CM | POA: Diagnosis not present

## 2015-05-10 DIAGNOSIS — D649 Anemia, unspecified: Secondary | ICD-10-CM | POA: Diagnosis not present

## 2015-05-10 DIAGNOSIS — G4700 Insomnia, unspecified: Secondary | ICD-10-CM | POA: Diagnosis not present

## 2015-05-10 DIAGNOSIS — I509 Heart failure, unspecified: Secondary | ICD-10-CM | POA: Diagnosis not present

## 2015-05-10 DIAGNOSIS — I779 Disorder of arteries and arterioles, unspecified: Secondary | ICD-10-CM

## 2015-05-10 DIAGNOSIS — R609 Edema, unspecified: Secondary | ICD-10-CM | POA: Diagnosis not present

## 2015-05-10 DIAGNOSIS — G4733 Obstructive sleep apnea (adult) (pediatric): Secondary | ICD-10-CM | POA: Diagnosis not present

## 2015-05-11 ENCOUNTER — Ambulatory Visit: Payer: Medicare Other | Admitting: *Deleted

## 2015-05-11 ENCOUNTER — Other Ambulatory Visit: Payer: Self-pay | Admitting: *Deleted

## 2015-05-11 DIAGNOSIS — I10 Essential (primary) hypertension: Secondary | ICD-10-CM | POA: Diagnosis not present

## 2015-05-11 DIAGNOSIS — F0390 Unspecified dementia without behavioral disturbance: Secondary | ICD-10-CM | POA: Diagnosis not present

## 2015-05-11 DIAGNOSIS — I509 Heart failure, unspecified: Secondary | ICD-10-CM | POA: Diagnosis not present

## 2015-05-11 DIAGNOSIS — I87332 Chronic venous hypertension (idiopathic) with ulcer and inflammation of left lower extremity: Secondary | ICD-10-CM | POA: Diagnosis not present

## 2015-05-11 DIAGNOSIS — M1991 Primary osteoarthritis, unspecified site: Secondary | ICD-10-CM | POA: Diagnosis not present

## 2015-05-11 DIAGNOSIS — I4891 Unspecified atrial fibrillation: Secondary | ICD-10-CM | POA: Diagnosis not present

## 2015-05-11 DIAGNOSIS — L97829 Non-pressure chronic ulcer of other part of left lower leg with unspecified severity: Secondary | ICD-10-CM | POA: Diagnosis not present

## 2015-05-11 NOTE — Patient Outreach (Addendum)
[facility]) Care Management   05/11/2015  Gavin [name] 05-02-1929 [id number]  Gavin [name] is an 81 y.o. male  Subjective: Gavin [name] reports pain at left lower leg wound, denies chest pain or shortness of breath. Reports that he had vascular studies 8/17 in [place] .Mr. Gavin [name] reports he continues with dressing changes per wound care nurse 2 days per week, and Dr.Hendandez at the wound center one day per week, last seen 8/16. Gavin [name] states that the pain at the wound site has increased on this week, states "seems like sometimes the tylenol with codeine doesn't work, and at other times it does along with using a cool pack near the area.Mrs.Gavin [name] reports that she made an  appointment with a  general surgeon  and had an office visit  earlier today regarding Gavin [name]'s leg wound ,Dr.Morehead in [place]. Mrs.Gavin [name] discussed that it is difficult watching her husband  be uncomfortable.  Objective:   Review of Systems  Constitutional: Negative.   HENT: Negative.   Eyes: Negative.   Respiratory: Negative.   Cardiovascular: Negative for chest pain.  Gastrointestinal: Negative.   Genitourinary: Negative.   Musculoskeletal: Negative for falls.  Skin:       Left lower leg wound  Neurological: Negative.   Psychiatric/Behavioral: Negative.     Physical Exam  Constitutional: He is oriented to person, place, and time. Vital signs are normal. He appears well-developed and well-nourished.  Cardiovascular: Normal rate and regular rhythm.   Respiratory: Effort normal and breath sounds normal.  GI: Soft.  Musculoskeletal: He exhibits edema.  Bilateral lower extremity edema, Left greater than right  Neurological: He is alert and oriented to person, place, and time.  Skin: Skin is warm and dry. There is erythema.  Dressing to left lower leg wound, redness and edema noted to  left  lower leg above dressing site.  Psychiatric: He has a normal mood and affect.   BP 138/80 mmHg   Pulse 84  Resp 20  SpO2 93% Current Medications:   Current Outpatient Prescriptions  Medication Sig Dispense Refill  . acetaminophen-codeine (TYLENOL #3) 300-30 MG per tablet Take 2 tablets by mouth every 4 (four) hours as needed for moderate pain.    Marland Kitchen albuterol (PROVENTIL HFA;VENTOLIN HFA) 108 (90 BASE) MCG/ACT inhaler Inhale into the lungs every 6 (six) hours as needed for wheezing or shortness of breath. ProAir    . amLODipine (NORVASC) 2.5 MG tablet Take 1 tablet (2.5 mg total) by mouth daily. 30 tablet 11  . amoxicillin-clavulanate (AUGMENTIN) 500-125 MG per tablet Take 1 tablet by mouth 3 (three) times daily. Until completed    . aspirin EC 81 MG tablet Take 81 mg by mouth at bedtime.     Marland Kitchen atorvastatin (LIPITOR) 40 MG tablet Take 1 tablet (40 mg total) by mouth at bedtime. 31 tablet 11  . cetirizine (ZYRTEC) 10 MG tablet Take 10 mg by mouth daily.    . Cholecalciferol (VITAMIN D) 2000 UNITS CAPS Take 2,000 Units by mouth daily before supper.    . clopidogrel (PLAVIX) 75 MG tablet Take 1 tablet (75 mg total) by mouth daily. 30 tablet 11  . diclofenac sodium (VOLTAREN) 1 % GEL Apply 2 g topically daily as needed (pain).    . DULoxetine (CYMBALTA) 60 MG capsule Take 60 mg by mouth daily.  0  . fexofenadine (ALLEGRA) 180 MG tablet Take 180 mg by mouth daily.    . furosemide (LASIX) 40 MG tablet Take 40 mg by mouth daily.  Take 1/2 tablet daily  0  . ibuprofen (ADVIL,MOTRIN) 200 MG tablet Take 200 mg by mouth daily as needed for headache.    . isosorbide mononitrate (IMDUR) 30 MG 24 hr tablet Take 30 mg by mouth 2 (two) times daily.   0  . Ketotifen Fumarate (ALAWAY OP) Place 1 drop into both eyes 2 (two) times daily as needed (dry eyes).    . Menthol, Topical Analgesic, (BIOFREEZE EX) Apply 1 application topically daily as needed (pain).    Marland Kitchen metolazone (ZAROXOLYN) 2.5 MG tablet Take 2.5 mg by mouth daily. Every other day as needed    . metoprolol (LOPRESSOR) 50 MG tablet take 1 tablet by  mouth twice a day 30 tablet 4  . mirtazapine (REMERON) 30 MG tablet Take 30 mg by mouth daily before supper.   0  . Misc Natural Products (LUTEIN VISION BLEND PO) Take 1 tablet by mouth 2 (two) times daily.    . mometasone (NASONEX) 50 MCG/ACT nasal spray Place 1 spray into both nostrils at bedtime.     . Multiple Vitamin (MULTIVITAMIN) tablet Take 1 tablet by mouth daily.    . Multiple Vitamins-Minerals (PRESERVISION AREDS PO) Take 1 tablet by mouth 2 (two) times daily.    Marland Kitchen NAMZARIC 28-10 MG CP24 Take 1 capsule by mouth daily.  0  . NASONEX 50 MCG/ACT nasal spray Place 2 sprays into the nose 2 (two) times daily.     . nitroGLYCERIN (NITROSTAT) 0.4 MG SL tablet Place 0.4 mg under the tongue every 5 (five) minutes as needed for chest pain.    . pantoprazole (PROTONIX) 40 MG tablet take 1 tablet by mouth once daily 30 tablet 6  . QUEtiapine (SEROQUEL) 50 MG tablet Take 50 mg by mouth daily before supper.   1  . RANEXA 500 MG 12 hr tablet take 1 tablet by mouth twice a day 60 tablet 6  . sertraline (ZOLOFT) 50 MG tablet Take 50 mg by mouth every morning.    . tamsulosin (FLOMAX) 0.4 MG CAPS capsule Take 0.4 mg by mouth daily after supper.    . vitamin B-12 (CYANOCOBALAMIN) 1000 MCG tablet Take 1,000 mcg by mouth daily before supper.      No current facility-administered medications for this visit.    Functional Status:   In your present state of health, do you have any difficulty performing the following activities: 04/27/2015 02/13/2015  Hearing? N -  Vision? N -  Difficulty concentrating or making decisions? Y -  Walking or climbing stairs? Y (No Data)  Dressing or bathing? N -  Doing errands, shopping? N Y  Conservation officer, nature and eating ? N -  Using the Toilet? N -  In the past six months, have you accidently leaked urine? Y -  Do you have problems with loss of bowel control? N -  Managing your Medications? N -  Managing your Finances? N -  Housekeeping or managing your Housekeeping? N -     Fall/Depression Screening:    PHQ 2/9 Scores 03/28/2015 02/03/2015  PHQ - 2 Score 0 0    Assessment:  Gavin [name] reports increased pain at the wound site, Mrs.Taulbee reports that he takes tylenol with codeine, not always every 4 hours as needed as prescribed.Mrs.Swiney asked if there are other pain medications that he can use.   Mrs.Naji continues with recording daily weights, today's weight is 195, no increase in weight. Gavin [name] continues to keep his nitroglycerin in his pocket at all times, and is  able to state how to use nitroglycerin if he has chest pain.Encouraged Gavin [name] to continue to take all  medication as prescribed ,Mrs.Whitesel was able to pick up  new antibiotic prescription from the drug store today, ordered by Dr.Hernandez for possible cellulitis as noted in the  medical record, encouraged to begin antibiotic today. Discussed the importance of following the  MD's plan of care, as Mrs.Mirando initiated the surgeon appointment and states she will let Dr.Scott know at the office visit on tomorrow. Mrs.Barretta has requested information on Heart Failure, reviewed handouts,will continue education at another visit   Plan:  .Notify Dr.Hernandez regarding increased pain at wound site, and asked if there a different pain medication per Mr.&Mrs.Aldaz request that he can take, I received a return telephone call from Cooper Render from the office stating that Dr.Hernandez can only order Tylenol with codeine, and suggested that we call his Primary Care office.  .Place call to [place] office, telephone call to [place] office I was able to speak with nurse Caryl Pina, discussed with her concern regarding his lack of  pain control on current tylenol with codeine. No changes to medication suggested or to be prescribed at this time, Gavin [name] has a scheduled office visit on tomorrow and they will address this when seen in the office, encouraged him to take tylenol with codeine as prescribed, telephone call to  Mrs.Mosley to notify her of the communication with the MD office. Will notify Dr.Scott's office of [place] attending the  general surgeon appointment- able to leave a message on christy triage nurse line.  .Schedule follow up transition of care call on 8/25 or visit as needed.  [facility] CM Care Plan Problem One        Patient Outreach from 05/11/2015 in [place] Problem One  [place] for Problem One  Active   THN Long Term Goal (31-90 days)  Patient will not experience a fall within the next 31 days   THN Long Term Goal Start Date  02/13/15   [facility] Long Term Goal Met Date  03/28/15   THN CM Short Term Goal #1 (0-30 days)  Patient will use his walker at al times when ambulating for the next 30 days   THN CM Short Term Goal #1 Start Date  02/13/15   [facility] CM Short Term Goal #1 Met Date  03/28/15   THN CM Short Term Goal #2 (0-30 days)  Patient will walk for al least 10 minutes a day, 3 days a week, within the next 30 days    THN CM Short Term Goal #2 Start Date  02/13/15   [facility] CM Short Term Goal #2 Met Date  03/28/15    [facility] CM Care Plan Problem Two        Patient Outreach from 05/11/2015 in [place] Problem Two  Self Care Management of Hypertension   Care Plan for Problem Two  Active   THN Long Term Goal (31-90) days  Patient and wife will verbalize understanding Hypertension self care monitoring importance   THN Long Term Goal Start Date  02/13/15   THN Long Term Goal Met Date  03/28/15   THN CM Short Term Goal #1 (0-30 days)  Patient will have his blood  pressure checked and recorded by his wife at least 3 days per week within the next 30 days    THN CM Short Term Goal #1 Start Date  02/13/15  THN CM Short Term Goal #1 Met Date   03/28/15   Interventions for Short Term Goal #2   -- [discussed to continue daily weights]   THN CM Short Term Goal #2 (0-30 days)  Patient will report weighing self daily and recording within the next  30 days. [discussed to continue]   [facility] CM Short Term Goal #2 Start Date  02/13/15   [facility] CM Short Term Goal #2 Met Date  03/28/15    [facility] CM Care Plan Problem Three        Patient Outreach from 05/11/2015 in [place] Problem Three  Wound Left Leg   Care Plan for Problem Three  Active   THN Long Term Goal (31-90) days  Patient will experience wound healing in the next 31 days   THN Long Term Goal Start Date  05/04/15 [goal date restarted]   Interventions for Problem Three Long Term Goal  Discussed importance of balanced meals, protein, adequate fluid intact for healing   THN CM Short Term Goal #1 (0-30 days)  Patient will verbalize and demostrate importance of MD follow up on  Wounds   THN CM Short Term Goal #1 Start Date  03/28/15   [facility] CM Short Term Goal #1 Met Date  05/04/15   Interventions for Short Term Goal #1  Reviewed to notify [facility] RN,HHRN or wound center for noted changes to wound , temperature or drainage   THN CM Short Term Goal #2 (0-30 days)  Patient will keep legs elevated during the day while sitting in his chair as much as tolerated   THN CM Short Term Goal #2 Start Date  04/27/15   [facility] CM Short Term Goal #2 Met Date  05/11/15   THN CM Short Term Goal #3 (0-30 days)  Patient will state improved pain control   THN  CM Short Term Goal #3 Start Date  05/11/15   Interventions for Short Term Goal #3  explore pain control interventions to help, cool pack near area,take prn pain medication as ordered, call MD for adjustment in medication, or any increase of different pain      Joylene Draft, [place], [place] Management [phone]

## 2015-05-12 ENCOUNTER — Encounter: Payer: Self-pay | Admitting: *Deleted

## 2015-05-12 DIAGNOSIS — I2510 Atherosclerotic heart disease of native coronary artery without angina pectoris: Secondary | ICD-10-CM | POA: Diagnosis not present

## 2015-05-12 DIAGNOSIS — S81802D Unspecified open wound, left lower leg, subsequent encounter: Secondary | ICD-10-CM | POA: Diagnosis not present

## 2015-05-15 ENCOUNTER — Telehealth: Payer: Self-pay | Admitting: Cardiovascular Disease

## 2015-05-15 ENCOUNTER — Encounter (HOSPITAL_COMMUNITY): Payer: Medicare Other

## 2015-05-15 DIAGNOSIS — F0390 Unspecified dementia without behavioral disturbance: Secondary | ICD-10-CM | POA: Diagnosis not present

## 2015-05-15 DIAGNOSIS — M1991 Primary osteoarthritis, unspecified site: Secondary | ICD-10-CM | POA: Diagnosis not present

## 2015-05-15 DIAGNOSIS — I4891 Unspecified atrial fibrillation: Secondary | ICD-10-CM | POA: Diagnosis not present

## 2015-05-15 DIAGNOSIS — I10 Essential (primary) hypertension: Secondary | ICD-10-CM | POA: Diagnosis not present

## 2015-05-15 DIAGNOSIS — L97829 Non-pressure chronic ulcer of other part of left lower leg with unspecified severity: Secondary | ICD-10-CM | POA: Diagnosis not present

## 2015-05-15 DIAGNOSIS — I509 Heart failure, unspecified: Secondary | ICD-10-CM | POA: Diagnosis not present

## 2015-05-15 NOTE — Telephone Encounter (Signed)
Received records from [place] for appointment with Dr [name] on 05/17/15.  Records given to [facility] (medical records) for Dr Kennon Holter schedule on 05/17/15. lp

## 2015-05-17 ENCOUNTER — Ambulatory Visit (INDEPENDENT_AMBULATORY_CARE_PROVIDER_SITE_OTHER): Payer: Medicare Other | Admitting: Cardiovascular Disease

## 2015-05-17 ENCOUNTER — Encounter: Payer: Self-pay | Admitting: Cardiovascular Disease

## 2015-05-17 VITALS — BP 158/74 | HR 83 | Ht 67.0 in | Wt 193.0 lb

## 2015-05-17 DIAGNOSIS — Z79899 Other long term (current) drug therapy: Secondary | ICD-10-CM

## 2015-05-17 DIAGNOSIS — I10 Essential (primary) hypertension: Secondary | ICD-10-CM | POA: Diagnosis not present

## 2015-05-17 DIAGNOSIS — R5383 Other fatigue: Secondary | ICD-10-CM

## 2015-05-17 DIAGNOSIS — F0390 Unspecified dementia without behavioral disturbance: Secondary | ICD-10-CM | POA: Diagnosis not present

## 2015-05-17 DIAGNOSIS — I739 Peripheral vascular disease, unspecified: Secondary | ICD-10-CM | POA: Diagnosis not present

## 2015-05-17 DIAGNOSIS — I4891 Unspecified atrial fibrillation: Secondary | ICD-10-CM | POA: Diagnosis not present

## 2015-05-17 DIAGNOSIS — I779 Disorder of arteries and arterioles, unspecified: Secondary | ICD-10-CM | POA: Diagnosis not present

## 2015-05-17 DIAGNOSIS — I2510 Atherosclerotic heart disease of native coronary artery without angina pectoris: Secondary | ICD-10-CM | POA: Diagnosis not present

## 2015-05-17 DIAGNOSIS — D689 Coagulation defect, unspecified: Secondary | ICD-10-CM

## 2015-05-17 DIAGNOSIS — L97829 Non-pressure chronic ulcer of other part of left lower leg with unspecified severity: Secondary | ICD-10-CM | POA: Diagnosis not present

## 2015-05-17 DIAGNOSIS — M1991 Primary osteoarthritis, unspecified site: Secondary | ICD-10-CM | POA: Diagnosis not present

## 2015-05-17 DIAGNOSIS — I509 Heart failure, unspecified: Secondary | ICD-10-CM | POA: Diagnosis not present

## 2015-05-17 NOTE — Progress Notes (Signed)
05/17/2015 Gavin [name]   1929/08/30  [id number]  Primary Physician Ann Held, MD Primary Cardiologist: [name] [name]   HPI:  74-old white married male with a history of coronary disease with last cardiac catheterization July 2010 revealing an occluded LAD stent otherwise normal coronary arteries. At that time his EF was 30%, subsequent 2-D echo in 2013 revealed EF is normal. He had a stress test in 2012 with mod to severe perfusion defect, maybe slightly worse than previous study.At that time the pt was without symptoms so no further work up was done. He also has a history of PAF and is on Coumadin for anticoagulation. He has been maintaining sinus rhythm. He has peripheral vascular disease with stenting by Dr. [name] to both SFAs in 2004 with 0 vessel runoff on the right and one on the left via peroneal. he also has bilateral carotid disease left greater than right. Other problems include hypertension hyperlipidemia, and obstructive sleep apnea on CPAP. He has chronic headache with neg. head CT in June, and recently per our office stopped his IMDUR to see if Headache would resolve. It did not. He resumed the Imdur 05/29/13. He was admitted 05/29/13 with recurrent chest pain worrisome for unstable angina. His Coumadin was held. His Troponin was negative. He underwent coronary angiogram 06/01/13. This revealed moderate RCA, ostial CFX, and distal Dx1 disease with know distal LAD occlusion. There was no obvious culprit lesion and the plan is for medical Rx. Norvasc and Ranexa were added and his Imdur was decreased.he was seen by [name] [facility] in the office 06/08/13 for post consultation followup. . I last saw him in the office earlier this year. He has paroxysmal atrial fibrillation on Coumadin anticoagulation. He has fallen several times one involving head trauma.  Since I saw him in the office one year ago he was admitted back in February for right shoulder pain which  ended up being a non-STEMI. He was catheterized by Dr. Burt Knack revealing 80% mid dominant RCA stenosis which was stented using a drug-eluting stent. He is placed on Brilenta. Since that time he noticed increasing shortness of breath. Since I saw him in the office 2 months ago he developed a nonhealing wound on the medial aspect of his left calf. Dopplers revealed decrease in his ABI from 0.86 on the left with a patent SFA to non-measurable because of pain and an occluded left SFA. I believe he has critical limb ischemia we'll arrange for him to undergo angiography potential intervention. I did stop his Brilenta because of shortness of breath and transition him to Plavix which improved this problem.   Current Outpatient Prescriptions  Medication Sig Dispense Refill  . acetaminophen-codeine (TYLENOL #3) 300-30 MG per tablet Take 2 tablets by mouth every 4 (four) hours as needed for moderate pain.    Marland Kitchen albuterol (PROVENTIL HFA;VENTOLIN HFA) 108 (90 BASE) MCG/ACT inhaler Inhale into the lungs every 6 (six) hours as needed for wheezing or shortness of breath. ProAir    . amLODipine (NORVASC) 2.5 MG tablet Take 1 tablet (2.5 mg total) by mouth daily. 30 tablet 11  . aspirin EC 81 MG tablet Take 81 mg by mouth at bedtime.     Marland Kitchen atorvastatin (LIPITOR) 40 MG tablet Take 1 tablet (40 mg total) by mouth at bedtime. 31 tablet 11  . Cholecalciferol (VITAMIN D) 2000 UNITS CAPS Take 2,000 Units by mouth daily before supper.    . clopidogrel (PLAVIX) 75 MG tablet  Take 1 tablet (75 mg total) by mouth daily. 30 tablet 11  . dicloxacillin (DYNAPEN) 250 MG capsule Take 250 mg by mouth 4 (four) times daily. For 10 days    . DULoxetine (CYMBALTA) 60 MG capsule Take 60 mg by mouth daily.  0  . furosemide (LASIX) 40 MG tablet Take 40 mg by mouth daily. Take 1/2 tablet daily  0  . ibuprofen (ADVIL,MOTRIN) 200 MG tablet Take 200 mg by mouth daily as needed for headache.    . isosorbide mononitrate (IMDUR) 30 MG 24 hr tablet  Take 30 mg by mouth 2 (two) times daily.   0  . Ketotifen Fumarate (ALAWAY OP) Place 1 drop into both eyes 2 (two) times daily as needed (dry eyes).    Marland Kitchen lidocaine (XYLOCAINE) 5 % ointment Apply 1 application topically daily as needed. Apply to lower leg on skin around wound, not to place in wound    . Menthol, Topical Analgesic, (BIOFREEZE EX) Apply 1 application topically daily as needed (pain).    Marland Kitchen metolazone (ZAROXOLYN) 2.5 MG tablet Take 2.5 mg by mouth daily. Every other day    . metoprolol (LOPRESSOR) 50 MG tablet take 1 tablet by mouth twice a day 30 tablet 4  . mirtazapine (REMERON) 15 MG tablet Take 15 mg by mouth at bedtime.  0  . Misc Natural Products (LUTEIN VISION BLEND PO) Take 1 tablet by mouth 2 (two) times daily.    . Multiple Vitamin (MULTIVITAMIN) tablet Take 1 tablet by mouth daily.    . Multiple Vitamins-Minerals (PRESERVISION AREDS PO) Take 1 tablet by mouth 2 (two) times daily.    Marland Kitchen NAMZARIC 28-10 MG CP24 Take 1 capsule by mouth daily.  0  . NASONEX 50 MCG/ACT nasal spray Place 2 sprays into the nose 2 (two) times daily.     . nitroGLYCERIN (NITROSTAT) 0.4 MG SL tablet Place 0.4 mg under the tongue every 5 (five) minutes as needed for chest pain.    Marland Kitchen oxyCODONE-acetaminophen (PERCOCET/ROXICET) 5-325 MG per tablet Take 1 tablet by mouth at bedtime.  0  . pantoprazole (PROTONIX) 40 MG tablet take 1 tablet by mouth once daily 30 tablet 6  . QUEtiapine (SEROQUEL) 50 MG tablet Take 50 mg by mouth daily before supper.   1  . RANEXA 1000 MG SR tablet Take 1,000 mg by mouth 2 (two) times daily.  0  . SANTYL ointment Apply 1 application topically daily.  0  . sertraline (ZOLOFT) 50 MG tablet Take 50 mg by mouth every morning.    . tamsulosin (FLOMAX) 0.4 MG CAPS capsule Take 0.4 mg by mouth daily after supper.    . vitamin B-12 (CYANOCOBALAMIN) 1000 MCG tablet Take 1,000 mcg by mouth daily before supper.      No current facility-administered medications for this visit.     Allergies  Allergen Reactions  . Ciprofloxacin     unknown  . Ciprofloxacin Other (See Comments)    Unknown allergic reaction, possible shortness of breath  . Codeine     "knocked me out"  . Codeine Other (See Comments)    "knocked me out"  . Dexamethasone     unknown  . Dexamethasone Other (See Comments)    Unknown allergic reaction  . Sulfa Antibiotics Nausea And Vomiting  . Sulfonamide Derivatives     Unknown reaction  . Telmisartan     unknown  . Telmisartan Other (See Comments)    Micardis - heart races    Social History  Social History  . Marital Status: Married    Spouse Name: N/A  . Number of Children: N/A  . Years of Education: N/A   Occupational History  . 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 Smoking status: Former Smoker    Types: Cigarettes    Quit date: 09/23/1973  . Smokeless tobacco: Never Used     Comment: tried snuff and chew as a child (23yr) did not continue  . Alcohol Use: No     Comment: seldom-- every three or four years  . Drug Use: No  . Sexual Activity: No   Other Topics Concern  . Not on file   Social History Narrative   ** Merged History Encounter **         Review of Systems: General: negative for chills, fever, night sweats or weight changes.  Cardiovascular: negative for chest pain, dyspnea on exertion, edema, orthopnea, palpitations, paroxysmal nocturnal dyspnea or shortness of breath Dermatological: negative for rash Respiratory: negative for cough or wheezing Urologic: negative for hematuria Abdominal: negative for nausea, vomiting, diarrhea, bright red blood per rectum, melena, or hematemesis Neurologic: negative for visual changes, syncope, or dizziness All other systems reviewed and are otherwise negative except as noted above.    Blood pressure 158/74, pulse 83, height 5\' 7"  (1.702 m), weight 193 lb (87.544 kg).  General appearance: alert and no distress Neck: no  adenopathy, no carotid bruit, no JVD, supple, symmetrical, trachea midline and thyroid not enlarged, symmetric, no tenderness/mass/nodules Lungs: clear to auscultation bilaterally Heart: regular rate and rhythm, S1, S2 normal, no murmur, click, rub or gallop Extremities: extremities normal, atraumatic, no cyanosis or edema  EKG sinus rhythm 83 with occasional PVCs. I personally reviewed this EKG  ASSESSMENT AND PLAN:   PAD (peripheral artery disease), history of stenting to both SFAs History of peripheral arterial disease status post bilateral SFA stenting in the past. He had Dopplers performed 04/20/14 revealed a right ABI 0.8 and a left upward 86. Prior angiograms of shown one-vessel runoff on the left. He did have hypertension signal in his distal left SFA. The last 4-6 weeks developed a wound on the medial aspect of his left calf which is being getting progressively larger and being treated at the [facility] wound care center. Recent Dopplers performed 05/10/15 revealed an occluded distal left SFA. I suspect that his wound will not heal unless he has restoration of antegrade blood flow and will I am going to set him up for intervention and potential intervention next week.      [name] FACP,FACC,FAHA, [facility] 05/17/2015 3:20 PM

## 2015-05-17 NOTE — Patient Instructions (Signed)
Dr. Berry has ordered a peripheral angiogram to be done at [facility].  This procedure is going to look at the bloodflow in your lower extremities.  If Dr. Berry is able to open up the arteries, you will have to spend one night in the hospital.  If he is not able to open the arteries, you will be able to go home that same day.    After the procedure, you will not be allowed to drive for 3 days or push, pull, or lift anything greater than 10 lbs for one week.    You will be required to have the following tests prior to the procedure:  1. Blood work-the blood work can be done no more than 7 days prior to the procedure.  It can be done at any [facility] lab.  There is one downstairs on the first floor of this building and one in the [facility] building ([address], [address])    *REPS Scott    

## 2015-05-17 NOTE — Assessment & Plan Note (Signed)
History of peripheral arterial disease status post bilateral SFA stenting in the past. He had Dopplers performed 04/20/14 revealed a right ABI 0.8 and a left upward 86. Prior angiograms of shown one-vessel runoff on the left. He did have hypertension signal in his distal left SFA. The last 4-6 weeks developed a wound on the medial aspect of his left calf which is being getting progressively larger and being treated at the aspirate wound care center. Recent Dopplers performed 05/10/15 revealed an occluded distal left SFA. I suspect that his wound will not heal unless he has restoration of antegrade blood flow and will I am going to set him up for intervention and potential intervention next week.

## 2015-05-18 ENCOUNTER — Other Ambulatory Visit: Payer: Self-pay | Admitting: *Deleted

## 2015-05-18 ENCOUNTER — Encounter: Payer: Self-pay | Admitting: Cardiovascular Disease

## 2015-05-18 DIAGNOSIS — Z01818 Encounter for other preprocedural examination: Secondary | ICD-10-CM

## 2015-05-18 LAB — BASIC METABOLIC PANEL
BUN: 23 mg/dL (ref 7–25)
CO2: 32 mmol/L — ABNORMAL HIGH (ref 20–31)
Calcium: 9 mg/dL (ref 8.6–10.3)
Chloride: 98 mmol/L (ref 98–110)
Creat: 1.39 mg/dL — ABNORMAL HIGH (ref 0.70–1.11)
Glucose, Bld: 100 mg/dL — ABNORMAL HIGH (ref 65–99)
POTASSIUM: 3.7 mmol/L (ref 3.5–5.3)
SODIUM: 143 mmol/L (ref 135–146)

## 2015-05-18 LAB — APTT: APTT: 41 s — AB (ref 24–37)

## 2015-05-18 LAB — CBC
HEMATOCRIT: 36.7 % — AB (ref 39.0–52.0)
HEMOGLOBIN: 12.6 g/dL — AB (ref 13.0–17.0)
MCH: 32.6 pg (ref 26.0–34.0)
MCHC: 34.3 g/dL (ref 30.0–36.0)
MCV: 94.8 fL (ref 78.0–100.0)
MPV: 10.5 fL (ref 8.6–12.4)
Platelets: 503 10*3/uL — ABNORMAL HIGH (ref 150–400)
RBC: 3.87 MIL/uL — ABNORMAL LOW (ref 4.22–5.81)
RDW: 15.6 % — ABNORMAL HIGH (ref 11.5–15.5)
WBC: 11.5 10*3/uL — AB (ref 4.0–10.5)

## 2015-05-18 LAB — PROTIME-INR
INR: 1.22 (ref <1.50)
PROTHROMBIN TIME: 15.4 s — AB (ref 11.6–15.2)

## 2015-05-18 LAB — TSH: TSH: 1.375 u[IU]/mL (ref 0.350–4.500)

## 2015-05-18 NOTE — Patient Outreach (Signed)
[facility]) Care Management  05/18/2015  Gavin [name] 08-07-1929 [id number]  Transition of care call  Telephone call to Gavin [name], was able to speak with his wife [name] Mrs Smithson discussed patients recent office visit to [facility], and the planned angiography procedure on Monday, August 29. Mrs.Haffey reports that patient was  "loopy" for a few days and refused to take any medication, but since his office visit to [facility]  he is now taking daily medication as ordered. She reports that he still does not sleep well during the night and she is not sure that he wore his cpap on last night. Mrs.Kassem states that his weight yesterday at the MD visit was 193, she reports that she bought new digital scales at home and that he has been hesitant to weigh on them yet.  Mrs.Colledge reports that his most recent pain medication prescription is meperidine, and that she is not giving him any tylenol #3 or oxycodone-acetaminophen and she thinks that is working a little better. Hettinger continues to provide wound care twice weekly and Mrs.Regas the other days. Encouraged Mrs.Besser to call with any further concerns,or changes in condition.  Plan: Will follow progress after his procedure and continue with transition of care  Joylene Draft, RN, [place] [facility] [phone]

## 2015-05-19 DIAGNOSIS — I4891 Unspecified atrial fibrillation: Secondary | ICD-10-CM | POA: Diagnosis not present

## 2015-05-19 DIAGNOSIS — I509 Heart failure, unspecified: Secondary | ICD-10-CM | POA: Diagnosis not present

## 2015-05-19 DIAGNOSIS — M1991 Primary osteoarthritis, unspecified site: Secondary | ICD-10-CM | POA: Diagnosis not present

## 2015-05-19 DIAGNOSIS — I10 Essential (primary) hypertension: Secondary | ICD-10-CM | POA: Diagnosis not present

## 2015-05-19 DIAGNOSIS — F0390 Unspecified dementia without behavioral disturbance: Secondary | ICD-10-CM | POA: Diagnosis not present

## 2015-05-19 DIAGNOSIS — L97829 Non-pressure chronic ulcer of other part of left lower leg with unspecified severity: Secondary | ICD-10-CM | POA: Diagnosis not present

## 2015-05-22 ENCOUNTER — Inpatient Hospital Stay (HOSPITAL_COMMUNITY)
Admission: RE | Admit: 2015-05-22 | Discharge: 2015-05-27 | DRG: 253 | Disposition: A | Discharge status: Skilled Nursing Facility | Payer: Medicare Other | Source: Ambulatory Visit | Attending: Family Medicine | Admitting: Family Medicine

## 2015-05-22 ENCOUNTER — Encounter (HOSPITAL_COMMUNITY)
Admission: RE | Disposition: A | Discharge status: Skilled Nursing Facility | Payer: Self-pay | Source: Ambulatory Visit | Attending: Cardiovascular Disease

## 2015-05-22 ENCOUNTER — Encounter (HOSPITAL_COMMUNITY): Payer: Self-pay | Admitting: Cardiovascular Disease

## 2015-05-22 DIAGNOSIS — K219 Gastro-esophageal reflux disease without esophagitis: Secondary | ICD-10-CM | POA: Diagnosis present

## 2015-05-22 DIAGNOSIS — L97229 Non-pressure chronic ulcer of left calf with unspecified severity: Secondary | ICD-10-CM | POA: Diagnosis not present

## 2015-05-22 DIAGNOSIS — Z882 Allergy status to sulfonamides status: Secondary | ICD-10-CM

## 2015-05-22 DIAGNOSIS — E785 Hyperlipidemia, unspecified: Secondary | ICD-10-CM | POA: Diagnosis present

## 2015-05-22 DIAGNOSIS — R488 Other symbolic dysfunctions: Secondary | ICD-10-CM | POA: Diagnosis not present

## 2015-05-22 DIAGNOSIS — I509 Heart failure, unspecified: Secondary | ICD-10-CM | POA: Diagnosis not present

## 2015-05-22 DIAGNOSIS — Z961 Presence of intraocular lens: Secondary | ICD-10-CM | POA: Diagnosis present

## 2015-05-22 DIAGNOSIS — Z9841 Cataract extraction status, right eye: Secondary | ICD-10-CM | POA: Diagnosis not present

## 2015-05-22 DIAGNOSIS — I10 Essential (primary) hypertension: Secondary | ICD-10-CM | POA: Diagnosis present

## 2015-05-22 DIAGNOSIS — M1990 Unspecified osteoarthritis, unspecified site: Secondary | ICD-10-CM | POA: Diagnosis not present

## 2015-05-22 DIAGNOSIS — I6523 Occlusion and stenosis of bilateral carotid arteries: Secondary | ICD-10-CM | POA: Diagnosis present

## 2015-05-22 DIAGNOSIS — E876 Hypokalemia: Secondary | ICD-10-CM | POA: Diagnosis present

## 2015-05-22 DIAGNOSIS — Z9842 Cataract extraction status, left eye: Secondary | ICD-10-CM

## 2015-05-22 DIAGNOSIS — Z888 Allergy status to other drugs, medicaments and biological substances status: Secondary | ICD-10-CM | POA: Diagnosis not present

## 2015-05-22 DIAGNOSIS — Z7951 Long term (current) use of inhaled steroids: Secondary | ICD-10-CM

## 2015-05-22 DIAGNOSIS — I998 Other disorder of circulatory system: Secondary | ICD-10-CM | POA: Diagnosis present

## 2015-05-22 DIAGNOSIS — K210 Gastro-esophageal reflux disease with esophagitis: Secondary | ICD-10-CM | POA: Diagnosis not present

## 2015-05-22 DIAGNOSIS — Z9861 Coronary angioplasty status: Secondary | ICD-10-CM

## 2015-05-22 DIAGNOSIS — F29 Unspecified psychosis not due to a substance or known physiological condition: Secondary | ICD-10-CM | POA: Diagnosis not present

## 2015-05-22 DIAGNOSIS — G4737 Central sleep apnea in conditions classified elsewhere: Secondary | ICD-10-CM | POA: Diagnosis not present

## 2015-05-22 DIAGNOSIS — Z9181 History of falling: Secondary | ICD-10-CM

## 2015-05-22 DIAGNOSIS — Z7902 Long term (current) use of antithrombotics/antiplatelets: Secondary | ICD-10-CM

## 2015-05-22 DIAGNOSIS — Z955 Presence of coronary angioplasty implant and graft: Secondary | ICD-10-CM | POA: Diagnosis not present

## 2015-05-22 DIAGNOSIS — F05 Delirium due to known physiological condition: Secondary | ICD-10-CM | POA: Diagnosis not present

## 2015-05-22 DIAGNOSIS — I70212 Atherosclerosis of native arteries of extremities with intermittent claudication, left leg: Secondary | ICD-10-CM | POA: Diagnosis not present

## 2015-05-22 DIAGNOSIS — I7092 Chronic total occlusion of artery of the extremities: Secondary | ICD-10-CM | POA: Diagnosis present

## 2015-05-22 DIAGNOSIS — Z01818 Encounter for other preprocedural examination: Secondary | ICD-10-CM

## 2015-05-22 DIAGNOSIS — H9190 Unspecified hearing loss, unspecified ear: Secondary | ICD-10-CM | POA: Diagnosis present

## 2015-05-22 DIAGNOSIS — G4733 Obstructive sleep apnea (adult) (pediatric): Secondary | ICD-10-CM | POA: Diagnosis present

## 2015-05-22 DIAGNOSIS — I480 Paroxysmal atrial fibrillation: Secondary | ICD-10-CM | POA: Diagnosis present

## 2015-05-22 DIAGNOSIS — Z87891 Personal history of nicotine dependence: Secondary | ICD-10-CM | POA: Diagnosis not present

## 2015-05-22 DIAGNOSIS — R410 Disorientation, unspecified: Secondary | ICD-10-CM | POA: Diagnosis not present

## 2015-05-22 DIAGNOSIS — Z881 Allergy status to other antibiotic agents status: Secondary | ICD-10-CM | POA: Diagnosis not present

## 2015-05-22 DIAGNOSIS — S81802A Unspecified open wound, left lower leg, initial encounter: Secondary | ICD-10-CM | POA: Diagnosis present

## 2015-05-22 DIAGNOSIS — I2510 Atherosclerotic heart disease of native coronary artery without angina pectoris: Secondary | ICD-10-CM | POA: Diagnosis present

## 2015-05-22 DIAGNOSIS — Z7982 Long term (current) use of aspirin: Secondary | ICD-10-CM | POA: Diagnosis not present

## 2015-05-22 DIAGNOSIS — F0390 Unspecified dementia without behavioral disturbance: Secondary | ICD-10-CM | POA: Diagnosis not present

## 2015-05-22 DIAGNOSIS — N390 Urinary tract infection, site not specified: Secondary | ICD-10-CM | POA: Diagnosis not present

## 2015-05-22 DIAGNOSIS — R918 Other nonspecific abnormal finding of lung field: Secondary | ICD-10-CM | POA: Diagnosis not present

## 2015-05-22 DIAGNOSIS — I252 Old myocardial infarction: Secondary | ICD-10-CM

## 2015-05-22 DIAGNOSIS — D649 Anemia, unspecified: Secondary | ICD-10-CM | POA: Diagnosis present

## 2015-05-22 DIAGNOSIS — Z95820 Peripheral vascular angioplasty status with implants and grafts: Secondary | ICD-10-CM | POA: Diagnosis not present

## 2015-05-22 DIAGNOSIS — M549 Dorsalgia, unspecified: Secondary | ICD-10-CM | POA: Diagnosis not present

## 2015-05-22 DIAGNOSIS — M6281 Muscle weakness (generalized): Secondary | ICD-10-CM | POA: Diagnosis not present

## 2015-05-22 DIAGNOSIS — R8290 Unspecified abnormal findings in urine: Secondary | ICD-10-CM | POA: Diagnosis not present

## 2015-05-22 DIAGNOSIS — I70229 Atherosclerosis of native arteries of extremities with rest pain, unspecified extremity: Secondary | ICD-10-CM | POA: Diagnosis present

## 2015-05-22 DIAGNOSIS — F329 Major depressive disorder, single episode, unspecified: Secondary | ICD-10-CM | POA: Diagnosis not present

## 2015-05-22 DIAGNOSIS — J449 Chronic obstructive pulmonary disease, unspecified: Secondary | ICD-10-CM | POA: Diagnosis present

## 2015-05-22 DIAGNOSIS — I739 Peripheral vascular disease, unspecified: Principal | ICD-10-CM | POA: Diagnosis present

## 2015-05-22 DIAGNOSIS — A419 Sepsis, unspecified organism: Secondary | ICD-10-CM | POA: Diagnosis not present

## 2015-05-22 DIAGNOSIS — J441 Chronic obstructive pulmonary disease with (acute) exacerbation: Secondary | ICD-10-CM | POA: Diagnosis not present

## 2015-05-22 DIAGNOSIS — R262 Difficulty in walking, not elsewhere classified: Secondary | ICD-10-CM | POA: Diagnosis not present

## 2015-05-22 DIAGNOSIS — F0151 Vascular dementia with behavioral disturbance: Secondary | ICD-10-CM | POA: Diagnosis not present

## 2015-05-22 DIAGNOSIS — R1310 Dysphagia, unspecified: Secondary | ICD-10-CM | POA: Diagnosis not present

## 2015-05-22 DIAGNOSIS — R1311 Dysphagia, oral phase: Secondary | ICD-10-CM | POA: Diagnosis not present

## 2015-05-22 HISTORY — PX: PERIPHERAL VASCULAR CATHETERIZATION: SHX172C

## 2015-05-22 HISTORY — DX: Headache, unspecified: R51.9

## 2015-05-22 HISTORY — DX: Headache: R51

## 2015-05-22 HISTORY — DX: Dorsalgia, unspecified: M54.9

## 2015-05-22 HISTORY — DX: Other chronic pain: G89.29

## 2015-05-22 HISTORY — DX: Acute embolism and thrombosis of unspecified deep veins of unspecified lower extremity: I82.409

## 2015-05-22 HISTORY — DX: Pneumonia, unspecified organism: J18.9

## 2015-05-22 LAB — CBC WITH DIFFERENTIAL/PLATELET
BASOS ABS: 0 10*3/uL (ref 0.0–0.1)
Basophils Relative: 0 % (ref 0–1)
Eosinophils Absolute: 0.1 10*3/uL (ref 0.0–0.7)
Eosinophils Relative: 1 % (ref 0–5)
HEMATOCRIT: 34.1 % — AB (ref 39.0–52.0)
Hemoglobin: 11.3 g/dL — ABNORMAL LOW (ref 13.0–17.0)
LYMPHS PCT: 12 % (ref 12–46)
Lymphs Abs: 1.4 10*3/uL (ref 0.7–4.0)
MCH: 31.8 pg (ref 26.0–34.0)
MCHC: 33.1 g/dL (ref 30.0–36.0)
MCV: 96.1 fL (ref 78.0–100.0)
Monocytes Absolute: 1.2 10*3/uL — ABNORMAL HIGH (ref 0.1–1.0)
Monocytes Relative: 11 % (ref 3–12)
NEUTROS ABS: 8.6 10*3/uL — AB (ref 1.7–7.7)
Neutrophils Relative %: 76 % (ref 43–77)
Platelets: 440 10*3/uL — ABNORMAL HIGH (ref 150–400)
RBC: 3.55 MIL/uL — AB (ref 4.22–5.81)
RDW: 15.4 % (ref 11.5–15.5)
WBC: 11.3 10*3/uL — AB (ref 4.0–10.5)

## 2015-05-22 LAB — GLUCOSE, CAPILLARY: Glucose-Capillary: 108 mg/dL — ABNORMAL HIGH (ref 65–99)

## 2015-05-22 LAB — POCT ACTIVATED CLOTTING TIME
ACTIVATED CLOTTING TIME: 220 s
ACTIVATED CLOTTING TIME: 183 s
Activated Clotting Time: 208 seconds
Activated Clotting Time: 245 seconds
Activated Clotting Time: 165 seconds

## 2015-05-22 SURGERY — LOWER EXTREMITY ANGIOGRAPHY
Anesthesia: LOCAL

## 2015-05-22 MED ORDER — SODIUM CHLORIDE 0.9 % IJ SOLN: 3.0000 mL | INTRAMUSCULAR | Status: DC | PRN

## 2015-05-22 MED ORDER — ACETAMINOPHEN 500 MG PO TABS
1000.0000 mg | ORAL_TABLET | Freq: Four times a day (QID) | ORAL | Status: DC | PRN
  Administered 2015-05-23 (×2): 1000 mg via ORAL
  Filled 2015-05-22 (×3): qty 2

## 2015-05-22 MED ORDER — COLLAGENASE 250 UNIT/GM EX OINT
1.0000 "application " | TOPICAL_OINTMENT | Freq: Every day | CUTANEOUS | Status: DC
  Administered 2015-05-22 – 2015-05-26 (×5): 1 via TOPICAL
  Filled 2015-05-22 (×2): qty 30

## 2015-05-22 MED ORDER — QUETIAPINE FUMARATE 50 MG PO TABS
50.0000 mg | ORAL_TABLET | Freq: Every day | ORAL | Status: DC
  Administered 2015-05-22 – 2015-05-26 (×5): 50 mg via ORAL
  Filled 2015-05-22 (×6): qty 1

## 2015-05-22 MED ORDER — ONDANSETRON HCL 4 MG/2ML IJ SOLN: 4.0000 mg | Freq: Four times a day (QID) | INTRAMUSCULAR | Status: DC | PRN

## 2015-05-22 MED ORDER — METOLAZONE 2.5 MG PO TABS
2.5000 mg | ORAL_TABLET | ORAL | Status: DC | PRN
  Administered 2015-05-23: 12:00:00 2.5 mg via ORAL
  Filled 2015-05-22: qty 1

## 2015-05-22 MED ORDER — CLOPIDOGREL BISULFATE 300 MG PO TABS
ORAL_TABLET | ORAL | Status: DC | PRN
  Administered 2015-05-22: 300 mg via ORAL

## 2015-05-22 MED ORDER — LIDOCAINE HCL (PF) 1 % IJ SOLN
INTRAMUSCULAR | Status: DC | PRN
  Administered 2015-05-22: 15 mL

## 2015-05-22 MED ORDER — HYDRALAZINE HCL 20 MG/ML IJ SOLN: 10.0000 mg | INTRAMUSCULAR | Status: DC | PRN

## 2015-05-22 MED ORDER — ASPIRIN EC 325 MG PO TBEC
325.0000 mg | DELAYED_RELEASE_TABLET | Freq: Every day | ORAL | Status: DC
  Administered 2015-05-22: 14:00:00 325 mg via ORAL
  Filled 2015-05-22: qty 1

## 2015-05-22 MED ORDER — FUROSEMIDE 20 MG PO TABS
20.0000 mg | ORAL_TABLET | Freq: Every day | ORAL | Status: DC
  Administered 2015-05-22 – 2015-05-27 (×6): 20 mg via ORAL
  Filled 2015-05-22 (×6): qty 1

## 2015-05-22 MED ORDER — AMLODIPINE BESYLATE 2.5 MG PO TABS
2.5000 mg | ORAL_TABLET | Freq: Every day | ORAL | Status: DC
  Administered 2015-05-22 – 2015-05-27 (×6): 2.5 mg via ORAL
  Filled 2015-05-22 (×6): qty 1

## 2015-05-22 MED ORDER — ASPIRIN 81 MG PO CHEW
81.0000 mg | CHEWABLE_TABLET | ORAL | Status: AC
  Administered 2015-05-22: 81 mg via ORAL

## 2015-05-22 MED ORDER — RANOLAZINE ER 500 MG PO TB12
1000.0000 mg | ORAL_TABLET | Freq: Two times a day (BID) | ORAL | Status: DC
  Administered 2015-05-22 – 2015-05-27 (×11): 1000 mg via ORAL
  Filled 2015-05-22 (×11): qty 2

## 2015-05-22 MED ORDER — HYDRALAZINE HCL 20 MG/ML IJ SOLN
INTRAMUSCULAR | Status: DC | PRN
  Administered 2015-05-22: 10 mg via INTRAVENOUS

## 2015-05-22 MED ORDER — SODIUM CHLORIDE 0.9 % IV SOLN
INTRAVENOUS | Status: AC
  Administered 2015-05-22: 10:00:00 via INTRAVENOUS

## 2015-05-22 MED ORDER — CLOPIDOGREL BISULFATE 75 MG PO TABS: 75.0000 mg | ORAL_TABLET | Freq: Every day | ORAL | Status: DC

## 2015-05-22 MED ORDER — OXYCODONE-ACETAMINOPHEN 5-325 MG PO TABS
1.0000 | ORAL_TABLET | Freq: Every day | ORAL | Status: DC
  Administered 2015-05-22 – 2015-05-23 (×2): 1 via ORAL
  Filled 2015-05-22 (×2): qty 1

## 2015-05-22 MED ORDER — ASPIRIN EC 81 MG PO TBEC: 81.0000 mg | DELAYED_RELEASE_TABLET | Freq: Every day | ORAL | Status: DC

## 2015-05-22 MED ORDER — METOPROLOL SUCCINATE ER 25 MG PO TB24: 25.0000 mg | ORAL_TABLET | Freq: Every day | ORAL | Status: DC

## 2015-05-22 MED ORDER — ATORVASTATIN CALCIUM 40 MG PO TABS
40.0000 mg | ORAL_TABLET | Freq: Every day | ORAL | Status: DC
  Administered 2015-05-22 – 2015-05-26 (×5): 40 mg via ORAL
  Filled 2015-05-22 (×5): qty 1

## 2015-05-22 MED ORDER — MIDAZOLAM HCL 2 MG/2ML IJ SOLN
INTRAMUSCULAR | Status: DC | PRN
  Administered 2015-05-22: 1 mg via INTRAVENOUS

## 2015-05-22 MED ORDER — KETOTIFEN FUMARATE 0.025 % OP SOLN
1.0000 [drp] | Freq: Two times a day (BID) | OPHTHALMIC | Status: DC | PRN
  Filled 2015-05-22: qty 5

## 2015-05-22 MED ORDER — IODIXANOL 320 MG/ML IV SOLN
INTRAVENOUS | Status: DC | PRN
  Administered 2015-05-22: 250 mL via INTRA_ARTERIAL

## 2015-05-22 MED ORDER — LIDOCAINE HCL (PF) 1 % IJ SOLN
INTRAMUSCULAR | Status: DC | PRN
  Administered 2015-05-22: 300 mL

## 2015-05-22 MED ORDER — LIDOCAINE HCL (PF) 1 % IJ SOLN
INTRAMUSCULAR | Status: AC
  Filled 2015-05-22: qty 30

## 2015-05-22 MED ORDER — DICLOXACILLIN SODIUM 250 MG PO CAPS
250.0000 mg | ORAL_CAPSULE | Freq: Four times a day (QID) | ORAL | Status: DC
  Administered 2015-05-22 – 2015-05-25 (×10): 250 mg via ORAL
  Filled 2015-05-22 (×15): qty 1

## 2015-05-22 MED ORDER — CLOPIDOGREL BISULFATE 300 MG PO TABS
ORAL_TABLET | ORAL | Status: AC
  Filled 2015-05-22: qty 1

## 2015-05-22 MED ORDER — LORATADINE 10 MG PO TABS
10.0000 mg | ORAL_TABLET | Freq: Every day | ORAL | Status: DC
  Administered 2015-05-22 – 2015-05-25 (×4): 10 mg via ORAL
  Filled 2015-05-22 (×4): qty 1

## 2015-05-22 MED ORDER — DULOXETINE HCL 60 MG PO CPEP
60.0000 mg | ORAL_CAPSULE | Freq: Every day | ORAL | Status: DC
  Administered 2015-05-22 – 2015-05-26 (×5): 60 mg via ORAL
  Filled 2015-05-22 (×6): qty 1

## 2015-05-22 MED ORDER — NITROGLYCERIN 0.4 MG SL SUBL: 0.4000 mg | SUBLINGUAL_TABLET | SUBLINGUAL | Status: DC | PRN

## 2015-05-22 MED ORDER — ACETAMINOPHEN 325 MG PO TABS
650.0000 mg | ORAL_TABLET | ORAL | Status: DC | PRN
  Administered 2015-05-22: 650 mg via ORAL
  Filled 2015-05-22: qty 2

## 2015-05-22 MED ORDER — SODIUM CHLORIDE 0.9 % WEIGHT BASED INFUSION
3.0000 mL/kg/h | INTRAVENOUS | Status: DC
  Administered 2015-05-22: 3 mL/kg/h via INTRAVENOUS

## 2015-05-22 MED ORDER — LIDOCAINE 5 % EX OINT: 1.0000 "application " | TOPICAL_OINTMENT | Freq: Every day | CUTANEOUS | Status: DC | PRN

## 2015-05-22 MED ORDER — HYDRALAZINE HCL 20 MG/ML IJ SOLN
INTRAMUSCULAR | Status: AC
  Filled 2015-05-22: qty 1

## 2015-05-22 MED ORDER — ACETAMINOPHEN 500 MG PO TABS: 1000.0000 mg | ORAL_TABLET | Freq: Four times a day (QID) | ORAL | Status: DC | PRN

## 2015-05-22 MED ORDER — SODIUM CHLORIDE 0.9 % WEIGHT BASED INFUSION: 1.0000 mL/kg/h | INTRAVENOUS | Status: DC

## 2015-05-22 MED ORDER — HEPARIN (PORCINE) IN NACL 2-0.9 UNIT/ML-% IJ SOLN
INTRAMUSCULAR | Status: AC
  Filled 2015-05-22: qty 1000

## 2015-05-22 MED ORDER — PANTOPRAZOLE SODIUM 40 MG PO TBEC
40.0000 mg | DELAYED_RELEASE_TABLET | Freq: Every day | ORAL | Status: DC
  Administered 2015-05-22 – 2015-05-27 (×6): 40 mg via ORAL
  Filled 2015-05-22 (×6): qty 1

## 2015-05-22 MED ORDER — METOPROLOL SUCCINATE ER 25 MG PO TB24
25.0000 mg | ORAL_TABLET | Freq: Every day | ORAL | Status: DC
Start: 2015-05-22 — End: 2015-05-27
  Administered 2015-05-22 – 2015-05-27 (×6): 25 mg via ORAL
  Filled 2015-05-22 (×6): qty 1

## 2015-05-22 MED ORDER — ENSURE ENLIVE PO LIQD
237.0000 mL | Freq: Two times a day (BID) | ORAL | Status: DC
  Administered 2015-05-22 – 2015-05-26 (×6): 237 mL via ORAL
  Filled 2015-05-22 (×16): qty 237

## 2015-05-22 MED ORDER — MENTHOL (TOPICAL ANALGESIC) 4 % EX GEL: Freq: Every day | CUTANEOUS | Status: DC | PRN

## 2015-05-22 MED ORDER — METOPROLOL SUCCINATE ER 50 MG PO TB24
50.0000 mg | ORAL_TABLET | Freq: Every day | ORAL | Status: DC
  Administered 2015-05-23 – 2015-05-26 (×5): 50 mg via ORAL
  Filled 2015-05-22 (×5): qty 1

## 2015-05-22 MED ORDER — FENTANYL CITRATE (PF) 100 MCG/2ML IJ SOLN
INTRAMUSCULAR | Status: AC
  Filled 2015-05-22: qty 4

## 2015-05-22 MED ORDER — INFLUENZA VAC SPLIT QUAD 0.5 ML IM SUSY
0.5000 mL | PREFILLED_SYRINGE | INTRAMUSCULAR | Status: AC
  Administered 2015-05-23: 0.5 mL via INTRAMUSCULAR
  Filled 2015-05-22: qty 0.5

## 2015-05-22 MED ORDER — ALBUTEROL SULFATE (2.5 MG/3ML) 0.083% IN NEBU: 2.5000 mg | INHALATION_SOLUTION | Freq: Four times a day (QID) | RESPIRATORY_TRACT | Status: DC | PRN

## 2015-05-22 MED ORDER — CLOPIDOGREL BISULFATE 75 MG PO TABS
75.0000 mg | ORAL_TABLET | Freq: Every day | ORAL | Status: DC
  Administered 2015-05-23 – 2015-05-27 (×5): 75 mg via ORAL
  Filled 2015-05-22 (×5): qty 1

## 2015-05-22 MED ORDER — ISOSORBIDE MONONITRATE ER 30 MG PO TB24
30.0000 mg | ORAL_TABLET | Freq: Two times a day (BID) | ORAL | Status: DC
  Administered 2015-05-22 – 2015-05-27 (×11): 30 mg via ORAL
  Filled 2015-05-22 (×11): qty 1

## 2015-05-22 MED ORDER — FENTANYL CITRATE (PF) 100 MCG/2ML IJ SOLN
INTRAMUSCULAR | Status: DC | PRN
  Administered 2015-05-22 (×2): 25 ug via INTRAVENOUS

## 2015-05-22 MED ORDER — HEPARIN SODIUM (PORCINE) 1000 UNIT/ML IJ SOLN
INTRAMUSCULAR | Status: DC | PRN
  Administered 2015-05-22: 3000 [IU] via INTRAVENOUS
  Administered 2015-05-22: 6000 [IU] via INTRAVENOUS

## 2015-05-22 MED ORDER — ASPIRIN 81 MG PO CHEW
CHEWABLE_TABLET | ORAL | Status: AC
  Filled 2015-05-22: qty 1

## 2015-05-22 MED ORDER — HEPARIN SODIUM (PORCINE) 1000 UNIT/ML IJ SOLN
INTRAMUSCULAR | Status: AC
  Filled 2015-05-22: qty 1

## 2015-05-22 MED ORDER — FLUTICASONE PROPIONATE 50 MCG/ACT NA SUSP
1.0000 | Freq: Every day | NASAL | Status: DC
  Administered 2015-05-22 – 2015-05-24 (×3): 1 via NASAL
  Filled 2015-05-22: qty 16

## 2015-05-22 MED ORDER — MIDAZOLAM HCL 2 MG/2ML IJ SOLN
INTRAMUSCULAR | Status: AC
  Filled 2015-05-22: qty 4

## 2015-05-22 SURGICAL SUPPLY — 27 items
BALLN CHOCOLATE 4.0X120X135 (BALLOONS) ×2
BALLOON CHOCOLATE 4.0X120X135 (BALLOONS) IMPLANT
BALLOON SABER 5.0X100X150 (BALLOONS) ×1 IMPLANT
CATH ANGIO 5F PIGTAIL 65CM (CATHETERS) ×1 IMPLANT
CATH CROSS OVER TEMPO 5F (CATHETERS) ×1 IMPLANT
CATH VIANCE CROSS STAND 150CM (MICROCATHETER) ×2
CATH VIANCE CROSS STD 150CM (MICROCATHETER) IMPLANT
DEVICE SPIDERFX EMB PROT 5MM (WIRE) ×1 IMPLANT
DEVICE TORQUE .014-.018 (MISCELLANEOUS) IMPLANT
GUIDEWIRE ANGLED .035X150CM (WIRE) ×1 IMPLANT
GUIDEWIRE ASTATO XS 20G 300CM (WIRE) ×1 IMPLANT
KIT ENCORE 26 ADVANTAGE (KITS) ×1 IMPLANT
KIT PV (KITS) ×2 IMPLANT
NITREX .014 300CM (WIRE) ×1 IMPLANT
SHEATH HIGHFLEX ANSEL 7FR 55CM (SHEATH) ×1 IMPLANT
SHEATH PINNACLE 5F 10CM (SHEATH) ×1 IMPLANT
SHEATH PINNACLE 7F 10CM (SHEATH) ×1 IMPLANT
STENT SMART FLEX 6X120X120 (Permanent Stent) ×1 IMPLANT
SYRINGE MEDRAD AVANTA MACH 7 (SYRINGE) ×1 IMPLANT
TAPE RADIOPAQUE TURBO (MISCELLANEOUS) ×1 IMPLANT
TORQUE DEVICE .014-.018 (MISCELLANEOUS) ×2
TRANSDUCER W/STOPCOCK (MISCELLANEOUS) ×2 IMPLANT
TRAY PV CATH (CUSTOM PROCEDURE TRAY) ×2 IMPLANT
TUBING CIL FLEX 10 FLL-RA (TUBING) ×1 IMPLANT
WIRE HITORQ VERSACORE ST 145CM (WIRE) ×1 IMPLANT
WIRE ROSEN-J .035X180CM (WIRE) ×1 IMPLANT
WIRE SPARTACORE .014X300CM (WIRE) ×1 IMPLANT

## 2015-05-22 NOTE — Consult Note (Signed)
WOC wound consult note Reason for Consult: LLE wound.  Patient with recent successful stent placement today for critical limb ischemia  Wound type: arterial  Pressure Ulcer POA: No Measurement: 9cm x 7cm x 0.1cm  Wound bed:95% yellow/black with 5% pink at most distal edge of the wound bed Drainage (amount, consistency, odor) minimal, but with some odor Periwound:erthyma Dressing procedure/placement/frequency:  Enzymatic debridement ointment now that patient has revascularization. Cover with moist gauze, change daily.  Explained rationale for treatment to the wife in the room.   Discussed POC with bedside nurse.  Re consult if needed, will not follow at this time. Thanks  Suzetta Timko Kellogg, [place] [phone])

## 2015-05-22 NOTE — Progress Notes (Signed)
    I was called about a chronic non healing wound on his left lower leg. Per nurse, Mickel Baas, patient broke out in a sweat and was tachycardic. No fevers. The patient's wife said that he was recently placed on dicloxacillin to treat this wound but he did not take it as prescribed. I removed the bandage and examined the wound. It does not appear grossly infected. His wife ( his caregiver ) said the wound looks close to its baseline,  however, it doesn't usually have any odor. It was faintly malodorous. For now  I will just resume dicloxacillin as he was prescribed as an outpatient and have wound care nursing see him to assess and re-dress the wound. I will also send in a CBC w/ diff.  Right now he appears mildly diaphoretic, HR in 80s and afebrile. Continue to monitor.  Angelena Form PA-C  [facility]

## 2015-05-22 NOTE — H&P (View-Only) (Signed)
05/17/2015 Gavin [name]   21-Oct-1928  [id number]  Primary Physician Ann Held, MD Primary Cardiologist: [name] [name]   HPI:  77-old white married male with a history of coronary disease with last cardiac catheterization July 2010 revealing an occluded LAD stent otherwise normal coronary arteries. At that time his EF was 30%, subsequent 2-D echo in 2013 revealed EF is normal. He had a stress test in 2012 with mod to severe perfusion defect, maybe slightly worse than previous study.At that time the pt was without symptoms so no further work up was done. He also has a history of PAF and is on Coumadin for anticoagulation. He has been maintaining sinus rhythm. He has peripheral vascular disease with stenting by Dr. [name] to both SFAs in 2004 with 0 vessel runoff on the right and one on the left via peroneal. he also has bilateral carotid disease left greater than right. Other problems include hypertension hyperlipidemia, and obstructive sleep apnea on CPAP. He has chronic headache with neg. head CT in June, and recently per our office stopped his IMDUR to see if Headache would resolve. It did not. He resumed the Imdur 05/29/13. He was admitted 05/29/13 with recurrent chest pain worrisome for unstable angina. His Coumadin was held. His Troponin was negative. He underwent coronary angiogram 06/01/13. This revealed moderate RCA, ostial CFX, and distal Dx1 disease with know distal LAD occlusion. There was no obvious culprit lesion and the plan is for medical Rx. Norvasc and Ranexa were added and his Imdur was decreased.he was seen by [name] [facility] in the office 06/08/13 for post consultation followup. . I last saw him in the office earlier this year. He has paroxysmal atrial fibrillation on Coumadin anticoagulation. He has fallen several times one involving head trauma.  Since I saw him in the office one year ago he was admitted back in February for right shoulder pain which  ended up being a non-STEMI. He was catheterized by Dr. Burt Knack revealing 80% mid dominant RCA stenosis which was stented using a drug-eluting stent. He is placed on Brilenta. Since that time he noticed increasing shortness of breath. Since I saw him in the office 2 months ago he developed a nonhealing wound on the medial aspect of his left calf. Dopplers revealed decrease in his ABI from 0.86 on the left with a patent SFA to non-measurable because of pain and an occluded left SFA. I believe he has critical limb ischemia we'll arrange for him to undergo angiography potential intervention. I did stop his Brilenta because of shortness of breath and transition him to Plavix which improved this problem.   Current Outpatient Prescriptions  Medication Sig Dispense Refill  . acetaminophen-codeine (TYLENOL #3) 300-30 MG per tablet Take 2 tablets by mouth every 4 (four) hours as needed for moderate pain.    Marland Kitchen albuterol (PROVENTIL HFA;VENTOLIN HFA) 108 (90 BASE) MCG/ACT inhaler Inhale into the lungs every 6 (six) hours as needed for wheezing or shortness of breath. ProAir    . amLODipine (NORVASC) 2.5 MG tablet Take 1 tablet (2.5 mg total) by mouth daily. 30 tablet 11  . aspirin EC 81 MG tablet Take 81 mg by mouth at bedtime.     Marland Kitchen atorvastatin (LIPITOR) 40 MG tablet Take 1 tablet (40 mg total) by mouth at bedtime. 31 tablet 11  . Cholecalciferol (VITAMIN D) 2000 UNITS CAPS Take 2,000 Units by mouth daily before supper.    . clopidogrel (PLAVIX) 75 MG tablet  Take 1 tablet (75 mg total) by mouth daily. 30 tablet 11  . dicloxacillin (DYNAPEN) 250 MG capsule Take 250 mg by mouth 4 (four) times daily. For 10 days    . DULoxetine (CYMBALTA) 60 MG capsule Take 60 mg by mouth daily.  0  . furosemide (LASIX) 40 MG tablet Take 40 mg by mouth daily. Take 1/2 tablet daily  0  . ibuprofen (ADVIL,MOTRIN) 200 MG tablet Take 200 mg by mouth daily as needed for headache.    . isosorbide mononitrate (IMDUR) 30 MG 24 hr tablet  Take 30 mg by mouth 2 (two) times daily.   0  . Ketotifen Fumarate (ALAWAY OP) Place 1 drop into both eyes 2 (two) times daily as needed (dry eyes).    Marland Kitchen lidocaine (XYLOCAINE) 5 % ointment Apply 1 application topically daily as needed. Apply to lower leg on skin around wound, not to place in wound    . Menthol, Topical Analgesic, (BIOFREEZE EX) Apply 1 application topically daily as needed (pain).    Marland Kitchen metolazone (ZAROXOLYN) 2.5 MG tablet Take 2.5 mg by mouth daily. Every other day    . metoprolol (LOPRESSOR) 50 MG tablet take 1 tablet by mouth twice a day 30 tablet 4  . mirtazapine (REMERON) 15 MG tablet Take 15 mg by mouth at bedtime.  0  . Misc Natural Products (LUTEIN VISION BLEND PO) Take 1 tablet by mouth 2 (two) times daily.    . Multiple Vitamin (MULTIVITAMIN) tablet Take 1 tablet by mouth daily.    . Multiple Vitamins-Minerals (PRESERVISION AREDS PO) Take 1 tablet by mouth 2 (two) times daily.    Marland Kitchen NAMZARIC 28-10 MG CP24 Take 1 capsule by mouth daily.  0  . NASONEX 50 MCG/ACT nasal spray Place 2 sprays into the nose 2 (two) times daily.     . nitroGLYCERIN (NITROSTAT) 0.4 MG SL tablet Place 0.4 mg under the tongue every 5 (five) minutes as needed for chest pain.    Marland Kitchen oxyCODONE-acetaminophen (PERCOCET/ROXICET) 5-325 MG per tablet Take 1 tablet by mouth at bedtime.  0  . pantoprazole (PROTONIX) 40 MG tablet take 1 tablet by mouth once daily 30 tablet 6  . QUEtiapine (SEROQUEL) 50 MG tablet Take 50 mg by mouth daily before supper.   1  . RANEXA 1000 MG SR tablet Take 1,000 mg by mouth 2 (two) times daily.  0  . SANTYL ointment Apply 1 application topically daily.  0  . sertraline (ZOLOFT) 50 MG tablet Take 50 mg by mouth every morning.    . tamsulosin (FLOMAX) 0.4 MG CAPS capsule Take 0.4 mg by mouth daily after supper.    . vitamin B-12 (CYANOCOBALAMIN) 1000 MCG tablet Take 1,000 mcg by mouth daily before supper.      No current facility-administered medications for this visit.     Allergies  Allergen Reactions  . Ciprofloxacin     unknown  . Ciprofloxacin Other (See Comments)    Unknown allergic reaction, possible shortness of breath  . Codeine     "knocked me out"  . Codeine Other (See Comments)    "knocked me out"  . Dexamethasone     unknown  . Dexamethasone Other (See Comments)    Unknown allergic reaction  . Sulfa Antibiotics Nausea And Vomiting  . Sulfonamide Derivatives     Unknown reaction  . Telmisartan     unknown  . Telmisartan Other (See Comments)    Micardis - heart races    Social History  Social History  . Marital Status: Married    Spouse Name: N/A  . Number of Children: N/A  . Years of Education: N/A   Occupational History  . 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 Smoking status: Former Smoker    Types: Cigarettes    Quit date: 09/23/1973  . Smokeless tobacco: Never Used     Comment: tried snuff and chew as a child (55yr) did not continue  . Alcohol Use: No     Comment: seldom-- every three or four years  . Drug Use: No  . Sexual Activity: No   Other Topics Concern  . Not on file   Social History Narrative   ** Merged History Encounter **         Review of Systems: General: negative for chills, fever, night sweats or weight changes.  Cardiovascular: negative for chest pain, dyspnea on exertion, edema, orthopnea, palpitations, paroxysmal nocturnal dyspnea or shortness of breath Dermatological: negative for rash Respiratory: negative for cough or wheezing Urologic: negative for hematuria Abdominal: negative for nausea, vomiting, diarrhea, bright red blood per rectum, melena, or hematemesis Neurologic: negative for visual changes, syncope, or dizziness All other systems reviewed and are otherwise negative except as noted above.    Blood pressure 158/74, pulse 83, height 5\' 7"  (1.702 m), weight 193 lb (87.544 kg).  General appearance: alert and no distress Neck: no  adenopathy, no carotid bruit, no JVD, supple, symmetrical, trachea midline and thyroid not enlarged, symmetric, no tenderness/mass/nodules Lungs: clear to auscultation bilaterally Heart: regular rate and rhythm, S1, S2 normal, no murmur, click, rub or gallop Extremities: extremities normal, atraumatic, no cyanosis or edema  EKG sinus rhythm 83 with occasional PVCs. I personally reviewed this EKG  ASSESSMENT AND PLAN:   PAD (peripheral artery disease), history of stenting to both SFAs History of peripheral arterial disease status post bilateral SFA stenting in the past. He had Dopplers performed 04/20/14 revealed a right ABI 0.8 and a left upward 86. Prior angiograms of shown one-vessel runoff on the left. He did have hypertension signal in his distal left SFA. The last 4-6 weeks developed a wound on the medial aspect of his left calf which is being getting progressively larger and being treated at the aspirate wound care center. Recent Dopplers performed 05/10/15 revealed an occluded distal left SFA. I suspect that his wound will not heal unless he has restoration of antegrade blood flow and will I am going to set him up for intervention and potential intervention next week.      [name] FACP,FACC,FAHA, [facility] 05/17/2015 3:20 PM

## 2015-05-22 NOTE — Research (Signed)
SAFE Informed Consent   Subject Name: Gavin [name]  Subject met inclusion and exclusion criteria.  The informed consent form, study requirements and expectations were reviewed with the subject and questions and concerns were addressed prior to the signing of the consent form.  The subject verbalized understanding of the trail requirements.  The subject agreed to participate in the SAFE trial and signed the informed consent.  The informed consent was obtained prior to performance of any protocol-specific procedures for the subject.  A copy of the signed informed consent was given to the subject and a copy was placed in the subject's medical record.  [name] 05/22/2015, 07:30 AM

## 2015-05-22 NOTE — Progress Notes (Signed)
Site area: right groin  Site Prior to Removal:  Level 0  Pressure Applied For 25 MINUTES    Minutes Beginning at 1220  Manual:   Yes.    Patient Status During Pull:  stable  Post Pull Groin Site:  Level 0  Post Pull Instructions Given:  Yes.    Post Pull Pulses Present:  Yes.    Dressing Applied:  Yes.    Comments:  Noted hematoma distal to site and pressure held for 15 minutes at 1330. Large amount of bruising noted, area soft with no hematoma.  Noted hematoma in area distal to site and pressure held for 15 minutes at 1415. Large amount of bruising noted after pressure held and area soft without hematoma. Dr. [name] notified of need to hold pressure 3 times at right groin and bruising. Dr. [name] into see patient and assess right groin site.  Monitored site closely for remainder of shift with no further bleeding, hematoma noted. Area remains soft, tender with palpation, unchanged during assessments.

## 2015-05-22 NOTE — Interval H&P Note (Signed)
History and Physical Interval Note:  05/22/2015 7:42 AM  Gavin [name]  has presented today for surgery, with the diagnosis of pad  The various methods of treatment have been discussed with the patient and family. After consideration of risks, benefits and other options for treatment, the patient has consented to  Procedure(s): Lower Extremity Angiography (N/A) as a surgical intervention .  The patient's history has been reviewed, patient examined, no change in status, stable for surgery.  I have reviewed the patient's chart and labs.  Questions were answered to the patient's satisfaction.     Quay Burow

## 2015-05-23 DIAGNOSIS — R8290 Unspecified abnormal findings in urine: Secondary | ICD-10-CM | POA: Diagnosis not present

## 2015-05-23 DIAGNOSIS — I10 Essential (primary) hypertension: Secondary | ICD-10-CM | POA: Diagnosis not present

## 2015-05-23 DIAGNOSIS — E785 Hyperlipidemia, unspecified: Secondary | ICD-10-CM | POA: Diagnosis not present

## 2015-05-23 DIAGNOSIS — Z888 Allergy status to other drugs, medicaments and biological substances status: Secondary | ICD-10-CM | POA: Diagnosis not present

## 2015-05-23 DIAGNOSIS — H9190 Unspecified hearing loss, unspecified ear: Secondary | ICD-10-CM | POA: Diagnosis not present

## 2015-05-23 DIAGNOSIS — S81802A Unspecified open wound, left lower leg, initial encounter: Secondary | ICD-10-CM | POA: Diagnosis not present

## 2015-05-23 DIAGNOSIS — Z95820 Peripheral vascular angioplasty status with implants and grafts: Secondary | ICD-10-CM | POA: Diagnosis not present

## 2015-05-23 DIAGNOSIS — E876 Hypokalemia: Secondary | ICD-10-CM | POA: Diagnosis not present

## 2015-05-23 DIAGNOSIS — F05 Delirium due to known physiological condition: Secondary | ICD-10-CM | POA: Diagnosis not present

## 2015-05-23 DIAGNOSIS — I2510 Atherosclerotic heart disease of native coronary artery without angina pectoris: Secondary | ICD-10-CM | POA: Diagnosis not present

## 2015-05-23 DIAGNOSIS — Z7951 Long term (current) use of inhaled steroids: Secondary | ICD-10-CM | POA: Diagnosis not present

## 2015-05-23 DIAGNOSIS — Z7902 Long term (current) use of antithrombotics/antiplatelets: Secondary | ICD-10-CM | POA: Diagnosis not present

## 2015-05-23 DIAGNOSIS — I480 Paroxysmal atrial fibrillation: Secondary | ICD-10-CM | POA: Diagnosis not present

## 2015-05-23 DIAGNOSIS — Z9842 Cataract extraction status, left eye: Secondary | ICD-10-CM | POA: Diagnosis not present

## 2015-05-23 DIAGNOSIS — I252 Old myocardial infarction: Secondary | ICD-10-CM | POA: Diagnosis not present

## 2015-05-23 DIAGNOSIS — G4733 Obstructive sleep apnea (adult) (pediatric): Secondary | ICD-10-CM | POA: Diagnosis not present

## 2015-05-23 DIAGNOSIS — Z7982 Long term (current) use of aspirin: Secondary | ICD-10-CM | POA: Diagnosis not present

## 2015-05-23 DIAGNOSIS — I739 Peripheral vascular disease, unspecified: Secondary | ICD-10-CM | POA: Diagnosis not present

## 2015-05-23 DIAGNOSIS — F0390 Unspecified dementia without behavioral disturbance: Secondary | ICD-10-CM | POA: Diagnosis not present

## 2015-05-23 DIAGNOSIS — Z9841 Cataract extraction status, right eye: Secondary | ICD-10-CM | POA: Diagnosis not present

## 2015-05-23 DIAGNOSIS — Z955 Presence of coronary angioplasty implant and graft: Secondary | ICD-10-CM | POA: Diagnosis not present

## 2015-05-23 DIAGNOSIS — Z881 Allergy status to other antibiotic agents status: Secondary | ICD-10-CM | POA: Diagnosis not present

## 2015-05-23 DIAGNOSIS — I998 Other disorder of circulatory system: Secondary | ICD-10-CM | POA: Diagnosis present

## 2015-05-23 DIAGNOSIS — J449 Chronic obstructive pulmonary disease, unspecified: Secondary | ICD-10-CM | POA: Diagnosis not present

## 2015-05-23 DIAGNOSIS — K219 Gastro-esophageal reflux disease without esophagitis: Secondary | ICD-10-CM | POA: Diagnosis not present

## 2015-05-23 DIAGNOSIS — Z882 Allergy status to sulfonamides status: Secondary | ICD-10-CM | POA: Diagnosis not present

## 2015-05-23 DIAGNOSIS — I6523 Occlusion and stenosis of bilateral carotid arteries: Secondary | ICD-10-CM | POA: Diagnosis not present

## 2015-05-23 DIAGNOSIS — Z961 Presence of intraocular lens: Secondary | ICD-10-CM | POA: Diagnosis not present

## 2015-05-23 DIAGNOSIS — Z9181 History of falling: Secondary | ICD-10-CM | POA: Diagnosis not present

## 2015-05-23 DIAGNOSIS — D649 Anemia, unspecified: Secondary | ICD-10-CM | POA: Diagnosis not present

## 2015-05-23 DIAGNOSIS — I7092 Chronic total occlusion of artery of the extremities: Secondary | ICD-10-CM | POA: Diagnosis not present

## 2015-05-23 DIAGNOSIS — Z87891 Personal history of nicotine dependence: Secondary | ICD-10-CM | POA: Diagnosis not present

## 2015-05-23 LAB — BASIC METABOLIC PANEL
ANION GAP: 7 (ref 5–15)
Anion gap: 7 (ref 5–15)
BUN: 10 mg/dL (ref 6–20)
BUN: 10 mg/dL (ref 6–20)
CALCIUM: 7.6 mg/dL — AB (ref 8.9–10.3)
CALCIUM: 7.9 mg/dL — AB (ref 8.9–10.3)
CO2: 27 mmol/L (ref 22–32)
CO2: 28 mmol/L (ref 22–32)
CREATININE: 1.22 mg/dL (ref 0.61–1.24)
Chloride: 103 mmol/L (ref 101–111)
Chloride: 104 mmol/L (ref 101–111)
Creatinine, Ser: 1.14 mg/dL (ref 0.61–1.24)
GFR calc Af Amer: >60 mL/min (ref >60)
GFR, EST NON AFRICAN AMERICAN: 57 mL/min — AB (ref >60)
GFR, EST NON AFRICAN AMERICAN: 53 mL/min — AB (ref >60)
GLUCOSE: 112 mg/dL — AB (ref 65–99)
Glucose, Bld: 124 mg/dL — ABNORMAL HIGH (ref 65–99)
Potassium: 2.9 mmol/L — ABNORMAL LOW (ref 3.5–5.1)
Potassium: 3.2 mmol/L — ABNORMAL LOW (ref 3.5–5.1)
SODIUM: 137 mmol/L (ref 135–145)
SODIUM: 139 mmol/L (ref 135–145)

## 2015-05-23 LAB — URINE MICROSCOPIC-ADD ON

## 2015-05-23 LAB — CBC
HCT: 31 % — ABNORMAL LOW (ref 39.0–52.0)
Hemoglobin: 10.3 g/dL — ABNORMAL LOW (ref 13.0–17.0)
MCH: 32 pg (ref 26.0–34.0)
MCHC: 33.2 g/dL (ref 30.0–36.0)
MCV: 96.3 fL (ref 78.0–100.0)
PLATELETS: 384 10*3/uL (ref 150–400)
RBC: 3.22 MIL/uL — ABNORMAL LOW (ref 4.22–5.81)
RDW: 15.5 % (ref 11.5–15.5)
WBC: 10.4 10*3/uL (ref 4.0–10.5)

## 2015-05-23 LAB — URINALYSIS, ROUTINE W REFLEX MICROSCOPIC
Glucose, UA: NEGATIVE mg/dL
HGB URINE DIPSTICK: NEGATIVE
KETONES UR: NEGATIVE mg/dL
Nitrite: POSITIVE — AB
PROTEIN: 100 mg/dL — AB
SPECIFIC GRAVITY, URINE: 1.02 (ref 1.005–1.030)
UROBILINOGEN UA: 1 mg/dL (ref 0.0–1.0)
pH: 5.5 (ref 5.0–8.0)

## 2015-05-23 MED ORDER — ASPIRIN EC 81 MG PO TBEC
81.0000 mg | DELAYED_RELEASE_TABLET | Freq: Every day | ORAL | Status: DC
  Administered 2015-05-23 – 2015-05-27 (×5): 81 mg via ORAL
  Filled 2015-05-23 (×5): qty 1

## 2015-05-23 MED ORDER — POTASSIUM CHLORIDE CRYS ER 20 MEQ PO TBCR
40.0000 meq | EXTENDED_RELEASE_TABLET | Freq: Two times a day (BID) | ORAL | Status: AC
  Administered 2015-05-23 (×2): 40 meq via ORAL
  Filled 2015-05-23 (×2): qty 2

## 2015-05-23 MED ORDER — OXYCODONE-ACETAMINOPHEN 5-325 MG PO TABS
1.0000 | ORAL_TABLET | Freq: Four times a day (QID) | ORAL | Status: DC | PRN
  Administered 2015-05-23 – 2015-05-24 (×2): 1 via ORAL
  Filled 2015-05-23 (×2): qty 1

## 2015-05-23 NOTE — Progress Notes (Signed)
Placed patient on CPAP for the night on auto mode 5-20cmH20 with 2L 02 bleed in via medium nasal mask.  Patient is tolerating well at this time.

## 2015-05-23 NOTE — Progress Notes (Signed)
Patient Name: Gavin [name] Date of Encounter: 05/23/2015   SUBJECTIVE  Feels better. No chest pain, sob or palpitation. Only complains of L Lower leg pain.   CURRENT MEDS . amLODipine  2.5 mg Oral Daily  . aspirin EC  325 mg Oral Daily  . atorvastatin  40 mg Oral QHS  . clopidogrel  75 mg Oral Q breakfast  . collagenase  1 application Topical Daily  . dicloxacillin  250 mg Oral QID  . DULoxetine  60 mg Oral Daily  . feeding supplement (ENSURE ENLIVE)  237 mL Oral BID BM  . fluticasone  1 spray Each Nare Daily  . furosemide  20 mg Oral Daily  . Influenza vac split quadrivalent PF  0.5 mL Intramuscular Tomorrow-1000  . isosorbide mononitrate  30 mg Oral BID  . loratadine  10 mg Oral Daily  . metoprolol succinate  25 mg Oral Daily   And  . metoprolol succinate  50 mg Oral QHS  . oxyCODONE-acetaminophen  1 tablet Oral QHS  . pantoprazole  40 mg Oral Daily  . QUEtiapine  50 mg Oral QAC supper  . ranolazine  1,000 mg Oral BID    OBJECTIVE  Filed Vitals:   05/22/15 2000 05/22/15 2337 05/23/15 0124 05/23/15 0322  BP: 105/51 129/41 119/77 107/67  Pulse:   84   Temp: 97.9 F (36.6 C) 98.1 F (36.7 C)  97.5 F (36.4 C)  TempSrc: Oral Oral  Oral  Resp: 17 16  25   Height:      Weight:    202 lb 9.6 oz (91.9 kg)  SpO2: 92% 96%  92%    Intake/Output Summary (Last 24 hours) at 05/23/15 0816 Last data filed at 05/23/15 0619  Gross per 24 hour  Intake   1335 ml  Output    725 ml  Net    610 ml   Filed Weights   05/22/15 0559 05/23/15 0322  Weight: 183 lb (83.008 kg) 202 lb 9.6 oz (91.9 kg)    PHYSICAL EXAM  General: Pleasant, NAD. Neuro: Alert and oriented X 3. Moves all extremities spontaneously. Psych: Normal affect. HEENT:  Normal  Neck: Supple without bruits or JVD. Lungs:  Resp regular and unlabored, CTA. Heart: RRR no s3, s4, or murmurs. Abdomen: Soft, non-tender, non-distended, BS + x 4.  Extremities: No clubbing, cyanosis edema. Diminished distal  pulse. L lower leg cover with gauze, mild malodorous.  Accessory Clinical Findings  CBC  Recent Labs  05/22/15 1825 05/23/15 0227  WBC 11.3* 10.4  NEUTROABS 8.6*  --   HGB 11.3* 10.3*  HCT 34.1* 31.0*  MCV 96.1 96.3  PLT 440* 676   Basic Metabolic Panel  Recent Labs  05/23/15 0227  NA 137  K 2.9*  CL 103  CO2 27  GLUCOSE 112*  BUN 10  CREATININE 1.14  CALCIUM 7.6*    TELE  NSR  Pre Procedure Diagnosis: peripheral arterial disease  Post Procedure Diagnosis: peripheral arterial disease  Operators: Dr. Quay Burow  Procedures Performed: 1. Abdominal aortography, bilateral iliac angiography, bifemoral runoff 2. Contralateral access, placement of a spider distal protection device in the P3 segment 3. Crossing of chronic total occlusion with a Viance catheter, PTCA with a chocolate balloon. 4. Stenting of distal left SFA and P1 segment of popliteal with a nitinol self-expanding stent  Estimated blood loss: Less than 20 mL  PROCEDURE DESCRIPTION:   The patient was brought to the second floor [facility] cath lab in  the the postabsorptive state. He was premedicated with Valium 5 mg by mouth, IV Versed and fentanyl. His right groin was prepped and shaved in usual sterile fashion. Xylocaine 1% was used for local anesthesia. A 5 French sheath was inserted into the right common femoral artery using standard Seldinger technique. A 5 French pigtail catheter was placed in the distal abdominal aorta. Distal abdominal aortography, bilateral iliac angiography, and bifemoral runoff was performed using bolus chase digital subtraction step table technique. Visipaque was used for the entirety of the case. Retrograde aortic pressure was monitored during the case.   Angiographic Data:   1: Abdominal aortogram-fluoroscopically calcified but not stenosed. 2: Left lower extremity-fluoroscopically calcified proximal and  midsegment left SFA, occluded stent distal left SFA with reconstitution in the P2 segment of the popliteal artery. There was one-vessel runoff via the peroneal 3: Right lower extremity-patent stent mid right SFA with 0 vessel runoff  Final Impression: successful PTA and restenting of a CTO in the setting of critical limb ischemia with spider distal protection. The patient tolerated the procedure well. The sheath will be removed once the ACT falls below and 170 and pressure will be held. He will be hydrated overnight. We will obtain a wound care consult. He may need systemic at the bionics and debriding. We will continue with dual anti-platelet therapy. He will obtain lower extremity or chill Doppler studies and her [place] office the week after discharge and will see me back in one to 2 weeks thereafter.  ASSESSMENT AND PLAN  1. PAD (peripheral artery disease), history of stenting to both SFAs - Angiography showed patent right SFA; crossing of chronic total occlusion with a Viance catheter, PTCA with a chocolate balloon. S/p stenting of distal left SFA and P1 segment of popliteal with a nitinol self-expanding stent - Continue ASA 81mg  and plavix  2.   Critical lower limb ischemia - LLE wound. - Seen by [facility] nurse. Recommended enzymatic debridement ointment now that patient has revascularization. Cover with moist gauze, change daily. Will continue current Abx with dicloxacillin.   3. Hypokalemia - K of 2.9 today. Likely dilution from hydration overnight.  - At home, on Lasix and metolazone PRN. - Will give Kdur 40mg  BID today.    Jarrett Soho PA-C Pager [phone]

## 2015-05-24 DIAGNOSIS — K219 Gastro-esophageal reflux disease without esophagitis: Secondary | ICD-10-CM | POA: Diagnosis present

## 2015-05-24 DIAGNOSIS — F05 Delirium due to known physiological condition: Secondary | ICD-10-CM | POA: Diagnosis not present

## 2015-05-24 DIAGNOSIS — F0390 Unspecified dementia without behavioral disturbance: Secondary | ICD-10-CM | POA: Diagnosis not present

## 2015-05-24 DIAGNOSIS — R410 Disorientation, unspecified: Secondary | ICD-10-CM

## 2015-05-24 DIAGNOSIS — J449 Chronic obstructive pulmonary disease, unspecified: Secondary | ICD-10-CM | POA: Diagnosis present

## 2015-05-24 DIAGNOSIS — I2510 Atherosclerotic heart disease of native coronary artery without angina pectoris: Secondary | ICD-10-CM | POA: Diagnosis not present

## 2015-05-24 DIAGNOSIS — I998 Other disorder of circulatory system: Secondary | ICD-10-CM

## 2015-05-24 DIAGNOSIS — Z9181 History of falling: Secondary | ICD-10-CM | POA: Diagnosis not present

## 2015-05-24 DIAGNOSIS — I10 Essential (primary) hypertension: Secondary | ICD-10-CM | POA: Diagnosis present

## 2015-05-24 DIAGNOSIS — E876 Hypokalemia: Secondary | ICD-10-CM | POA: Diagnosis present

## 2015-05-24 DIAGNOSIS — R488 Other symbolic dysfunctions: Secondary | ICD-10-CM | POA: Diagnosis not present

## 2015-05-24 DIAGNOSIS — F29 Unspecified psychosis not due to a substance or known physiological condition: Secondary | ICD-10-CM | POA: Diagnosis not present

## 2015-05-24 DIAGNOSIS — Z95820 Peripheral vascular angioplasty status with implants and grafts: Secondary | ICD-10-CM | POA: Diagnosis not present

## 2015-05-24 DIAGNOSIS — Z7951 Long term (current) use of inhaled steroids: Secondary | ICD-10-CM | POA: Diagnosis not present

## 2015-05-24 DIAGNOSIS — R262 Difficulty in walking, not elsewhere classified: Secondary | ICD-10-CM | POA: Diagnosis not present

## 2015-05-24 DIAGNOSIS — Z7982 Long term (current) use of aspirin: Secondary | ICD-10-CM | POA: Diagnosis not present

## 2015-05-24 DIAGNOSIS — R918 Other nonspecific abnormal finding of lung field: Secondary | ICD-10-CM | POA: Diagnosis not present

## 2015-05-24 DIAGNOSIS — I739 Peripheral vascular disease, unspecified: Secondary | ICD-10-CM | POA: Diagnosis not present

## 2015-05-24 DIAGNOSIS — M549 Dorsalgia, unspecified: Secondary | ICD-10-CM | POA: Diagnosis not present

## 2015-05-24 DIAGNOSIS — L97229 Non-pressure chronic ulcer of left calf with unspecified severity: Secondary | ICD-10-CM | POA: Diagnosis not present

## 2015-05-24 DIAGNOSIS — Z87891 Personal history of nicotine dependence: Secondary | ICD-10-CM | POA: Diagnosis not present

## 2015-05-24 DIAGNOSIS — I252 Old myocardial infarction: Secondary | ICD-10-CM | POA: Diagnosis not present

## 2015-05-24 DIAGNOSIS — M1990 Unspecified osteoarthritis, unspecified site: Secondary | ICD-10-CM | POA: Diagnosis not present

## 2015-05-24 DIAGNOSIS — I6523 Occlusion and stenosis of bilateral carotid arteries: Secondary | ICD-10-CM | POA: Diagnosis present

## 2015-05-24 DIAGNOSIS — R1311 Dysphagia, oral phase: Secondary | ICD-10-CM | POA: Diagnosis not present

## 2015-05-24 DIAGNOSIS — S81802A Unspecified open wound, left lower leg, initial encounter: Secondary | ICD-10-CM | POA: Diagnosis present

## 2015-05-24 DIAGNOSIS — R1310 Dysphagia, unspecified: Secondary | ICD-10-CM | POA: Diagnosis not present

## 2015-05-24 DIAGNOSIS — Z882 Allergy status to sulfonamides status: Secondary | ICD-10-CM | POA: Diagnosis not present

## 2015-05-24 DIAGNOSIS — Z888 Allergy status to other drugs, medicaments and biological substances status: Secondary | ICD-10-CM | POA: Diagnosis not present

## 2015-05-24 DIAGNOSIS — I509 Heart failure, unspecified: Secondary | ICD-10-CM | POA: Diagnosis not present

## 2015-05-24 DIAGNOSIS — Z9841 Cataract extraction status, right eye: Secondary | ICD-10-CM | POA: Diagnosis not present

## 2015-05-24 DIAGNOSIS — A419 Sepsis, unspecified organism: Secondary | ICD-10-CM | POA: Diagnosis not present

## 2015-05-24 DIAGNOSIS — E785 Hyperlipidemia, unspecified: Secondary | ICD-10-CM | POA: Diagnosis present

## 2015-05-24 DIAGNOSIS — F0151 Vascular dementia with behavioral disturbance: Secondary | ICD-10-CM | POA: Diagnosis not present

## 2015-05-24 DIAGNOSIS — J441 Chronic obstructive pulmonary disease with (acute) exacerbation: Secondary | ICD-10-CM | POA: Diagnosis not present

## 2015-05-24 DIAGNOSIS — Z961 Presence of intraocular lens: Secondary | ICD-10-CM | POA: Diagnosis present

## 2015-05-24 DIAGNOSIS — R8290 Unspecified abnormal findings in urine: Secondary | ICD-10-CM | POA: Diagnosis not present

## 2015-05-24 DIAGNOSIS — G4737 Central sleep apnea in conditions classified elsewhere: Secondary | ICD-10-CM | POA: Diagnosis not present

## 2015-05-24 DIAGNOSIS — Z7902 Long term (current) use of antithrombotics/antiplatelets: Secondary | ICD-10-CM | POA: Diagnosis not present

## 2015-05-24 DIAGNOSIS — Z955 Presence of coronary angioplasty implant and graft: Secondary | ICD-10-CM | POA: Diagnosis not present

## 2015-05-24 DIAGNOSIS — Z9842 Cataract extraction status, left eye: Secondary | ICD-10-CM | POA: Diagnosis not present

## 2015-05-24 DIAGNOSIS — I480 Paroxysmal atrial fibrillation: Secondary | ICD-10-CM | POA: Diagnosis present

## 2015-05-24 DIAGNOSIS — F329 Major depressive disorder, single episode, unspecified: Secondary | ICD-10-CM | POA: Diagnosis not present

## 2015-05-24 DIAGNOSIS — K210 Gastro-esophageal reflux disease with esophagitis: Secondary | ICD-10-CM | POA: Diagnosis not present

## 2015-05-24 DIAGNOSIS — M6281 Muscle weakness (generalized): Secondary | ICD-10-CM | POA: Diagnosis not present

## 2015-05-24 DIAGNOSIS — Z881 Allergy status to other antibiotic agents status: Secondary | ICD-10-CM | POA: Diagnosis not present

## 2015-05-24 DIAGNOSIS — N390 Urinary tract infection, site not specified: Secondary | ICD-10-CM | POA: Diagnosis not present

## 2015-05-24 DIAGNOSIS — H9190 Unspecified hearing loss, unspecified ear: Secondary | ICD-10-CM | POA: Diagnosis present

## 2015-05-24 DIAGNOSIS — G4733 Obstructive sleep apnea (adult) (pediatric): Secondary | ICD-10-CM | POA: Diagnosis present

## 2015-05-24 DIAGNOSIS — D649 Anemia, unspecified: Secondary | ICD-10-CM | POA: Diagnosis present

## 2015-05-24 DIAGNOSIS — Z9861 Coronary angioplasty status: Secondary | ICD-10-CM

## 2015-05-24 DIAGNOSIS — I7092 Chronic total occlusion of artery of the extremities: Secondary | ICD-10-CM | POA: Diagnosis not present

## 2015-05-24 LAB — CBC
HCT: 31.5 % — ABNORMAL LOW (ref 39.0–52.0)
HEMOGLOBIN: 10.5 g/dL — AB (ref 13.0–17.0)
MCH: 32.2 pg (ref 26.0–34.0)
MCHC: 33.3 g/dL (ref 30.0–36.0)
MCV: 96.6 fL (ref 78.0–100.0)
PLATELETS: 450 10*3/uL — AB (ref 150–400)
RBC: 3.26 MIL/uL — AB (ref 4.22–5.81)
RDW: 15.6 % — ABNORMAL HIGH (ref 11.5–15.5)
WBC: 12.4 10*3/uL — AB (ref 4.0–10.5)

## 2015-05-24 LAB — BASIC METABOLIC PANEL
ANION GAP: 8 (ref 5–15)
BUN: 14 mg/dL (ref 6–20)
CALCIUM: 8.5 mg/dL — AB (ref 8.9–10.3)
CO2: 28 mmol/L (ref 22–32)
Chloride: 103 mmol/L (ref 101–111)
Creatinine, Ser: 1.26 mg/dL — ABNORMAL HIGH (ref 0.61–1.24)
GFR, EST AFRICAN AMERICAN: 59 mL/min — AB (ref >60)
GFR, EST NON AFRICAN AMERICAN: 51 mL/min — AB (ref >60)
Glucose, Bld: 97 mg/dL (ref 65–99)
Potassium: 3.7 mmol/L (ref 3.5–5.1)
SODIUM: 139 mmol/L (ref 135–145)

## 2015-05-24 MED ORDER — DEXTROSE 5 % IV SOLN
1.0000 g | INTRAVENOUS | Status: DC
  Administered 2015-05-24 – 2015-05-26 (×3): 1 g via INTRAVENOUS
  Filled 2015-05-24 (×4): qty 10

## 2015-05-24 MED ORDER — ACETAMINOPHEN 500 MG PO TABS
1000.0000 mg | ORAL_TABLET | Freq: Three times a day (TID) | ORAL | Status: DC
  Administered 2015-05-24 – 2015-05-27 (×9): 1000 mg via ORAL
  Filled 2015-05-24 (×10): qty 2

## 2015-05-24 MED ORDER — CHLORHEXIDINE GLUCONATE 0.12 % MT SOLN
15.0000 mL | Freq: Two times a day (BID) | OROMUCOSAL | Status: DC
  Administered 2015-05-25 – 2015-05-26 (×4): 15 mL via OROMUCOSAL
  Filled 2015-05-24 (×7): qty 15

## 2015-05-24 MED ORDER — OXYCODONE HCL 5 MG PO TABS: 5.0000 mg | ORAL_TABLET | Freq: Four times a day (QID) | ORAL | Status: DC | PRN

## 2015-05-24 MED ORDER — CETYLPYRIDINIUM CHLORIDE 0.05 % MT LIQD
7.0000 mL | Freq: Two times a day (BID) | OROMUCOSAL | Status: DC
  Administered 2015-05-26 (×2): 7 mL via OROMUCOSAL

## 2015-05-24 NOTE — Progress Notes (Signed)
PT called me to discuss disposition. Patient would benefit from SNF, and wife/patient are agreeable. Will initiate process. Will also consult IM for delirium. Yexalen Deike PA-C

## 2015-05-24 NOTE — Progress Notes (Signed)
Patient is lying in bed with eyes opened and nonlabored breathing noted. Patient remains alert but confused to place/time/situation. Dressing change performed to left lower leg, patient tolerated well with some complaints of pain. Patient states, "That hurts." Medicated patient for pain. Large bruising noted to right groin site that is soft to touch but tender. Patient is cleaned and repositioned in bed at this time. Call bell is in reach. Bed alarm in place. Will continue to monitor.

## 2015-05-24 NOTE — Progress Notes (Signed)
Came to assess patient, pt appears to be comfortable with no complications or signs of distress noted. RN stated to me that pt does not tolerate his NIV mask well. He pulls it off the moment you try to put it on him.

## 2015-05-24 NOTE — Progress Notes (Signed)
Patient: Gavin [name] / Admit Date: 05/22/2015 / Date of Encounter: 05/24/2015, 7:15 AM   Subjective: Denies any complaints. Not a good historian - confabulates, confused.  Objective: Telemetry: NSR Physical Exam: Blood pressure 151/59, pulse 84, temperature 97.9 F (36.6 C), temperature source Oral, resp. rate 19, height 5\' 8"  (1.727 m), weight 205 lb 4 oz (93.1 kg), SpO2 93 %. General: Well developed, well nourished WM in no acute distress. Head: Normocephalic, atraumatic, sclera non-icteric, no xanthomas, nares are without discharge. Neck: JVP not elevated. Lungs: Clear bilaterally to auscultation without wheezes, rales, or rhonchi. Breathing is unlabored. Heart: RRR S1 S2 without murmurs, rubs, or gallops.  Abdomen: Soft, non-tender, non-distended with normoactive bowel sounds. No rebound/guarding. Extremities: No clubbing or cyanosis. No edema. Wrapped LLE wound. Distal pulses equal bilaterally. Significant ecchymosis noted slightly distal to cath site wrapping around inner thigh and to upper outer thigh, soft; no bruising on his back. No bruit or focal hematoma. Neuro: Alert and oriented to self only. Follows commands.  Psych:  Jovial affect but doesn't always answer questions appropriately (i.e. "Who do you live with?" asked multiple ways -> reply "I am RETIRED!")   Intake/Output Summary (Last 24 hours) at 05/24/15 0715 Last data filed at 05/23/15 1800  Gross per 24 hour  Intake    360 ml  Output    400 ml  Net    -40 ml    Inpatient Medications:  . amLODipine  2.5 mg Oral Daily  . aspirin EC  81 mg Oral Daily  . atorvastatin  40 mg Oral QHS  . clopidogrel  75 mg Oral Q breakfast  . collagenase  1 application Topical Daily  . dicloxacillin  250 mg Oral QID  . DULoxetine  60 mg Oral Daily  . feeding supplement (ENSURE ENLIVE)  237 mL Oral BID BM  . fluticasone  1 spray Each Nare Daily  . furosemide  20 mg Oral Daily  . isosorbide mononitrate  30 mg Oral BID  .  loratadine  10 mg Oral Daily  . metoprolol succinate  25 mg Oral Daily   And  . metoprolol succinate  50 mg Oral QHS  . oxyCODONE-acetaminophen  1 tablet Oral QHS  . pantoprazole  40 mg Oral Daily  . QUEtiapine  50 mg Oral QAC supper  . ranolazine  1,000 mg Oral BID   Infusions:    Labs:  Recent Labs  05/23/15 0227 05/23/15 1453  NA 137 139  K 2.9* 3.2*  CL 103 104  CO2 27 28  GLUCOSE 112* 124*  BUN 10 10  CREATININE 1.14 1.22  CALCIUM 7.6* 7.9*    Recent Labs  05/22/15 1825 05/23/15 0227  WBC 11.3* 10.4  NEUTROABS 8.6*  --   HGB 11.3* 10.3*  HCT 34.1* 31.0*  MCV 96.1 96.3  PLT 440* 384   Radiology/Studies:  No results found.   Assessment and Plan  32M with history of CAD (s/ multiple PCIs, including most recently NSTEMI s/p PCI/DES of the RCA 10/2014), PAD, HTN, HLD, PAF (not on anticoag 2/2 multiple falls), OSA, mild dementia, ICM EF 45-50% in 10/2014 who presented to [facility] 05/22/2015 for planned PV angio.  1. PAD (peripheral artery disease), very remote history of stenting to both SFAs - Angiography showed patent right SFA; crossing of chronic total occlusion with a Viance catheter, PTCA with a chocolate balloon, s/p stenting of distal left SFA and P1 segment of popliteal with a nitinol self-expanding stent - Continue ASA 81mg ,  Plavix, statin  2.LLE wound - Seen by [facility] nurse. Recommended enzymatic debridement ointment now that patient has revascularization. Cover with moist gauze, change daily -> has HHRN at home for wound care - will continue current Abx with dicloxacillin - f/u PCP for this  3. Hypokalemia - s/p repletion yesterday, no labs today - check BMET and likely add daily standing repletion  4. CAD - no angina, continue home regimen  5. Acute on chronic appearing anemia - suspect due to post-cath ecchymosis  - repeat CBC this AM  6. HTN - BP slightly up this AM but has been low at times during hospitalization - continue current regimen and  follow  7. Nitrite positive urinalysis - order urine culture - will discuss rx with MD  8. Baseline dementia - lives with wife - when she arrives will discuss his baseline. Addendum: wife confirms patient is more delirious than usual this admission. ?Contribution from UTI. Will ask IM for assistance  Signed, Burna Mortimer Pager: [phone]   Patient seen and examined. Agree with assessment and plan. No chest pain. Angiography yesterday showed patent right SFA;  chronic total occlusion with a Viance catheter, PTCA with a chocolate balloon, s/p stenting of distal left SFA and P1 segment of popliteal with a nitinol self-expanding stentDiscussed with physical therapist who recommend skilled nursing facility short term. Hospitalist to evaluate today to assist with delirium, leg wound, and placement.  Troy Sine, MD, [facility] 05/24/2015 12:50 PM

## 2015-05-24 NOTE — Progress Notes (Signed)
Initial Nutrition Assessment  DOCUMENTATION CODES:   Obesity unspecified  INTERVENTION:   Continue Ensure Enlive po BID, each supplement provides 350 kcal and 20 grams of protein  Magic cup TID with meals, each supplement provides 290 kcal and 9 grams of protein   NUTRITION DIAGNOSIS:   Inadequate oral intake related to lethargy/confusion as evidenced by meal completion < 50%.   GOAL:   Patient will meet greater than or equal to 90% of their needs   MONITOR:   PO intake, Supplement acceptance, Labs, Weight trends, Skin  REASON FOR ASSESSMENT:   Malnutrition Screening Tool    ASSESSMENT:   Pt with LLE wound due to PAD.  Patient with successful stent placement for critical limb ischemia   Labs reviewed: potassium low at 2.9 now up to 3.7, cbg's: 97-124 Per chart review weight has been steady between 190-205 lb.  Pt confused lives with wife who is not present.  Per staff pt's PO intake has been very poor.  Nutrition-Focused physical exam completed. Findings are no fat depletion, no muscle depletion, and mild edema.    Diet Order:  Diet Heart Room service appropriate?: Yes; Fluid consistency:: Thin  Skin:  Wound (see comment) (LLE wound)  Last BM:  8/29  Height:   Ht Readings from Last 1 Encounters:  05/22/15 5\' 8"  (1.727 m)    Weight:   Wt Readings from Last 1 Encounters:  05/24/15 205 lb 4 oz (93.1 kg)    Ideal Body Weight:  70 kg  BMI:  Body mass index is 31.22 kg/(m^2).  Estimated Nutritional Needs:   Kcal:  1800-2000  Protein:  115-130 grams  Fluid:  > 1.8 L/day  EDUCATION NEEDS:   No education needs identified at this time  [place], [place], [place] Pager [phone] After Hours Pager

## 2015-05-24 NOTE — Evaluation (Signed)
Physical Therapy Evaluation Patient Details Name: Gavin [name] MRN: [id number] DOB: 03/07/1929 Today's Date: 05/24/2015   History of Present Illness  52M with history of CAD (s/ multiple PCIs, including most recently NSTEMI s/p PCI/DES of the RCA 10/2014), PAD, HTN, HLD, PAF (not on anticoag 2/2 multiple falls), OSA, mild dementia, ICM EF 45-50% in 10/2014 who presented to [facility] 05/22/2015 for planned PV angio. - Angiography showed patent right SFA; crossing of chronic total occlusion with a Viance catheter, PTCA with a chocolate balloon, s/p stenting of distal left SFA and P1 segment of popliteal with a nitinol self-expanding stent  Clinical Impression   Pt admitted with above diagnosis. Pt currently with functional limitations due to the deficits listed below (see PT Problem List).  Pt will benefit from skilled PT to increase their independence and safety with mobility to allow discharge to the venue listed below.       Follow Up Recommendations SNF    Equipment Recommendations  3in1 (PT)    Recommendations for Other Services Other (comment) Airline pilot and Social Work (Thanks!))     Precautions / Restrictions Precautions Precautions: Fall      Mobility  Bed Mobility Overal bed mobility: Needs Assistance Bed Mobility: Rolling;Sidelying to Sit;Sit to Supine Rolling: Mod assist Sidelying to sit: Max assist   Sit to supine: Total assist   General bed mobility comments: Heavy mod assist and use of bed pad for rolling, hand over hand assist to reach for rails; support given at upper trunk and pelvic girdle to elevate trunk to sitting; Total assist to return to supine  Transfers Overall transfer level: Needs assistance Equipment used:  (Gait belt) Transfers: Sit to/from Stand Sit to Stand: Max assist         General transfer comment: Max assist to reach standing position; pt had a relatively good weight bearing response to his center of mass being shift over feet, and he did  recruit LE musculature to assist in push to stand; Significant posterior lean, however, and he required max assist to translate weight forward, and ultimately to prevent uncontrolled descent back to bed  Ambulation/Gait             General Gait Details: Unable  Stairs            Wheelchair Mobility    Modified Rankin (Stroke Patients Only)       Balance Overall balance assessment: Needs assistance Sitting-balance support: Feet supported;Bilateral upper extremity supported Sitting balance-Leahy Scale: Zero Sitting balance - Comments: TEnding to drift into posterior lean; manual cueing and facilitation to keep weight forward for upright sitting Postural control: Posterior lean                                   Pertinent Vitals/Pain Pain Assessment: Faces Faces Pain Scale: Hurts little more Pain Location: Grimace can also be related to exertion, and not necessarily pain Pain Intervention(s): Monitored during session;Repositioned    Home Living Family/patient expects to be discharged to:: Private residence Living Arrangements: Spouse/significant other Available Help at Discharge: Family;Available 24 hours/day Type of Home: House Home Access: Stairs to enter   [employer] of Steps: 3 Home Layout: One level Home Equipment: Walker - 2 wheels;Cane - single point;Shower seat      Prior Function Level of Independence: Needs assistance   Gait / Transfers Assistance Needed: ambulated with rollator  ADL's / Homemaking Assistance Needed: Assistance from wife  with bathing; ?dresses self at baseline?        Hand Dominance   Dominant Hand: Right    Extremity/Trunk Assessment   Upper Extremity Assessment: Generalized weakness           Lower Extremity Assessment: Generalized weakness;LLE deficits/detail   LLE Deficits / Details: Wound at lower leg with dressing     Communication   Communication: HOH;Expressive difficulties   Cognition Arousal/Alertness: Lethargic Behavior During Therapy: Restless Overall Cognitive Status: Impaired/Different from baseline Area of Impairment: Attention;Memory;Following commands;Safety/judgement   Current Attention Level: Focused Memory: Decreased short-term memory (with history of dementia) Following Commands: Follows one step commands inconsistently;Follows one step commands with increased time Safety/Judgement: Decreased awareness of safety;Decreased awareness of deficits     General Comments: Eyes closed for approx 40 % of session; Interactions similar to what Dayna, Cardiology PA experienced this am    General Comments General comments (skin integrity, edema, etc.): Discussed case with RN, PA, and Cardiologist    Exercises        Assessment/Plan    PT Assessment Patient needs continued PT services  PT Diagnosis Difficulty walking;Generalized weakness (AMS)   PT Problem List Decreased strength;Decreased range of motion;Decreased activity tolerance;Decreased balance;Decreased mobility;Decreased coordination;Decreased cognition;Decreased knowledge of use of DME;Decreased safety awareness;Decreased knowledge of precautions;Pain;Decreased skin integrity  PT Treatment Interventions DME instruction;Gait training;Stair training;Functional mobility training;Therapeutic activities;Therapeutic exercise;Balance training;Patient/family education   PT Goals (Current goals can be found in the Care Plan section) Acute Rehab PT Goals Patient Stated Goal: unable to state PT Goal Formulation: With family Time For Goal Achievement: 06/07/15 Potential to Achieve Goals: Good    Frequency Min 3X/week   Barriers to discharge        Co-evaluation               End of Session Equipment Utilized During Treatment: Gait belt Activity Tolerance: Other (comment) (limited by apparent delirium) Patient left: in bed;with family/visitor present;with nursing/sitter in room Nurse  Communication: Mobility status    Functional Assessment Tool Used: Clinical Judgement Functional Limitation: Mobility: Walking and moving around Mobility: Walking and Moving Around Current Status (H8299): At least 60 percent but less than 80 percent impaired, limited or restricted Mobility: Walking and Moving Around Goal Status [phone]): At least 1 percent but less than 20 percent impaired, limited or restricted    Time: 1210-1233 PT Time Calculation (min) (ACUTE ONLY): 23 min   Charges:   PT Evaluation $Initial PT Evaluation Tier I: 1 Procedure PT Treatments $Therapeutic Activity: 8-22 mins   PT G Codes:   PT G-Codes **NOT FOR INPATIENT CLASS** Functional Assessment Tool Used: Clinical Judgement Functional Limitation: Mobility: Walking and moving around Mobility: Walking and Moving Around Current Status (C7893): At least 60 percent but less than 80 percent impaired, limited or restricted Mobility: Walking and Moving Around Goal Status [phone]): At least 1 percent but less than 20 percent impaired, limited or restricted    Roney Hattie [facility] 05/24/2015, 1:06 PM  Roney Ghali, [place] Pager [phone] Office [phone]

## 2015-05-24 NOTE — Consult Note (Signed)
[facility] History and Physical                                                                                    Gavin [name], is a 81 y.o. male  MRN: [id number]   DOB - 05-24-1929  Admit Date - 05/22/2015  Outpatient Primary MD for the patient is Ann Held, MD  Requesting MD: Claiborne Billings / Cardiology  Reason for consultation: Altered mental status  Recommendations: 1) continue current Cymbalta, Seroquel. With regard to pain management would recommend scheduling Tylenol 1,000 TID and using Oxycodone for severe breakthrough pain.  2) Possible UTI contributing, initiated empiric Rocephin 3) discussed with wife need to bring familiar items from home to help reorient patient 4) recommend mobilize patient as much as tolerated; if does not tolerate actual walking out of bed to chair 3 times a day for at least 1-2 hours per episode would be appropriate to begin with 5) encourage use of nocturnal CPAP and Percocet to ensure adequate REM sleep 6) patient was found to not be orthostatic and was actually hypertensive upon standing and became dizzy and diaphoretic which seems more consistent with deconditioning-I have ordered room air ambulatory pulse oximetry to determine if requires oxygen with ambulation  With History of -  Past Medical History  Diagnosis Date  . Lung nodules     CT 07-01-10 stable @ [place]  . Reflux esophagitis   . GERD (gastroesophageal reflux disease)   . Hypertension   . Hyperlipidemia   . PVD (peripheral vascular disease) 2004    stenting of both SFA's 2004, critical limb ischemia  . Carotid disease, bilateral     left greater than right  . CAD (coronary artery disease)     a. 2007 STEMI: stent to LAD;  b. multiple PCI's. c. 10/2014 NSTEMI/PCI: LM nl, LAD 125m, D1 50ost, LCX nl/small, RCA 56m (3.0x20 Promus DES), EF 45-50% (this admission is under MR # [id number])  . PAF (paroxysmal atrial fibrillation)   . Allergy   . Anemia   . Depression   . CHF  (congestive heart failure)   . Anginal pain   . Myocardial infarction     "he's had 4; last one was 11/2014" (05/22/2015)  . DVT (deep venous thrombosis) ~ 1980    "in one of his legs; was in ICU for some time"  . Pneumonia ~ 2013  . OSA (obstructive sleep apnea)     NPSG 05-05-99 RDI 65/hr-CPAP  . OSA on CPAP   . Daily headache     negative work up, no change off Imdur  . Arthritis     "all over"  . Chronic back pain     "all over"      Past Surgical History  Procedure Laterality Date  . Left heart catheterization with coronary angiogram N/A 11/14/2014    Procedure: LEFT HEART CATHETERIZATION WITH CORONARY ANGIOGRAM;  Surgeon: Blane Ohara, MD;  Location: [facility] CATH LAB;  Service: Cardiovascular;  Laterality: N/A;  . Shoulder open rotator cuff repair Right 1980's  . Appendectomy    . Tonsillectomy    . Coronary angioplasty with stent placement  06/13/2004  successful cutting balloon atherectomy/PIi & STENTING mid LAD & POBA of the distal LAD  . Coronary angioplasty with stent placement  06/03/2006    successful PTCA & stent mid LAD  . Coronary angioplasty with stent placement  06/06/2006    successful double stenting of the ostial and prox RCA, successful PTCA LAD of the ostium major diag branch due to jailing by a previously placed LAD stent,successful mid kiagonal branch primary stenting  . Coronary angioplasty with stent placement  09/20/2008    PTCA LAD stent  . Cardiac catheterization  10/03/2008    Occluded LAD  . US echocardiography  12/24/2011    trace MR,TR,AI,PI. EF >55%  . Nm myoview ltd  08/01/2011    Abnormal-intermed to high risk based upon per-infarct ischemia  . Carotid doppler  05/29/2012    bilateral ICA stenosis  . Cardiac catheterization  06/01/13    medical Rx  . Left heart catheterization with coronary angiogram N/A 06/01/2013    Procedure: LEFT HEART CATHETERIZATION WITH CORONARY ANGIOGRAM;  Surgeon: Leonie Man, MD;  Location: [facility] CATH LAB;  Service:  Cardiovascular;  Laterality: N/A;  . Peripheral vascular catheterization N/A 05/22/2015    Procedure: Lower Extremity Angiography;  Surgeon: Lorretta Harp, MD;  Location: [facility] CV LAB;  Service: Cardiovascular;  Laterality: N/A;  . Cataract extraction w/ intraocular lens  implant, bilateral Bilateral 2015    in for planned peripheral vascular angiography on 8/29    HPI This is an 81 year old male patient history of coronary artery disease with prior NSTEMI/coronary stents, paroxysmal atrial fibrillation, peripheral arterial disease status post history of stenting to both SFAs, dyslipidemia, chronic anticoagulation, COPD, dementia and obstructive sleep apnea who was admitted on 8/29 to undergo peripheral vascular angiography. He subsequently underwent PTCA and stenting of distal left SFA and P1 segment popliteal. Since that time the patient has developed increasing confusion. Patient has not started any new medications since admission. He has not always been compliant with his nocturnal CPAP in's admission. A recent urinalysis is concerning for acute UTI noting he does have leukocytosis. He is also developed weakness and postural hypotension in the past 24 hours. He is not had any focal neurological signs or TIA/stroke type symptoms. We have been asked to evaluate the patient for possible acute delirium. He is not started any new medications prior to admission or since admission and has had any changes in his baseline medications from home-specifically his pain medications and his dementia medications.   Review of Systems   In addition to the HPI above, **patient very confused and unable to contribute to history therefore history obtained from wife at bedside  No Fever-chills, myalgias or other constitutional symptoms No Headache, changes with Vision or hearing, new focal weakness, tingling, numbness in any extremity; at baseline patient is mildly confused and does have mild sundowning-type  syndrome at home No problems swallowing food or Liquids, indigestion/reflux No Chest pain, Cough or Shortness of Breath, palpitations, orthopnea or DOE No Abdominal pain, N/V; no melena or hematochezia, no dark tarry stools, Bowel movements are regular, No dysuria, hematuria or flank pain;at baseline at home patient voids frequently and has to get up during the night (nocturia) No new skin rashes, lesions, masses or bruises, No new joints pains-aches No recent weight gain or loss No polyuria, polydypsia or polyphagia,  *A full 10 point Review of Systems was done, except as stated above, all other Review of Systems were negative.  Social History Social History  Substance  Use Topics  . Smoking status: Former Smoker -- 0.75 packs/day for 20 years    Types: Cigarettes    Quit date: 09/23/1973  . Smokeless tobacco: Former Systems developer    Types: Snuff, Chew     Comment: tried snuff and chew as a child (63yr) did not continue  . Alcohol Use: Yes     Comment: 05/22/2015 "seldom-- every three or four years"    Resides at: private residence  Lives with: wife  Ambulatory status: rollator   Family History Family History  Problem Relation Age of Onset  . Heart attack Father   . Heart failure Mother   . Stroke Maternal Grandmother      Prior to Admission medications   Medication Sig Start Date End Date Taking? Authorizing Provider  acetaminophen (TYLENOL) 500 MG tablet Take 1,000 mg by mouth every 6 (six) hours as needed for moderate pain.   Yes Historical Provider, MD  albuterol (PROVENTIL HFA;VENTOLIN HFA) 108 (90 BASE) MCG/ACT inhaler Inhale into the lungs every 6 (six) hours as needed for wheezing or shortness of breath. ProAir   Yes Historical Provider, MD  amLODipine (NORVASC) 2.5 MG tablet Take 1 tablet (2.5 mg total) by mouth daily. 02/07/15  Yes Lorretta Harp, MD  aspirin EC 81 MG tablet Take 81 mg by mouth at bedtime.    Yes Historical Provider, MD  atorvastatin (LIPITOR) 40 MG  tablet Take 1 tablet (40 mg total) by mouth at bedtime. 12/08/14  Yes Lorretta Harp, MD  clopidogrel (PLAVIX) 75 MG tablet Take 1 tablet (75 mg total) by mouth daily. 02/07/15  Yes Lorretta Harp, MD  dicloxacillin (DYNAPEN) 250 MG capsule Take 250 mg by mouth 4 (four) times daily. For 10 days   Yes Historical Provider, MD  DULoxetine (CYMBALTA) 60 MG capsule Take 60 mg by mouth daily. 11/09/14  Yes Historical Provider, MD  fexofenadine (ALLEGRA) 180 MG tablet Take 180 mg by mouth daily.   Yes Historical Provider, MD  furosemide (LASIX) 40 MG tablet Take 20-40 mg by mouth daily. Take 1/2 tablet to 1 tablet daily depending on swelling. 09/19/14  Yes Historical Provider, MD  isosorbide mononitrate (IMDUR) 30 MG 24 hr tablet Take 30 mg by mouth 2 (two) times daily.  11/09/14  Yes Historical Provider, MD  Ketotifen Fumarate (ALAWAY OP) Place 1 drop into both eyes 2 (two) times daily as needed (dry eyes).   Yes Historical Provider, MD  lidocaine (XYLOCAINE) 5 % ointment Apply 1 application topically daily as needed. Apply to lower leg on skin around wound, not to place in wound   Yes Historical Provider, MD  Menthol, Topical Analgesic, (BIOFREEZE EX) Apply 1 application topically daily as needed (pain).   Yes Historical Provider, MD  metolazone (ZAROXOLYN) 2.5 MG tablet Take 2.5 mg by mouth every other day as needed (for fluid). Every other day   Yes Historical Provider, MD  metoprolol succinate (TOPROL-XL) 50 MG 24 hr tablet Take 25-50 mg by mouth daily. Take 25 mg in the morning and 50 mg in the evening.   Yes Historical Provider, MD  mirtazapine (REMERON) 15 MG tablet Take 15 mg by mouth at bedtime. 03/02/15  Yes Historical Provider, MD  Multiple Vitamin (MULTIVITAMIN WITH MINERALS) TABS tablet Take 1 tablet by mouth daily.   Yes Historical Provider, MD  Multiple Vitamin (MULTIVITAMIN) tablet Take 1 tablet by mouth daily.   Yes Historical Provider, MD  Multiple Vitamins-Minerals (PRESERVISION AREDS PO)  Take 1 tablet by mouth 2 (  two) times daily.   Yes Historical Provider, MD  NAMZARIC 28-10 MG CP24 Take 1 capsule by mouth daily. 02/06/15  Yes Historical Provider, MD  NASONEX 50 MCG/ACT nasal spray Place 2 sprays into the nose 2 (two) times daily.  08/20/11  Yes Historical Provider, MD  nitroGLYCERIN (NITROSTAT) 0.4 MG SL tablet Place 0.4 mg under the tongue every 5 (five) minutes as needed for chest pain.   Yes Historical Provider, MD  oxyCODONE-acetaminophen (PERCOCET/ROXICET) 5-325 MG per tablet Take 1 tablet by mouth at bedtime. 05/12/15  Yes Historical Provider, MD  pantoprazole (PROTONIX) 40 MG tablet take 1 tablet by mouth once daily 11/02/14  Yes Lorretta Harp, MD  QUEtiapine (SEROQUEL) 50 MG tablet Take 50 mg by mouth daily before supper.  11/02/14  Yes Historical Provider, MD  RANEXA 1000 MG SR tablet Take 1,000 mg by mouth 2 (two) times daily. 05/09/15  Yes Historical Provider, MD  SANTYL ointment Apply 1 application topically daily. 05/12/15  Yes Historical Provider, MD    Allergies  Allergen Reactions  . Ciprofloxacin     unknown  . Ciprofloxacin Other (See Comments)    Unknown allergic reaction, possible shortness of breath  . Codeine     "knocked me out"  . Codeine Other (See Comments)    "knocked me out"  . Dexamethasone     unknown  . Dexamethasone Other (See Comments)    Unknown allergic reaction  . Sulfa Antibiotics Nausea And Vomiting  . Sulfonamide Derivatives     Unknown reaction  . Telmisartan     unknown  . Telmisartan Other (See Comments)    Micardis - heart races    Physical Exam  Vitals  Blood pressure 106/54, pulse 81, temperature 97.7 F (36.5 C), temperature source Oral, resp. rate 20, height 5\' 8"  (1.727 m), weight 205 lb 4 oz (93.1 kg), SpO2 95 %.   General:  In mild distress as evidenced by ongoing confusion  Psych:  flat affect, drowsy but awakens easily, oriented to name only, makes limited eye contact during conversation, unable to be  redirected or be oriented, is not aggressive or agitated  Neuro:   No focal neurological deficits, CN II through XII intact, Strength 5/5 all 4 extremities, Sensation intact all 4 extremities.  ENT:  Ears and Eyes appear Normal, Conjunctivae clear, PER. Moist oral mucosa without erythema or exudates.  Neck:  Supple, No lymphadenopathy appreciated  Respiratory:  Symmetrical chest wall movement, Good air movement bilaterally, CTAB. Room Air  Cardiac:  RRR, No Murmurs, no LE edema noted, no JVD, No carotid bruits, peripheral pulses palpable at 2+  Abdomen:  Positive bowel sounds, Soft, Non tender, Non distended,  No masses appreciated, no obvious hepatosplenomegaly  Skin:  No Cyanosis, Normal Skin Turgor, No Skin Rash or Bruise.  Extremities: Symmetrical without obvious trauma or injury,  no effusions.  Data Review  CBC  Recent Labs Lab 05/17/15 1551 05/22/15 1825 05/23/15 0227 05/24/15 0750  WBC 11.5* 11.3* 10.4 12.4*  HGB 12.6* 11.3* 10.3* 10.5*  HCT 36.7* 34.1* 31.0* 31.5*  PLT 503* 440* 384 450*  MCV 94.8 96.1 96.3 96.6  MCH 32.6 31.8 32.0 32.2  MCHC 34.3 33.1 33.2 33.3  RDW 15.6* 15.4 15.5 15.6*  LYMPHSABS  --  1.4  --   --   MONOABS  --  1.2*  --   --   EOSABS  --  0.1  --   --   BASOSABS  --  0.0  --   --  Chemistries   Recent Labs Lab 05/17/15 1551 05/23/15 0227 05/23/15 1453 05/24/15 0750  NA 143 137 139 139  K 3.7 2.9* 3.2* 3.7  CL 98 103 104 103  CO2 32* 27 28 28   GLUCOSE 100* 112* 124* 97  BUN 23 10 10 14   CREATININE 1.39* 1.14 1.22 1.26*  CALCIUM 9.0 7.6* 7.9* 8.5*    estimated creatinine clearance is 48.3 mL/min (by C-G formula based on Cr of 1.26).  No results for input(s): TSH, T4TOTAL, T3FREE, THYROIDAB in the last 72 hours.  Invalid input(s): FREET3  Coagulation profile  Recent Labs Lab 05/17/15 1551  INR 1.22    No results for input(s): DDIMER in the last 72 hours.  Cardiac Enzymes No results for input(s): CKMB,  TROPONINI, MYOGLOBIN in the last 168 hours.  Invalid input(s): CK  Invalid input(s): POCBNP  Urinalysis    Component Value Date/Time   COLORURINE ORANGE* 05/23/2015 [place] 05/23/2015 1650   LABSPEC 1.020 05/23/2015 1650   PHURINE 5.5 05/23/2015 1650   GLUCOSEU NEGATIVE 05/23/2015 1650   HGBUR NEGATIVE 05/23/2015 1650   BILIRUBINUR SMALL* 05/23/2015 1650   KETONESUR NEGATIVE 05/23/2015 1650   PROTEINUR 100* 05/23/2015 1650   UROBILINOGEN 1.0 05/23/2015 1650   NITRITE POSITIVE* 05/23/2015 1650   LEUKOCYTESUR TRACE* 05/23/2015 1650    Imaging results:   No results found.   Assessment & Plan  Principal Problem:   Acute delirium in setting of known Dementia -etiology likely multifactorial in nature: Out of familiar home surroundings for greater than 48 hours, possible acute UTI, incomplete sleep pattern as evidenced by noncompliance with CPAP at bedtime-was not orthostatic when checked but actually became hypertensive with associated dizziness diaphoresis itches most consistent with deconditioning -His wife states that symptoms becoming worse after administering Percocet -For pain management would recommend scheduling Tylenol 1000 mg by mouth 3 times a day, and provide oxycodone 5 mg every 6 hours as needed for severe breakthrough pain. -no focal neurological deficits and no indication to pursue CT of the head at this point -Primary plan is to treat underlying issues reversible causes including increased mobility as tolerated, promote appropriate nocturnal sleep hygiene and bring familiar items from home; plan to also encourage familiar activities at home for example bring books for patient to read since this is one of his favorite at home behaviors -continue current Cymbalta, Seroquel -continue PT/OT; current recommendation from them is to pursue SNF has a history of recurrent falls at home -Check orthostatic vital signs-if positive recommend at least push oral  fluids and hold diuretic therapies-consider when necessary bolus of IV fluids as last resort  Active Problems:   Abnormal urinalysis -appears consistent with UTI patient does have leukocytosis -Follow up on culture -Begin empiric antibiotic and monitor for improvement in mental status within the next 48 hours    Obstructive sleep apnea -Continue to encourage utilization of CPAP at hour of sleep; will ensure patient has undisturbed REM sleep and will minimize issues related to confusion and delirium    COPD -Currently stable and without any wheezing    PAF (paroxysmal atrial fibrillation) -Management per primary team    PAD (peripheral artery disease), history of stenting to both SFAs -management per primary team    HLD (hyperlipidemia) -Management per primary team    CAD S/P percutaneous coronary angioplasty -Management per primary team    Family Communication:    Wife at bedside  Code Status:  Full code  Condition:  stable  Discharge  disposition: at discretion of primary team; currently PT recommending skilled nursing facility  Time spent in minutes : 60      ELLIS,ALLISON L. ANP on 05/24/2015 at 2:20 PM  Between 7am to 7pm - Pager - [phone]  After 7pm go to [email or web address] - password [email or web address]  And look for the night coverage person covering me after hours  [facility]  Addendum I personally evaluated patient on 05/24/2015 and agree with the above findings. Patient is an 81 year old gentleman with an established history of coronary artery disease, dementia, peripheral arterial disease, admitted to the cardiology service on 05/22/2015 undergoing angiography. He underwent stenting of left SFA and P1 segment popliteal. Hospitalization was complicated by increasing confusion and agitation. At baseline he requires prompting for performing activities of daily living and does require assistance with bathing and dressing. He is able to recognize family members.  He currently resides at home with his wife. During my encounter is easily distractible, fidgeting with the IV, having significant confusion. Clinical presentation highly suggestive of delirium as this was likely brought on by multiple factors including hospitalization, history of underlying dementia, possible urinary tract infection. His wife feels that symptoms tend to get worse after taking Percocet. Would recommend managing pain with scheduled Tylenol 1000 mg by mouth 3 times a day and providing as needed oxycodone 5 mg for severe breakthrough pain. Would recommend continuing Seroquel daily at bedtime. Physical therapy consult.

## 2015-05-24 NOTE — Progress Notes (Addendum)
Attempted to obtain orthostatic vital signs on patient. Patient was able to follow directions but not able to stand for a long period of time. Patient stands for three minutes, states he is tired and dizzy, becomes diaphoretic, assist patient to a sitting position. Patient remains alert, blood pressure increased. Erin Hearing NP informed and states to monitor blood pressure and if remains elevated to treat per cardiology orders. Will monitor. Patient's wife is at bedside. IV to left wrist noted out, removed and new IV started to right wrist, patient tolerated well. Blood pressure improved and is 101/54 at this time. Patient voices no complaints and no distress noted at this time.

## 2015-05-25 ENCOUNTER — Encounter (HOSPITAL_COMMUNITY): Payer: Self-pay | Admitting: Physician Assistant

## 2015-05-25 LAB — CBC
HCT: 31.6 % — ABNORMAL LOW (ref 39.0–52.0)
HEMOGLOBIN: 10.3 g/dL — AB (ref 13.0–17.0)
MCH: 31.6 pg (ref 26.0–34.0)
MCHC: 32.6 g/dL (ref 30.0–36.0)
MCV: 96.9 fL (ref 78.0–100.0)
PLATELETS: 465 10*3/uL — AB (ref 150–400)
RBC: 3.26 MIL/uL — AB (ref 4.22–5.81)
RDW: 15.5 % (ref 11.5–15.5)
WBC: 9.6 10*3/uL (ref 4.0–10.5)

## 2015-05-25 LAB — BASIC METABOLIC PANEL
ANION GAP: 9 (ref 5–15)
BUN: 14 mg/dL (ref 6–20)
CHLORIDE: 99 mmol/L — AB (ref 101–111)
CO2: 32 mmol/L (ref 22–32)
CREATININE: 1.26 mg/dL — AB (ref 0.61–1.24)
Calcium: 8.7 mg/dL — ABNORMAL LOW (ref 8.9–10.3)
GFR calc non Af Amer: 51 mL/min — ABNORMAL LOW (ref >60)
GFR, EST AFRICAN AMERICAN: 59 mL/min — AB (ref >60)
Glucose, Bld: 99 mg/dL (ref 65–99)
Potassium: 3.5 mmol/L (ref 3.5–5.1)
SODIUM: 140 mmol/L (ref 135–145)

## 2015-05-25 LAB — URINE CULTURE: Culture: 6000

## 2015-05-25 MED ORDER — POTASSIUM CHLORIDE CRYS ER 20 MEQ PO TBCR
20.0000 meq | EXTENDED_RELEASE_TABLET | Freq: Every day | ORAL | Status: DC
  Administered 2015-05-26 – 2015-05-27 (×2): 20 meq via ORAL
  Filled 2015-05-25 (×2): qty 1

## 2015-05-25 MED ORDER — FLUTICASONE PROPIONATE 50 MCG/ACT NA SUSP
1.0000 | Freq: Every day | NASAL | Status: DC
  Administered 2015-05-25 – 2015-05-26 (×2): 1 via NASAL
  Filled 2015-05-25: qty 16

## 2015-05-25 MED ORDER — POTASSIUM CHLORIDE CRYS ER 20 MEQ PO TBCR
40.0000 meq | EXTENDED_RELEASE_TABLET | Freq: Once | ORAL | Status: AC
  Administered 2015-05-25: 40 meq via ORAL
  Filled 2015-05-25: qty 2

## 2015-05-25 NOTE — Care Management Important Message (Addendum)
Important Message  Patient Details  Name: Gavin [name] MRN: [id number] Date of Birth: 1928-10-04   Medicare Important Message Given:  Yes-second notification given    Erenest Rasher, RN 05/25/2015, 5:25 PM

## 2015-05-25 NOTE — Progress Notes (Signed)
Patient: Gavin [name] / Admit Date: 05/22/2015 / Date of Encounter: 05/25/2015, 9:41 AM   Subjective: Denies any complaints. Not a good historian - confabulates, confused.  Objective: Telemetry: NSR Physical Exam: Blood pressure 166/51, pulse 80, temperature 98.1 F (36.7 C), temperature source Oral, resp. rate 14, height 5\' 8"  (1.727 m), weight 205 lb 4 oz (93.1 kg), SpO2 100 %. General: Pale, elderly male, frail- needed two people to assist him to bed. up in recliner, NAD Head: Normocephalic, atraumatic, sclera non-icteric, no xanthomas, nares are without discharge. Neck: JVP not elevated. Lungs: Clear bilaterally to auscultation without wheezes, rales, or rhonchi. Breathing is unlabored. Heart: RRR S1 S2 without murmurs, rubs, or gallops.  Abdomen: Soft, non-tender, non-distended with normoactive bowel sounds. No rebound/guarding. Extremities: Significant amount of ecchymosis RFA site. Not pulsatile.  No bruit.  Neuro: Alert and oriented to self only. Follows commands.  Psych:  Jovial affect but doesn't always answer questions appropriately    Intake/Output Summary (Last 24 hours) at 05/25/15 0941 Last data filed at 05/24/15 1508  Gross per 24 hour  Intake     50 ml  Output      0 ml  Net     50 ml    Inpatient Medications:  . acetaminophen  1,000 mg Oral TID  . amLODipine  2.5 mg Oral Daily  . antiseptic oral rinse  7 mL Mouth Rinse q12n4p  . aspirin EC  81 mg Oral Daily  . atorvastatin  40 mg Oral QHS  . cefTRIAXone (ROCEPHIN)  IV  1 g Intravenous Q24H  . chlorhexidine  15 mL Mouth Rinse BID  . clopidogrel  75 mg Oral Q breakfast  . collagenase  1 application Topical Daily  . dicloxacillin  250 mg Oral QID  . DULoxetine  60 mg Oral Daily  . feeding supplement (ENSURE ENLIVE)  237 mL Oral BID BM  . fluticasone  1 spray Each Nare Daily  . furosemide  20 mg Oral Daily  . isosorbide mononitrate  30 mg Oral BID  . loratadine  10 mg Oral Daily  . metoprolol succinate  25  mg Oral Daily   And  . metoprolol succinate  50 mg Oral QHS  . pantoprazole  40 mg Oral Daily  . QUEtiapine  50 mg Oral QAC supper  . ranolazine  1,000 mg Oral BID   Infusions:    Labs:  Recent Labs  05/24/15 0750 05/25/15 0404  NA 139 140  K 3.7 3.5  CL 103 99*  CO2 28 32  GLUCOSE 97 99  BUN 14 14  CREATININE 1.26* 1.26*  CALCIUM 8.5* 8.7*    Recent Labs  05/22/15 1825  05/24/15 0750 05/25/15 0404  WBC 11.3*  < > 12.4* 9.6  NEUTROABS 8.6*  --   --   --   HGB 11.3*  < > 10.5* 10.3*  HCT 34.1*  < > 31.5* 31.6*  MCV 96.1  < > 96.6 96.9  PLT 440*  < > 450* 465*  < > = values in this interval not displayed. Radiology/Studies:  No results found.   Assessment and Plan  55M with history of CAD (s/ multiple PCIs, including most recently NSTEMI s/p PCI/DES of the RCA 10/2014), PAD, HTN, HLD, PAF (not on anticoag 2/2 multiple falls), OSA, mild dementia, ICM EF 45-50% in 10/2014 who presented to [facility] 05/22/2015 for planned PV angio.  1. PAD, remote history of stenting to both SFAs - Angiography showed patent right SFA with  chronic total occlusion of Lt SFA. Treated with PTA with a chocolate balloon and stenting of distal left SFA and P1 segment of popliteal vessel.  - Continue ASA 81mg , Plavix, statin  2.LLE wound - Seen by [facility] nurse. Recommended enzymatic debridement ointment now that patient has revascularization. Cover with moist gauze, change daily -> has HHRN at home for wound care - will continue current Abx with dicloxacillin - f/u PCP for this  3. Hypokalemia- K+ 3.5 today, will start daily K+  4. CAD - no angina, continue home regimen  5. Acute on chronic appearing anemia - suspect due to post-cath ecchymosis  - HGb stable  6. HTN - BP slightly up this AM but has been low at times during hospitalization - continue current regimen and follow  7. Nitrite positive urinalysis -  urine culture ordered, pt on Rocephin  8. Baseline dementia  9. Overall  severely debilitated  Plan: Pt will need SNF. Process initiated by Melina Copa PA yesterday. 40 meq KCL today, start 20 meq daily in am.    [name] PA-C 05/25/2015 9:55 AM     Patient seen and examined. Agree with assessment and plan. No chest pain.  Ecchymosis R groin. F/U cbc,bmet. FL2 form signed.   Troy Sine, MD, [facility] 05/25/2015 10:31 AM

## 2015-05-25 NOTE — Clinical Social Work Note (Signed)
Clinical Social Work Assessment  Patient Details  Name: Gavin [name] MRN: [id number] Date of Birth: Jan 31, 1929  Date of referral:  05/24/15               Reason for consult:  Facility Placement                Permission sought to share information with:  Family Supports (Patient disoriented to person, place and situation. CSW contacted wife, Gavin [name]) Permission granted to share information::  Yes, Verbal Permission Granted  Name::     Gavin [name]  Agency::     Relationship::  Spouse  Contact Information:  [phone]  Housing/Transportation Living arrangements for the past 2 months:  Single Family Home Source of Information:  Spouse Patient Interpreter Needed:  None Criminal Activity/Legal Involvement Pertinent to Current Situation/Hospitalization:  No - Comment as needed Significant Relationships:  Spouse Lives with:  Spouse Do you feel safe going back to the place where you live?  Yes Need for family participation in patient care:  Yes (Comment)  Care giving concerns: Gavin [name] expressed to [place] her concern about caring for patient at home, as he needs more help than she can provide.   Social Worker assessment / plan:  CSW talked by phone with Gavin [name] about discharge planning and recommendation of short-term rehab. Gavin [name] was pleasant and receptive to talking with CSW about discharge plan and is in agreement with ST rehab. Her facility prefernce is [facility] in [place] and [facility] is her second choice. CSW and Gavin [name] also talked about services patient has received in the home and he has had [facility] services previously.  Employment status:  Retired Health visitor, Managed Care PT Recommendations:  [place] / Referral to community resources:  Other (Comment Required) (None needed or requested at this time)  Patient/Family's Response to care: Gavin [name] did not express any concerns regarding patients care at the  hospital.  Patient/Family's Understanding of and Emotional Response to Diagnosis, Current Treatment, and Prognosis:  Not discussed.  Emotional Assessment Appearance:  Appears stated age Attitude/Demeanor/Rapport:  Lethargic, Other (Confused) Affect (typically observed):  Calm, Pleasant Orientation:   (Patient disoriented to self, place and situation per nurse report.) Alcohol / Substance use:  Tobacco Use, Alcohol Use (Patient reports that he quit smoking in 1975. Patient  drinks sporadically, every  3 to 4 years per patient report. Patient does not use illicit drugs.) Psych involvement (Current and /or in the community):  No (Comment)  Discharge Needs  Concerns to be addressed:  Discharge Planning Concerns Readmission within the last 30 days:  No Current discharge risk:  Dependent with Mobility (Patient unable to ambulate and max assist with transfers) Barriers to Discharge:  No Barriers Identified   Gavin Feil, LCSW 05/25/2015, 12:19 PM

## 2015-05-25 NOTE — Progress Notes (Signed)
Discussed with nurse about patient wearing CPAP at night. Patient has being removing CPAP shortly after RT places it on him. Patient wearing 2lpm will continue to monitor patient.

## 2015-05-25 NOTE — Progress Notes (Signed)
[facility] PROGRESS NOTE  Gavin [name] HUD:[id number] DOB: 06-22-1929 DOA: 05/22/2015 PCP: Ann Held, MD  Assessment/Plan: Principal Problem:   Acute delirium - improving off of opiods and on IV antibiotic Rocephin. - Will discontinue loratadine  Active Problems:   Obstructive sleep apnea   COPD (chronic obstructive pulmonary disease) - stable, compensated    PAF (paroxysmal atrial fibrillation) - per primary    PAD (peripheral artery disease), history of stenting to both SFAs - per primary team    HLD (hyperlipidemia) - stable on statin    CAD S/P percutaneous coronary angioplasty - continue statin and aspirin    Dementia - most likely contributing to # 1   Code Status: full Family Communication: discussed with family and patient Disposition Plan: per primary  Antibiotics:  Rocephin  HPI/Subjective: Family member reports patient condition improving.  Objective: Filed Vitals:   05/25/15 1641  BP: 113/52  Pulse: 70  Temp: 98.2 F (36.8 C)  Resp: 18    Intake/Output Summary (Last 24 hours) at 05/25/15 1709 Last data filed at 05/25/15 1300  Gross per 24 hour  Intake    360 ml  Output      0 ml  Net    360 ml   Filed Weights   05/22/15 0559 05/23/15 0322 05/24/15 0543  Weight: 83.008 kg (183 lb) 91.9 kg (202 lb 9.6 oz) 93.1 kg (205 lb 4 oz)    Exam:   General:  Pt in nad, alert and awake  Cardiovascular: s1 and s2 present, no rubs  Respiratory: no increased wob, no wheezes  Abdomen: soft, NT, ND  Musculoskeletal: equal tone   Data Reviewed: Basic Metabolic Panel:  Recent Labs Lab 05/23/15 0227 05/23/15 1453 05/24/15 0750 05/25/15 0404  NA 137 139 139 140  K 2.9* 3.2* 3.7 3.5  CL 103 104 103 99*  CO2 27 28 28  32  GLUCOSE 112* 124* 97 99  BUN 10 10 14 14   CREATININE 1.14 1.22 1.26* 1.26*  CALCIUM 7.6* 7.9* 8.5* 8.7*   Liver Function Tests: No results for input(s): AST, ALT, ALKPHOS, BILITOT, PROT, ALBUMIN in the  last 168 hours. No results for input(s): LIPASE, AMYLASE in the last 168 hours. No results for input(s): AMMONIA in the last 168 hours. CBC:  Recent Labs Lab 05/22/15 1825 05/23/15 0227 05/24/15 0750 05/25/15 0404  WBC 11.3* 10.4 12.4* 9.6  NEUTROABS 8.6*  --   --   --   HGB 11.3* 10.3* 10.5* 10.3*  HCT 34.1* 31.0* 31.5* 31.6*  MCV 96.1 96.3 96.6 96.9  PLT 440* 384 450* 465*   Cardiac Enzymes: No results for input(s): CKTOTAL, CKMB, CKMBINDEX, TROPONINI in the last 168 hours. BNP (last 3 results)  Recent Labs  11/13/14 1846  BNP 342.6*    ProBNP (last 3 results) No results for input(s): PROBNP in the last 8760 hours.  CBG:  Recent Labs Lab 05/22/15 1926  GLUCAP 108*    Recent Results (from the past 240 hour(s))  Urine culture     Status: None   Collection Time: 05/23/15  7:34 AM  Result Value Ref Range Status   Specimen Description URINE, CLEAN CATCH  Final   Special Requests NONE  Final   Culture 6,000 COLONIES/mL INSIGNIFICANT GROWTH  Final   Report Status 05/25/2015 FINAL  Final     Studies: No results found.  Scheduled Meds: . acetaminophen  1,000 mg Oral TID  . amLODipine  2.5 mg Oral Daily  . antiseptic oral rinse  7 mL Mouth Rinse q12n4p  . aspirin EC  81 mg Oral Daily  . atorvastatin  40 mg Oral QHS  . cefTRIAXone (ROCEPHIN)  IV  1 g Intravenous Q24H  . chlorhexidine  15 mL Mouth Rinse BID  . clopidogrel  75 mg Oral Q breakfast  . collagenase  1 application Topical Daily  . DULoxetine  60 mg Oral Daily  . feeding supplement (ENSURE ENLIVE)  237 mL Oral BID BM  . fluticasone  1 spray Each Nare Q2200  . furosemide  20 mg Oral Daily  . isosorbide mononitrate  30 mg Oral BID  . loratadine  10 mg Oral Daily  . metoprolol succinate  25 mg Oral Daily   And  . metoprolol succinate  50 mg Oral QHS  . pantoprazole  40 mg Oral Daily  . [START ON 05/26/2015] potassium chloride  20 mEq Oral Daily  . QUEtiapine  50 mg Oral QAC supper  . ranolazine   1,000 mg Oral BID   Continuous Infusions:    Time spent: 20 minutes    Velvet Bathe  [facility] Pager [phone]. If 7PM-7AM, please contact night-coverage at [email or web address], password [name] 05/25/2015, 5:09 PM  LOS: 1 day

## 2015-05-25 NOTE — Clinical Social Work Placement (Signed)
   CLINICAL SOCIAL WORK PLACEMENT  NOTE  Date:  05/25/2015  Patient Details  Name: BALDEMAR DADY MRN: [id number] Date of Birth: [birth year]  Clinical Social Work is seeking post-discharge placement for this patient at the [place] level of care (*CSW will initial, date and re-position this form in  chart as items are completed):  No (Mrs. Cheever provided CSW with her preferences for placement.)   Patient/family provided with [place] Work Department's list of facilities offering this level of care within the geographic area requested by the patient (or if unable, by the patient's family).  Yes   Patient/family informed of their freedom to choose among providers that offer the needed level of care, that participate in Medicare, Medicaid or managed care program needed by the patient, have an available bed and are willing to accept the patient.  No   Patient/family informed of [place]'s ownership interest in [facility] and [facility], as well as of the fact that they are under no obligation to receive care at these facilities.  PASRR submitted to EDS on 05/24/15     PASRR number received on 05/25/15     Existing PASRR number confirmed on       FL2 transmitted to all facilities in geographic area requested by pt/family on 05/25/15     FL2 transmitted to all facilities within larger geographic area on       Patient informed that his/her managed care company has contracts with or will negotiate with certain facilities, including the following:        Yes [facility])   Patient/family informed of bed offers received.  Patient chooses bed at [facility] and [place] recommends and patient chooses bed at      Patient to be transferred to [facility] on  .  Patient to be transferred to facility by       Patient family notified on   of transfer.  Name of family member notified:        PHYSICIAN        Additional Comment:    _______________________________________________ Sable Feil, LCSW 05/25/2015, 12:41 PM

## 2015-05-26 LAB — CBC
HCT: 30.9 % — ABNORMAL LOW (ref 39.0–52.0)
Hemoglobin: 10 g/dL — ABNORMAL LOW (ref 13.0–17.0)
MCH: 31.3 pg (ref 26.0–34.0)
MCHC: 32.4 g/dL (ref 30.0–36.0)
MCV: 96.9 fL (ref 78.0–100.0)
PLATELETS: 487 10*3/uL — AB (ref 150–400)
RBC: 3.19 MIL/uL — AB (ref 4.22–5.81)
RDW: 15.4 % (ref 11.5–15.5)
WBC: 10.3 10*3/uL (ref 4.0–10.5)

## 2015-05-26 LAB — BASIC METABOLIC PANEL
Anion gap: 9 (ref 5–15)
BUN: 17 mg/dL (ref 6–20)
CO2: 31 mmol/L (ref 22–32)
CREATININE: 1.38 mg/dL — AB (ref 0.61–1.24)
Calcium: 8.4 mg/dL — ABNORMAL LOW (ref 8.9–10.3)
Chloride: 99 mmol/L — ABNORMAL LOW (ref 101–111)
GFR, EST AFRICAN AMERICAN: 53 mL/min — AB (ref >60)
GFR, EST NON AFRICAN AMERICAN: 45 mL/min — AB (ref >60)
Glucose, Bld: 102 mg/dL — ABNORMAL HIGH (ref 65–99)
Potassium: 3.5 mmol/L (ref 3.5–5.1)
SODIUM: 139 mmol/L (ref 135–145)

## 2015-05-26 NOTE — Progress Notes (Signed)
Patient: Gavin [name] / Admit Date: 05/22/2015 / Date of Encounter: 05/26/2015, 6:51 AM   Subjective: Poor historian. Doesn't really participate in the interview well, confabulates, confused.   Objective: Telemetry: NSR Physical Exam: Blood pressure 131/45, pulse 76, temperature 98.1 F (36.7 C), temperature source Oral, resp. rate 18, height 5\' 8"  (1.727 m), weight 205 lb 4 oz (93.1 kg), SpO2 94 %. General: Well developed, well nourished WM in no acute distress. Head: Normocephalic, atraumatic, sclera non-icteric, no xanthomas, nares are without discharge. Neck: JVP not elevated. Lungs: Clear bilaterally to auscultation without wheezes, rales, or rhonchi. Breathing is unlabored. Heart: RRR S1 S2 without murmurs, rubs, or gallops.  Abdomen: Soft, non-tender, non-distended with normoactive bowel sounds. No rebound/guarding. Extremities: No clubbing or cyanosis. No edema. Wrapped LLE wound. Distal pulses equal bilaterally. Significant ecchymosis noted slightly distal to cath site wrapping around inner thigh and to upper outer thigh, soft; no bruising on his back. No bruit or focal hematoma. Neuro: Alert and oriented to self only. Follows commands but does not really answer questions appropriately. Repeats self.   Intake/Output Summary (Last 24 hours) at 05/26/15 0651 Last data filed at 05/25/15 1900  Gross per 24 hour  Intake    480 ml  Output      0 ml  Net    480 ml    Inpatient Medications:  . acetaminophen  1,000 mg Oral TID  . amLODipine  2.5 mg Oral Daily  . antiseptic oral rinse  7 mL Mouth Rinse q12n4p  . aspirin EC  81 mg Oral Daily  . atorvastatin  40 mg Oral QHS  . cefTRIAXone (ROCEPHIN)  IV  1 g Intravenous Q24H  . chlorhexidine  15 mL Mouth Rinse BID  . clopidogrel  75 mg Oral Q breakfast  . collagenase  1 application Topical Daily  . DULoxetine  60 mg Oral Daily  . feeding supplement (ENSURE ENLIVE)  237 mL Oral BID BM  . fluticasone  1 spray Each Nare Q2200  .  furosemide  20 mg Oral Daily  . isosorbide mononitrate  30 mg Oral BID  . metoprolol succinate  25 mg Oral Daily   And  . metoprolol succinate  50 mg Oral QHS  . pantoprazole  40 mg Oral Daily  . potassium chloride  20 mEq Oral Daily  . QUEtiapine  50 mg Oral QAC supper  . ranolazine  1,000 mg Oral BID   Infusions:    Labs:  Recent Labs  05/25/15 0404 05/26/15 0245  NA 140 139  K 3.5 3.5  CL 99* 99*  CO2 32 31  GLUCOSE 99 102*  BUN 14 17  CREATININE 1.26* 1.38*  CALCIUM 8.7* 8.4*    Recent Labs  05/25/15 0404 05/26/15 0245  WBC 9.6 10.3  HGB 10.3* 10.0*  HCT 31.6* 30.9*  MCV 96.9 96.9  PLT 465* 487*   Radiology/Studies:  No results found.   Assessment and Plan  89M with history of CAD (s/ multiple PCIs, including most recently NSTEMI s/p PCI/DES of the RCA 10/2014), PAD, HTN, HLD, PAF (not on anticoag 2/2 multiple falls), OSA, mild dementia, ICM EF 45-50% in 10/2014 who presented to [facility] 05/22/2015 for planned PV angio.  1. PAD (peripheral artery disease), very remote history of stenting to both SFAs - Angiography showed patent right SFA; crossing of chronic total occlusion with a Viance catheter, PTCA with a chocolate balloon, s/p stenting of distal left SFA and P1 segment of popliteal with a nitinol  self-expanding stent - Continue ASA 81mg , Plavix, statin  2.LLE wound - Seen by [facility] nurse. Recommended enzymatic debridement ointment now that patient has revascularization. Cover with moist gauze, change daily -> has HHRN at home for wound care - finished course of dicloxacillion, d/c'd by IM - f/u PCP for this  3. Hypokalemia - now on daily repletion  4. CAD - no angina, continue home regimen  5. Acute on chronic appearing anemia, suspect due to post-cath ecchymosis - this will likely remain for 1-2 weeks before resorbing - Hgb remains in 10 range - can f/u CBC as outpatient  6. HTN, controlled  7. Nitrite positive UA - on Rocephin per IM, appreciate  input about transition to outpatient rx  8. Delirium on baseline dementia likely due to hospitalization/UTI - appreciate IM assistance  9. Probable CKD stage III - outpatient Cr 1.39 - varying from 1.14-1.38 this admission - can f/u as outpatient  Hopefully to SNF once bed available.  Signed, Melina Copa PA-C Pager: [phone]   Patient seen and examined. Agree with assessment and plan. No chest pain. Confused. I/O +1520 since admission. Cr 1.38 today.   Troy Sine, MD, [facility] 05/26/2015 10:59 AM

## 2015-05-26 NOTE — Progress Notes (Signed)
Utilization review complete. Jannat Rosemeyer RN CCM Case Mgmt phone [phone] 

## 2015-05-26 NOTE — Progress Notes (Signed)
Physical Therapy Treatment Patient Details Name: Gavin [name] MRN: [id number] DOB: Oct 14, 1928 Today's Date: 05/26/2015    History of Present Illness 59M with history of CAD (s/ multiple PCIs, including most recently NSTEMI s/p PCI/DES of the RCA 10/2014), PAD, HTN, HLD, PAF (not on anticoag 2/2 multiple falls), OSA, mild dementia, ICM EF 45-50% in 10/2014 who presented to [facility] 05/22/2015 for planned PV angio. - Angiography showed patent right SFA; crossing of chronic total occlusion with a Viance catheter, PTCA with a chocolate balloon, s/p stenting of distal left SFA and P1 segment of popliteal with a nitinol self-expanding stent    PT Comments    Patient making improvements with mobility and gait.  Continues to have confusion.  Agree with need for SNF.  Follow Up Recommendations  SNF     Equipment Recommendations  3in1 (PT)    Recommendations for Other Services       Precautions / Restrictions Precautions Precautions: Fall Restrictions Weight Bearing Restrictions: No    Mobility  Bed Mobility               General bed mobility comments: Patient in chair as PT entered room.  Transfers Overall transfer level: Needs assistance Equipment used: Rolling walker (2 wheeled) Transfers: Sit to/from Stand Sit to Stand: Min assist;+2 physical assistance         General transfer comment: Patient required redirection and step-by-step instructions to move to standing.  Distracted internally.  Min assist to rise to standing and for balance.  Ambulation/Gait Ambulation/Gait assistance: Min assist;+2 physical assistance Ambulation Distance (Feet): 52 Feet Assistive device: Rolling walker (2 wheeled) Gait Pattern/deviations: Step-through pattern;Decreased stride length;Shuffle;Drifts right/left;Trunk flexed Gait velocity: decreased Gait velocity interpretation: Below normal speed for age/gender General Gait Details: Repeated verbal and tactile cues to use RW safely.  Patient  walking against Rt wall, running into obstacles in hallway.     Stairs            Wheelchair Mobility    Modified Rankin (Stroke Patients Only)       Balance Overall balance assessment: Needs assistance         Standing balance support: Bilateral upper extremity supported Standing balance-Leahy Scale: Poor Standing balance comment: More upright posture today, with improved balance.  No posterior lean today.                    Cognition Arousal/Alertness: Awake/alert Behavior During Therapy: Flat affect Overall Cognitive Status: Impaired/Different from baseline Area of Impairment: Orientation;Attention;Memory;Following commands;Safety/judgement;Awareness;Problem solving Orientation Level: Disoriented to;Place;Time;Situation Current Attention Level: Focused Memory: Decreased short-term memory Following Commands: Follows one step commands with increased time Safety/Judgement: Decreased awareness of safety;Decreased awareness of deficits   Problem Solving: Slow processing;Difficulty sequencing;Requires verbal cues;Requires tactile cues General Comments: Patient states he is in "some community building".  Focused on LLE pain.  Conversation difficult to understand.    Exercises      General Comments General comments (skin integrity, edema, etc.): Wound bandaged on LLE.      Pertinent Vitals/Pain Pain Assessment: Faces Faces Pain Scale: Hurts a little bit Pain Location: LLE Pain Intervention(s): Monitored during session;Repositioned    Home Living                      Prior Function            PT Goals (current goals can now be found in the care plan section) Progress towards PT goals: Progressing toward goals  Frequency  Min 3X/week    PT Plan Current plan remains appropriate    Co-evaluation             End of Session Equipment Utilized During Treatment: Gait belt Activity Tolerance: Patient limited by fatigue Patient left:  in chair;with call bell/phone within reach;with chair alarm set     Time: 5183-3582 PT Time Calculation (min) (ACUTE ONLY): 15 min  Charges:  $Gait Training: 8-22 mins                    G Codes:      Despina Pole 13-Jun-2015, 3:56 PM Carita Pian. Sanjuana Kava, [place] Pager [phone]

## 2015-05-26 NOTE — Progress Notes (Signed)
[facility] PROGRESS NOTE  Gavin [name] VXB:[id number] DOB: 1929/05/11 DOA: 05/22/2015 PCP: Ann Held, MD  Assessment/Plan: Principal Problem:   Acute delirium - improving off of opiods and on IV antibiotic Rocephin. - Discontinued loratadine - has daily improvement in mentation but still not at baseline.  Active Problems:   Obstructive sleep apnea   COPD (chronic obstructive pulmonary disease) - stable, compensated    PAF (paroxysmal atrial fibrillation) - per primary    PAD (peripheral artery disease), history of stenting to both SFAs - per primary team    HLD (hyperlipidemia) - stable on statin    CAD S/P percutaneous coronary angioplasty - continue statin and aspirin    Dementia - most likely contributing to # 1   Code Status: full Family Communication: discussed with family and patient Disposition Plan: per primary  Antibiotics:  Rocephin  HPI/Subjective: Family member reports patient condition improving.  Objective: Filed Vitals:   05/26/15 1217  BP: 116/46  Pulse: 74  Temp: 98.1 F (36.7 C)  Resp: 18    Intake/Output Summary (Last 24 hours) at 05/26/15 1413 Last data filed at 05/26/15 0914  Gross per 24 hour  Intake    540 ml  Output      0 ml  Net    540 ml   Filed Weights   05/22/15 0559 05/23/15 0322 05/24/15 0543  Weight: 83.008 kg (183 lb) 91.9 kg (202 lb 9.6 oz) 93.1 kg (205 lb 4 oz)    Exam:   General:  Pt in nad, alert and awake  Cardiovascular: s1 and s2 present, no rubs  Respiratory: no increased wob, no wheezes  Abdomen: soft, NT, ND  Musculoskeletal: equal tone   Data Reviewed: Basic Metabolic Panel:  Recent Labs Lab 05/23/15 0227 05/23/15 1453 05/24/15 0750 05/25/15 0404 05/26/15 0245  NA 137 139 139 140 139  K 2.9* 3.2* 3.7 3.5 3.5  CL 103 104 103 99* 99*  CO2 27 28 28  32 31  GLUCOSE 112* 124* 97 99 102*  BUN 10 10 14 14 17   CREATININE 1.14 1.22 1.26* 1.26* 1.38*  CALCIUM 7.6* 7.9* 8.5* 8.7*  8.4*   Liver Function Tests: No results for input(s): AST, ALT, ALKPHOS, BILITOT, PROT, ALBUMIN in the last 168 hours. No results for input(s): LIPASE, AMYLASE in the last 168 hours. No results for input(s): AMMONIA in the last 168 hours. CBC:  Recent Labs Lab 05/22/15 1825 05/23/15 0227 05/24/15 0750 05/25/15 0404 05/26/15 0245  WBC 11.3* 10.4 12.4* 9.6 10.3  NEUTROABS 8.6*  --   --   --   --   HGB 11.3* 10.3* 10.5* 10.3* 10.0*  HCT 34.1* 31.0* 31.5* 31.6* 30.9*  MCV 96.1 96.3 96.6 96.9 96.9  PLT 440* 384 450* 465* 487*   Cardiac Enzymes: No results for input(s): CKTOTAL, CKMB, CKMBINDEX, TROPONINI in the last 168 hours. BNP (last 3 results)  Recent Labs  11/13/14 1846  BNP 342.6*    ProBNP (last 3 results) No results for input(s): PROBNP in the last 8760 hours.  CBG:  Recent Labs Lab 05/22/15 1926  GLUCAP 108*    Recent Results (from the past 240 hour(s))  Urine culture     Status: None   Collection Time: 05/23/15  7:34 AM  Result Value Ref Range Status   Specimen Description URINE, CLEAN CATCH  Final   Special Requests NONE  Final   Culture 6,000 COLONIES/mL INSIGNIFICANT GROWTH  Final   Report Status 05/25/2015 FINAL  Final  Studies: No results found.  Scheduled Meds: . acetaminophen  1,000 mg Oral TID  . amLODipine  2.5 mg Oral Daily  . antiseptic oral rinse  7 mL Mouth Rinse q12n4p  . aspirin EC  81 mg Oral Daily  . atorvastatin  40 mg Oral QHS  . cefTRIAXone (ROCEPHIN)  IV  1 g Intravenous Q24H  . chlorhexidine  15 mL Mouth Rinse BID  . clopidogrel  75 mg Oral Q breakfast  . collagenase  1 application Topical Daily  . DULoxetine  60 mg Oral Daily  . feeding supplement (ENSURE ENLIVE)  237 mL Oral BID BM  . fluticasone  1 spray Each Nare Q2200  . furosemide  20 mg Oral Daily  . isosorbide mononitrate  30 mg Oral BID  . metoprolol succinate  25 mg Oral Daily   And  . metoprolol succinate  50 mg Oral QHS  . pantoprazole  40 mg Oral  Daily  . potassium chloride  20 mEq Oral Daily  . QUEtiapine  50 mg Oral QAC supper  . ranolazine  1,000 mg Oral BID   Continuous Infusions:    Time spent: 20 minutes    Velvet Bathe  [facility] Pager [phone]. If 7PM-7AM, please contact night-coverage at [email or web address], password [facility] 05/26/2015, 2:13 PM  LOS: 2 days

## 2015-05-26 NOTE — Progress Notes (Signed)
SNF not able to take patient until tomorrow. Will continue to observe and continue to treat UTI. F/u Cr and CBC in AM. Melina Copa PA-C

## 2015-05-27 DIAGNOSIS — E785 Hyperlipidemia, unspecified: Secondary | ICD-10-CM | POA: Diagnosis not present

## 2015-05-27 DIAGNOSIS — J441 Chronic obstructive pulmonary disease with (acute) exacerbation: Secondary | ICD-10-CM | POA: Diagnosis not present

## 2015-05-27 DIAGNOSIS — I480 Paroxysmal atrial fibrillation: Secondary | ICD-10-CM | POA: Diagnosis not present

## 2015-05-27 DIAGNOSIS — N19 Unspecified kidney failure: Secondary | ICD-10-CM | POA: Diagnosis not present

## 2015-05-27 DIAGNOSIS — M549 Dorsalgia, unspecified: Secondary | ICD-10-CM | POA: Diagnosis not present

## 2015-05-27 DIAGNOSIS — K210 Gastro-esophageal reflux disease with esophagitis: Secondary | ICD-10-CM | POA: Diagnosis not present

## 2015-05-27 DIAGNOSIS — I509 Heart failure, unspecified: Secondary | ICD-10-CM | POA: Diagnosis not present

## 2015-05-27 DIAGNOSIS — L97229 Non-pressure chronic ulcer of left calf with unspecified severity: Secondary | ICD-10-CM | POA: Diagnosis not present

## 2015-05-27 DIAGNOSIS — D649 Anemia, unspecified: Secondary | ICD-10-CM | POA: Diagnosis not present

## 2015-05-27 DIAGNOSIS — A419 Sepsis, unspecified organism: Secondary | ICD-10-CM | POA: Diagnosis not present

## 2015-05-27 DIAGNOSIS — I779 Disorder of arteries and arterioles, unspecified: Secondary | ICD-10-CM | POA: Diagnosis not present

## 2015-05-27 DIAGNOSIS — E0590 Thyrotoxicosis, unspecified without thyrotoxic crisis or storm: Secondary | ICD-10-CM | POA: Diagnosis not present

## 2015-05-27 DIAGNOSIS — I771 Stricture of artery: Secondary | ICD-10-CM | POA: Diagnosis not present

## 2015-05-27 DIAGNOSIS — Z9181 History of falling: Secondary | ICD-10-CM | POA: Diagnosis not present

## 2015-05-27 DIAGNOSIS — R1310 Dysphagia, unspecified: Secondary | ICD-10-CM | POA: Diagnosis not present

## 2015-05-27 DIAGNOSIS — I70202 Unspecified atherosclerosis of native arteries of extremities, left leg: Secondary | ICD-10-CM | POA: Diagnosis not present

## 2015-05-27 DIAGNOSIS — I2510 Atherosclerotic heart disease of native coronary artery without angina pectoris: Secondary | ICD-10-CM | POA: Diagnosis not present

## 2015-05-27 DIAGNOSIS — F0390 Unspecified dementia without behavioral disturbance: Secondary | ICD-10-CM | POA: Diagnosis not present

## 2015-05-27 DIAGNOSIS — M6281 Muscle weakness (generalized): Secondary | ICD-10-CM | POA: Diagnosis not present

## 2015-05-27 DIAGNOSIS — F329 Major depressive disorder, single episode, unspecified: Secondary | ICD-10-CM | POA: Diagnosis not present

## 2015-05-27 DIAGNOSIS — F17200 Nicotine dependence, unspecified, uncomplicated: Secondary | ICD-10-CM | POA: Diagnosis not present

## 2015-05-27 DIAGNOSIS — Z9861 Coronary angioplasty status: Secondary | ICD-10-CM | POA: Diagnosis not present

## 2015-05-27 DIAGNOSIS — N390 Urinary tract infection, site not specified: Secondary | ICD-10-CM | POA: Diagnosis not present

## 2015-05-27 DIAGNOSIS — R1311 Dysphagia, oral phase: Secondary | ICD-10-CM | POA: Diagnosis not present

## 2015-05-27 DIAGNOSIS — R488 Other symbolic dysfunctions: Secondary | ICD-10-CM | POA: Diagnosis not present

## 2015-05-27 DIAGNOSIS — M1990 Unspecified osteoarthritis, unspecified site: Secondary | ICD-10-CM | POA: Diagnosis not present

## 2015-05-27 DIAGNOSIS — G4737 Central sleep apnea in conditions classified elsewhere: Secondary | ICD-10-CM | POA: Diagnosis not present

## 2015-05-27 DIAGNOSIS — I998 Other disorder of circulatory system: Secondary | ICD-10-CM | POA: Diagnosis not present

## 2015-05-27 DIAGNOSIS — Z452 Encounter for adjustment and management of vascular access device: Secondary | ICD-10-CM | POA: Diagnosis not present

## 2015-05-27 DIAGNOSIS — F0151 Vascular dementia with behavioral disturbance: Secondary | ICD-10-CM | POA: Diagnosis not present

## 2015-05-27 DIAGNOSIS — R918 Other nonspecific abnormal finding of lung field: Secondary | ICD-10-CM | POA: Diagnosis not present

## 2015-05-27 DIAGNOSIS — F29 Unspecified psychosis not due to a substance or known physiological condition: Secondary | ICD-10-CM | POA: Diagnosis not present

## 2015-05-27 DIAGNOSIS — I10 Essential (primary) hypertension: Secondary | ICD-10-CM | POA: Diagnosis not present

## 2015-05-27 DIAGNOSIS — R262 Difficulty in walking, not elsewhere classified: Secondary | ICD-10-CM | POA: Diagnosis not present

## 2015-05-27 DIAGNOSIS — I739 Peripheral vascular disease, unspecified: Secondary | ICD-10-CM | POA: Diagnosis not present

## 2015-05-27 DIAGNOSIS — L98499 Non-pressure chronic ulcer of skin of other sites with unspecified severity: Secondary | ICD-10-CM | POA: Diagnosis not present

## 2015-05-27 DIAGNOSIS — A4852 Wound botulism: Secondary | ICD-10-CM | POA: Diagnosis not present

## 2015-05-27 LAB — BASIC METABOLIC PANEL
ANION GAP: 9 (ref 5–15)
BUN: 21 mg/dL — AB (ref 6–20)
CO2: 30 mmol/L (ref 22–32)
Calcium: 8.6 mg/dL — ABNORMAL LOW (ref 8.9–10.3)
Chloride: 101 mmol/L (ref 101–111)
Creatinine, Ser: 1.29 mg/dL — ABNORMAL HIGH (ref 0.61–1.24)
GFR, EST AFRICAN AMERICAN: 57 mL/min — AB (ref >60)
GFR, EST NON AFRICAN AMERICAN: 49 mL/min — AB (ref >60)
Glucose, Bld: 101 mg/dL — ABNORMAL HIGH (ref 65–99)
POTASSIUM: 3.8 mmol/L (ref 3.5–5.1)
SODIUM: 140 mmol/L (ref 135–145)

## 2015-05-27 LAB — CBC
HEMATOCRIT: 31 % — AB (ref 39.0–52.0)
Hemoglobin: 10.2 g/dL — ABNORMAL LOW (ref 13.0–17.0)
MCH: 32.4 pg (ref 26.0–34.0)
MCHC: 32.9 g/dL (ref 30.0–36.0)
MCV: 98.4 fL (ref 78.0–100.0)
PLATELETS: 480 10*3/uL — AB (ref 150–400)
RBC: 3.15 MIL/uL — AB (ref 4.22–5.81)
RDW: 15.7 % — AB (ref 11.5–15.5)
WBC: 11.7 10*3/uL — AB (ref 4.0–10.5)

## 2015-05-27 MED ORDER — POTASSIUM CHLORIDE CRYS ER 20 MEQ PO TBCR: 20.0000 meq | EXTENDED_RELEASE_TABLET | Freq: Every day | ORAL | Status: DC

## 2015-05-27 MED ORDER — FUROSEMIDE 40 MG PO TABS: 20.0000 mg | ORAL_TABLET | Freq: Every day | ORAL | Status: DC

## 2015-05-27 MED ORDER — ENSURE ENLIVE PO LIQD: 237.0000 mL | Freq: Two times a day (BID) | ORAL | Status: DC

## 2015-05-27 NOTE — Discharge Summary (Signed)
Patient ID: Gavin [name],  MRN: [id number], DOB/AGE: June 01, 1929 81 y.o.  Admit date: 05/22/2015 Discharge date: 05/27/2015  Primary Care Provider: Ann Held, MD Primary Cardiologist: Dr [name]  Discharge Diagnoses Principal Problem:   Critical lower limb ischemia Active Problems:   PAD, history of stenting to both SFAs with Lt SFA PTA 05/22/15   PAF- CHADs VASc = 4. Not on anticoagulation secondary to hx of falls   CAD S/P multiple PCI's, last was RCA DES Feb 2016   Dementia   Acute delirium   Obstructive sleep apnea   COPD (chronic obstructive pulmonary disease)   HLD (hyperlipidemia)   Essential hypertension   Abnormal urinalysis-culture negative    Procedures: PV angiogram and Lt SFA PTA 05/22/15   Hospital Course:  81 y/o male followed by Dr [name] with a history of CAD, s/p multiple PCI's in the past. Last cath was Feb 2016 and he had a PCI with DES to his RCA then. The LAD and CFX were patent then. His EF was 45-50%. He has a history of PAF- not on Coumadin secondary to a history of falls.              He saw Dr [name] in the office 05/17/15. He  Had developed a nonhealing wound on the medial aspect of his left calf. The patient has had previous bilateral SFA stenting by Dr [name]. Dopplers revealed decrease in his ABI from 0.86 on the left with a patent SFA, and non-measurable ABI on Lt because of pain, but it suggested an occluded left SFA. Dr [name] felt he had critical limb ischemia and arranged for him to undergo angiography. This was done 05/22/15 and revealed a CTO of the Lt SFA and P1 segment of the popliteal artery with one vessel runoff. The Rt SFA had a patent, previously placed, stent. He underwent successful PTA and restenting of a CTO of the Lt SFA in the setting of critical limb ischemia. He tolerated this well. Post intervention we arranged for a wound care consult. The plan was for discharge within 24-48 hrs but the pt developed confusion. Discussion with the pt's  wife revealed he has some degree of baseline confusion at home but he was worse during this admission. There was concern he had a UTI and this was contributing and he was placed on ABs till his urine culture came back. Opiates were discontinued. Internal Medicine saw the pt in consult and made recommendations regarding the pt's medications. It became clear the pt would not be able to go home and SNF was recommended. The pt will f/u with Dr [name] in a few weeks. Will review the need for f/u dopplers with Dr [name] then.    Discharge Vitals:  Blood pressure 173/70, pulse 70, temperature 97.4 F (36.3 C), temperature source Oral, resp. rate 18, height 5\' 8"  (1.727 m), weight 202 lb 9.6 oz (91.9 kg), SpO2 95 %.    Labs: Results for orders placed or performed during the hospital encounter of 05/22/15 (from the past 24 hour(s))  Basic metabolic panel     Status: Abnormal   Collection Time: 05/27/15  2:30 AM  Result Value Ref Range   Sodium 140 135 - 145 mmol/L   Potassium 3.8 3.5 - 5.1 mmol/L   Chloride 101 101 - 111 mmol/L   CO2 30 22 - 32 mmol/L   Glucose, Bld 101 (H) 65 - 99 mg/dL   BUN 21 (H) 6 - 20 mg/dL  Creatinine, Ser 1.29 (H) 0.61 - 1.24 mg/dL   Calcium 8.6 (L) 8.9 - 10.3 mg/dL   GFR calc non Af Amer 49 (L) >60 mL/min   GFR calc Af Amer 57 (L) >60 mL/min   Anion gap 9 5 - 15  CBC     Status: Abnormal   Collection Time: 05/27/15  2:30 AM  Result Value Ref Range   WBC 11.7 (H) 4.0 - 10.5 K/uL   RBC 3.15 (L) 4.22 - 5.81 MIL/uL   Hemoglobin 10.2 (L) 13.0 - 17.0 g/dL   HCT 31.0 (L) 39.0 - 52.0 %   MCV 98.4 78.0 - 100.0 fL   MCH 32.4 26.0 - 34.0 pg   MCHC 32.9 30.0 - 36.0 g/dL   RDW 15.7 (H) 11.5 - 15.5 %   Platelets 480 (H) 150 - 400 K/uL    Disposition:      Follow-up Information    Follow up with Quay Burow, MD.   Specialties:  Cardiology, Radiology   Why:  office will contact you   Contact information:   [address] [place]  [address] [phone]       Discharge Medications:    Medication List    STOP taking these medications        dicloxacillin 250 MG capsule  Commonly known as:  DYNAPEN     lidocaine 5 % ointment  Commonly known as:  XYLOCAINE     mirtazapine 15 MG tablet  Commonly known as:  REMERON     multivitamin tablet     NAMZARIC 28-10 MG Cp24  Generic drug:  Memantine HCl-Donepezil HCl     NASONEX 50 MCG/ACT nasal spray  Generic drug:  mometasone     oxyCODONE-acetaminophen 5-325 MG per tablet  Commonly known as:  PERCOCET/ROXICET     PRESERVISION AREDS PO      TAKE these medications        acetaminophen 500 MG tablet  Commonly known as:  TYLENOL  Take 1,000 mg by mouth every 6 (six) hours as needed for moderate pain.     ALAWAY OP  Place 1 drop into both eyes 2 (two) times daily as needed (dry eyes).     albuterol 108 (90 BASE) MCG/ACT inhaler  Commonly known as:  PROVENTIL HFA;VENTOLIN HFA  Inhale into the lungs every 6 (six) hours as needed for wheezing or shortness of breath. ProAir     amLODipine 2.5 MG tablet  Commonly known as:  NORVASC  Take 1 tablet (2.5 mg total) by mouth daily.     aspirin EC 81 MG tablet  Take 81 mg by mouth at bedtime.     atorvastatin 40 MG tablet  Commonly known as:  LIPITOR  Take 1 tablet (40 mg total) by mouth at bedtime.     BIOFREEZE EX  Apply 1 application topically daily as needed (pain).     clopidogrel 75 MG tablet  Commonly known as:  PLAVIX  Take 1 tablet (75 mg total) by mouth daily.     DULoxetine 60 MG capsule  Commonly known as:  CYMBALTA  Take 60 mg by mouth daily.     feeding supplement (ENSURE ENLIVE) Liqd  Take 237 mLs by mouth 2 (two) times daily between meals.     fexofenadine 180 MG tablet  Commonly known as:  ALLEGRA  Take 180 mg by mouth daily.     furosemide 40 MG tablet  Commonly known as:  LASIX  Take 0.5 tablets (  20 mg total) by mouth daily.     isosorbide mononitrate 30 MG 24 hr tablet   Commonly known as:  IMDUR  Take 30 mg by mouth 2 (two) times daily.     metolazone 2.5 MG tablet  Commonly known as:  ZAROXOLYN  Take 2.5 mg by mouth every other day as needed (for fluid). Every other day     metoprolol succinate 50 MG 24 hr tablet  Commonly known as:  TOPROL-XL  Take 25-50 mg by mouth daily. Take 25 mg in the morning and 50 mg in the evening.     multivitamin with minerals Tabs tablet  Take 1 tablet by mouth daily.     nitroGLYCERIN 0.4 MG SL tablet  Commonly known as:  NITROSTAT  Place 0.4 mg under the tongue every 5 (five) minutes as needed for chest pain.     pantoprazole 40 MG tablet  Commonly known as:  PROTONIX  take 1 tablet by mouth once daily     potassium chloride SA 20 MEQ tablet  Commonly known as:  K-DUR,KLOR-CON  Take 1 tablet (20 mEq total) by mouth daily.     QUEtiapine 50 MG tablet  Commonly known as:  SEROQUEL  Take 50 mg by mouth daily before supper.     RANEXA 1000 MG SR tablet  Generic drug:  ranolazine  Take 1,000 mg by mouth 2 (two) times daily.     SANTYL ointment  Generic drug:  collagenase  Apply 1 application topically daily.         Duration of Discharge Encounter: Greater than 30 minutes including physician time.  Angelena Form PA-C 05/27/2015 8:20 AM

## 2015-05-27 NOTE — Progress Notes (Signed)
Patient refused CPAP for tonight. RT will continue to monitor as needed.  

## 2015-05-27 NOTE — Clinical Social Work Placement (Signed)
   CLINICAL SOCIAL WORK PLACEMENT  NOTE  Date:  05/27/2015  Patient Details  Name: Gavin [name] MRN: [id number] Date of Birth: [birth year]  Clinical Social Work is seeking post-discharge placement for this patient at the [place] level of care (*CSW will initial, date and re-position this form in  chart as items are completed):  No (Mrs. Baade provided CSW with her preferences for placement.)   Patient/family provided with [place] Work Department's list of facilities offering this level of care within the geographic area requested by the patient (or if unable, by the patient's family).  Yes   Patient/family informed of their freedom to choose among providers that offer the needed level of care, that participate in Medicare, Medicaid or managed care program needed by the patient, have an available bed and are willing to accept the patient.  No   Patient/family informed of [facility]'s ownership interest in [facility] and [facility], as well as of the fact that they are under no obligation to receive care at these facilities.  PASRR submitted to EDS on 05/24/15     PASRR number received on 05/25/15     Existing PASRR number confirmed on       FL2 transmitted to all facilities in geographic area requested by pt/family on 05/25/15     FL2 transmitted to all facilities within larger geographic area on       Patient informed that his/her managed care company has contracts with or will negotiate with certain facilities, including the following:        Yes [facility])   Patient/family informed of bed offers received.  Patient chooses bed at [facility] and [facility] recommends and patient chooses bed at      Patient to be transferred to [facility] on 05/27/15.  Patient to be transferred to facility by [facility]     Patient family notified on 05/27/15 of transfer.  Name of family  member notified:   Reshaun Briseno- Wife     PHYSICIAN Please sign FL2, Please prepare priority discharge summary, including medications, Please prepare prescriptions     Additional Comment:  Ok per MD for d/c today to SNF.  Wife in room and is agreeable to transport. DC arrangements completed with the SNF. Nursing to call report. Packet prepared and EMS contacted.  No further CSW needs identified. CSW signing off.  Lorie Phenix. Murrell Redden [phone]      _______________________________________________ Williemae Area, LCSW 05/27/2015, 10:56 AM

## 2015-05-27 NOTE — Progress Notes (Signed)
Patient: Gavin [name] / Admit Date: 05/22/2015 / Date of Encounter: 05/27/2015, 7:39 AM   Subjective: Poor historian. Doesn't really participate in the interview well,  confused but pleasant aware of his cardiologist and making jokes    Objective: Telemetry: NSR Physical Exam: Blood pressure 122/46, pulse 66, temperature 97.5 F (36.4 C), temperature source Axillary, resp. rate 18, height 5\' 8"  (1.727 m), weight 202 lb 9.6 oz (91.9 kg), SpO2 98 %. General: Well developed, well nourished WM in no acute distress. Odor  Head: Normocephalic, atraumatic, sclera non-icteric, no xanthomas, nares are without discharge. Neck: JVP not elevated. Lungs: Clear bilaterally to auscultation without wheezes, rales, or rhonchi. Breathing is unlabored. Heart: RRR S1 S2 without murmurs, rubs, or gallops.  Abdomen: Soft, non-tender, non-distended with normoactive bowel sounds. No rebound/guarding. Extremities: No clubbing or cyanosis. No edema. Wrapped LLE wound. Distal pulses equal bilaterally. Significant ecchymosis noted slightly distal to cath site wrapping around inner thigh and to upper outer thigh, soft; no bruising on his back. No bruit or focal hematoma. Neuro: Alert and oriented to self only. Follows commands but does not really answer questions appropriately. Repeats self.   Intake/Output Summary (Last 24 hours) at 05/27/15 0739 Last data filed at 05/26/15 2047  Gross per 24 hour  Intake    640 ml  Output      0 ml  Net    640 ml    Inpatient Medications:  . acetaminophen  1,000 mg Oral TID  . amLODipine  2.5 mg Oral Daily  . antiseptic oral rinse  7 mL Mouth Rinse q12n4p  . aspirin EC  81 mg Oral Daily  . atorvastatin  40 mg Oral QHS  . cefTRIAXone (ROCEPHIN)  IV  1 g Intravenous Q24H  . chlorhexidine  15 mL Mouth Rinse BID  . clopidogrel  75 mg Oral Q breakfast  . collagenase  1 application Topical Daily  . DULoxetine  60 mg Oral Daily  . feeding supplement (ENSURE ENLIVE)  237 mL  Oral BID BM  . fluticasone  1 spray Each Nare Q2200  . furosemide  20 mg Oral Daily  . isosorbide mononitrate  30 mg Oral BID  . metoprolol succinate  25 mg Oral Daily   And  . metoprolol succinate  50 mg Oral QHS  . pantoprazole  40 mg Oral Daily  . potassium chloride  20 mEq Oral Daily  . QUEtiapine  50 mg Oral QAC supper  . ranolazine  1,000 mg Oral BID   Infusions:    Labs:  Recent Labs  05/26/15 0245 05/27/15 0230  NA 139 140  K 3.5 3.8  CL 99* 101  CO2 31 30  GLUCOSE 102* 101*  BUN 17 21*  CREATININE 1.38* 1.29*  CALCIUM 8.4* 8.6*    Recent Labs  05/26/15 0245 05/27/15 0230  WBC 10.3 11.7*  HGB 10.0* 10.2*  HCT 30.9* 31.0*  MCV 96.9 98.4  PLT 487* 480*   Radiology/Studies:  No results found.   Assessment and Plan  69M with history of CAD (s/ multiple PCIs, including most recently NSTEMI s/p PCI/DES of the RCA 10/2014), PAD, HTN, HLD, PAF (not on anticoag 2/2 multiple falls), OSA, mild dementia, ICM EF 45-50% in 10/2014 who was [facility] to [facility] 05/22/2015 for planned PV angio.  1. PAD (peripheral artery disease), very remote history of stenting to both SFAs - Angiography showed patent right SFA; crossing of chronic total occlusion with a Viance catheter, PTCA with a chocolate balloon, s/p  stenting of distal left SFA and P1 segment of popliteal with a nitinol self-expanding stent - Continue ASA 81mg , Plavix, statin  2.LLE wound - Seen by [facility] nurse. Recommended enzymatic debridement ointment now that patient has revascularization. Cover with moist gauze, change daily -> has HHRN at home for wound care - finished course of dicloxacillion, d/c'd by IM - f/u PCP for this  3. Hypokalemia - now on daily repletion  4. CAD - no angina, continue home regimen  5. Acute on chronic appearing anemia, suspect due to post-cath ecchymosis - this will likely remain for 1-2 weeks before resorbing - Hgb remains in 10 range - can f/u CBC as outpatient  6. HTN,  controlled  7. Nitrite positive UA     -urine culture showed insignificant growth- will stop ABs  8. Delirium on baseline dementia likely due to hospitalization/UTI - appreciate IM assistance  9. Probable CKD stage III - outpatient Cr 1.39 - varying from 1.14-1.38 this admission - can f/u as outpatient  Plan: Labs stable. SNF placement today.   [name] PA-C 05/27/2015 7:43 AM  As above  Seen and evaluated Plan outlined

## 2015-05-27 NOTE — Progress Notes (Signed)
Patient transferred to [facility] via ambulance. Noted FL2 on counter after patient left. Notified [facility] phone # [phone] and then faxed FL2 to her at fax [phone] as she instructed.

## 2015-06-02 DIAGNOSIS — I739 Peripheral vascular disease, unspecified: Secondary | ICD-10-CM | POA: Diagnosis not present

## 2015-06-02 DIAGNOSIS — F0390 Unspecified dementia without behavioral disturbance: Secondary | ICD-10-CM | POA: Diagnosis not present

## 2015-06-02 DIAGNOSIS — I2510 Atherosclerotic heart disease of native coronary artery without angina pectoris: Secondary | ICD-10-CM | POA: Diagnosis not present

## 2015-06-02 DIAGNOSIS — D649 Anemia, unspecified: Secondary | ICD-10-CM | POA: Diagnosis not present

## 2015-06-08 ENCOUNTER — Other Ambulatory Visit: Payer: Self-pay | Admitting: *Deleted

## 2015-06-08 DIAGNOSIS — I739 Peripheral vascular disease, unspecified: Secondary | ICD-10-CM

## 2015-06-08 DIAGNOSIS — I10 Essential (primary) hypertension: Secondary | ICD-10-CM

## 2015-06-08 NOTE — Patient Outreach (Signed)
[facility]) Care Management  06/08/2015  MALEKI HIPPE 1929/02/06 [id number]   Request from Joylene Draft, RN to assign SW, assigned [employer], [place].  Thanks, Ronnell Freshwater. [place], [place] Assistant Phone: [phone] Fax: [phone]

## 2015-06-08 NOTE — Patient Outreach (Signed)
[facility]) Care Management  06/08/2015  JARRICK FJELD 04-11-1929 [id number]   Outgoing call to Mrs. Fedora, to follow up regarding  Mr.Michaeljames Schmader that was discharged to [facility], skilled nursing facility in [place], from [facility] on 9/3. According to Mrs.Reim patient is expected to be in facility for 2 more weeks depending on progress. I have placed a call to discharge planner at facility awaiting return call.  Plan: Will place [facility] social worker consult to follow patient at facility for discharge planning needs.  Joylene Draft, RN, [facility] Care Management [phone]

## 2015-06-13 ENCOUNTER — Other Ambulatory Visit: Payer: Medicare Other | Admitting: *Deleted

## 2015-06-13 ENCOUNTER — Ambulatory Visit (INDEPENDENT_AMBULATORY_CARE_PROVIDER_SITE_OTHER): Payer: Medicare Other | Admitting: Cardiology

## 2015-06-13 ENCOUNTER — Encounter: Payer: Self-pay | Admitting: Cardiology

## 2015-06-13 VITALS — BP 132/50 | Ht 67.0 in | Wt 192.1 lb

## 2015-06-13 DIAGNOSIS — I480 Paroxysmal atrial fibrillation: Secondary | ICD-10-CM | POA: Diagnosis not present

## 2015-06-13 DIAGNOSIS — N2889 Other specified disorders of kidney and ureter: Secondary | ICD-10-CM

## 2015-06-13 DIAGNOSIS — I739 Peripheral vascular disease, unspecified: Secondary | ICD-10-CM

## 2015-06-13 DIAGNOSIS — I2510 Atherosclerotic heart disease of native coronary artery without angina pectoris: Secondary | ICD-10-CM | POA: Diagnosis not present

## 2015-06-13 DIAGNOSIS — Z9861 Coronary angioplasty status: Secondary | ICD-10-CM | POA: Diagnosis not present

## 2015-06-13 DIAGNOSIS — N189 Chronic kidney disease, unspecified: Secondary | ICD-10-CM

## 2015-06-13 DIAGNOSIS — I10 Essential (primary) hypertension: Secondary | ICD-10-CM | POA: Diagnosis not present

## 2015-06-13 DIAGNOSIS — I779 Disorder of arteries and arterioles, unspecified: Secondary | ICD-10-CM

## 2015-06-13 DIAGNOSIS — N183 Chronic kidney disease, stage 3 unspecified: Secondary | ICD-10-CM

## 2015-06-13 DIAGNOSIS — F0390 Unspecified dementia without behavioral disturbance: Secondary | ICD-10-CM

## 2015-06-13 NOTE — Assessment & Plan Note (Signed)
Controlled.  

## 2015-06-13 NOTE — Progress Notes (Signed)
06/13/2015 ADRIENNE DELAY   January 01, 1929  [id number]  Primary Physician Ann Held, MD Primary Cardiologist: Dr [name]  HPI:  81 y/o male followed by Dr [name] with a history of CAD, s/p multiple PCI's in the past. Last cath was Feb 2016 and he had a PCI with DES to his RCA then. The LAD and CFX were patent then. His EF was 45-50%. He has a history of PAF- not on Coumadin secondary to a history of falls.   He saw Dr [name] in the office 05/17/15. He Had developed a nonhealing wound on the medial aspect of his left calf. The patient has had previous bilateral SFA stenting by Dr [name]. Dopplers revealed decrease in his ABI from 0.86 on the left with a patent SFA, and non-measurable ABI on Lt because of pain, but it suggested an occluded left SFA. Dr [name] felt he had critical limb ischemia and arranged for him to undergo angiography. This was done 05/22/15 and revealed a CTO of the Lt SFA and P1 segment of the popliteal artery with one vessel runoff. The Rt SFA had a patent, previously placed, stent. He underwent successful PTA and restenting of a CTO of the Lt SFA in the setting of critical limb ischemia. He tolerated this well. Post intervention we arranged for a wound care consult. The plan was for discharge within 24-48 hrs but the pt developed confusion. Discussion with the pt's wife revealed he has some degree of baseline confusion at home but he was worse during this admission. It became clear the pt would not be able to go home and SNF was recommended.                He is seen in the office today for follow up. He is receiving rehab at the SNF. He is being followed by a surgeon in [place]- Dr Arrie Aran for his leg wound and has been told that is doing OK. He still gets IV antibiotics.    Current Outpatient Prescriptions  Medication Sig Dispense Refill  . acetaminophen (TYLENOL) 500 MG tablet Take 1,000 mg by mouth every 6 (six) hours as needed for moderate pain.    Marland Kitchen albuterol  (PROVENTIL HFA;VENTOLIN HFA) 108 (90 BASE) MCG/ACT inhaler Inhale into the lungs every 6 (six) hours as needed for wheezing or shortness of breath. ProAir    . amLODipine (NORVASC) 10 MG tablet Take 10 mg by mouth daily.    . Ampicillin-Sulbactam (UNASYN) 3 (2-1) G SOLR injection Inject 3 g into the vein every 8 (eight) hours.    Marland Kitchen aspirin EC 81 MG tablet Take 81 mg by mouth at bedtime.     Marland Kitchen atorvastatin (LIPITOR) 40 MG tablet Take 1 tablet (40 mg total) by mouth at bedtime. 31 tablet 11  . clopidogrel (PLAVIX) 75 MG tablet Take 1 tablet (75 mg total) by mouth daily. 30 tablet 11  . DULoxetine (CYMBALTA) 60 MG capsule Take 60 mg by mouth daily.  0  . feeding supplement, ENSURE ENLIVE, (ENSURE ENLIVE) LIQD Take 237 mLs by mouth 2 (two) times daily between meals. 237 mL 12  . fexofenadine (ALLEGRA) 180 MG tablet Take 180 mg by mouth daily.    . furosemide (LASIX) 40 MG tablet Take 0.5 tablets (20 mg total) by mouth daily. 30 tablet 0  . isosorbide mononitrate (IMDUR) 30 MG 24 hr tablet Take 30 mg by mouth 2 (two) times daily.   0  . Ketotifen Fumarate (ALAWAY OP) Place 1 drop  into both eyes 2 (two) times daily as needed (dry eyes).    . Menthol, Topical Analgesic, (BIOFREEZE EX) Apply 1 application topically daily as needed (pain).    Marland Kitchen metolazone (ZAROXOLYN) 2.5 MG tablet Take 2.5 mg by mouth every other day as needed (for fluid). Every other day    . metoprolol succinate (TOPROL-XL) 50 MG 24 hr tablet Take 25-50 mg by mouth daily. Take 25 mg in the morning and 50 mg in the evening.    . mirtazapine (REMERON) 15 MG tablet Take 15 mg by mouth at bedtime.    . Multiple Vitamin (MULTIVITAMIN WITH MINERALS) TABS tablet Take 1 tablet by mouth daily.    . nitroGLYCERIN (NITROSTAT) 0.4 MG SL tablet Place 0.4 mg under the tongue every 5 (five) minutes as needed for chest pain.    . pantoprazole (PROTONIX) 40 MG tablet take 1 tablet by mouth once daily 30 tablet 6  . potassium chloride SA (K-DUR,KLOR-CON)  20 MEQ tablet Take 1 tablet (20 mEq total) by mouth daily. 30 tablet 11  . QUEtiapine (SEROQUEL) 50 MG tablet Take 50 mg by mouth daily before supper.   1  . RANEXA 1000 MG SR tablet Take 1,000 mg by mouth 2 (two) times daily.  0  . SANTYL ointment Apply 1 application topically daily.  0   No current facility-administered medications for this visit.    Allergies  Allergen Reactions  . Ciprofloxacin     unknown  . Ciprofloxacin Other (See Comments)    Unknown allergic reaction, possible shortness of breath  . Codeine     "knocked me out"  . Codeine Other (See Comments)    "knocked me out"  . Dexamethasone     unknown  . Dexamethasone Other (See Comments)    Unknown allergic reaction  . Sulfa Antibiotics Nausea And Vomiting  . Sulfonamide Derivatives     Unknown reaction  . Telmisartan     unknown  . Telmisartan Other (See Comments)    Micardis - heart races    Social History   Social History  . Marital Status: Married    Spouse Name: N/A  . Number of Children: N/A  . Years of Education: N/A   Occupational History  . 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 Smoking status: Former Smoker -- 0.75 packs/day for 20 years    Types: Cigarettes    Quit date: 09/23/1973  . Smokeless tobacco: Former Systems developer    Types: Snuff, Chew     Comment: tried snuff and chew as a child (65yr) did not continue  . Alcohol Use: Yes     Comment: 05/22/2015 "seldom-- every three or four years"  . Drug Use: No  . Sexual Activity: Not Currently   Other Topics Concern  . Not on file   Social History Narrative   ** Merged History Encounter **         Review of Systems: General: negative for chills, fever, night sweats or weight changes.  Cardiovascular: negative for chest pain, dyspnea on exertion, edema, orthopnea, palpitations, paroxysmal nocturnal dyspnea or shortness of breath Dermatological: negative for rash Respiratory: negative for cough or  wheezing Urologic: negative for hematuria Abdominal: negative for nausea, vomiting, diarrhea, bright red blood per rectum, melena, or hematemesis Neurologic: negative for visual changes, syncope, or dizziness All other systems reviewed and are otherwise negative except as noted above.    Blood pressure 132/50, height 5\' 7"  (1.702 m), weight 192 lb  1.6 oz (87.136 kg).  General appearance: alert, cooperative, no distress and in wheel chair Neck: no carotid bruit and no JVD Lungs: clear to auscultation bilaterally Extremities: 1-2+ bilat edema with wound dressing on Lt calf Skin: cool pale and dry Neurologic: Grossly normal   ASSESSMENT AND PLAN:   PAD, history of stenting to both SFAs with Lt SFA PTA 05/22/15 Lt SFA PTA 2/91/91 complicated by acute delirium, UTI, SNF placement  PAF- CHADs VASc = 4. Not on anticoagulation secondary to hx of falls NSR during recent admission  CAD S/P multiple PCI's, last was RCA DES Feb 2016 No angina  COPD (chronic obstructive pulmonary disease) Severe obstructive and restrictive defects.  Essential hypertension Controlled  Dementia .  Chronic renal insufficiency, stage III (moderate) GFR 49   PLAN  Document f/u dopplers LLE. F/U Dr [name] 3 months.  [name] K PA-C 06/13/2015 4:01 PM

## 2015-06-13 NOTE — Patient Instructions (Signed)
Schedule left lower ext dopplers    Schedule appointment with Dr.Berry in 3 months

## 2015-06-13 NOTE — Assessment & Plan Note (Signed)
NSR during recent admission

## 2015-06-13 NOTE — Assessment & Plan Note (Signed)
No angina 

## 2015-06-13 NOTE — Assessment & Plan Note (Signed)
GFR 49 

## 2015-06-13 NOTE — Patient Outreach (Signed)
[facility]) Care Management  [facility] Social Work  06/13/2015  JEREMIAN WHITBY 1929-07-03 [id number]  Subjective:    Objective:   Current Medications:  Current Outpatient Prescriptions  Medication Sig Dispense Refill  . acetaminophen (TYLENOL) 500 MG tablet Take 1,000 mg by mouth every 6 (six) hours as needed for moderate pain.    Marland Kitchen albuterol (PROVENTIL HFA;VENTOLIN HFA) 108 (90 BASE) MCG/ACT inhaler Inhale into the lungs every 6 (six) hours as needed for wheezing or shortness of breath. ProAir    . amLODipine (NORVASC) 2.5 MG tablet Take 1 tablet (2.5 mg total) by mouth daily. 30 tablet 11  . aspirin EC 81 MG tablet Take 81 mg by mouth at bedtime.     Marland Kitchen atorvastatin (LIPITOR) 40 MG tablet Take 1 tablet (40 mg total) by mouth at bedtime. 31 tablet 11  . clopidogrel (PLAVIX) 75 MG tablet Take 1 tablet (75 mg total) by mouth daily. 30 tablet 11  . DULoxetine (CYMBALTA) 60 MG capsule Take 60 mg by mouth daily.  0  . feeding supplement, ENSURE ENLIVE, (ENSURE ENLIVE) LIQD Take 237 mLs by mouth 2 (two) times daily between meals. 237 mL 12  . fexofenadine (ALLEGRA) 180 MG tablet Take 180 mg by mouth daily.    . furosemide (LASIX) 40 MG tablet Take 0.5 tablets (20 mg total) by mouth daily. 30 tablet 0  . isosorbide mononitrate (IMDUR) 30 MG 24 hr tablet Take 30 mg by mouth 2 (two) times daily.   0  . Ketotifen Fumarate (ALAWAY OP) Place 1 drop into both eyes 2 (two) times daily as needed (dry eyes).    . Menthol, Topical Analgesic, (BIOFREEZE EX) Apply 1 application topically daily as needed (pain).    Marland Kitchen metolazone (ZAROXOLYN) 2.5 MG tablet Take 2.5 mg by mouth every other day as needed (for fluid). Every other day    . metoprolol succinate (TOPROL-XL) 50 MG 24 hr tablet Take 25-50 mg by mouth daily. Take 25 mg in the morning and 50 mg in the evening.    . Multiple Vitamin (MULTIVITAMIN WITH MINERALS) TABS tablet Take 1 tablet by mouth daily.    . nitroGLYCERIN (NITROSTAT) 0.4 MG  SL tablet Place 0.4 mg under the tongue every 5 (five) minutes as needed for chest pain.    . pantoprazole (PROTONIX) 40 MG tablet take 1 tablet by mouth once daily 30 tablet 6  . potassium chloride SA (K-DUR,KLOR-CON) 20 MEQ tablet Take 1 tablet (20 mEq total) by mouth daily. 30 tablet 11  . QUEtiapine (SEROQUEL) 50 MG tablet Take 50 mg by mouth daily before supper.   1  . RANEXA 1000 MG SR tablet Take 1,000 mg by mouth 2 (two) times daily.  0  . SANTYL ointment Apply 1 application topically daily.  0   No current facility-administered medications for this visit.    Functional Status:  In your present state of health, do you have any difficulty performing the following activities: 05/22/2015 05/22/2015  Hearing? - N  Vision? - Y  Difficulty concentrating or making decisions? - Y  Walking or climbing stairs? - Y  Dressing or bathing? - N  Doing errands, shopping? Y -  Conservation officer, nature and eating ? - -  Using the Toilet? - -  In the past six months, have you accidently leaked urine? - -  Do you have problems with loss of bowel control? - -  Managing your Medications? - -  Managing your Finances? - -  Housekeeping  or managing your Housekeeping? - -    Fall/Depression Screening:  PHQ 2/9 Scores 03/28/2015 02/03/2015  PHQ - 2 Score 0 0    Assessment:  Met with patient and his wife in patient's room at [facility].  Patient was preparing to be transported to see Dr. Rosalyn Gess today at 3:30pm.  Per patient's wife, patient saw the wound surgeon  last week in the  facility and wound is doing well.  Patient's wife reports  less confusion.  Per patient's spouse, patient is on the same medications from home.  This Education officer, museum spoke with the social worker at the facility, who stated that there has been no anticipated discharge date set yet.  They will have a treatment team meeting tomorrow and will probably have a discharge date set at that time.  Plan: This Education officer, museum will  follow up with patient in 1 week in regards to discharge plan and anticipated discharge needs.  Social work note to be routed to primary care doctor.    THN CM Care Plan Problem One        Most Recent Value   Care Plan Problem One  currently in SNF   Role Documenting the Problem One  Clinical Social Worker   Care Plan for Problem One  Active   THN CM Short Term Goal #1 (0-30 days)  patient witll actively participate in PT and OT for the next 30 days   THN CM Short Term Goal #1 Start Date  06/13/15   Interventions for Short Term Goal #1  LCSW encouraged active participation in PT and OT to increase mobility and strength in his legs   THN CM Short Term Goal #2 (0-30 days)  patient and LCSWwill work with the discharge planner to ensure safe and stabel dishrage back home for next 30 days   THN CM Short Term Goal #2 Start Date  06/13/15   Interventions for Short Term Goal #2  spoke with discharge planner regarding anticipated discharge  date for patient         Sheralyn Boatman [facility] Care Management [phone]

## 2015-06-13 NOTE — Assessment & Plan Note (Signed)
Severe obstructive and restrictive defects.

## 2015-06-13 NOTE — Assessment & Plan Note (Signed)
Lt SFA PTA 4/71/85 complicated by acute delirium, UTI, SNF placement

## 2015-06-15 ENCOUNTER — Other Ambulatory Visit: Payer: Self-pay | Admitting: Cardiology

## 2015-06-15 DIAGNOSIS — I739 Peripheral vascular disease, unspecified: Secondary | ICD-10-CM

## 2015-06-17 ENCOUNTER — Other Ambulatory Visit (HOSPITAL_COMMUNITY): Payer: Self-pay | Admitting: Cardiovascular Disease

## 2015-06-19 NOTE — Telephone Encounter (Signed)
Rx request sent to pharmacy.  

## 2015-06-22 ENCOUNTER — Ambulatory Visit (HOSPITAL_COMMUNITY)
Admission: RE | Admit: 2015-06-22 | Discharge: 2015-06-22 | Disposition: A | Discharge status: Home / Self Care | Payer: No Typology Code available for payment source | Source: Ambulatory Visit | Attending: Cardiology | Admitting: Cardiology

## 2015-06-22 ENCOUNTER — Other Ambulatory Visit: Payer: Self-pay | Admitting: *Deleted

## 2015-06-22 DIAGNOSIS — F17200 Nicotine dependence, unspecified, uncomplicated: Secondary | ICD-10-CM | POA: Insufficient documentation

## 2015-06-22 DIAGNOSIS — E785 Hyperlipidemia, unspecified: Secondary | ICD-10-CM | POA: Insufficient documentation

## 2015-06-22 DIAGNOSIS — I739 Peripheral vascular disease, unspecified: Secondary | ICD-10-CM | POA: Diagnosis not present

## 2015-06-22 DIAGNOSIS — I2510 Atherosclerotic heart disease of native coronary artery without angina pectoris: Secondary | ICD-10-CM | POA: Insufficient documentation

## 2015-06-22 DIAGNOSIS — I70202 Unspecified atherosclerosis of native arteries of extremities, left leg: Secondary | ICD-10-CM | POA: Insufficient documentation

## 2015-06-22 DIAGNOSIS — I10 Essential (primary) hypertension: Secondary | ICD-10-CM | POA: Insufficient documentation

## 2015-06-22 NOTE — Patient Outreach (Addendum)
[facility]) Care Management  [facility] Social Work  06/22/2015  Gavin [name] 04-13-1929 [id number]  Subjective:  "I should be discharging in the next couple of weeks"  " I go to physical therapy daily"  Objective:   Current Medications:  Current Outpatient Prescriptions  Medication Sig Dispense Refill  . acetaminophen (TYLENOL) 500 MG tablet Take 1,000 mg by mouth every 6 (six) hours as needed for moderate pain.    Marland Kitchen albuterol (PROVENTIL HFA;VENTOLIN HFA) 108 (90 BASE) MCG/ACT inhaler Inhale into the lungs every 6 (six) hours as needed for wheezing or shortness of breath. ProAir    . amLODipine (NORVASC) 10 MG tablet Take 10 mg by mouth daily.    . Ampicillin-Sulbactam (UNASYN) 3 (2-1) G SOLR injection Inject 3 g into the vein every 8 (eight) hours.    Marland Kitchen aspirin EC 81 MG tablet Take 81 mg by mouth at bedtime.     Marland Kitchen atorvastatin (LIPITOR) 40 MG tablet Take 1 tablet (40 mg total) by mouth at bedtime. 31 tablet 11  . clopidogrel (PLAVIX) 75 MG tablet Take 1 tablet (75 mg total) by mouth daily. 30 tablet 11  . DULoxetine (CYMBALTA) 60 MG capsule Take 60 mg by mouth daily.  0  . feeding supplement, ENSURE ENLIVE, (ENSURE ENLIVE) LIQD Take 237 mLs by mouth 2 (two) times daily between meals. 237 mL 12  . fexofenadine (ALLEGRA) 180 MG tablet Take 180 mg by mouth daily.    . furosemide (LASIX) 40 MG tablet Take 0.5 tablets (20 mg total) by mouth daily. 30 tablet 0  . isosorbide mononitrate (IMDUR) 30 MG 24 hr tablet Take 30 mg by mouth 2 (two) times daily.   0  . Ketotifen Fumarate (ALAWAY OP) Place 1 drop into both eyes 2 (two) times daily as needed (dry eyes).    . Menthol, Topical Analgesic, (BIOFREEZE EX) Apply 1 application topically daily as needed (pain).    Marland Kitchen metolazone (ZAROXOLYN) 2.5 MG tablet Take 2.5 mg by mouth every other day as needed (for fluid). Every other day    . metoprolol succinate (TOPROL-XL) 50 MG 24 hr tablet Take 25-50 mg by mouth daily. Take 25 mg in the  morning and 50 mg in the evening.    . mirtazapine (REMERON) 15 MG tablet Take 15 mg by mouth at bedtime.    . Multiple Vitamin (MULTIVITAMIN WITH MINERALS) TABS tablet Take 1 tablet by mouth daily.    . nitroGLYCERIN (NITROSTAT) 0.4 MG SL tablet Place 0.4 mg under the tongue every 5 (five) minutes as needed for chest pain.    . pantoprazole (PROTONIX) 40 MG tablet take 1 tablet by mouth once daily 30 tablet 6  . potassium chloride SA (K-DUR,KLOR-CON) 20 MEQ tablet Take 1 tablet (20 mEq total) by mouth daily. 30 tablet 11  . QUEtiapine (SEROQUEL) 50 MG tablet Take 50 mg by mouth daily before supper.   1  . RANEXA 1000 MG SR tablet Take 1,000 mg by mouth 2 (two) times daily.  0  . SANTYL ointment Apply 1 application topically daily.  0   No current facility-administered medications for this visit.    Functional Status:  In your present state of health, do you have any difficulty performing the following activities: 05/22/2015 05/22/2015  Hearing? - N  Vision? - Y  Difficulty concentrating or making decisions? - Y  Walking or climbing stairs? - Y  Dressing or bathing? - N  Doing errands, shopping? [place]  and eating ? - -  Using the Toilet? - -  In the past six months, have you accidently leaked urine? - -  Do you have problems with loss of bowel control? - -  Managing your Medications? - -  Managing your Finances? - -  Housekeeping or managing your Housekeeping? - -    Fall/Depression Screening:  PHQ 2/9 Scores 03/28/2015 02/03/2015  PHQ - 2 Score 0 0    Assessment:  This Education officer, museum met with patient at [facility].  Patient stated that he recently came back from a cardiology appointment and was waiting to go to physical therapy. Patient was sitting in his wheelchair, Rolator at bedside as well. Per patient he actively participates in physical therapy and feels that it is working.  Patient's  wife is very supportive and continues to visit daily.   Did  not complete  Medication review, patient not sure if his medications were changed not.  This social worker spoke to discharge [place] who confirmed that patient's discharge is set for 06/01/15 and will be sent home with a list of his medications and home health services through [facility]. Plan:  This social worker will inform RNCM of patient's discharge date.             This Education officer, museum will follow up with patient post discharge.    THN CM Care Plan Problem One        Most Recent Value   Care Plan Problem One  currently in SNF   Role Documenting the Problem One  Clinical Social Worker   Care Plan for Problem One  Active   THN CM Short Term Goal #1 (0-30 days)  patient witll actively participate in PT and OT for the next 30 days   THN CM Short Term Goal #1 Start Date  06/13/15   Interventions for Short Term Goal #1  LCSW encouraged active participation in PT and OT to increase mobility and strength in his legs   THN CM Short Term Goal #2 (0-30 days)  patient and LCSWwill work with the discharge planner to ensure safe and stabel dishrage back home for next 30 days   THN CM Short Term Goal #2 Start Date  06/13/15   Interventions for Short Term Goal #2  spoke with discharge planner tentative discharge date 07/01/15 with [place] Care Management [phone]

## 2015-06-27 IMAGING — CR DG CHEST 1V PORT
1 series · 1 of 1 positions shown · non-contrast
Comparison: Chest x-ray 09/11/2012.

CLINICAL DATA: Chest pain.

PORTABLE CHEST - 1 VIEW

[AP]
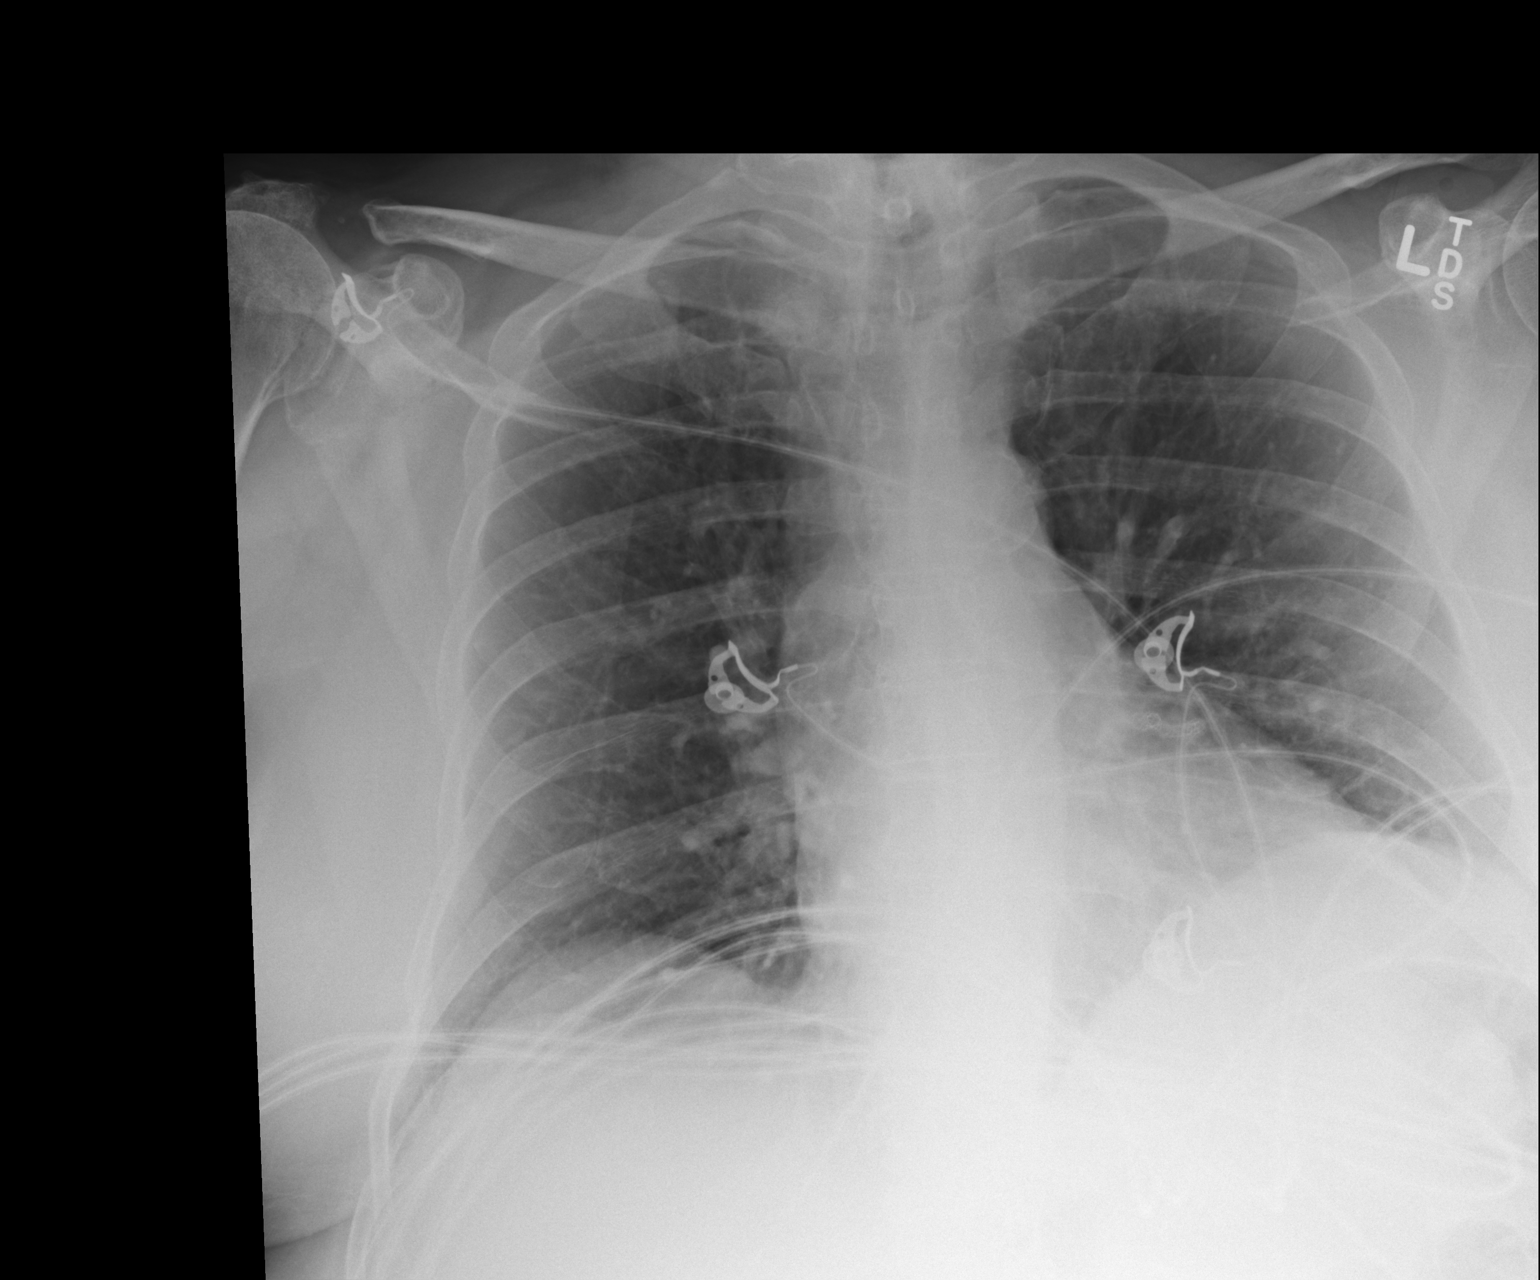

[1 of 1 positions shown; findings below may reference images not displayed]

FINDINGS: Lung volumes are low.  Mild elevation of the left
hemidiaphragm is unchanged.  No acute consolidative airspace
disease.  No pleural effusion.  No evidence of pulmonary edema.
Heart size is normal.  Mediastinal contours are unremarkable.
Atherosclerosis in the thoracic aorta.  Coronary artery stent
incidentally noted.
IMPRESSION: 1.  Low lung volumes without definite radiographic evidence of
acute cardiopulmonary disease.
2.  Atherosclerosis status post PTCI to a coronary artery (likely
the LAD).

## 2015-07-02 DIAGNOSIS — I509 Heart failure, unspecified: Secondary | ICD-10-CM | POA: Diagnosis not present

## 2015-07-02 DIAGNOSIS — Z7982 Long term (current) use of aspirin: Secondary | ICD-10-CM | POA: Diagnosis not present

## 2015-07-02 DIAGNOSIS — F0151 Vascular dementia with behavioral disturbance: Secondary | ICD-10-CM | POA: Diagnosis not present

## 2015-07-02 DIAGNOSIS — I110 Hypertensive heart disease with heart failure: Secondary | ICD-10-CM | POA: Diagnosis not present

## 2015-07-02 DIAGNOSIS — R2681 Unsteadiness on feet: Secondary | ICD-10-CM | POA: Diagnosis not present

## 2015-07-02 DIAGNOSIS — I252 Old myocardial infarction: Secondary | ICD-10-CM | POA: Diagnosis not present

## 2015-07-02 DIAGNOSIS — I2510 Atherosclerotic heart disease of native coronary artery without angina pectoris: Secondary | ICD-10-CM | POA: Diagnosis not present

## 2015-07-02 DIAGNOSIS — Z7902 Long term (current) use of antithrombotics/antiplatelets: Secondary | ICD-10-CM | POA: Diagnosis not present

## 2015-07-02 DIAGNOSIS — Z79891 Long term (current) use of opiate analgesic: Secondary | ICD-10-CM | POA: Diagnosis not present

## 2015-07-02 DIAGNOSIS — L97321 Non-pressure chronic ulcer of left ankle limited to breakdown of skin: Secondary | ICD-10-CM | POA: Diagnosis not present

## 2015-07-02 DIAGNOSIS — Z9181 History of falling: Secondary | ICD-10-CM | POA: Diagnosis not present

## 2015-07-02 DIAGNOSIS — F3289 Other specified depressive episodes: Secondary | ICD-10-CM | POA: Diagnosis not present

## 2015-07-02 DIAGNOSIS — F29 Unspecified psychosis not due to a substance or known physiological condition: Secondary | ICD-10-CM | POA: Diagnosis not present

## 2015-07-02 DIAGNOSIS — I4891 Unspecified atrial fibrillation: Secondary | ICD-10-CM | POA: Diagnosis not present

## 2015-07-03 ENCOUNTER — Other Ambulatory Visit: Payer: Self-pay | Admitting: *Deleted

## 2015-07-03 NOTE — Patient Outreach (Signed)
[facility]) Care Management  07/03/2015  Gavin [name] 1928/12/09 [id number]   Transition of care call #1 Per Social worker note Gavin [name] anticipated discharge date from [facility] SNF was 10/8. Unsuccessful attempt to reach patient or his wife by dialing home and cell phone number times 2 on today. I have left a message for them to return phone call if no response from them I will attempt outreach on 10/11 and also confirm with [place] discharge date.  Joylene Draft, RN, [place] Management [phone]- Mobile [phone]- Toll Free Main Office

## 2015-07-04 ENCOUNTER — Other Ambulatory Visit: Payer: Self-pay | Admitting: *Deleted

## 2015-07-04 ENCOUNTER — Encounter: Payer: Self-pay | Admitting: *Deleted

## 2015-07-04 DIAGNOSIS — I4891 Unspecified atrial fibrillation: Secondary | ICD-10-CM | POA: Diagnosis not present

## 2015-07-04 DIAGNOSIS — I509 Heart failure, unspecified: Secondary | ICD-10-CM | POA: Diagnosis not present

## 2015-07-04 DIAGNOSIS — I110 Hypertensive heart disease with heart failure: Secondary | ICD-10-CM | POA: Diagnosis not present

## 2015-07-04 DIAGNOSIS — L97321 Non-pressure chronic ulcer of left ankle limited to breakdown of skin: Secondary | ICD-10-CM | POA: Diagnosis not present

## 2015-07-04 DIAGNOSIS — I2510 Atherosclerotic heart disease of native coronary artery without angina pectoris: Secondary | ICD-10-CM | POA: Diagnosis not present

## 2015-07-04 DIAGNOSIS — R2681 Unsteadiness on feet: Secondary | ICD-10-CM | POA: Diagnosis not present

## 2015-07-04 NOTE — Patient Outreach (Signed)
[facility]) Care Management  07/04/2015  CLETO CLAGGETT 21-Dec-1928 [id number]    Phone call to patient, spoke with his spouse who confirmed that patient discharged on 07/01/15 with home health through [facility].  Per patient's spouse, he has his medications and has a follow up appointment with his his primary care doctor on 07/05/15 at 2:15pm.   Spouse verbalized no further social work needs at this time.  Case to be closed to social work. RNCM to be notified.   Sheralyn Boatman [facility] Care Management [phone]

## 2015-07-04 NOTE — Patient Outreach (Addendum)
[facility]) Care Management  07/04/2015  Gavin [name] [birth year] [id number]  Transition of care - Week 1 Gavin [name] was discharged from [facility] on Saturday, October 8.  Telephone call to Gavin [name], able to speak with his wife Gavin [name], She reports that patient is doing on okay, but more dependant on her, for task such as reminding him to take his morning medication that is in the location it always is that she has prepared. She reports that he takes his medication daily as she has prepared it with reminders. Gavin [name] has a scheduled post discharge visit scheduled with Dr.Scott on October 12, wife will provide transportation. Gavin [name] is followed by [place], RN and physical therapist, the RN is visiting patient currently.   Plan: Explain the transition of care 4 week call, visit process Schedule transition of care call for next week Send Dr.Scott, updated involvement barrier letter  [facility] CM Care Plan Problem One        Most Recent Value   Care Plan Problem One  [place] followed by Skilled Rehab Admission   Role Documenting the Problem One  Care Management [place] for Problem One  Active   THN Long Term Goal (31-90 days)  Patient will not experience a hospital admission in the next 31 days   [place] Term Goal Start Date  07/04/15   Interventions for Problem One Long Term Goal  Discussed importance of  timely follow up  with PCP after discharged, has appoiintment for 10/12, and to call MD for concerns   THN CM Short Term Goal #1 (0-30 days)  Patient will not experience a hospital admission in the next 30 days   THN CM Short Term Goal #1 Start Date  07/04/15   Interventions for Short Term Goal #1  Notify PCP ,of concerns as arrange and arrange a [place] office visit    Joylene Draft, RN, [place] Management [phone]- Mobile [phone]- [place]

## 2015-07-05 DIAGNOSIS — R0689 Other abnormalities of breathing: Secondary | ICD-10-CM | POA: Diagnosis not present

## 2015-07-05 DIAGNOSIS — I509 Heart failure, unspecified: Secondary | ICD-10-CM | POA: Diagnosis not present

## 2015-07-05 DIAGNOSIS — L97321 Non-pressure chronic ulcer of left ankle limited to breakdown of skin: Secondary | ICD-10-CM | POA: Diagnosis not present

## 2015-07-05 DIAGNOSIS — I2510 Atherosclerotic heart disease of native coronary artery without angina pectoris: Secondary | ICD-10-CM | POA: Diagnosis not present

## 2015-07-05 DIAGNOSIS — I110 Hypertensive heart disease with heart failure: Secondary | ICD-10-CM | POA: Diagnosis not present

## 2015-07-05 DIAGNOSIS — S81802S Unspecified open wound, left lower leg, sequela: Secondary | ICD-10-CM | POA: Diagnosis not present

## 2015-07-05 DIAGNOSIS — I4891 Unspecified atrial fibrillation: Secondary | ICD-10-CM | POA: Diagnosis not present

## 2015-07-05 DIAGNOSIS — R2681 Unsteadiness on feet: Secondary | ICD-10-CM | POA: Diagnosis not present

## 2015-07-06 DIAGNOSIS — I2510 Atherosclerotic heart disease of native coronary artery without angina pectoris: Secondary | ICD-10-CM | POA: Diagnosis not present

## 2015-07-06 DIAGNOSIS — I110 Hypertensive heart disease with heart failure: Secondary | ICD-10-CM | POA: Diagnosis not present

## 2015-07-06 DIAGNOSIS — I509 Heart failure, unspecified: Secondary | ICD-10-CM | POA: Diagnosis not present

## 2015-07-06 DIAGNOSIS — I4891 Unspecified atrial fibrillation: Secondary | ICD-10-CM | POA: Diagnosis not present

## 2015-07-06 DIAGNOSIS — R2681 Unsteadiness on feet: Secondary | ICD-10-CM | POA: Diagnosis not present

## 2015-07-06 DIAGNOSIS — L97321 Non-pressure chronic ulcer of left ankle limited to breakdown of skin: Secondary | ICD-10-CM | POA: Diagnosis not present

## 2015-07-07 DIAGNOSIS — I2510 Atherosclerotic heart disease of native coronary artery without angina pectoris: Secondary | ICD-10-CM | POA: Diagnosis not present

## 2015-07-07 DIAGNOSIS — I509 Heart failure, unspecified: Secondary | ICD-10-CM | POA: Diagnosis not present

## 2015-07-07 DIAGNOSIS — I4891 Unspecified atrial fibrillation: Secondary | ICD-10-CM | POA: Diagnosis not present

## 2015-07-07 DIAGNOSIS — R2681 Unsteadiness on feet: Secondary | ICD-10-CM | POA: Diagnosis not present

## 2015-07-07 DIAGNOSIS — L97321 Non-pressure chronic ulcer of left ankle limited to breakdown of skin: Secondary | ICD-10-CM | POA: Diagnosis not present

## 2015-07-07 DIAGNOSIS — I110 Hypertensive heart disease with heart failure: Secondary | ICD-10-CM | POA: Diagnosis not present

## 2015-07-10 DIAGNOSIS — I110 Hypertensive heart disease with heart failure: Secondary | ICD-10-CM | POA: Diagnosis not present

## 2015-07-10 DIAGNOSIS — I2510 Atherosclerotic heart disease of native coronary artery without angina pectoris: Secondary | ICD-10-CM | POA: Diagnosis not present

## 2015-07-10 DIAGNOSIS — I4891 Unspecified atrial fibrillation: Secondary | ICD-10-CM | POA: Diagnosis not present

## 2015-07-10 DIAGNOSIS — L97321 Non-pressure chronic ulcer of left ankle limited to breakdown of skin: Secondary | ICD-10-CM | POA: Diagnosis not present

## 2015-07-10 DIAGNOSIS — R2681 Unsteadiness on feet: Secondary | ICD-10-CM | POA: Diagnosis not present

## 2015-07-10 DIAGNOSIS — I509 Heart failure, unspecified: Secondary | ICD-10-CM | POA: Diagnosis not present

## 2015-07-12 DIAGNOSIS — L97321 Non-pressure chronic ulcer of left ankle limited to breakdown of skin: Secondary | ICD-10-CM | POA: Diagnosis not present

## 2015-07-12 DIAGNOSIS — I2510 Atherosclerotic heart disease of native coronary artery without angina pectoris: Secondary | ICD-10-CM | POA: Diagnosis not present

## 2015-07-12 DIAGNOSIS — I4891 Unspecified atrial fibrillation: Secondary | ICD-10-CM | POA: Diagnosis not present

## 2015-07-12 DIAGNOSIS — R2681 Unsteadiness on feet: Secondary | ICD-10-CM | POA: Diagnosis not present

## 2015-07-12 DIAGNOSIS — I110 Hypertensive heart disease with heart failure: Secondary | ICD-10-CM | POA: Diagnosis not present

## 2015-07-12 DIAGNOSIS — I509 Heart failure, unspecified: Secondary | ICD-10-CM | POA: Diagnosis not present

## 2015-07-13 ENCOUNTER — Other Ambulatory Visit: Payer: Self-pay | Admitting: *Deleted

## 2015-07-13 NOTE — Patient Outreach (Signed)
[place] [facility]) Care Management  07/13/2015  ZACHORY MANGUAL Dec 13, 1928 [id number]   Transition of care week #2  Telephone call to Mr. Jahking Lesser, spoke with his wife karen, she reports that patient is doing some better, he is up and dressed to attend a hearing aide appointment on today.  She reports that he is still followed by [facility], RN 3 days a week for wound care and states that wound is continuing to heal, Mrs. Preece changes the dressing 2 days per week. Per Mrs. Arther patient has gained some physical strength and his last day with home physical therapy will be on 10/21, he continues to use his rolling walker.  Mrs. Carchi reports that patient is taking his medication as prescribed without problems, and reports pain control improved at left leg wound site.   Mrs. Nienhaus reports that Mr. Dearden has decreased appetite and his weight today 178#, she states that he drinks one boost a day, suggested that she try adding another boost during the day. She reports that patient is sleeping better at night.  Plan: Continue transition of care call on next week and home visit scheduled for November 3. Patient will continue to use his rolling walker for fall prevention Patient with decreased appetite, wife will try to add 2 boost per day.   [facility] CM Care Plan Problem One        Most Recent Value   Care Plan Problem One  [place] followed by Skilled Rehab Admission   Role Documenting the Problem One  Care Management [place] for Problem One  Active   THN Long Term Goal (31-90 days)  Patient will not experience a hospital admission in the next 31 days   THN Long Term Goal Start Date  07/04/15   Interventions for Problem One Long Term Goal  Discussed importance of  timely follow up  with PCP after discharged,  and to call MD for concerns   THN CM Short Term Goal #1 (0-30 days)  Patient will not experience a hospital admission in the next 30  days   THN CM Short Term Goal #1 Start Date  07/04/15   Interventions for Short Term Goal #1  Notify PCP ,of concerns and arrange a earlier office visit    [facility] CM Care Plan Problem Two        Most Recent Value   Care Plan Problem Two  Self Care Management of Hypertension   Role Documenting the Problem Two  Care Management [place] for Problem Two  Active   THN Long Term Goal (31-90) days  Patient and wife will verbalize understanding Hypertension self care monitoring importance   THN Long Term Goal Start Date  02/13/15   THN Long Term Goal Met Date  03/28/15   THN CM Short Term Goal #1 (0-30 days)  Patient will have his blood  pressure checked and recorded by his wife at least 3 days per week within the next 30 days    THN CM Short Term Goal #1 Start Date  02/13/15   [facility] CM Short Term Goal #1 Met Date   03/28/15   THN CM Short Term Goal #2 (0-30 days)  Patient will report weighing self daily and recording within the next 30 days.   THN CM Short Term Goal #2 Start Date  02/13/15   [facility] CM Short Term Goal #2 Met Date  03/28/15   Interventions for  Short Term Goal #2  Discussed importance of maintaining a healthy weight  with hypertension, and monitoring salt and fat intake, and notifying MD of weight gain greater than 5 pounds in one week, or sob.    [facility] CM Care Plan Problem Three        Most Recent Value   Care Plan Problem Three  Wound Left Leg   Role Documenting the Problem Three  Care Management Coordinator   Care Plan for Problem Three  Active   THN Long Term Goal (31-90) days  Patient will experience wound healing in the next 31 days   THN Long Term Goal Start Date  07/13/15 [goal date restarted]   Interventions for Problem Three Long Term Goal  discussed importance of diet in wound healing, suggested trying to 2 boost to his daily intake  on days when appetite decreased    THN CM Short Term Goal #1 (0-30 days)  Patient will verbalize and demostrate importance of MD  follow up on  Wounds   THN CM Short Term Goal #1 Start Date  03/28/15   [facility] CM Short Term Goal #1 Met Date  07/13/15   THN CM Short Term Goal #2 (0-30 days)  Patient will keep legs elevated during the day while sitting in his chair as much as tolerated   THN CM Short Term Goal #2 Start Date  07/13/15 [goal date restarted]   Interventions for Short Term Goal #2  Discussed leg elevation help with reducing swelling, helps blood flow in the legs   THN CM Short Term Goal #3 (0-30 days)  Patient will state improved pain control   THN  CM Short Term Goal #3 Start Date  05/11/15 [goal date restarted]   [facility] CM Short Term Goal #3 Met Date  07/13/15     Joylene Draft, RN, [place] Management [phone]- Mobile [phone]- [place]

## 2015-07-14 DIAGNOSIS — I4891 Unspecified atrial fibrillation: Secondary | ICD-10-CM | POA: Diagnosis not present

## 2015-07-14 DIAGNOSIS — I2510 Atherosclerotic heart disease of native coronary artery without angina pectoris: Secondary | ICD-10-CM | POA: Diagnosis not present

## 2015-07-14 DIAGNOSIS — I509 Heart failure, unspecified: Secondary | ICD-10-CM | POA: Diagnosis not present

## 2015-07-14 DIAGNOSIS — I110 Hypertensive heart disease with heart failure: Secondary | ICD-10-CM | POA: Diagnosis not present

## 2015-07-14 DIAGNOSIS — R2681 Unsteadiness on feet: Secondary | ICD-10-CM | POA: Diagnosis not present

## 2015-07-14 DIAGNOSIS — L97321 Non-pressure chronic ulcer of left ankle limited to breakdown of skin: Secondary | ICD-10-CM | POA: Diagnosis not present

## 2015-07-17 DIAGNOSIS — R2681 Unsteadiness on feet: Secondary | ICD-10-CM | POA: Diagnosis not present

## 2015-07-17 DIAGNOSIS — I4891 Unspecified atrial fibrillation: Secondary | ICD-10-CM | POA: Diagnosis not present

## 2015-07-17 DIAGNOSIS — I509 Heart failure, unspecified: Secondary | ICD-10-CM | POA: Diagnosis not present

## 2015-07-17 DIAGNOSIS — L97321 Non-pressure chronic ulcer of left ankle limited to breakdown of skin: Secondary | ICD-10-CM | POA: Diagnosis not present

## 2015-07-17 DIAGNOSIS — I2510 Atherosclerotic heart disease of native coronary artery without angina pectoris: Secondary | ICD-10-CM | POA: Diagnosis not present

## 2015-07-17 DIAGNOSIS — I110 Hypertensive heart disease with heart failure: Secondary | ICD-10-CM | POA: Diagnosis not present

## 2015-07-19 ENCOUNTER — Other Ambulatory Visit: Payer: Self-pay | Admitting: *Deleted

## 2015-07-19 DIAGNOSIS — L97321 Non-pressure chronic ulcer of left ankle limited to breakdown of skin: Secondary | ICD-10-CM | POA: Diagnosis not present

## 2015-07-19 DIAGNOSIS — I110 Hypertensive heart disease with heart failure: Secondary | ICD-10-CM | POA: Diagnosis not present

## 2015-07-19 DIAGNOSIS — I2510 Atherosclerotic heart disease of native coronary artery without angina pectoris: Secondary | ICD-10-CM | POA: Diagnosis not present

## 2015-07-19 DIAGNOSIS — R2681 Unsteadiness on feet: Secondary | ICD-10-CM | POA: Diagnosis not present

## 2015-07-19 DIAGNOSIS — I4891 Unspecified atrial fibrillation: Secondary | ICD-10-CM | POA: Diagnosis not present

## 2015-07-19 DIAGNOSIS — I509 Heart failure, unspecified: Secondary | ICD-10-CM | POA: Diagnosis not present

## 2015-07-19 NOTE — Patient Outreach (Signed)
[place] [facility]) Care Management  07/19/2015  Gavin [name] 05/19/1929 [id number]   Transition of Care Call week #3 Telephone call to [name], I was able to speak with his wife [name], she reports that patient is doing better, his appetite has picked up in the last few days. She states that his weight today is 176 pounds, down 2 pounds from last week she reports that he is still drinking at least  1 boost drink per day. She plans to notify Dr.Scott Friday if weight continues to decrease.  [name] is still followed by [place] RN,he has completed home health physical therapy, and [name] states that he is doing "pretty good" with getting around with his walker at home and taking his medication with assistance as prescribed. [name] continues to check his blood pressure and states "no problems".  [name] denies any further concerns, and encouraged to notify me or PCP with questions.  Plan Patient will continue with daily weights, will discuss with MD with weight continues to decrease  RN Care management coordinator plan home visit for November 4 Encouraged wife to try to add 2 boost per day.  Joylene Draft, RN, [place] Management [phone]- Mobile [phone]- Toll Free Main Office

## 2015-07-21 DIAGNOSIS — I110 Hypertensive heart disease with heart failure: Secondary | ICD-10-CM | POA: Diagnosis not present

## 2015-07-21 DIAGNOSIS — R2681 Unsteadiness on feet: Secondary | ICD-10-CM | POA: Diagnosis not present

## 2015-07-21 DIAGNOSIS — I4891 Unspecified atrial fibrillation: Secondary | ICD-10-CM | POA: Diagnosis not present

## 2015-07-21 DIAGNOSIS — I2510 Atherosclerotic heart disease of native coronary artery without angina pectoris: Secondary | ICD-10-CM | POA: Diagnosis not present

## 2015-07-21 DIAGNOSIS — L97321 Non-pressure chronic ulcer of left ankle limited to breakdown of skin: Secondary | ICD-10-CM | POA: Diagnosis not present

## 2015-07-21 DIAGNOSIS — I509 Heart failure, unspecified: Secondary | ICD-10-CM | POA: Diagnosis not present

## 2015-07-24 DIAGNOSIS — I4891 Unspecified atrial fibrillation: Secondary | ICD-10-CM | POA: Diagnosis not present

## 2015-07-24 DIAGNOSIS — L97321 Non-pressure chronic ulcer of left ankle limited to breakdown of skin: Secondary | ICD-10-CM | POA: Diagnosis not present

## 2015-07-24 DIAGNOSIS — I509 Heart failure, unspecified: Secondary | ICD-10-CM | POA: Diagnosis not present

## 2015-07-24 DIAGNOSIS — I110 Hypertensive heart disease with heart failure: Secondary | ICD-10-CM | POA: Diagnosis not present

## 2015-07-24 DIAGNOSIS — I2510 Atherosclerotic heart disease of native coronary artery without angina pectoris: Secondary | ICD-10-CM | POA: Diagnosis not present

## 2015-07-24 DIAGNOSIS — R2681 Unsteadiness on feet: Secondary | ICD-10-CM | POA: Diagnosis not present

## 2015-07-27 ENCOUNTER — Ambulatory Visit: Payer: Self-pay | Admitting: *Deleted

## 2015-07-27 DIAGNOSIS — I509 Heart failure, unspecified: Secondary | ICD-10-CM | POA: Diagnosis not present

## 2015-07-27 DIAGNOSIS — I2510 Atherosclerotic heart disease of native coronary artery without angina pectoris: Secondary | ICD-10-CM | POA: Diagnosis not present

## 2015-07-27 DIAGNOSIS — R2681 Unsteadiness on feet: Secondary | ICD-10-CM | POA: Diagnosis not present

## 2015-07-27 DIAGNOSIS — L97321 Non-pressure chronic ulcer of left ankle limited to breakdown of skin: Secondary | ICD-10-CM | POA: Diagnosis not present

## 2015-07-27 DIAGNOSIS — I110 Hypertensive heart disease with heart failure: Secondary | ICD-10-CM | POA: Diagnosis not present

## 2015-07-27 DIAGNOSIS — I4891 Unspecified atrial fibrillation: Secondary | ICD-10-CM | POA: Diagnosis not present

## 2015-07-31 DIAGNOSIS — L97321 Non-pressure chronic ulcer of left ankle limited to breakdown of skin: Secondary | ICD-10-CM | POA: Diagnosis not present

## 2015-07-31 DIAGNOSIS — I110 Hypertensive heart disease with heart failure: Secondary | ICD-10-CM | POA: Diagnosis not present

## 2015-07-31 DIAGNOSIS — I2510 Atherosclerotic heart disease of native coronary artery without angina pectoris: Secondary | ICD-10-CM | POA: Diagnosis not present

## 2015-07-31 DIAGNOSIS — I4891 Unspecified atrial fibrillation: Secondary | ICD-10-CM | POA: Diagnosis not present

## 2015-07-31 DIAGNOSIS — I509 Heart failure, unspecified: Secondary | ICD-10-CM | POA: Diagnosis not present

## 2015-07-31 DIAGNOSIS — R2681 Unsteadiness on feet: Secondary | ICD-10-CM | POA: Diagnosis not present

## 2015-08-02 ENCOUNTER — Other Ambulatory Visit: Payer: Self-pay | Admitting: *Deleted

## 2015-08-02 DIAGNOSIS — I2510 Atherosclerotic heart disease of native coronary artery without angina pectoris: Secondary | ICD-10-CM | POA: Diagnosis not present

## 2015-08-02 DIAGNOSIS — S81802S Unspecified open wound, left lower leg, sequela: Secondary | ICD-10-CM | POA: Diagnosis not present

## 2015-08-02 NOTE — Patient Outreach (Signed)
[facility]) Care Management   08/02/2015  Gavin [name] [birth year] [id number]  Gavin [name] is an 81 y.o. male  Subjective:  "I am doing some better'.  Objective:   Review of Systems  Constitutional: Negative.   HENT: Negative.   Eyes: Negative.   Respiratory: Negative.   Cardiovascular: Positive for leg swelling. Negative for chest pain and palpitations.  Gastrointestinal: Negative.   Genitourinary: Positive for urgency.  Musculoskeletal: Positive for joint pain.       Right shoulder  Skin: Negative.   Neurological: Negative.   Psychiatric/Behavioral: Negative for depression. The patient has insomnia.     Physical Exam  Constitutional: He is oriented to person, place, and time. He appears well-developed and well-nourished.  Cardiovascular: Normal rate and normal heart sounds.   Respiratory: Effort normal and breath sounds normal. No respiratory distress.  GI: Soft.  Musculoskeletal: He exhibits edema.  Lower extremity edema, left leg greater than right. Dressing to left lower leg wound, redness around area  Neurological: He is alert and oriented to person, place, and time.  Skin: Skin is warm and dry.  Psychiatric: He has a normal mood and affect. His behavior is normal. Judgment and thought content normal.   BP 130/70 mmHg  Pulse 79  Resp 18  SpO2 96% Current Medications:   Current Outpatient Prescriptions  Medication Sig Dispense Refill  . acetaminophen (TYLENOL) 500 MG tablet Take 1,000 mg by mouth every 6 (six) hours as needed for moderate pain.    Marland Kitchen albuterol (PROVENTIL HFA;VENTOLIN HFA) 108 (90 BASE) MCG/ACT inhaler Inhale into the lungs every 6 (six) hours as needed for wheezing or shortness of breath. ProAir    . amLODipine (NORVASC) 10 MG tablet Take 10 mg by mouth daily.    Marland Kitchen aspirin EC 81 MG tablet Take 81 mg by mouth at bedtime.     Marland Kitchen atorvastatin (LIPITOR) 40 MG tablet Take 1 tablet (40 mg total) by mouth at bedtime. 31 tablet 11  .  Cholecalciferol (VITAMIN D3) 2000 UNITS TABS Take 1 tablet by mouth.    . clopidogrel (PLAVIX) 75 MG tablet Take 1 tablet (75 mg total) by mouth daily. 30 tablet 11  . Cyanocobalamin (VITAMIN B 12 PO) Take 1 tablet by mouth daily.    . DULoxetine (CYMBALTA) 60 MG capsule Take 60 mg by mouth daily.  0  . fexofenadine (ALLEGRA) 180 MG tablet Take 180 mg by mouth daily.    . furosemide (LASIX) 40 MG tablet Take 0.5 tablets (20 mg total) by mouth daily. 30 tablet 0  . isosorbide mononitrate (IMDUR) 30 MG 24 hr tablet Take 30 mg by mouth 2 (two) times daily.   0  . Ketotifen Fumarate (ALAWAY OP) Place 1 drop into both eyes 2 (two) times daily as needed (dry eyes).    . Memantine HCl-Donepezil HCl (NAMZARIC) 28-10 MG CP24 Take 1 mg by mouth.    . Menthol, Topical Analgesic, (BIOFREEZE EX) Apply 1 application topically daily as needed (pain).    Marland Kitchen metolazone (ZAROXOLYN) 2.5 MG tablet Take 2.5 mg by mouth every other day as needed (for fluid). Every other day    . metoprolol succinate (TOPROL-XL) 50 MG 24 hr tablet Take 25-50 mg by mouth daily. Take 25 mg in the morning and 50 mg in the evening.    . mirtazapine (REMERON) 15 MG tablet Take 15 mg by mouth at bedtime.    . Multiple Vitamin (MULTIVITAMIN WITH MINERALS) TABS tablet Take 1 tablet  by mouth daily.    . nitroGLYCERIN (NITROSTAT) 0.4 MG SL tablet Place 0.4 mg under the tongue every 5 (five) minutes as needed for chest pain.    Marland Kitchen oxyCODONE-acetaminophen (PERCOCET/ROXICET) 5-325 MG tablet Take 1 tablet by mouth every 6 (six) hours as needed for severe pain.    . pantoprazole (PROTONIX) 40 MG tablet take 1 tablet by mouth once daily 30 tablet 6  . potassium chloride SA (K-DUR,KLOR-CON) 20 MEQ tablet Take 1 tablet (20 mEq total) by mouth daily. 30 tablet 11  . QUEtiapine (SEROQUEL) 50 MG tablet Take 50 mg by mouth daily before supper.   1  . RANEXA 1000 MG SR tablet Take 1,000 mg by mouth 2 (two) times daily.  0  . saccharomyces boulardii (FLORASTOR)  250 MG capsule Take 250 mg by mouth 2 (two) times daily.    . Ampicillin-Sulbactam (UNASYN) 3 (2-1) G SOLR injection Inject 3 g into the vein every 8 (eight) hours.    . feeding supplement, ENSURE ENLIVE, (ENSURE ENLIVE) LIQD Take 237 mLs by mouth 2 (two) times daily between meals. (Patient not taking: Reported on 08/02/2015) 237 mL 12  . SANTYL ointment Apply 1 application topically daily.  0   No current facility-administered medications for this visit.    Functional Status:   In your present state of health, do you have any difficulty performing the following activities: 08/02/2015 05/22/2015  Hearing? N -  Vision? N -  Difficulty concentrating or making decisions? Y -  Walking or climbing stairs? Y -  Dressing or bathing? N -  Doing errands, shopping? N Y  Conservation officer, nature and eating ? N -  Using the Toilet? N -  In the past six months, have you accidently leaked urine? Y -  Do you have problems with loss of bowel control? N -  Managing your Medications? N -  Managing your Finances? N -  Housekeeping or managing your Housekeeping? N -    Fall/Depression Screening:    PHQ 2/9 Scores 08/02/2015 03/28/2015 02/03/2015  PHQ - 2 Score 0 0 0    Assessment:   Gavin [name] is still followed by [facility] RN for wound care, on Monday and Thursday, home health Physical therapy has been completed. Chronic Conditions: Falls : Denies having a fall since being discharged from skilled facility. He continues to use his rolling walker at all times, and is able to perform ADL's with little assistance.  Hypertension: Gavin [name] checks patient blood pressure twice weekly,and checked twice weekly by [facility], blood pressure has been controlled with readings mostly 130-140's/70. He denies chest pain or shortness of breath. Gavin [name] weighs daily, weight ranges 191 to 195, patient uses [place] as needed Monday,Wednesday, Friday as needed for weight gain. Left leg wound: Dressing in place, Gavin [name] states  that the  wound is healing slowly , per report of home health RN. Surgeon visited patient at [facility] to see wound in October, she states that she will try and get an appointment for him to evaluate wound in the next month,she states the home health RN communicates progress with MD. Gavin [name] reports that his appetite has improved in the last couple of weeks. Gavin [name] has an appointment with Dr.Scott on today. It is Gavin [name]'s goal to be able to return to a phase of Cardiac Rehab and they were asking questions today regarding being able to have home health and attend cardiac rehab. I placed a called to Cardiac Rehab, [place] where he has attended in the  past and was able to speak with Gavin [name] regarding patient question , she explained that the phase 3, program is an out of pocket 30 day program with cost of $121, then he will be re- evaluated to see if he is safe to exercise in the Wellness program, he will need a MD referral for the program. Gavin [name] states that she will discuss this with Dr.Scott on today. Plan:  Encouraged patient and his wife to notify, RN, Home Health nurse or  MD of concerns sooner Patient will continue to do home exercise taught by physical therapy, and keep legs elevated as much as possible during the day. Gavin [name] will write down questions to ask MD in [facility] book Scheduled home visit for December 7.    [facility] CM Care Plan Problem One        Most Recent Value   Care Plan Problem One  [place] followed by Skilled Rehab Admission   Role Documenting the Problem One  Care Management [place] for Problem One  Active   THN Long Term Goal (31-90 days)  Patient will not experience a hospital admission in the next 31 days   THN Long Term Goal Start Date  07/04/15   Interventions for Problem One Long Term Goal  Discussed importance of  timely follow up  with PCP after discharged,  and to call MD for concerns   THN CM Short Term Goal #1  (0-30 days)  Patient will not experience a hospital admission in the next 30 days   THN CM Short Term Goal #1 Start Date  07/04/15   [facility] CM Short Term Goal #1 Met Date  08/02/15   THN CM Short Term Goal #2 (0-30 days)  Patient to take steps toward enrolling into appropriate phase of cardiac rehab with MD approval   THN CM Short Term Goal #2 Start Date  08/02/15   Interventions for Short Term Goal #2  Placed call to cardiac rehab in [place] to discuss requirements, Mrs.Caywood to dicuss with MD at office visit    [place] Problem Two        Most Recent Value   Care Plan Problem Two  Self Care Management of Hypertension   Role Documenting the Problem Two  Care Management Coordinator   Care Plan for Problem Two  Active   THN Long Term Goal (31-90) days  Patient and wife will verbalize understanding Hypertension self care monitoring importance   THN Long Term Goal Start Date  02/13/15   THN Long Term Goal Met Date  03/28/15   THN CM Short Term Goal #1 (0-30 days)  Patient will have his blood  pressure checked and recorded by his wife at least 3 days per week within the next 30 days    THN CM Short Term Goal #1 Start Date  02/13/15   [facility] CM Short Term Goal #1 Met Date   03/28/15   THN CM Short Term Goal #2 (0-30 days)  Patient will report weighing self daily and recording within the next 30 days.   THN CM Short Term Goal #2 Start Date  02/13/15   [facility] CM Short Term Goal #2 Met Date  03/28/15    [facility] CM Care Plan Problem Three        Most Recent Value   Care Plan Problem Three  Wound Left Leg   Role Documenting the Problem Three  Care Management Coordinator   Care Plan for Problem  Three  Active   THN Long Term Goal (31-90) days  Patient will experience wound healing in the next 31 days   THN Long Term Goal Start Date  07/13/15 [goal date restarted]   Interventions for Problem Three Long Term Goal  Discussed how nutritious drinks such as boost add protein to help with healing, suggested  trying small meals througout the day.   THN CM Short Term Goal #1 (0-30 days)  Patient will verbalize and demostrate importance of MD follow up on  Wounds   THN CM Short Term Goal #1 Start Date  03/28/15   THN CM Short Term Goal #1 Met Date  07/13/15   THN CM Short Term Goal #2 (0-30 days)  Patient will keep legs elevated during the day while sitting in his chair as much as tolerated   THN CM Short Term Goal #2 Start Date  07/13/15 [goal date restarted]   Interventions for Short Term Goal #2  Reviewed with patient options in his home to use to elevated his leg   THN CM Short Term Goal #3 (0-30 days)  Patient will state improved pain control   THN  CM Short Term Goal #3 Start Date  05/11/15 [goal date restarted]   [facility] CM Short Term Goal #3 Met Date  07/13/15    Joylene Draft, RN, [place] Management [phone]- Mobile [phone]- [place] Office

## 2015-08-03 DIAGNOSIS — I509 Heart failure, unspecified: Secondary | ICD-10-CM | POA: Diagnosis not present

## 2015-08-03 DIAGNOSIS — I2510 Atherosclerotic heart disease of native coronary artery without angina pectoris: Secondary | ICD-10-CM | POA: Diagnosis not present

## 2015-08-03 DIAGNOSIS — I110 Hypertensive heart disease with heart failure: Secondary | ICD-10-CM | POA: Diagnosis not present

## 2015-08-03 DIAGNOSIS — R2681 Unsteadiness on feet: Secondary | ICD-10-CM | POA: Diagnosis not present

## 2015-08-03 DIAGNOSIS — L97321 Non-pressure chronic ulcer of left ankle limited to breakdown of skin: Secondary | ICD-10-CM | POA: Diagnosis not present

## 2015-08-03 DIAGNOSIS — I4891 Unspecified atrial fibrillation: Secondary | ICD-10-CM | POA: Diagnosis not present

## 2015-08-07 DIAGNOSIS — L97321 Non-pressure chronic ulcer of left ankle limited to breakdown of skin: Secondary | ICD-10-CM | POA: Diagnosis not present

## 2015-08-07 DIAGNOSIS — I2510 Atherosclerotic heart disease of native coronary artery without angina pectoris: Secondary | ICD-10-CM | POA: Diagnosis not present

## 2015-08-07 DIAGNOSIS — R2681 Unsteadiness on feet: Secondary | ICD-10-CM | POA: Diagnosis not present

## 2015-08-07 DIAGNOSIS — I110 Hypertensive heart disease with heart failure: Secondary | ICD-10-CM | POA: Diagnosis not present

## 2015-08-07 DIAGNOSIS — I509 Heart failure, unspecified: Secondary | ICD-10-CM | POA: Diagnosis not present

## 2015-08-07 DIAGNOSIS — I4891 Unspecified atrial fibrillation: Secondary | ICD-10-CM | POA: Diagnosis not present

## 2015-08-08 DIAGNOSIS — R2681 Unsteadiness on feet: Secondary | ICD-10-CM | POA: Diagnosis not present

## 2015-08-08 DIAGNOSIS — L97321 Non-pressure chronic ulcer of left ankle limited to breakdown of skin: Secondary | ICD-10-CM | POA: Diagnosis not present

## 2015-08-08 DIAGNOSIS — I4891 Unspecified atrial fibrillation: Secondary | ICD-10-CM | POA: Diagnosis not present

## 2015-08-08 DIAGNOSIS — I110 Hypertensive heart disease with heart failure: Secondary | ICD-10-CM | POA: Diagnosis not present

## 2015-08-08 DIAGNOSIS — I2510 Atherosclerotic heart disease of native coronary artery without angina pectoris: Secondary | ICD-10-CM | POA: Diagnosis not present

## 2015-08-08 DIAGNOSIS — I509 Heart failure, unspecified: Secondary | ICD-10-CM | POA: Diagnosis not present

## 2015-08-09 DIAGNOSIS — I771 Stricture of artery: Secondary | ICD-10-CM | POA: Diagnosis not present

## 2015-08-09 DIAGNOSIS — L98499 Non-pressure chronic ulcer of skin of other sites with unspecified severity: Secondary | ICD-10-CM | POA: Diagnosis not present

## 2015-08-10 DIAGNOSIS — I110 Hypertensive heart disease with heart failure: Secondary | ICD-10-CM | POA: Diagnosis not present

## 2015-08-10 DIAGNOSIS — I2510 Atherosclerotic heart disease of native coronary artery without angina pectoris: Secondary | ICD-10-CM | POA: Diagnosis not present

## 2015-08-10 DIAGNOSIS — I509 Heart failure, unspecified: Secondary | ICD-10-CM | POA: Diagnosis not present

## 2015-08-10 DIAGNOSIS — L97321 Non-pressure chronic ulcer of left ankle limited to breakdown of skin: Secondary | ICD-10-CM | POA: Diagnosis not present

## 2015-08-10 DIAGNOSIS — R2681 Unsteadiness on feet: Secondary | ICD-10-CM | POA: Diagnosis not present

## 2015-08-10 DIAGNOSIS — I4891 Unspecified atrial fibrillation: Secondary | ICD-10-CM | POA: Diagnosis not present

## 2015-08-14 DIAGNOSIS — R2681 Unsteadiness on feet: Secondary | ICD-10-CM | POA: Diagnosis not present

## 2015-08-14 DIAGNOSIS — L97321 Non-pressure chronic ulcer of left ankle limited to breakdown of skin: Secondary | ICD-10-CM | POA: Diagnosis not present

## 2015-08-14 DIAGNOSIS — I4891 Unspecified atrial fibrillation: Secondary | ICD-10-CM | POA: Diagnosis not present

## 2015-08-14 DIAGNOSIS — I2510 Atherosclerotic heart disease of native coronary artery without angina pectoris: Secondary | ICD-10-CM | POA: Diagnosis not present

## 2015-08-14 DIAGNOSIS — I110 Hypertensive heart disease with heart failure: Secondary | ICD-10-CM | POA: Diagnosis not present

## 2015-08-14 DIAGNOSIS — I509 Heart failure, unspecified: Secondary | ICD-10-CM | POA: Diagnosis not present

## 2015-08-15 DIAGNOSIS — B351 Tinea unguium: Secondary | ICD-10-CM | POA: Diagnosis not present

## 2015-08-15 DIAGNOSIS — I70209 Unspecified atherosclerosis of native arteries of extremities, unspecified extremity: Secondary | ICD-10-CM | POA: Diagnosis not present

## 2015-08-16 DIAGNOSIS — I2510 Atherosclerotic heart disease of native coronary artery without angina pectoris: Secondary | ICD-10-CM | POA: Diagnosis not present

## 2015-08-16 DIAGNOSIS — I110 Hypertensive heart disease with heart failure: Secondary | ICD-10-CM | POA: Diagnosis not present

## 2015-08-16 DIAGNOSIS — I4891 Unspecified atrial fibrillation: Secondary | ICD-10-CM | POA: Diagnosis not present

## 2015-08-16 DIAGNOSIS — I509 Heart failure, unspecified: Secondary | ICD-10-CM | POA: Diagnosis not present

## 2015-08-16 DIAGNOSIS — R2681 Unsteadiness on feet: Secondary | ICD-10-CM | POA: Diagnosis not present

## 2015-08-16 DIAGNOSIS — L0390 Cellulitis, unspecified: Secondary | ICD-10-CM | POA: Diagnosis not present

## 2015-08-16 DIAGNOSIS — M79605 Pain in left leg: Secondary | ICD-10-CM | POA: Diagnosis not present

## 2015-08-16 DIAGNOSIS — L97321 Non-pressure chronic ulcer of left ankle limited to breakdown of skin: Secondary | ICD-10-CM | POA: Diagnosis not present

## 2015-08-21 DIAGNOSIS — L97321 Non-pressure chronic ulcer of left ankle limited to breakdown of skin: Secondary | ICD-10-CM | POA: Diagnosis not present

## 2015-08-21 DIAGNOSIS — I110 Hypertensive heart disease with heart failure: Secondary | ICD-10-CM | POA: Diagnosis not present

## 2015-08-21 DIAGNOSIS — I2510 Atherosclerotic heart disease of native coronary artery without angina pectoris: Secondary | ICD-10-CM | POA: Diagnosis not present

## 2015-08-21 DIAGNOSIS — I4891 Unspecified atrial fibrillation: Secondary | ICD-10-CM | POA: Diagnosis not present

## 2015-08-21 DIAGNOSIS — I509 Heart failure, unspecified: Secondary | ICD-10-CM | POA: Diagnosis not present

## 2015-08-21 DIAGNOSIS — R2681 Unsteadiness on feet: Secondary | ICD-10-CM | POA: Diagnosis not present

## 2015-08-23 DIAGNOSIS — I771 Stricture of artery: Secondary | ICD-10-CM | POA: Diagnosis not present

## 2015-08-23 DIAGNOSIS — L98499 Non-pressure chronic ulcer of skin of other sites with unspecified severity: Secondary | ICD-10-CM | POA: Diagnosis not present

## 2015-08-24 DIAGNOSIS — I2510 Atherosclerotic heart disease of native coronary artery without angina pectoris: Secondary | ICD-10-CM | POA: Diagnosis not present

## 2015-08-24 DIAGNOSIS — R2681 Unsteadiness on feet: Secondary | ICD-10-CM | POA: Diagnosis not present

## 2015-08-24 DIAGNOSIS — I509 Heart failure, unspecified: Secondary | ICD-10-CM | POA: Diagnosis not present

## 2015-08-24 DIAGNOSIS — I4891 Unspecified atrial fibrillation: Secondary | ICD-10-CM | POA: Diagnosis not present

## 2015-08-24 DIAGNOSIS — I110 Hypertensive heart disease with heart failure: Secondary | ICD-10-CM | POA: Diagnosis not present

## 2015-08-24 DIAGNOSIS — L97321 Non-pressure chronic ulcer of left ankle limited to breakdown of skin: Secondary | ICD-10-CM | POA: Diagnosis not present

## 2015-08-29 DIAGNOSIS — I110 Hypertensive heart disease with heart failure: Secondary | ICD-10-CM | POA: Diagnosis not present

## 2015-08-29 DIAGNOSIS — L97321 Non-pressure chronic ulcer of left ankle limited to breakdown of skin: Secondary | ICD-10-CM | POA: Diagnosis not present

## 2015-08-29 DIAGNOSIS — I509 Heart failure, unspecified: Secondary | ICD-10-CM | POA: Diagnosis not present

## 2015-08-29 DIAGNOSIS — I2510 Atherosclerotic heart disease of native coronary artery without angina pectoris: Secondary | ICD-10-CM | POA: Diagnosis not present

## 2015-08-29 DIAGNOSIS — R2681 Unsteadiness on feet: Secondary | ICD-10-CM | POA: Diagnosis not present

## 2015-08-29 DIAGNOSIS — I4891 Unspecified atrial fibrillation: Secondary | ICD-10-CM | POA: Diagnosis not present

## 2015-08-30 ENCOUNTER — Other Ambulatory Visit: Payer: Self-pay | Admitting: *Deleted

## 2015-08-30 DIAGNOSIS — N401 Enlarged prostate with lower urinary tract symptoms: Secondary | ICD-10-CM | POA: Diagnosis not present

## 2015-08-30 DIAGNOSIS — N3281 Overactive bladder: Secondary | ICD-10-CM | POA: Diagnosis not present

## 2015-08-30 NOTE — Patient Outreach (Signed)
[facility]) Care Management   08/30/2015  Gavin [name] 1928-11-24 [id number]  Gavin [name] is an 81 y.o. male  Subjective: " I am moving a little slower this morning'  Objective:   Review of Systems  Constitutional: Negative.   HENT: Negative.   Eyes: Negative.   Respiratory: Negative.   Cardiovascular: Positive for leg swelling. Negative for chest pain.       Bilateral lower leg edema, left leg greater than right  Gastrointestinal: Negative.   Musculoskeletal: Positive for falls.  Skin:       Left leg wound, with dressing in place, noted redness around dressing .  Neurological: Negative.   Psychiatric/Behavioral: Negative.    BP 140/70 mmHg  Pulse 62  Resp 18  SpO2 95% Physical Exam  Constitutional: He is oriented to person, place, and time. He appears well-developed and well-nourished.  Cardiovascular: Normal rate and normal heart sounds.  Exam reveals decreased pulses.   Diminished lower extremity pulses  Respiratory: Breath sounds normal.  Musculoskeletal:  "Arthritis right shoulder"  Neurological: He is oriented to person, place, and time.  Skin: Skin is warm and dry.  Psychiatric: He has a normal mood and affect. His behavior is normal. Judgment and thought content normal.    Current Medications:   Current Outpatient Prescriptions  Medication Sig Dispense Refill  . acetaminophen (TYLENOL) 500 MG tablet Take 1,000 mg by mouth every 6 (six) hours as needed for moderate pain.    Marland Kitchen albuterol (PROVENTIL HFA;VENTOLIN HFA) 108 (90 BASE) MCG/ACT inhaler Inhale into the lungs every 6 (six) hours as needed for wheezing or shortness of breath. ProAir    . amLODipine (NORVASC) 10 MG tablet Take 10 mg by mouth daily.    . Ampicillin-Sulbactam (UNASYN) 3 (2-1) G SOLR injection Inject 3 g into the vein every 8 (eight) hours.    Marland Kitchen aspirin EC 81 MG tablet Take 81 mg by mouth at bedtime.     Marland Kitchen atorvastatin (LIPITOR) 40 MG tablet Take 1 tablet (40 mg total) by  mouth at bedtime. 31 tablet 11  . Cholecalciferol (VITAMIN D3) 2000 UNITS TABS Take 1 tablet by mouth.    . clopidogrel (PLAVIX) 75 MG tablet Take 1 tablet (75 mg total) by mouth daily. 30 tablet 11  . Cyanocobalamin (VITAMIN B 12 PO) Take 1 tablet by mouth daily.    . DULoxetine (CYMBALTA) 60 MG capsule Take 60 mg by mouth daily.  0  . feeding supplement, ENSURE ENLIVE, (ENSURE ENLIVE) LIQD Take 237 mLs by mouth 2 (two) times daily between meals. (Patient not taking: Reported on 08/02/2015) 237 mL 12  . fexofenadine (ALLEGRA) 180 MG tablet Take 180 mg by mouth daily.    . furosemide (LASIX) 40 MG tablet Take 0.5 tablets (20 mg total) by mouth daily. 30 tablet 0  . isosorbide mononitrate (IMDUR) 30 MG 24 hr tablet Take 30 mg by mouth 2 (two) times daily.   0  . Ketotifen Fumarate (ALAWAY OP) Place 1 drop into both eyes 2 (two) times daily as needed (dry eyes).    . Memantine HCl-Donepezil HCl (NAMZARIC) 28-10 MG CP24 Take 1 mg by mouth.    . Menthol, Topical Analgesic, (BIOFREEZE EX) Apply 1 application topically daily as needed (pain).    Marland Kitchen metolazone (ZAROXOLYN) 2.5 MG tablet Take 2.5 mg by mouth every other day as needed (for fluid). Every other day    . metoprolol succinate (TOPROL-XL) 50 MG 24 hr tablet Take 25-50 mg by  mouth daily. Take 25 mg in the morning and 50 mg in the evening.    . mirtazapine (REMERON) 15 MG tablet Take 15 mg by mouth at bedtime.    . Multiple Vitamin (MULTIVITAMIN WITH MINERALS) TABS tablet Take 1 tablet by mouth daily.    . nitroGLYCERIN (NITROSTAT) 0.4 MG SL tablet Place 0.4 mg under the tongue every 5 (five) minutes as needed for chest pain.    Marland Kitchen oxyCODONE-acetaminophen (PERCOCET/ROXICET) 5-325 MG tablet Take 1 tablet by mouth every 6 (six) hours as needed for severe pain.    . pantoprazole (PROTONIX) 40 MG tablet take 1 tablet by mouth once daily 30 tablet 6  . potassium chloride SA (K-DUR,KLOR-CON) 20 MEQ tablet Take 1 tablet (20 mEq total) by mouth daily. 30  tablet 11  . QUEtiapine (SEROQUEL) 50 MG tablet Take 50 mg by mouth daily before supper.   1  . RANEXA 1000 MG SR tablet Take 1,000 mg by mouth 2 (two) times daily.  0  . saccharomyces boulardii (FLORASTOR) 250 MG capsule Take 250 mg by mouth 2 (two) times daily.    Marland Kitchen SANTYL ointment Apply 1 application topically daily.  0   No current facility-administered medications for this visit.    Functional Status:   In your present state of health, do you have any difficulty performing the following activities: 08/02/2015 05/22/2015  Hearing? N -  Vision? N -  Difficulty concentrating or making decisions? Y -  Walking or climbing stairs? Y -  Dressing or bathing? N -  Doing errands, shopping? N Y  Conservation officer, nature and eating ? N -  Using the Toilet? N -  In the past six months, have you accidently leaked urine? Y -  Do you have problems with loss of bowel control? N -  Managing your Medications? N -  Managing your Finances? N -  Housekeeping or managing your Housekeeping? N -    Fall/Depression Screening:    PHQ 2/9 Scores 08/02/2015 03/28/2015 02/03/2015  PHQ - 2 Score 0 0 0    Assessment:   Patient continues to be followed by [facility] RN for wound care twice weekly.  Hypertension: Mrs.Borah continues to monitor and record patients blood pressure at least 2 times a week, reports readings 130-140/70-80, noted medication adjustment at recent MD appointment.Patient continues to weigh daily, and patient and wife able to state that they will notify me or MD for weight gains greater than 1 pound in a day and 5 pounds in a week and other symptoms.   Falls: Patient reports a fall recently at home, states he lost his balance he was using her walker at the time, denies injury from the fall, states he was seen at urgent care after the fall.   Wound Healing; Patient with a recent visit to Dr.Morehead,for follow up regarding left leg wound , per Mrs.Molstad she reports that the wound is  continuing to show healing. Mrs.Conigliaro changes dressing at least 5 days a week. Reinforced importance of keeping left leg elevated as much as possible.Patient reports improved appetite and we discussed the importance in proper nutrition for healing.    Plan:  Patient will keep leg elevated as much as possible RN care manager will plan home visit in January 2017.  [facility] CM Care Plan Problem One        Most Recent Value   Care Plan Problem One  [place] followed by Skilled Rehab Admission   Role Documenting the Problem  One  Care Management Coordinator   Care Plan for Problem One  Active   THN Long Term Goal (31-90 days)  Patient will not experience a hospital admission in the next 31 days   THN Long Term Goal Start Date  07/04/15   THN Long Term Goal Met Date  08/02/15   THN CM Short Term Goal #1 (0-30 days)  Patient will not experience a hospital admission in the next 30 days   THN CM Short Term Goal #1 Start Date  07/04/15   [facility] CM Short Term Goal #1 Met Date  08/02/15   THN CM Short Term Goal #2 (0-30 days)  Patient to take steps toward enrolling into appropriate phase of cardiac rehab with MD approval   THN CM Short Term Goal #2 Start Date  08/02/15   [facility] CM Short Term Goal #2 Met Date  08/30/15   Interventions for Short Term Goal #2  Patient unable to begin any cardiac rehab as long as he recieves home health    [facility] CM Care Plan Problem Two        Most Recent Value   Care Plan Problem Two  Self Care Management of Hypertension   Role Documenting the Problem Two  Care Management Coordinator   Care Plan for Problem Two  Active   THN Long Term Goal (31-90) days  Patient and wife will verbalize understanding Hypertension self care monitoring importance   THN Long Term Goal Start Date  02/13/15   THN Long Term Goal Met Date  03/28/15   THN CM Short Term Goal #1 (0-30 days)  Patient will have his blood  pressure checked and recorded by his wife at least 3 days per week  within the next 30 days    THN CM Short Term Goal #1 Start Date  02/13/15   [facility] CM Short Term Goal #1 Met Date   03/28/15   THN CM Short Term Goal #2 (0-30 days)  Patient will report weighing self daily and recording within the next 30 days.   THN CM Short Term Goal #2 Start Date  02/13/15   THN CM Short Term Goal #2 Met Date  03/28/15   THN CM Short Term Goal #3 (0-30 days)  Patient will report particiapting in a daily exercise routine in next 30 days   THN CM Short Term Goal #3 Start Date  08/30/15   Interventions for Short Term Goal #3  Discussed importance of exercise and activty to improve balance, strength and improve circulation, reviewed chair exercises, walking routine in home space     [facility] CM Care Plan Problem Three        Most Recent Value   Care Plan Problem Three  Wound Left Leg   Role Documenting the Problem Three  Care Management Coordinator   Care Plan for Problem Three  Active   THN Long Term Goal (31-90) days  Patient will experience wound healing in the next 31 days   THN Long Term Goal Start Date  07/13/15 [goal date restarted]   [facility] Long Term Goal Met Date  08/10/15   THN CM Short Term Goal #1 (0-30 days)  Patient will verbalize and demostrate importance of MD follow up on  Wounds   THN CM Short Term Goal #1 Start Date  03/28/15   [facility] CM Short Term Goal #1 Met Date  07/13/15   THN CM Short Term Goal #2 (0-30 days)  Patient will keep legs elevated during the  day while sitting in his chair as much as tolerated   THN CM Short Term Goal #2 Start Date  07/13/15 [goal date restarted]   Interventions for Short Term Goal #2  continued to reinforce importance of elevation of leg to circulation to improve healing,demonstrated best height and use of pillow   THN CM Short Term Goal #3 (0-30 days)  Patient will state improved pain control   THN  CM Short Term Goal #3 Start Date  05/11/15 [goal date restarted]   [facility] CM Short Term Goal #3 Met Date  07/13/15     Joylene Draft, RN,  [place] Management [phone]- Mobile [phone]- [place] Office

## 2015-08-31 DIAGNOSIS — I87332 Chronic venous hypertension (idiopathic) with ulcer and inflammation of left lower extremity: Secondary | ICD-10-CM | POA: Diagnosis not present

## 2015-08-31 DIAGNOSIS — F0150 Vascular dementia without behavioral disturbance: Secondary | ICD-10-CM | POA: Diagnosis not present

## 2015-08-31 DIAGNOSIS — F29 Unspecified psychosis not due to a substance or known physiological condition: Secondary | ICD-10-CM | POA: Diagnosis not present

## 2015-08-31 DIAGNOSIS — I679 Cerebrovascular disease, unspecified: Secondary | ICD-10-CM | POA: Diagnosis not present

## 2015-08-31 DIAGNOSIS — Z79891 Long term (current) use of opiate analgesic: Secondary | ICD-10-CM | POA: Diagnosis not present

## 2015-08-31 DIAGNOSIS — I480 Paroxysmal atrial fibrillation: Secondary | ICD-10-CM | POA: Diagnosis not present

## 2015-08-31 DIAGNOSIS — I2510 Atherosclerotic heart disease of native coronary artery without angina pectoris: Secondary | ICD-10-CM | POA: Diagnosis not present

## 2015-08-31 DIAGNOSIS — Z7982 Long term (current) use of aspirin: Secondary | ICD-10-CM | POA: Diagnosis not present

## 2015-08-31 DIAGNOSIS — L97322 Non-pressure chronic ulcer of left ankle with fat layer exposed: Secondary | ICD-10-CM | POA: Diagnosis not present

## 2015-08-31 DIAGNOSIS — I70202 Unspecified atherosclerosis of native arteries of extremities, left leg: Secondary | ICD-10-CM | POA: Diagnosis not present

## 2015-08-31 DIAGNOSIS — F3289 Other specified depressive episodes: Secondary | ICD-10-CM | POA: Diagnosis not present

## 2015-08-31 DIAGNOSIS — I110 Hypertensive heart disease with heart failure: Secondary | ICD-10-CM | POA: Diagnosis not present

## 2015-08-31 DIAGNOSIS — I252 Old myocardial infarction: Secondary | ICD-10-CM | POA: Diagnosis not present

## 2015-08-31 DIAGNOSIS — Z9181 History of falling: Secondary | ICD-10-CM | POA: Diagnosis not present

## 2015-08-31 DIAGNOSIS — I509 Heart failure, unspecified: Secondary | ICD-10-CM | POA: Diagnosis not present

## 2015-09-01 DIAGNOSIS — I70202 Unspecified atherosclerosis of native arteries of extremities, left leg: Secondary | ICD-10-CM | POA: Diagnosis not present

## 2015-09-01 DIAGNOSIS — I110 Hypertensive heart disease with heart failure: Secondary | ICD-10-CM | POA: Diagnosis not present

## 2015-09-01 DIAGNOSIS — I2510 Atherosclerotic heart disease of native coronary artery without angina pectoris: Secondary | ICD-10-CM | POA: Diagnosis not present

## 2015-09-01 DIAGNOSIS — I87332 Chronic venous hypertension (idiopathic) with ulcer and inflammation of left lower extremity: Secondary | ICD-10-CM | POA: Diagnosis not present

## 2015-09-01 DIAGNOSIS — L97322 Non-pressure chronic ulcer of left ankle with fat layer exposed: Secondary | ICD-10-CM | POA: Diagnosis not present

## 2015-09-01 DIAGNOSIS — I509 Heart failure, unspecified: Secondary | ICD-10-CM | POA: Diagnosis not present

## 2015-09-04 DIAGNOSIS — I2510 Atherosclerotic heart disease of native coronary artery without angina pectoris: Secondary | ICD-10-CM | POA: Diagnosis not present

## 2015-09-04 DIAGNOSIS — L97322 Non-pressure chronic ulcer of left ankle with fat layer exposed: Secondary | ICD-10-CM | POA: Diagnosis not present

## 2015-09-04 DIAGNOSIS — I70202 Unspecified atherosclerosis of native arteries of extremities, left leg: Secondary | ICD-10-CM | POA: Diagnosis not present

## 2015-09-04 DIAGNOSIS — I509 Heart failure, unspecified: Secondary | ICD-10-CM | POA: Diagnosis not present

## 2015-09-04 DIAGNOSIS — I87332 Chronic venous hypertension (idiopathic) with ulcer and inflammation of left lower extremity: Secondary | ICD-10-CM | POA: Diagnosis not present

## 2015-09-04 DIAGNOSIS — I110 Hypertensive heart disease with heart failure: Secondary | ICD-10-CM | POA: Diagnosis not present

## 2015-09-06 DIAGNOSIS — R600 Localized edema: Secondary | ICD-10-CM | POA: Diagnosis not present

## 2015-09-06 DIAGNOSIS — M25519 Pain in unspecified shoulder: Secondary | ICD-10-CM | POA: Diagnosis not present

## 2015-09-06 DIAGNOSIS — R441 Visual hallucinations: Secondary | ICD-10-CM | POA: Diagnosis not present

## 2015-09-06 DIAGNOSIS — G8929 Other chronic pain: Secondary | ICD-10-CM | POA: Diagnosis not present

## 2015-09-07 DIAGNOSIS — L98499 Non-pressure chronic ulcer of skin of other sites with unspecified severity: Secondary | ICD-10-CM | POA: Diagnosis not present

## 2015-09-07 DIAGNOSIS — I771 Stricture of artery: Secondary | ICD-10-CM | POA: Diagnosis not present

## 2015-09-08 DIAGNOSIS — I70202 Unspecified atherosclerosis of native arteries of extremities, left leg: Secondary | ICD-10-CM | POA: Diagnosis not present

## 2015-09-08 DIAGNOSIS — I110 Hypertensive heart disease with heart failure: Secondary | ICD-10-CM | POA: Diagnosis not present

## 2015-09-08 DIAGNOSIS — I2510 Atherosclerotic heart disease of native coronary artery without angina pectoris: Secondary | ICD-10-CM | POA: Diagnosis not present

## 2015-09-08 DIAGNOSIS — L97322 Non-pressure chronic ulcer of left ankle with fat layer exposed: Secondary | ICD-10-CM | POA: Diagnosis not present

## 2015-09-08 DIAGNOSIS — I509 Heart failure, unspecified: Secondary | ICD-10-CM | POA: Diagnosis not present

## 2015-09-08 DIAGNOSIS — I87332 Chronic venous hypertension (idiopathic) with ulcer and inflammation of left lower extremity: Secondary | ICD-10-CM | POA: Diagnosis not present

## 2015-09-11 DIAGNOSIS — I70202 Unspecified atherosclerosis of native arteries of extremities, left leg: Secondary | ICD-10-CM | POA: Diagnosis not present

## 2015-09-11 DIAGNOSIS — I110 Hypertensive heart disease with heart failure: Secondary | ICD-10-CM | POA: Diagnosis not present

## 2015-09-11 DIAGNOSIS — I2510 Atherosclerotic heart disease of native coronary artery without angina pectoris: Secondary | ICD-10-CM | POA: Diagnosis not present

## 2015-09-11 DIAGNOSIS — I87332 Chronic venous hypertension (idiopathic) with ulcer and inflammation of left lower extremity: Secondary | ICD-10-CM | POA: Diagnosis not present

## 2015-09-11 DIAGNOSIS — L97322 Non-pressure chronic ulcer of left ankle with fat layer exposed: Secondary | ICD-10-CM | POA: Diagnosis not present

## 2015-09-11 DIAGNOSIS — I509 Heart failure, unspecified: Secondary | ICD-10-CM | POA: Diagnosis not present

## 2015-09-12 ENCOUNTER — Ambulatory Visit: Payer: Medicare Other | Admitting: Cardiovascular Disease

## 2015-09-12 ENCOUNTER — Other Ambulatory Visit: Payer: Self-pay | Admitting: Cardiovascular Disease

## 2015-09-12 DIAGNOSIS — I6523 Occlusion and stenosis of bilateral carotid arteries: Secondary | ICD-10-CM

## 2015-09-14 DIAGNOSIS — I87332 Chronic venous hypertension (idiopathic) with ulcer and inflammation of left lower extremity: Secondary | ICD-10-CM | POA: Diagnosis not present

## 2015-09-14 DIAGNOSIS — I2510 Atherosclerotic heart disease of native coronary artery without angina pectoris: Secondary | ICD-10-CM | POA: Diagnosis not present

## 2015-09-14 DIAGNOSIS — I110 Hypertensive heart disease with heart failure: Secondary | ICD-10-CM | POA: Diagnosis not present

## 2015-09-14 DIAGNOSIS — L97322 Non-pressure chronic ulcer of left ankle with fat layer exposed: Secondary | ICD-10-CM | POA: Diagnosis not present

## 2015-09-14 DIAGNOSIS — I509 Heart failure, unspecified: Secondary | ICD-10-CM | POA: Diagnosis not present

## 2015-09-14 DIAGNOSIS — I70202 Unspecified atherosclerosis of native arteries of extremities, left leg: Secondary | ICD-10-CM | POA: Diagnosis not present

## 2015-09-19 DIAGNOSIS — I2510 Atherosclerotic heart disease of native coronary artery without angina pectoris: Secondary | ICD-10-CM | POA: Diagnosis not present

## 2015-09-19 DIAGNOSIS — I110 Hypertensive heart disease with heart failure: Secondary | ICD-10-CM | POA: Diagnosis not present

## 2015-09-19 DIAGNOSIS — I70202 Unspecified atherosclerosis of native arteries of extremities, left leg: Secondary | ICD-10-CM | POA: Diagnosis not present

## 2015-09-19 DIAGNOSIS — I509 Heart failure, unspecified: Secondary | ICD-10-CM | POA: Diagnosis not present

## 2015-09-19 DIAGNOSIS — L97322 Non-pressure chronic ulcer of left ankle with fat layer exposed: Secondary | ICD-10-CM | POA: Diagnosis not present

## 2015-09-19 DIAGNOSIS — I87332 Chronic venous hypertension (idiopathic) with ulcer and inflammation of left lower extremity: Secondary | ICD-10-CM | POA: Diagnosis not present

## 2015-09-20 ENCOUNTER — Ambulatory Visit (HOSPITAL_COMMUNITY)
Admission: RE | Admit: 2015-09-20 | Discharge: 2015-09-20 | Disposition: A | Discharge status: Home / Self Care | Payer: Medicare Other | Source: Ambulatory Visit | Attending: Cardiovascular Disease | Admitting: Cardiovascular Disease

## 2015-09-20 DIAGNOSIS — E785 Hyperlipidemia, unspecified: Secondary | ICD-10-CM | POA: Insufficient documentation

## 2015-09-20 DIAGNOSIS — I10 Essential (primary) hypertension: Secondary | ICD-10-CM | POA: Diagnosis not present

## 2015-09-20 DIAGNOSIS — I70202 Unspecified atherosclerosis of native arteries of extremities, left leg: Secondary | ICD-10-CM | POA: Diagnosis not present

## 2015-09-20 DIAGNOSIS — I6523 Occlusion and stenosis of bilateral carotid arteries: Secondary | ICD-10-CM | POA: Insufficient documentation

## 2015-09-20 DIAGNOSIS — L97322 Non-pressure chronic ulcer of left ankle with fat layer exposed: Secondary | ICD-10-CM | POA: Diagnosis not present

## 2015-09-21 ENCOUNTER — Telehealth: Payer: Self-pay

## 2015-09-21 DIAGNOSIS — I10 Essential (primary) hypertension: Secondary | ICD-10-CM

## 2015-09-21 DIAGNOSIS — I509 Heart failure, unspecified: Secondary | ICD-10-CM | POA: Diagnosis not present

## 2015-09-21 DIAGNOSIS — I110 Hypertensive heart disease with heart failure: Secondary | ICD-10-CM | POA: Diagnosis not present

## 2015-09-21 DIAGNOSIS — I87332 Chronic venous hypertension (idiopathic) with ulcer and inflammation of left lower extremity: Secondary | ICD-10-CM | POA: Diagnosis not present

## 2015-09-21 DIAGNOSIS — I70202 Unspecified atherosclerosis of native arteries of extremities, left leg: Secondary | ICD-10-CM | POA: Diagnosis not present

## 2015-09-21 DIAGNOSIS — L97322 Non-pressure chronic ulcer of left ankle with fat layer exposed: Secondary | ICD-10-CM | POA: Diagnosis not present

## 2015-09-21 DIAGNOSIS — I2510 Atherosclerotic heart disease of native coronary artery without angina pectoris: Secondary | ICD-10-CM | POA: Diagnosis not present

## 2015-09-21 NOTE — Telephone Encounter (Signed)
-----   Message from Lorretta Harp, MD sent at 09/21/2015  4:36 PM EST ----- Slight increase RICA stenosis. Repeat 6 months

## 2015-09-26 DIAGNOSIS — I509 Heart failure, unspecified: Secondary | ICD-10-CM | POA: Diagnosis not present

## 2015-09-26 DIAGNOSIS — I110 Hypertensive heart disease with heart failure: Secondary | ICD-10-CM | POA: Diagnosis not present

## 2015-09-26 DIAGNOSIS — L97322 Non-pressure chronic ulcer of left ankle with fat layer exposed: Secondary | ICD-10-CM | POA: Diagnosis not present

## 2015-09-26 DIAGNOSIS — I87332 Chronic venous hypertension (idiopathic) with ulcer and inflammation of left lower extremity: Secondary | ICD-10-CM | POA: Diagnosis not present

## 2015-09-26 DIAGNOSIS — I70202 Unspecified atherosclerosis of native arteries of extremities, left leg: Secondary | ICD-10-CM | POA: Diagnosis not present

## 2015-09-26 DIAGNOSIS — I2510 Atherosclerotic heart disease of native coronary artery without angina pectoris: Secondary | ICD-10-CM | POA: Diagnosis not present

## 2015-09-27 ENCOUNTER — Ambulatory Visit: Payer: Self-pay | Admitting: *Deleted

## 2015-09-28 DIAGNOSIS — L97322 Non-pressure chronic ulcer of left ankle with fat layer exposed: Secondary | ICD-10-CM | POA: Diagnosis not present

## 2015-09-28 DIAGNOSIS — I87332 Chronic venous hypertension (idiopathic) with ulcer and inflammation of left lower extremity: Secondary | ICD-10-CM | POA: Diagnosis not present

## 2015-09-28 DIAGNOSIS — I509 Heart failure, unspecified: Secondary | ICD-10-CM | POA: Diagnosis not present

## 2015-09-28 DIAGNOSIS — I2510 Atherosclerotic heart disease of native coronary artery without angina pectoris: Secondary | ICD-10-CM | POA: Diagnosis not present

## 2015-09-28 DIAGNOSIS — I70202 Unspecified atherosclerosis of native arteries of extremities, left leg: Secondary | ICD-10-CM | POA: Diagnosis not present

## 2015-09-28 DIAGNOSIS — I110 Hypertensive heart disease with heart failure: Secondary | ICD-10-CM | POA: Diagnosis not present

## 2015-10-03 ENCOUNTER — Encounter: Payer: Self-pay | Admitting: *Deleted

## 2015-10-03 DIAGNOSIS — I70202 Unspecified atherosclerosis of native arteries of extremities, left leg: Secondary | ICD-10-CM | POA: Diagnosis not present

## 2015-10-03 DIAGNOSIS — I110 Hypertensive heart disease with heart failure: Secondary | ICD-10-CM | POA: Diagnosis not present

## 2015-10-03 DIAGNOSIS — I509 Heart failure, unspecified: Secondary | ICD-10-CM | POA: Diagnosis not present

## 2015-10-03 DIAGNOSIS — I87332 Chronic venous hypertension (idiopathic) with ulcer and inflammation of left lower extremity: Secondary | ICD-10-CM | POA: Diagnosis not present

## 2015-10-03 DIAGNOSIS — I2510 Atherosclerotic heart disease of native coronary artery without angina pectoris: Secondary | ICD-10-CM | POA: Diagnosis not present

## 2015-10-03 DIAGNOSIS — L97322 Non-pressure chronic ulcer of left ankle with fat layer exposed: Secondary | ICD-10-CM | POA: Diagnosis not present

## 2015-10-04 DIAGNOSIS — N401 Enlarged prostate with lower urinary tract symptoms: Secondary | ICD-10-CM | POA: Diagnosis not present

## 2015-10-04 DIAGNOSIS — N3281 Overactive bladder: Secondary | ICD-10-CM | POA: Diagnosis not present

## 2015-10-04 DIAGNOSIS — Z125 Encounter for screening for malignant neoplasm of prostate: Secondary | ICD-10-CM | POA: Diagnosis not present

## 2015-10-05 DIAGNOSIS — L98499 Non-pressure chronic ulcer of skin of other sites with unspecified severity: Secondary | ICD-10-CM | POA: Diagnosis not present

## 2015-10-05 DIAGNOSIS — I771 Stricture of artery: Secondary | ICD-10-CM | POA: Diagnosis not present

## 2015-10-06 DIAGNOSIS — I70202 Unspecified atherosclerosis of native arteries of extremities, left leg: Secondary | ICD-10-CM | POA: Diagnosis not present

## 2015-10-06 DIAGNOSIS — I87332 Chronic venous hypertension (idiopathic) with ulcer and inflammation of left lower extremity: Secondary | ICD-10-CM | POA: Diagnosis not present

## 2015-10-06 DIAGNOSIS — I110 Hypertensive heart disease with heart failure: Secondary | ICD-10-CM | POA: Diagnosis not present

## 2015-10-06 DIAGNOSIS — L97322 Non-pressure chronic ulcer of left ankle with fat layer exposed: Secondary | ICD-10-CM | POA: Diagnosis not present

## 2015-10-06 DIAGNOSIS — I2510 Atherosclerotic heart disease of native coronary artery without angina pectoris: Secondary | ICD-10-CM | POA: Diagnosis not present

## 2015-10-06 DIAGNOSIS — I509 Heart failure, unspecified: Secondary | ICD-10-CM | POA: Diagnosis not present

## 2015-10-10 DIAGNOSIS — I87332 Chronic venous hypertension (idiopathic) with ulcer and inflammation of left lower extremity: Secondary | ICD-10-CM | POA: Diagnosis not present

## 2015-10-10 DIAGNOSIS — L97322 Non-pressure chronic ulcer of left ankle with fat layer exposed: Secondary | ICD-10-CM | POA: Diagnosis not present

## 2015-10-10 DIAGNOSIS — I2510 Atherosclerotic heart disease of native coronary artery without angina pectoris: Secondary | ICD-10-CM | POA: Diagnosis not present

## 2015-10-10 DIAGNOSIS — I70202 Unspecified atherosclerosis of native arteries of extremities, left leg: Secondary | ICD-10-CM | POA: Diagnosis not present

## 2015-10-10 DIAGNOSIS — I110 Hypertensive heart disease with heart failure: Secondary | ICD-10-CM | POA: Diagnosis not present

## 2015-10-10 DIAGNOSIS — I509 Heart failure, unspecified: Secondary | ICD-10-CM | POA: Diagnosis not present

## 2015-10-11 ENCOUNTER — Other Ambulatory Visit: Payer: Self-pay | Admitting: Cardiovascular Disease

## 2015-10-11 ENCOUNTER — Other Ambulatory Visit: Payer: Self-pay | Admitting: *Deleted

## 2015-10-11 NOTE — Patient Outreach (Signed)
Gavin [name]) Care Management   10/11/2015  Gavin [name] [birth year] [id number]  Gavin [name] is an 82 y.o. male  Subjective: " Knock on wood , I think I am doing better" Discussed improved appetite and strength   Objective:   Review of Systems  Constitutional: Negative.   HENT: Negative.   Respiratory: Negative for shortness of breath.        CPAP at night, reports sleeping well  Cardiovascular: Positive for leg swelling. Negative for chest pain.       Bilateral lower leg swelling, left greater than right  Gastrointestinal: Negative.   Genitourinary:       Incontinence  Musculoskeletal: Positive for joint pain. Negative for falls.       Bilateral shoulder pain  Neurological: Negative.   Psychiatric/Behavioral: Negative for depression. Gavin [name] is not nervous/anxious.        Difficulty with memory, unable to recall information that Gavin [name] once knew.  BP 134/60 mmHg  Pulse 70  Resp 18  SpO2 98%  Physical Exam  Constitutional: Gavin [name] is oriented to person, place, and time. Gavin [name] appears well-developed and well-nourished.  Cardiovascular: Normal rate, regular rhythm and normal heart sounds.   Faint pedal pulses bilaterally, feet warm  Respiratory: Breath sounds normal.  GI: Soft.  Neurological: Gavin [name] is alert and oriented to person, place, and time.  Skin: Skin is warm and dry.     Psychiatric: Gavin [name] has a normal mood and affect. Gavin [name] behavior is normal. Judgment and thought content normal.    Current Medications:   Current Outpatient Prescriptions  Medication Sig Dispense Refill  . acetaminophen (TYLENOL) 500 MG tablet Take 1,000 mg by mouth every 6 (six) hours as needed for moderate pain.    Marland Kitchen amLODipine (NORVASC) 10 MG tablet Take 10 mg by mouth daily.    Marland Kitchen aspirin EC 81 MG tablet Take 81 mg by mouth at bedtime.     Marland Kitchen atorvastatin (LIPITOR) 40 MG tablet Take 1 tablet (40 mg total) by mouth at bedtime. 31 tablet 11  . Cholecalciferol (VITAMIN D3) 2000 UNITS TABS  Take 1 tablet by mouth.    . clopidogrel (PLAVIX) 75 MG tablet Take 1 tablet (75 mg total) by mouth daily. 30 tablet 11  . Cyanocobalamin (VITAMIN B 12 PO) Take 1 tablet by mouth daily.    . DULoxetine (CYMBALTA) 60 MG capsule Take 60 mg by mouth daily.  0  . feeding supplement, ENSURE ENLIVE, (ENSURE ENLIVE) LIQD Take 237 mLs by mouth 2 (two) times daily between meals. 237 mL 12  . fexofenadine (ALLEGRA) 180 MG tablet Take 180 mg by mouth daily.    . furosemide (LASIX) 40 MG tablet Take 0.5 tablets (20 mg total) by mouth daily. 30 tablet 0  . isosorbide mononitrate (IMDUR) 30 MG 24 hr tablet Take 30 mg by mouth 2 (two) times daily.   0  . Ketotifen Fumarate (ALAWAY OP) Place 1 drop into both eyes 2 (two) times daily as needed (dry eyes).    . Memantine HCl-Donepezil HCl (NAMZARIC) 28-10 MG CP24 Take 1 mg by mouth.    . Menthol, Topical Analgesic, (BIOFREEZE EX) Apply 1 application topically daily as needed (pain).    Marland Kitchen metolazone (ZAROXOLYN) 2.5 MG tablet Take 2.5 mg by mouth every other day as needed (for fluid). Every other day    . metoprolol succinate (TOPROL-XL) 50 MG 24 hr tablet Take 50 mg by mouth 2 (two) times daily. 50 mg twice daily    .  mirtazapine (REMERON) 15 MG tablet Take 15 mg by mouth at bedtime.    . Multiple Vitamin (MULTIVITAMIN WITH MINERALS) TABS tablet Take 1 tablet by mouth daily.    . nitroGLYCERIN (NITROSTAT) 0.4 MG SL tablet Place 0.4 mg under Gavin tongue every 5 (five) minutes as needed for chest pain.    Marland Kitchen oxybutynin (DITROPAN) 5 MG tablet Take 5 mg by mouth 3 (three) times daily.    Marland Kitchen oxyCODONE-acetaminophen (PERCOCET/ROXICET) 5-325 MG tablet Take 1 tablet by mouth every 6 (six) hours as needed for severe pain.    . pantoprazole (PROTONIX) 40 MG tablet take 1 tablet by mouth once daily 30 tablet 6  . potassium chloride SA (K-DUR,KLOR-CON) 20 MEQ tablet Take 1 tablet (20 mEq total) by mouth daily. 30 tablet 11  . QUEtiapine (SEROQUEL) 50 MG tablet Take 50 mg by mouth  daily before supper.   1  . RANEXA 1000 MG SR tablet Take 1,000 mg by mouth 2 (two) times daily.  0  . saccharomyces boulardii (FLORASTOR) 250 MG capsule Take 250 mg by mouth 2 (two) times daily.    Marland Kitchen SANTYL ointment Apply 1 application topically daily.  0  . tamsulosin (FLOMAX) 0.4 MG CAPS capsule Take 0.4 mg by mouth daily after supper.    Marland Kitchen albuterol (PROVENTIL HFA;VENTOLIN HFA) 108 (90 BASE) MCG/ACT inhaler Inhale into Gavin lungs every 6 (six) hours as needed for wheezing or shortness of breath. Reported on 10/11/2015    . Ampicillin-Sulbactam (UNASYN) 3 (2-1) G SOLR injection Inject 3 g into Gavin vein every 8 (eight) hours. Reported on 10/11/2015     No current facility-administered medications for this visit.    Functional Status:   In your present state of health, do you have any difficulty performing Gavin following activities: 08/02/2015 05/22/2015  Hearing? N -  Vision? N -  Difficulty concentrating or making decisions? Y -  Walking or climbing stairs? Y -  Dressing or bathing? N -  Doing errands, shopping? N Y  Conservation officer, nature and eating ? N -  Using Gavin Toilet? N -  In Gavin past six months, have you accidently leaked urine? Y -  Do you have problems with loss of bowel control? N -  Managing your Medications? N -  Managing your Finances? N -  Housekeeping or managing your Housekeeping? N -    Fall/Depression Screening:    PHQ 2/9 Scores 10/11/2015 08/02/2015 03/28/2015 02/03/2015  PHQ - 2 Score 0 0 0 0    Assessment:    Routine Home Visit: Gavin [name] still has involvement from [facility] Nurse twice weekly for wound care.  Falls No Falls, Gavin [name] to use Gavin [name] rolling walker at all times, and [name] to work on walking exercise in Gavin home.  Blood Pressure Stable, Gavin [name] [name] to monitor it at least 3 days a week,    Lower Extremity Swelling Gavin [name] to have edema in left leg greater than right, per Gavin [name] Gavin [name] it looks better today. Gavin [name]  to weigh daily, without increases, today's weight 190, she states that Gavin [name] takes prn metolazone at least once a week.    Left Leg Wound       Dressing intact to leg wound, Gavin [name] to have a home health RN visit twice weekly for wound care, and Gavin [name] changes dressing every other day. Gavin [name] states that at office visit with Dr.Morehead this month that Gavin wound is getting smaller. Gavin [name] encouraged regarding keeping Gavin [name] legs elevated as much  as possible during Gavin day, Gavin [name] reports that Gavin [name] is doing better with this and Gavin [name] Gavin [name] endorses this.  Gavin [name] is very pleased with Gavin [name], states Gavin [name] is eating better, sleeping better, trying to keep Gavin [name] legs elevated and she believes Gavin [name] dementia is better, Gavin [name] is now enjoying reading books.              Plan:   Gavin [name] centered goals reviewed, Gavin [name] progressing well and goals being met, discussed case closure at next visit RNCM will schedule home visit Feb. 16                  Joylene Draft, RN, [facility] [phone] Mobile [phone]- [place]

## 2015-10-12 DIAGNOSIS — H26493 Other secondary cataract, bilateral: Secondary | ICD-10-CM | POA: Diagnosis not present

## 2015-10-12 DIAGNOSIS — H524 Presbyopia: Secondary | ICD-10-CM | POA: Diagnosis not present

## 2015-10-12 NOTE — Telephone Encounter (Signed)
Rx(s) sent to pharmacy electronically.  

## 2015-10-13 DIAGNOSIS — I2510 Atherosclerotic heart disease of native coronary artery without angina pectoris: Secondary | ICD-10-CM | POA: Diagnosis not present

## 2015-10-13 DIAGNOSIS — I87332 Chronic venous hypertension (idiopathic) with ulcer and inflammation of left lower extremity: Secondary | ICD-10-CM | POA: Diagnosis not present

## 2015-10-13 DIAGNOSIS — I509 Heart failure, unspecified: Secondary | ICD-10-CM | POA: Diagnosis not present

## 2015-10-13 DIAGNOSIS — I110 Hypertensive heart disease with heart failure: Secondary | ICD-10-CM | POA: Diagnosis not present

## 2015-10-13 DIAGNOSIS — I70202 Unspecified atherosclerosis of native arteries of extremities, left leg: Secondary | ICD-10-CM | POA: Diagnosis not present

## 2015-10-13 DIAGNOSIS — L97322 Non-pressure chronic ulcer of left ankle with fat layer exposed: Secondary | ICD-10-CM | POA: Diagnosis not present

## 2015-10-16 DIAGNOSIS — I70202 Unspecified atherosclerosis of native arteries of extremities, left leg: Secondary | ICD-10-CM | POA: Diagnosis not present

## 2015-10-16 DIAGNOSIS — L97322 Non-pressure chronic ulcer of left ankle with fat layer exposed: Secondary | ICD-10-CM | POA: Diagnosis not present

## 2015-10-16 DIAGNOSIS — I110 Hypertensive heart disease with heart failure: Secondary | ICD-10-CM | POA: Diagnosis not present

## 2015-10-16 DIAGNOSIS — I2510 Atherosclerotic heart disease of native coronary artery without angina pectoris: Secondary | ICD-10-CM | POA: Diagnosis not present

## 2015-10-16 DIAGNOSIS — I87332 Chronic venous hypertension (idiopathic) with ulcer and inflammation of left lower extremity: Secondary | ICD-10-CM | POA: Diagnosis not present

## 2015-10-16 DIAGNOSIS — I509 Heart failure, unspecified: Secondary | ICD-10-CM | POA: Diagnosis not present

## 2015-10-19 DIAGNOSIS — I2510 Atherosclerotic heart disease of native coronary artery without angina pectoris: Secondary | ICD-10-CM | POA: Diagnosis not present

## 2015-10-19 DIAGNOSIS — L97322 Non-pressure chronic ulcer of left ankle with fat layer exposed: Secondary | ICD-10-CM | POA: Diagnosis not present

## 2015-10-19 DIAGNOSIS — I110 Hypertensive heart disease with heart failure: Secondary | ICD-10-CM | POA: Diagnosis not present

## 2015-10-19 DIAGNOSIS — I509 Heart failure, unspecified: Secondary | ICD-10-CM | POA: Diagnosis not present

## 2015-10-19 DIAGNOSIS — I70202 Unspecified atherosclerosis of native arteries of extremities, left leg: Secondary | ICD-10-CM | POA: Diagnosis not present

## 2015-10-19 DIAGNOSIS — I87332 Chronic venous hypertension (idiopathic) with ulcer and inflammation of left lower extremity: Secondary | ICD-10-CM | POA: Diagnosis not present

## 2015-10-23 DIAGNOSIS — I70202 Unspecified atherosclerosis of native arteries of extremities, left leg: Secondary | ICD-10-CM | POA: Diagnosis not present

## 2015-10-23 DIAGNOSIS — I110 Hypertensive heart disease with heart failure: Secondary | ICD-10-CM | POA: Diagnosis not present

## 2015-10-23 DIAGNOSIS — I87332 Chronic venous hypertension (idiopathic) with ulcer and inflammation of left lower extremity: Secondary | ICD-10-CM | POA: Diagnosis not present

## 2015-10-23 DIAGNOSIS — L97322 Non-pressure chronic ulcer of left ankle with fat layer exposed: Secondary | ICD-10-CM | POA: Diagnosis not present

## 2015-10-23 DIAGNOSIS — I509 Heart failure, unspecified: Secondary | ICD-10-CM | POA: Diagnosis not present

## 2015-10-23 DIAGNOSIS — I2510 Atherosclerotic heart disease of native coronary artery without angina pectoris: Secondary | ICD-10-CM | POA: Diagnosis not present

## 2015-10-25 DIAGNOSIS — R600 Localized edema: Secondary | ICD-10-CM | POA: Diagnosis not present

## 2015-10-25 DIAGNOSIS — S81802D Unspecified open wound, left lower leg, subsequent encounter: Secondary | ICD-10-CM | POA: Diagnosis not present

## 2015-10-25 DIAGNOSIS — R441 Visual hallucinations: Secondary | ICD-10-CM | POA: Diagnosis not present

## 2015-10-26 DIAGNOSIS — I70202 Unspecified atherosclerosis of native arteries of extremities, left leg: Secondary | ICD-10-CM | POA: Diagnosis not present

## 2015-10-26 DIAGNOSIS — I509 Heart failure, unspecified: Secondary | ICD-10-CM | POA: Diagnosis not present

## 2015-10-26 DIAGNOSIS — I110 Hypertensive heart disease with heart failure: Secondary | ICD-10-CM | POA: Diagnosis not present

## 2015-10-26 DIAGNOSIS — L97322 Non-pressure chronic ulcer of left ankle with fat layer exposed: Secondary | ICD-10-CM | POA: Diagnosis not present

## 2015-10-26 DIAGNOSIS — I87332 Chronic venous hypertension (idiopathic) with ulcer and inflammation of left lower extremity: Secondary | ICD-10-CM | POA: Diagnosis not present

## 2015-10-26 DIAGNOSIS — I2510 Atherosclerotic heart disease of native coronary artery without angina pectoris: Secondary | ICD-10-CM | POA: Diagnosis not present

## 2015-10-30 DIAGNOSIS — F3289 Other specified depressive episodes: Secondary | ICD-10-CM | POA: Diagnosis not present

## 2015-10-30 DIAGNOSIS — I87332 Chronic venous hypertension (idiopathic) with ulcer and inflammation of left lower extremity: Secondary | ICD-10-CM | POA: Diagnosis not present

## 2015-10-30 DIAGNOSIS — I252 Old myocardial infarction: Secondary | ICD-10-CM | POA: Diagnosis not present

## 2015-10-30 DIAGNOSIS — Z7982 Long term (current) use of aspirin: Secondary | ICD-10-CM | POA: Diagnosis not present

## 2015-10-30 DIAGNOSIS — Z79891 Long term (current) use of opiate analgesic: Secondary | ICD-10-CM | POA: Diagnosis not present

## 2015-10-30 DIAGNOSIS — I70202 Unspecified atherosclerosis of native arteries of extremities, left leg: Secondary | ICD-10-CM | POA: Diagnosis not present

## 2015-10-30 DIAGNOSIS — Z9181 History of falling: Secondary | ICD-10-CM | POA: Diagnosis not present

## 2015-10-30 DIAGNOSIS — I2510 Atherosclerotic heart disease of native coronary artery without angina pectoris: Secondary | ICD-10-CM | POA: Diagnosis not present

## 2015-10-30 DIAGNOSIS — I110 Hypertensive heart disease with heart failure: Secondary | ICD-10-CM | POA: Diagnosis not present

## 2015-10-30 DIAGNOSIS — I679 Cerebrovascular disease, unspecified: Secondary | ICD-10-CM | POA: Diagnosis not present

## 2015-10-30 DIAGNOSIS — L97322 Non-pressure chronic ulcer of left ankle with fat layer exposed: Secondary | ICD-10-CM | POA: Diagnosis not present

## 2015-10-30 DIAGNOSIS — F29 Unspecified psychosis not due to a substance or known physiological condition: Secondary | ICD-10-CM | POA: Diagnosis not present

## 2015-10-30 DIAGNOSIS — I509 Heart failure, unspecified: Secondary | ICD-10-CM | POA: Diagnosis not present

## 2015-10-30 DIAGNOSIS — F0150 Vascular dementia without behavioral disturbance: Secondary | ICD-10-CM | POA: Diagnosis not present

## 2015-10-30 DIAGNOSIS — I480 Paroxysmal atrial fibrillation: Secondary | ICD-10-CM | POA: Diagnosis not present

## 2015-11-02 DIAGNOSIS — I110 Hypertensive heart disease with heart failure: Secondary | ICD-10-CM | POA: Diagnosis not present

## 2015-11-02 DIAGNOSIS — I87332 Chronic venous hypertension (idiopathic) with ulcer and inflammation of left lower extremity: Secondary | ICD-10-CM | POA: Diagnosis not present

## 2015-11-02 DIAGNOSIS — L97322 Non-pressure chronic ulcer of left ankle with fat layer exposed: Secondary | ICD-10-CM | POA: Diagnosis not present

## 2015-11-02 DIAGNOSIS — I509 Heart failure, unspecified: Secondary | ICD-10-CM | POA: Diagnosis not present

## 2015-11-02 DIAGNOSIS — I70202 Unspecified atherosclerosis of native arteries of extremities, left leg: Secondary | ICD-10-CM | POA: Diagnosis not present

## 2015-11-02 DIAGNOSIS — I2510 Atherosclerotic heart disease of native coronary artery without angina pectoris: Secondary | ICD-10-CM | POA: Diagnosis not present

## 2015-11-03 DIAGNOSIS — I87332 Chronic venous hypertension (idiopathic) with ulcer and inflammation of left lower extremity: Secondary | ICD-10-CM | POA: Diagnosis not present

## 2015-11-06 DIAGNOSIS — I509 Heart failure, unspecified: Secondary | ICD-10-CM | POA: Diagnosis not present

## 2015-11-06 DIAGNOSIS — I70202 Unspecified atherosclerosis of native arteries of extremities, left leg: Secondary | ICD-10-CM | POA: Diagnosis not present

## 2015-11-06 DIAGNOSIS — L97322 Non-pressure chronic ulcer of left ankle with fat layer exposed: Secondary | ICD-10-CM | POA: Diagnosis not present

## 2015-11-06 DIAGNOSIS — N401 Enlarged prostate with lower urinary tract symptoms: Secondary | ICD-10-CM | POA: Diagnosis not present

## 2015-11-06 DIAGNOSIS — I2510 Atherosclerotic heart disease of native coronary artery without angina pectoris: Secondary | ICD-10-CM | POA: Diagnosis not present

## 2015-11-06 DIAGNOSIS — I87332 Chronic venous hypertension (idiopathic) with ulcer and inflammation of left lower extremity: Secondary | ICD-10-CM | POA: Diagnosis not present

## 2015-11-06 DIAGNOSIS — I110 Hypertensive heart disease with heart failure: Secondary | ICD-10-CM | POA: Diagnosis not present

## 2015-11-06 DIAGNOSIS — N3281 Overactive bladder: Secondary | ICD-10-CM | POA: Diagnosis not present

## 2015-11-08 ENCOUNTER — Encounter: Payer: Self-pay | Admitting: Cardiovascular Disease

## 2015-11-08 ENCOUNTER — Ambulatory Visit (INDEPENDENT_AMBULATORY_CARE_PROVIDER_SITE_OTHER): Payer: Medicare Other | Admitting: Cardiovascular Disease

## 2015-11-08 VITALS — BP 164/60 | HR 72 | Ht 67.0 in | Wt 195.0 lb

## 2015-11-08 DIAGNOSIS — I739 Peripheral vascular disease, unspecified: Secondary | ICD-10-CM

## 2015-11-08 DIAGNOSIS — Z9861 Coronary angioplasty status: Secondary | ICD-10-CM

## 2015-11-08 DIAGNOSIS — I480 Paroxysmal atrial fibrillation: Secondary | ICD-10-CM

## 2015-11-08 DIAGNOSIS — I2510 Atherosclerotic heart disease of native coronary artery without angina pectoris: Secondary | ICD-10-CM | POA: Diagnosis not present

## 2015-11-08 DIAGNOSIS — I10 Essential (primary) hypertension: Secondary | ICD-10-CM | POA: Diagnosis not present

## 2015-11-08 DIAGNOSIS — I779 Disorder of arteries and arterioles, unspecified: Secondary | ICD-10-CM | POA: Diagnosis not present

## 2015-11-08 NOTE — Assessment & Plan Note (Addendum)
History of CAD status post non-STEMI treated with PCI and stenting of a high-grade RCA stenosis by Dr. Burt Knack. He does have a known totally occluded LAD by cath back in July 2010. He denies chest pain or shortness of breath.

## 2015-11-08 NOTE — Assessment & Plan Note (Signed)
History of carotid artery disease with Dopplers performed 09/12/15 revealing moderate right and moderate to mildly severe left ICA stenosis. This is followed semiannually

## 2015-11-08 NOTE — Progress Notes (Signed)
11/08/2015 Gavin [name]   12/23/1928  [id number]  Primary Physician Ann Held, MD Primary Cardiologist: [name] [name]   HPI: 43-old white married male with a history of coronary disease with last cardiac catheterization July 2010 revealing an occluded LAD stent otherwise normal coronary arteries. At that time his EF was 30%, subsequent 2-D echo in 2013 revealed EF is normal. He had a stress test in 2012 with mod to severe perfusion defect, maybe slightly worse than previous study.At that time the pt was without symptoms so no further work up was done. He also has a history of PAF and is on Coumadin for anticoagulation. He has been maintaining sinus rhythm. He has peripheral vascular disease with stenting by Dr. [name] to both SFAs in 2004 with 0 vessel runoff on the right and one on the left via peroneal. he also has bilateral carotid disease left greater than right. Other problems include hypertension hyperlipidemia, and obstructive sleep apnea on CPAP. He has chronic headache with neg. head CT in June, and recently per our office stopped his IMDUR to see if Headache would resolve. It did not. He resumed the Imdur 05/29/13. He was admitted 05/29/13 with recurrent chest pain worrisome for unstable angina. His Coumadin was held. His Troponin was negative. He underwent coronary angiogram 06/01/13. This revealed moderate RCA, ostial CFX, and distal Dx1 disease with know distal LAD occlusion. There was no obvious culprit lesion and the plan is for medical Rx. Norvasc and Ranexa were added and his Imdur was decreased.he was seen by [name] [facility] in the office 06/08/13 for post consultation followup. . I last saw him in the office earlier this year. He has paroxysmal atrial fibrillation on Coumadin anticoagulation. He has fallen several times one involving head trauma.  Since I saw him in the office one year ago he was admitted back in February for right shoulder pain which  ended up being a non-STEMI. He was catheterized by Dr. Burt Knack revealing 80% mid dominant RCA stenosis which was stented using a drug-eluting stent. He is placed on Brilenta. Since that time he noticed increasing shortness of breath. Since I saw him in the office 2 months ago he developed a nonhealing wound on the medial aspect of his left calf. Dopplers revealed decrease in his ABI from 0.86 on the left with a patent SFA to non-measurable because of pain and an occluded left SFA. I did transition him from related to Plavix because of shortness of breath. Because of critical limb ischemia and a nonhealing ulcer on his left leg he underwent angiography by myself 05/22/15 with recent stenting of an occluded distal left SFA. His follow-up Dopplers were markedly improved and his wound slowly healing under the auspices of a Garment/textile technologist in [place].  Current Outpatient Prescriptions  Medication Sig Dispense Refill  . acetaminophen (TYLENOL) 500 MG tablet Take 1,000 mg by mouth every 6 (six) hours as needed for moderate pain.    Marland Kitchen albuterol (PROVENTIL HFA;VENTOLIN HFA) 108 (90 BASE) MCG/ACT inhaler Inhale into the lungs every 6 (six) hours as needed for wheezing or shortness of breath. Reported on 10/11/2015    . amLODipine (NORVASC) 10 MG tablet Take 10 mg by mouth daily.    Marland Kitchen aspirin EC 81 MG tablet Take 81 mg by mouth at bedtime.     Marland Kitchen atorvastatin (LIPITOR) 40 MG tablet Take 1 tablet (40 mg total) by mouth at bedtime. 31 tablet 11  . Cholecalciferol (VITAMIN D3)  2000 UNITS TABS Take 1 tablet by mouth.    . clopidogrel (PLAVIX) 75 MG tablet Take 1 tablet (75 mg total) by mouth daily. 30 tablet 11  . Cyanocobalamin (VITAMIN B 12 PO) Take 1 tablet by mouth daily.    . DULoxetine (CYMBALTA) 60 MG capsule Take 60 mg by mouth daily.  0  . feeding supplement, ENSURE ENLIVE, (ENSURE ENLIVE) LIQD Take 237 mLs by mouth 2 (two) times daily between meals. 237 mL 12  . fexofenadine (ALLEGRA) 180 MG tablet Take 180 mg  by mouth daily.    . furosemide (LASIX) 40 MG tablet Take 0.5 tablets (20 mg total) by mouth daily. 30 tablet 0  . isosorbide mononitrate (IMDUR) 30 MG 24 hr tablet Take 30 mg by mouth 2 (two) times daily.   0  . Ketotifen Fumarate (ALAWAY OP) Place 1 drop into both eyes 2 (two) times daily as needed (dry eyes).    . Memantine HCl-Donepezil HCl (NAMZARIC) 28-10 MG CP24 Take 1 mg by mouth.    . metolazone (ZAROXOLYN) 2.5 MG tablet Take 2.5 mg by mouth every other day as needed (for fluid). Every other day    . metoprolol succinate (TOPROL-XL) 50 MG 24 hr tablet Take 50 mg by mouth 2 (two) times daily. 50 mg twice daily    . mirtazapine (REMERON) 15 MG tablet Take 15 mg by mouth at bedtime.    . Multiple Vitamin (MULTIVITAMIN WITH MINERALS) TABS tablet Take 1 tablet by mouth daily.    . nitroGLYCERIN (NITROSTAT) 0.4 MG SL tablet Place 0.4 mg under the tongue every 5 (five) minutes as needed for chest pain.    Marland Kitchen oxybutynin (DITROPAN) 5 MG tablet Take 5 mg by mouth 3 (three) times daily.    Marland Kitchen oxyCODONE-acetaminophen (PERCOCET/ROXICET) 5-325 MG tablet Take 1 tablet by mouth every 6 (six) hours as needed for severe pain.    . pantoprazole (PROTONIX) 40 MG tablet take 1 tablet by mouth once daily 30 tablet 6  . potassium chloride SA (K-DUR,KLOR-CON) 20 MEQ tablet Take 1 tablet (20 mEq total) by mouth daily. 30 tablet 11  . QUEtiapine (SEROQUEL) 50 MG tablet Take 50 mg by mouth daily before supper.   1  . RANEXA 500 MG 12 hr tablet take 1 tablet by mouth twice a day 60 tablet 6  . saccharomyces boulardii (FLORASTOR) 250 MG capsule Take 250 mg by mouth 2 (two) times daily.    Marland Kitchen SANTYL ointment Apply 1 application topically daily.  0  . tamsulosin (FLOMAX) 0.4 MG CAPS capsule Take 0.4 mg by mouth daily after supper.     No current facility-administered medications for this visit.    Allergies  Allergen Reactions  . Ciprofloxacin     unknown  . Ciprofloxacin Other (See Comments)    Unknown allergic  reaction, possible shortness of breath  . Codeine     "knocked me out"  . Codeine Other (See Comments)    "knocked me out"  . Dexamethasone     unknown  . Dexamethasone Other (See Comments)    Unknown allergic reaction  . Sulfa Antibiotics Nausea And Vomiting  . Sulfonamide Derivatives     Unknown reaction  . Telmisartan     unknown  . Telmisartan Other (See Comments)    Micardis - heart races    Social History   Social History  . Marital Status: Married    Spouse Name: N/A  . Number of Children: N/A  . Years of Education:  N/A   Occupational History  . 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 Smoking status: Former Smoker -- 0.75 packs/day for 20 years    Types: Cigarettes    Quit date: 09/23/1973  . Smokeless tobacco: Former Systems developer    Types: Snuff, Chew     Comment: tried snuff and chew as a child (35yr) did not continue  . Alcohol Use: Yes     Comment: 05/22/2015 "seldom-- every three or four years"  . Drug Use: No  . Sexual Activity: Not Currently   Other Topics Concern  . Not on file   Social History Narrative   ** Merged History Encounter **         Review of Systems: General: negative for chills, fever, night sweats or weight changes.  Cardiovascular: negative for chest pain, dyspnea on exertion, edema, orthopnea, palpitations, paroxysmal nocturnal dyspnea or shortness of breath Dermatological: negative for rash Respiratory: negative for cough or wheezing Urologic: negative for hematuria Abdominal: negative for nausea, vomiting, diarrhea, bright red blood per rectum, melena, or hematemesis Neurologic: negative for visual changes, syncope, or dizziness All other systems reviewed and are otherwise negative except as noted above.    Blood pressure 164/60, pulse 72, height 5\' 7"  (1.702 m), weight 195 lb (88.451 kg).  General appearance: alert and no distress Neck: no adenopathy, no carotid bruit, no JVD, supple,  symmetrical, trachea midline and thyroid not enlarged, symmetric, no tenderness/mass/nodules Lungs: clear to auscultation bilaterally Heart: regular rate and rhythm, S1, S2 normal, no murmur, click, rub or gallop Extremities: extremities normal, atraumatic, no cyanosis or edema  EKG not performed today  ASSESSMENT AND PLAN:   PAF- CHADs VASc = 4. Not on anticoagulation secondary to hx of falls History of PAF not on oral anticoagulation (The CHA2DSVASC2 score is  4 .) because of history of falls  PAD, history of stenting to both SFAs with Lt SFA PTA 05/22/15 History of peripheral arterial disease status post bilateral SFA intervention. Because of critical limb ischemia by angiogram tomorrow on 05/22/15 and restented a distal left SFA occlusion with marked improvement in his circulation. His wound on his left foot is slowly healing as being followed by a surgeon locally and aspirin.  HLD (hyperlipidemia) History of hyperlipidemia on statin therapy with recent lipid profile performed 11/14/14 revealed a total cholesterol of 154, LDL 95 HDL 43.  CAD S/P multiple PCI's, last was RCA DES Feb 2016 History of CAD status post non-STEMI treated with PCI and stenting of a high-grade RCA stenosis by Dr. Burt Knack. He does have a known totally occluded LAD by cath back in July 2010. He denies chest pain or shortness of breath.  Essential hypertension History of hypertension with blood pressure measured 164/60. He is on amlodipine. His blood pressures measured by home health care have been in the 123456 range systolic. Continue current medications  Bilateral carotid artery disease (HCC) History of carotid artery disease with Dopplers performed 09/12/15 revealing moderate right and moderate to mildly severe left ICA stenosis. This is followed semiannually      [name] [facility], [facility] 11/08/2015 1:46 PM

## 2015-11-08 NOTE — Assessment & Plan Note (Signed)
History of hyperlipidemia on statin therapy with recent lipid profile performed 11/14/14 revealed a total cholesterol of 154, LDL 95 HDL 43.

## 2015-11-08 NOTE — Patient Instructions (Signed)
Medication Instructions:  Your physician recommends that you continue on your current medications as directed. Please refer to the Current Medication list given to you today.   Labwork: none  Testing/Procedures: Your physician has requested that you have a lower extremity arterial doppler- During this test, ultrasound is used to evaluate arterial blood flow in the legs. Allow approximately one hour for this exam. March 2017   Follow-Up: We request that you follow-up in: 3 months with [name] with an extender and in 6 months  with Dr Andria Rhein will receive a reminder letter in the mail two months in advance. If you don't receive a letter, please call our office to schedule the follow-up appointment.    Any Other Special Instructions Will Be Listed Below (If Applicable).     If you need a refill on your cardiac medications before your next appointment, please call your pharmacy.

## 2015-11-08 NOTE — Assessment & Plan Note (Signed)
History of hypertension with blood pressure measured 164/60. He is on amlodipine. His blood pressures measured by home health care have been in the 123456 range systolic. Continue current medications

## 2015-11-08 NOTE — Assessment & Plan Note (Signed)
History of peripheral arterial disease status post bilateral SFA intervention. Because of critical limb ischemia by angiogram tomorrow on 05/22/15 and restented a distal left SFA occlusion with marked improvement in his circulation. His wound on his left foot is slowly healing as being followed by a surgeon locally and aspirin.

## 2015-11-08 NOTE — Assessment & Plan Note (Signed)
History of PAF not on oral anticoagulation (The CHA2DSVASC2 score is  4 .) because of history of falls

## 2015-11-09 ENCOUNTER — Encounter: Payer: Self-pay | Admitting: *Deleted

## 2015-11-09 ENCOUNTER — Other Ambulatory Visit: Payer: Self-pay | Admitting: *Deleted

## 2015-11-09 DIAGNOSIS — I509 Heart failure, unspecified: Secondary | ICD-10-CM | POA: Diagnosis not present

## 2015-11-09 DIAGNOSIS — I110 Hypertensive heart disease with heart failure: Secondary | ICD-10-CM | POA: Diagnosis not present

## 2015-11-09 DIAGNOSIS — I2510 Atherosclerotic heart disease of native coronary artery without angina pectoris: Secondary | ICD-10-CM | POA: Diagnosis not present

## 2015-11-09 DIAGNOSIS — I87332 Chronic venous hypertension (idiopathic) with ulcer and inflammation of left lower extremity: Secondary | ICD-10-CM | POA: Diagnosis not present

## 2015-11-09 DIAGNOSIS — L97322 Non-pressure chronic ulcer of left ankle with fat layer exposed: Secondary | ICD-10-CM | POA: Diagnosis not present

## 2015-11-09 DIAGNOSIS — I70202 Unspecified atherosclerosis of native arteries of extremities, left leg: Secondary | ICD-10-CM | POA: Diagnosis not present

## 2015-11-09 NOTE — Patient Outreach (Addendum)
[facility]) Care Management   11/09/2015  TYRELLE RACZKA 07-Sep-1929 [id number]  ASON HESLIN is an 82 y.o. male  Subjective: " I had a hard time getting up this morning I was sleeping so well.  Objective:  BP 128/64 mmHg  Pulse 68  Resp 20  SpO2 98% Review of Systems  Constitutional: Negative.        Sleeps well  HENT: Negative.   Eyes: Negative.   Respiratory: Negative.   Cardiovascular: Positive for leg swelling.       Left leg lower edema  Gastrointestinal: Negative.   Genitourinary: Positive for frequency.       Incontinence  Musculoskeletal: Positive for joint pain.       Right shoulder   Skin:       Left lower leg wound with dressing  Neurological: Negative.   Endo/Heme/Allergies: Negative.   Psychiatric/Behavioral: Positive for memory loss. Negative for depression.       Forgetfullness    Physical Exam  Constitutional: He is oriented to person, place, and time. He appears well-developed and well-nourished.  Cardiovascular: Normal rate, regular rhythm and normal heart sounds.   Respiratory: Effort normal and breath sounds normal.  GI: Soft.  Musculoskeletal: He exhibits edema.  Bilateral lower leg edema left greater than right  Neurological: He is alert and oriented to person, place, and time.  Skin: Skin is warm and dry.     Psychiatric: He has a normal mood and affect. His behavior is normal. Judgment and thought content normal.    Current Medications:   Current Outpatient Prescriptions  Medication Sig Dispense Refill  . acetaminophen (TYLENOL) 500 MG tablet Take 1,000 mg by mouth every 6 (six) hours as needed for moderate pain.    Marland Kitchen albuterol (PROVENTIL HFA;VENTOLIN HFA) 108 (90 BASE) MCG/ACT inhaler Inhale into the lungs every 6 (six) hours as needed for wheezing or shortness of breath. Reported on 10/11/2015    . amLODipine (NORVASC) 10 MG tablet Take 10 mg by mouth daily.    Marland Kitchen aspirin EC 81 MG tablet Take 81 mg by mouth at bedtime.      Marland Kitchen atorvastatin (LIPITOR) 40 MG tablet Take 1 tablet (40 mg total) by mouth at bedtime. 31 tablet 11  . Cholecalciferol (VITAMIN D3) 2000 UNITS TABS Take 1 tablet by mouth.    . clopidogrel (PLAVIX) 75 MG tablet Take 1 tablet (75 mg total) by mouth daily. 30 tablet 11  . Cyanocobalamin (VITAMIN B 12 PO) Take 1 tablet by mouth daily.    . DULoxetine (CYMBALTA) 60 MG capsule Take 60 mg by mouth daily.  0  . feeding supplement, ENSURE ENLIVE, (ENSURE ENLIVE) LIQD Take 237 mLs by mouth 2 (two) times daily between meals. 237 mL 12  . fexofenadine (ALLEGRA) 180 MG tablet Take 180 mg by mouth daily.    . furosemide (LASIX) 40 MG tablet Take 0.5 tablets (20 mg total) by mouth daily. 30 tablet 0  . isosorbide mononitrate (IMDUR) 30 MG 24 hr tablet Take 30 mg by mouth 2 (two) times daily.   0  . Ketotifen Fumarate (ALAWAY OP) Place 1 drop into both eyes 2 (two) times daily as needed (dry eyes).    . Memantine HCl-Donepezil HCl (NAMZARIC) 28-10 MG CP24 Take 1 mg by mouth.    . metolazone (ZAROXOLYN) 2.5 MG tablet Take 2.5 mg by mouth every other day as needed (for fluid). Every other day    . metoprolol succinate (TOPROL-XL) 50 MG 24  hr tablet Take 50 mg by mouth 2 (two) times daily. 50 mg twice daily    . mirtazapine (REMERON) 15 MG tablet Take 15 mg by mouth at bedtime.    . Multiple Vitamin (MULTIVITAMIN WITH MINERALS) TABS tablet Take 1 tablet by mouth daily.    . nitroGLYCERIN (NITROSTAT) 0.4 MG SL tablet Place 0.4 mg under the tongue every 5 (five) minutes as needed for chest pain.    Marland Kitchen oxybutynin (DITROPAN) 5 MG tablet Take 5 mg by mouth 3 (three) times daily.    Marland Kitchen oxyCODONE-acetaminophen (PERCOCET/ROXICET) 5-325 MG tablet Take 1 tablet by mouth every 6 (six) hours as needed for severe pain.    . pantoprazole (PROTONIX) 40 MG tablet take 1 tablet by mouth once daily 30 tablet 6  . potassium chloride SA (K-DUR,KLOR-CON) 20 MEQ tablet Take 1 tablet (20 mEq total) by mouth daily. 30 tablet 11  .  QUEtiapine (SEROQUEL) 50 MG tablet Take 50 mg by mouth daily before supper.   1  . RANEXA 500 MG 12 hr tablet take 1 tablet by mouth twice a day 60 tablet 6  . saccharomyces boulardii (FLORASTOR) 250 MG capsule Take 250 mg by mouth 2 (two) times daily.    Marland Kitchen SANTYL ointment Apply 1 application topically daily.  0  . tamsulosin (FLOMAX) 0.4 MG CAPS capsule Take 0.4 mg by mouth daily after supper.     No current facility-administered medications for this visit.    Functional Status:   In your present state of health, do you have any difficulty performing the following activities: 08/02/2015 05/22/2015  Hearing? N -  Vision? N -  Difficulty concentrating or making decisions? Y -  Walking or climbing stairs? Y -  Dressing or bathing? N -  Doing errands, shopping? N Y  Conservation officer, nature and eating ? N -  Using the Toilet? N -  In the past six months, have you accidently leaked urine? Y -  Do you have problems with loss of bowel control? N -  Managing your Medications? N -  Managing your Finances? N -  Housekeeping or managing your Housekeeping? N -   Fall Risk  11/09/2015 10/11/2015 08/30/2015 08/02/2015 03/28/2015  Falls in the past year? Yes Yes Yes - Yes  Number falls in past yr: 2 or more 2 or more 2 or more 2 or more 2 or more  Injury with Fall? No Yes Yes Yes Yes  Risk Factor Category  High Fall Risk High Fall Risk High Fall Risk High Fall Risk High Fall Risk  Risk for fall due to : Impaired balance/gait;History of fall(s) History of fall(s);Impaired balance/gait History of fall(s);Impaired balance/gait History of fall(s);Impaired balance/gait History of fall(s);Impaired balance/gait  Risk for fall due to (comments): - - - - -  Follow up Falls prevention discussed Falls prevention discussed Falls evaluation completed;Education provided Falls prevention discussed Education provided;Falls prevention discussed   Fall/Depression Screening:    PHQ 2/9 Scores 10/11/2015 08/02/2015 03/28/2015 02/03/2015    PHQ - 2 Score 0 0 0 0    Assessment:  Routine home visit for case closure, Mrs.Rajkumar present during visit. Mr.Loden reports sleeping well during the night, wears his cpap at night.  Mr.Donica continues to have [place] visits 2 times per week.   Hypertension  Blood pressures have been stable ranging in the 120/70-80 per records Mrs.Canal keeps as she checks his blood pressures or readings from home health RN visits. Mrs.Chamblee continues to monitor patient salt intake,  limiting salt, choosing low salt food, and able to read labels to identify sodium content. Mrs.Demmer continues to manage patient medications . Mr.Abaya continues to weigh daily and record, patient takes prn metolazone for weight increases greater than 3 pounds, he has not required that on this week, today weight is 185, Mrs.Arenivas states weights range between 185 and 188. Mrs.Mehlberg denies shortness of breath or chest pain, he does have continues edema in left leg.Mr.Mcgurk keeps his nitroglycerin with him at all times and verbalizes how to use it.   Fall Mr.Ravert experienced a fall 2 weeks ago, she got up during the night to go to the bathroom without turning on the light, he did have his walker with him, no injury patient was able to get himself up from the floor. Reinforced fall precautions of turning on the extra light at night.  Leg wound Dressing present to wound, home health RN due to change today, and Mrs.Vogler changes it 5 days a week. Patient reports per home health RN and Dr.Morehead wound is healing slowly.    Care management goals have been met, no new needs identified by Mrs.Ribas on today, and agreeable to case closure. Mr. Philipson has home health RN visiting 2 times a week for wound care. Patient's eventual goal is to be able to attend cardiac rehab maintence phase after home health visits complete,patient unable to attend cardiac rehab while home health, Mrs.Coster will follow up with Dr.Scott after home  health sessions completed. Patient has consistent follow up with MD and Specialist Mrs.Knittle assist with monitoring weights and blood pressures and records, limiting salt in diet and understands the importance to notify PCP of concern , new symptoms, chest pain, weight gain.  Plan:  Will close case care management needs met Will send PCP a letter and patient   Joylene Draft, RN, [facility] [phone]- Mobile [phone]- [place]

## 2015-11-13 ENCOUNTER — Other Ambulatory Visit: Payer: Self-pay | Admitting: Cardiovascular Disease

## 2015-11-13 DIAGNOSIS — I480 Paroxysmal atrial fibrillation: Secondary | ICD-10-CM

## 2015-11-13 DIAGNOSIS — I2510 Atherosclerotic heart disease of native coronary artery without angina pectoris: Secondary | ICD-10-CM | POA: Diagnosis not present

## 2015-11-13 DIAGNOSIS — I70202 Unspecified atherosclerosis of native arteries of extremities, left leg: Secondary | ICD-10-CM | POA: Diagnosis not present

## 2015-11-13 DIAGNOSIS — I87332 Chronic venous hypertension (idiopathic) with ulcer and inflammation of left lower extremity: Secondary | ICD-10-CM | POA: Diagnosis not present

## 2015-11-13 DIAGNOSIS — I110 Hypertensive heart disease with heart failure: Secondary | ICD-10-CM | POA: Diagnosis not present

## 2015-11-13 DIAGNOSIS — L97322 Non-pressure chronic ulcer of left ankle with fat layer exposed: Secondary | ICD-10-CM | POA: Diagnosis not present

## 2015-11-13 DIAGNOSIS — I779 Disorder of arteries and arterioles, unspecified: Secondary | ICD-10-CM

## 2015-11-13 DIAGNOSIS — I509 Heart failure, unspecified: Secondary | ICD-10-CM | POA: Diagnosis not present

## 2015-11-13 DIAGNOSIS — I739 Peripheral vascular disease, unspecified: Secondary | ICD-10-CM

## 2015-11-16 DIAGNOSIS — I779 Disorder of arteries and arterioles, unspecified: Secondary | ICD-10-CM | POA: Diagnosis not present

## 2015-11-16 DIAGNOSIS — I110 Hypertensive heart disease with heart failure: Secondary | ICD-10-CM | POA: Diagnosis not present

## 2015-11-16 DIAGNOSIS — I2510 Atherosclerotic heart disease of native coronary artery without angina pectoris: Secondary | ICD-10-CM | POA: Diagnosis not present

## 2015-11-16 DIAGNOSIS — B351 Tinea unguium: Secondary | ICD-10-CM | POA: Diagnosis not present

## 2015-11-16 DIAGNOSIS — I87332 Chronic venous hypertension (idiopathic) with ulcer and inflammation of left lower extremity: Secondary | ICD-10-CM | POA: Diagnosis not present

## 2015-11-16 DIAGNOSIS — I509 Heart failure, unspecified: Secondary | ICD-10-CM | POA: Diagnosis not present

## 2015-11-16 DIAGNOSIS — L97322 Non-pressure chronic ulcer of left ankle with fat layer exposed: Secondary | ICD-10-CM | POA: Diagnosis not present

## 2015-11-16 DIAGNOSIS — I70202 Unspecified atherosclerosis of native arteries of extremities, left leg: Secondary | ICD-10-CM | POA: Diagnosis not present

## 2015-11-20 DIAGNOSIS — I87332 Chronic venous hypertension (idiopathic) with ulcer and inflammation of left lower extremity: Secondary | ICD-10-CM | POA: Diagnosis not present

## 2015-11-20 DIAGNOSIS — I2510 Atherosclerotic heart disease of native coronary artery without angina pectoris: Secondary | ICD-10-CM | POA: Diagnosis not present

## 2015-11-20 DIAGNOSIS — L97322 Non-pressure chronic ulcer of left ankle with fat layer exposed: Secondary | ICD-10-CM | POA: Diagnosis not present

## 2015-11-20 DIAGNOSIS — I70202 Unspecified atherosclerosis of native arteries of extremities, left leg: Secondary | ICD-10-CM | POA: Diagnosis not present

## 2015-11-20 DIAGNOSIS — I110 Hypertensive heart disease with heart failure: Secondary | ICD-10-CM | POA: Diagnosis not present

## 2015-11-20 DIAGNOSIS — I509 Heart failure, unspecified: Secondary | ICD-10-CM | POA: Diagnosis not present

## 2015-11-23 DIAGNOSIS — M25511 Pain in right shoulder: Secondary | ICD-10-CM | POA: Diagnosis not present

## 2015-11-23 DIAGNOSIS — G8929 Other chronic pain: Secondary | ICD-10-CM | POA: Diagnosis not present

## 2015-11-24 DIAGNOSIS — I87332 Chronic venous hypertension (idiopathic) with ulcer and inflammation of left lower extremity: Secondary | ICD-10-CM | POA: Diagnosis not present

## 2015-11-24 DIAGNOSIS — L97322 Non-pressure chronic ulcer of left ankle with fat layer exposed: Secondary | ICD-10-CM | POA: Diagnosis not present

## 2015-11-24 DIAGNOSIS — I2510 Atherosclerotic heart disease of native coronary artery without angina pectoris: Secondary | ICD-10-CM | POA: Diagnosis not present

## 2015-11-24 DIAGNOSIS — I509 Heart failure, unspecified: Secondary | ICD-10-CM | POA: Diagnosis not present

## 2015-11-24 DIAGNOSIS — I70202 Unspecified atherosclerosis of native arteries of extremities, left leg: Secondary | ICD-10-CM | POA: Diagnosis not present

## 2015-11-24 DIAGNOSIS — I110 Hypertensive heart disease with heart failure: Secondary | ICD-10-CM | POA: Diagnosis not present

## 2015-11-27 DIAGNOSIS — I87332 Chronic venous hypertension (idiopathic) with ulcer and inflammation of left lower extremity: Secondary | ICD-10-CM | POA: Diagnosis not present

## 2015-11-27 DIAGNOSIS — I110 Hypertensive heart disease with heart failure: Secondary | ICD-10-CM | POA: Diagnosis not present

## 2015-11-27 DIAGNOSIS — L97322 Non-pressure chronic ulcer of left ankle with fat layer exposed: Secondary | ICD-10-CM | POA: Diagnosis not present

## 2015-11-27 DIAGNOSIS — I70202 Unspecified atherosclerosis of native arteries of extremities, left leg: Secondary | ICD-10-CM | POA: Diagnosis not present

## 2015-11-27 DIAGNOSIS — I509 Heart failure, unspecified: Secondary | ICD-10-CM | POA: Diagnosis not present

## 2015-11-27 DIAGNOSIS — I2510 Atherosclerotic heart disease of native coronary artery without angina pectoris: Secondary | ICD-10-CM | POA: Diagnosis not present

## 2015-11-30 ENCOUNTER — Ambulatory Visit (HOSPITAL_COMMUNITY)
Admission: RE | Admit: 2015-11-30 | Discharge: 2015-11-30 | Disposition: A | Discharge status: Home / Self Care | Payer: Medicare Other | Source: Ambulatory Visit | Attending: Cardiology | Admitting: Cardiology

## 2015-11-30 DIAGNOSIS — I110 Hypertensive heart disease with heart failure: Secondary | ICD-10-CM | POA: Diagnosis not present

## 2015-11-30 DIAGNOSIS — R938 Abnormal findings on diagnostic imaging of other specified body structures: Secondary | ICD-10-CM | POA: Insufficient documentation

## 2015-11-30 DIAGNOSIS — I70202 Unspecified atherosclerosis of native arteries of extremities, left leg: Secondary | ICD-10-CM | POA: Diagnosis not present

## 2015-11-30 DIAGNOSIS — I480 Paroxysmal atrial fibrillation: Secondary | ICD-10-CM | POA: Insufficient documentation

## 2015-11-30 DIAGNOSIS — I779 Disorder of arteries and arterioles, unspecified: Secondary | ICD-10-CM | POA: Insufficient documentation

## 2015-11-30 DIAGNOSIS — L97322 Non-pressure chronic ulcer of left ankle with fat layer exposed: Secondary | ICD-10-CM | POA: Diagnosis not present

## 2015-11-30 DIAGNOSIS — I87332 Chronic venous hypertension (idiopathic) with ulcer and inflammation of left lower extremity: Secondary | ICD-10-CM | POA: Diagnosis not present

## 2015-11-30 DIAGNOSIS — I2510 Atherosclerotic heart disease of native coronary artery without angina pectoris: Secondary | ICD-10-CM | POA: Diagnosis not present

## 2015-11-30 DIAGNOSIS — E785 Hyperlipidemia, unspecified: Secondary | ICD-10-CM | POA: Diagnosis not present

## 2015-11-30 DIAGNOSIS — I739 Peripheral vascular disease, unspecified: Secondary | ICD-10-CM | POA: Diagnosis not present

## 2015-11-30 DIAGNOSIS — I509 Heart failure, unspecified: Secondary | ICD-10-CM | POA: Diagnosis not present

## 2015-12-04 DIAGNOSIS — H348112 Central retinal vein occlusion, right eye, stable: Secondary | ICD-10-CM | POA: Diagnosis not present

## 2015-12-04 DIAGNOSIS — H16421 Pannus (corneal), right eye: Secondary | ICD-10-CM | POA: Diagnosis not present

## 2015-12-04 DIAGNOSIS — H35371 Puckering of macula, right eye: Secondary | ICD-10-CM | POA: Diagnosis not present

## 2015-12-04 DIAGNOSIS — H11013 Amyloid pterygium of eye, bilateral: Secondary | ICD-10-CM | POA: Diagnosis not present

## 2015-12-05 DIAGNOSIS — I2510 Atherosclerotic heart disease of native coronary artery without angina pectoris: Secondary | ICD-10-CM | POA: Diagnosis not present

## 2015-12-05 DIAGNOSIS — L97322 Non-pressure chronic ulcer of left ankle with fat layer exposed: Secondary | ICD-10-CM | POA: Diagnosis not present

## 2015-12-05 DIAGNOSIS — I87332 Chronic venous hypertension (idiopathic) with ulcer and inflammation of left lower extremity: Secondary | ICD-10-CM | POA: Diagnosis not present

## 2015-12-05 DIAGNOSIS — I110 Hypertensive heart disease with heart failure: Secondary | ICD-10-CM | POA: Diagnosis not present

## 2015-12-05 DIAGNOSIS — I509 Heart failure, unspecified: Secondary | ICD-10-CM | POA: Diagnosis not present

## 2015-12-05 DIAGNOSIS — I70202 Unspecified atherosclerosis of native arteries of extremities, left leg: Secondary | ICD-10-CM | POA: Diagnosis not present

## 2015-12-07 DIAGNOSIS — N3281 Overactive bladder: Secondary | ICD-10-CM | POA: Diagnosis not present

## 2015-12-07 DIAGNOSIS — N401 Enlarged prostate with lower urinary tract symptoms: Secondary | ICD-10-CM | POA: Diagnosis not present

## 2015-12-08 DIAGNOSIS — I87332 Chronic venous hypertension (idiopathic) with ulcer and inflammation of left lower extremity: Secondary | ICD-10-CM | POA: Diagnosis not present

## 2015-12-08 DIAGNOSIS — I509 Heart failure, unspecified: Secondary | ICD-10-CM | POA: Diagnosis not present

## 2015-12-08 DIAGNOSIS — L97322 Non-pressure chronic ulcer of left ankle with fat layer exposed: Secondary | ICD-10-CM | POA: Diagnosis not present

## 2015-12-08 DIAGNOSIS — I110 Hypertensive heart disease with heart failure: Secondary | ICD-10-CM | POA: Diagnosis not present

## 2015-12-08 DIAGNOSIS — I2510 Atherosclerotic heart disease of native coronary artery without angina pectoris: Secondary | ICD-10-CM | POA: Diagnosis not present

## 2015-12-08 DIAGNOSIS — I70202 Unspecified atherosclerosis of native arteries of extremities, left leg: Secondary | ICD-10-CM | POA: Diagnosis not present

## 2015-12-11 DIAGNOSIS — I2510 Atherosclerotic heart disease of native coronary artery without angina pectoris: Secondary | ICD-10-CM | POA: Diagnosis not present

## 2015-12-11 DIAGNOSIS — I509 Heart failure, unspecified: Secondary | ICD-10-CM | POA: Diagnosis not present

## 2015-12-11 DIAGNOSIS — L97322 Non-pressure chronic ulcer of left ankle with fat layer exposed: Secondary | ICD-10-CM | POA: Diagnosis not present

## 2015-12-11 DIAGNOSIS — I87332 Chronic venous hypertension (idiopathic) with ulcer and inflammation of left lower extremity: Secondary | ICD-10-CM | POA: Diagnosis not present

## 2015-12-11 DIAGNOSIS — I70202 Unspecified atherosclerosis of native arteries of extremities, left leg: Secondary | ICD-10-CM | POA: Diagnosis not present

## 2015-12-11 DIAGNOSIS — I110 Hypertensive heart disease with heart failure: Secondary | ICD-10-CM | POA: Diagnosis not present

## 2015-12-14 DIAGNOSIS — I110 Hypertensive heart disease with heart failure: Secondary | ICD-10-CM | POA: Diagnosis not present

## 2015-12-14 DIAGNOSIS — I70202 Unspecified atherosclerosis of native arteries of extremities, left leg: Secondary | ICD-10-CM | POA: Diagnosis not present

## 2015-12-14 DIAGNOSIS — I87332 Chronic venous hypertension (idiopathic) with ulcer and inflammation of left lower extremity: Secondary | ICD-10-CM | POA: Diagnosis not present

## 2015-12-14 DIAGNOSIS — L97322 Non-pressure chronic ulcer of left ankle with fat layer exposed: Secondary | ICD-10-CM | POA: Diagnosis not present

## 2015-12-14 DIAGNOSIS — I2510 Atherosclerotic heart disease of native coronary artery without angina pectoris: Secondary | ICD-10-CM | POA: Diagnosis not present

## 2015-12-14 DIAGNOSIS — I509 Heart failure, unspecified: Secondary | ICD-10-CM | POA: Diagnosis not present

## 2015-12-15 DIAGNOSIS — J9811 Atelectasis: Secondary | ICD-10-CM | POA: Diagnosis not present

## 2015-12-15 DIAGNOSIS — K8020 Calculus of gallbladder without cholecystitis without obstruction: Secondary | ICD-10-CM | POA: Diagnosis not present

## 2015-12-15 DIAGNOSIS — Z882 Allergy status to sulfonamides status: Secondary | ICD-10-CM | POA: Diagnosis not present

## 2015-12-15 DIAGNOSIS — R079 Chest pain, unspecified: Secondary | ICD-10-CM | POA: Diagnosis not present

## 2015-12-15 DIAGNOSIS — Z885 Allergy status to narcotic agent status: Secondary | ICD-10-CM | POA: Diagnosis not present

## 2015-12-15 DIAGNOSIS — Z881 Allergy status to other antibiotic agents status: Secondary | ICD-10-CM | POA: Diagnosis not present

## 2015-12-15 DIAGNOSIS — Z888 Allergy status to other drugs, medicaments and biological substances status: Secondary | ICD-10-CM | POA: Diagnosis not present

## 2015-12-18 DIAGNOSIS — I509 Heart failure, unspecified: Secondary | ICD-10-CM | POA: Diagnosis not present

## 2015-12-18 DIAGNOSIS — I110 Hypertensive heart disease with heart failure: Secondary | ICD-10-CM | POA: Diagnosis not present

## 2015-12-18 DIAGNOSIS — L97322 Non-pressure chronic ulcer of left ankle with fat layer exposed: Secondary | ICD-10-CM | POA: Diagnosis not present

## 2015-12-18 DIAGNOSIS — I70202 Unspecified atherosclerosis of native arteries of extremities, left leg: Secondary | ICD-10-CM | POA: Diagnosis not present

## 2015-12-18 DIAGNOSIS — I87332 Chronic venous hypertension (idiopathic) with ulcer and inflammation of left lower extremity: Secondary | ICD-10-CM | POA: Diagnosis not present

## 2015-12-18 DIAGNOSIS — I2510 Atherosclerotic heart disease of native coronary artery without angina pectoris: Secondary | ICD-10-CM | POA: Diagnosis not present

## 2015-12-21 DIAGNOSIS — K8020 Calculus of gallbladder without cholecystitis without obstruction: Secondary | ICD-10-CM | POA: Diagnosis not present

## 2015-12-21 DIAGNOSIS — L97322 Non-pressure chronic ulcer of left ankle with fat layer exposed: Secondary | ICD-10-CM | POA: Diagnosis not present

## 2015-12-21 DIAGNOSIS — I110 Hypertensive heart disease with heart failure: Secondary | ICD-10-CM | POA: Diagnosis not present

## 2015-12-21 DIAGNOSIS — I509 Heart failure, unspecified: Secondary | ICD-10-CM | POA: Diagnosis not present

## 2015-12-21 DIAGNOSIS — I2510 Atherosclerotic heart disease of native coronary artery without angina pectoris: Secondary | ICD-10-CM | POA: Diagnosis not present

## 2015-12-21 DIAGNOSIS — I70202 Unspecified atherosclerosis of native arteries of extremities, left leg: Secondary | ICD-10-CM | POA: Diagnosis not present

## 2015-12-21 DIAGNOSIS — I87332 Chronic venous hypertension (idiopathic) with ulcer and inflammation of left lower extremity: Secondary | ICD-10-CM | POA: Diagnosis not present

## 2015-12-26 DIAGNOSIS — I70202 Unspecified atherosclerosis of native arteries of extremities, left leg: Secondary | ICD-10-CM | POA: Diagnosis not present

## 2015-12-26 DIAGNOSIS — R935 Abnormal findings on diagnostic imaging of other abdominal regions, including retroperitoneum: Secondary | ICD-10-CM | POA: Diagnosis not present

## 2015-12-26 DIAGNOSIS — I87332 Chronic venous hypertension (idiopathic) with ulcer and inflammation of left lower extremity: Secondary | ICD-10-CM | POA: Diagnosis not present

## 2015-12-26 DIAGNOSIS — I110 Hypertensive heart disease with heart failure: Secondary | ICD-10-CM | POA: Diagnosis not present

## 2015-12-26 DIAGNOSIS — Z0389 Encounter for observation for other suspected diseases and conditions ruled out: Secondary | ICD-10-CM | POA: Diagnosis not present

## 2015-12-26 DIAGNOSIS — I509 Heart failure, unspecified: Secondary | ICD-10-CM | POA: Diagnosis not present

## 2015-12-26 DIAGNOSIS — I2510 Atherosclerotic heart disease of native coronary artery without angina pectoris: Secondary | ICD-10-CM | POA: Diagnosis not present

## 2015-12-26 DIAGNOSIS — L97322 Non-pressure chronic ulcer of left ankle with fat layer exposed: Secondary | ICD-10-CM | POA: Diagnosis not present

## 2015-12-26 DIAGNOSIS — K8020 Calculus of gallbladder without cholecystitis without obstruction: Secondary | ICD-10-CM | POA: Diagnosis not present

## 2015-12-28 DIAGNOSIS — I110 Hypertensive heart disease with heart failure: Secondary | ICD-10-CM | POA: Diagnosis not present

## 2015-12-28 DIAGNOSIS — I70202 Unspecified atherosclerosis of native arteries of extremities, left leg: Secondary | ICD-10-CM | POA: Diagnosis not present

## 2015-12-28 DIAGNOSIS — I87332 Chronic venous hypertension (idiopathic) with ulcer and inflammation of left lower extremity: Secondary | ICD-10-CM | POA: Diagnosis not present

## 2015-12-28 DIAGNOSIS — I2510 Atherosclerotic heart disease of native coronary artery without angina pectoris: Secondary | ICD-10-CM | POA: Diagnosis not present

## 2015-12-28 DIAGNOSIS — I509 Heart failure, unspecified: Secondary | ICD-10-CM | POA: Diagnosis not present

## 2015-12-28 DIAGNOSIS — L97322 Non-pressure chronic ulcer of left ankle with fat layer exposed: Secondary | ICD-10-CM | POA: Diagnosis not present

## 2015-12-29 DIAGNOSIS — Z792 Long term (current) use of antibiotics: Secondary | ICD-10-CM | POA: Diagnosis not present

## 2015-12-29 DIAGNOSIS — I252 Old myocardial infarction: Secondary | ICD-10-CM | POA: Diagnosis not present

## 2015-12-29 DIAGNOSIS — Z7982 Long term (current) use of aspirin: Secondary | ICD-10-CM | POA: Diagnosis not present

## 2015-12-29 DIAGNOSIS — I87332 Chronic venous hypertension (idiopathic) with ulcer and inflammation of left lower extremity: Secondary | ICD-10-CM | POA: Diagnosis not present

## 2015-12-29 DIAGNOSIS — I5022 Chronic systolic (congestive) heart failure: Secondary | ICD-10-CM | POA: Diagnosis not present

## 2015-12-29 DIAGNOSIS — I70202 Unspecified atherosclerosis of native arteries of extremities, left leg: Secondary | ICD-10-CM | POA: Diagnosis not present

## 2015-12-29 DIAGNOSIS — F0781 Postconcussional syndrome: Secondary | ICD-10-CM | POA: Diagnosis not present

## 2015-12-29 DIAGNOSIS — I480 Paroxysmal atrial fibrillation: Secondary | ICD-10-CM | POA: Diagnosis not present

## 2015-12-29 DIAGNOSIS — F0390 Unspecified dementia without behavioral disturbance: Secondary | ICD-10-CM | POA: Diagnosis not present

## 2015-12-29 DIAGNOSIS — L97322 Non-pressure chronic ulcer of left ankle with fat layer exposed: Secondary | ICD-10-CM | POA: Diagnosis not present

## 2015-12-29 DIAGNOSIS — I509 Heart failure, unspecified: Secondary | ICD-10-CM | POA: Diagnosis not present

## 2015-12-29 DIAGNOSIS — F419 Anxiety disorder, unspecified: Secondary | ICD-10-CM | POA: Diagnosis not present

## 2015-12-29 DIAGNOSIS — I130 Hypertensive heart and chronic kidney disease with heart failure and stage 1 through stage 4 chronic kidney disease, or unspecified chronic kidney disease: Secondary | ICD-10-CM | POA: Diagnosis not present

## 2015-12-29 DIAGNOSIS — I2510 Atherosclerotic heart disease of native coronary artery without angina pectoris: Secondary | ICD-10-CM | POA: Diagnosis not present

## 2016-01-01 DIAGNOSIS — I5022 Chronic systolic (congestive) heart failure: Secondary | ICD-10-CM | POA: Diagnosis not present

## 2016-01-01 DIAGNOSIS — I2510 Atherosclerotic heart disease of native coronary artery without angina pectoris: Secondary | ICD-10-CM | POA: Diagnosis not present

## 2016-01-01 DIAGNOSIS — I130 Hypertensive heart and chronic kidney disease with heart failure and stage 1 through stage 4 chronic kidney disease, or unspecified chronic kidney disease: Secondary | ICD-10-CM | POA: Diagnosis not present

## 2016-01-01 DIAGNOSIS — L97322 Non-pressure chronic ulcer of left ankle with fat layer exposed: Secondary | ICD-10-CM | POA: Diagnosis not present

## 2016-01-01 DIAGNOSIS — I70202 Unspecified atherosclerosis of native arteries of extremities, left leg: Secondary | ICD-10-CM | POA: Diagnosis not present

## 2016-01-01 DIAGNOSIS — I87332 Chronic venous hypertension (idiopathic) with ulcer and inflammation of left lower extremity: Secondary | ICD-10-CM | POA: Diagnosis not present

## 2016-01-04 DIAGNOSIS — I87332 Chronic venous hypertension (idiopathic) with ulcer and inflammation of left lower extremity: Secondary | ICD-10-CM | POA: Diagnosis not present

## 2016-01-04 DIAGNOSIS — I5022 Chronic systolic (congestive) heart failure: Secondary | ICD-10-CM | POA: Diagnosis not present

## 2016-01-04 DIAGNOSIS — L97322 Non-pressure chronic ulcer of left ankle with fat layer exposed: Secondary | ICD-10-CM | POA: Diagnosis not present

## 2016-01-04 DIAGNOSIS — I130 Hypertensive heart and chronic kidney disease with heart failure and stage 1 through stage 4 chronic kidney disease, or unspecified chronic kidney disease: Secondary | ICD-10-CM | POA: Diagnosis not present

## 2016-01-04 DIAGNOSIS — I70202 Unspecified atherosclerosis of native arteries of extremities, left leg: Secondary | ICD-10-CM | POA: Diagnosis not present

## 2016-01-04 DIAGNOSIS — I2510 Atherosclerotic heart disease of native coronary artery without angina pectoris: Secondary | ICD-10-CM | POA: Diagnosis not present

## 2016-01-08 DIAGNOSIS — L97322 Non-pressure chronic ulcer of left ankle with fat layer exposed: Secondary | ICD-10-CM | POA: Diagnosis not present

## 2016-01-08 DIAGNOSIS — I5022 Chronic systolic (congestive) heart failure: Secondary | ICD-10-CM | POA: Diagnosis not present

## 2016-01-08 DIAGNOSIS — I2510 Atherosclerotic heart disease of native coronary artery without angina pectoris: Secondary | ICD-10-CM | POA: Diagnosis not present

## 2016-01-08 DIAGNOSIS — I87332 Chronic venous hypertension (idiopathic) with ulcer and inflammation of left lower extremity: Secondary | ICD-10-CM | POA: Diagnosis not present

## 2016-01-08 DIAGNOSIS — I70202 Unspecified atherosclerosis of native arteries of extremities, left leg: Secondary | ICD-10-CM | POA: Diagnosis not present

## 2016-01-08 DIAGNOSIS — I130 Hypertensive heart and chronic kidney disease with heart failure and stage 1 through stage 4 chronic kidney disease, or unspecified chronic kidney disease: Secondary | ICD-10-CM | POA: Diagnosis not present

## 2016-01-11 DIAGNOSIS — I2510 Atherosclerotic heart disease of native coronary artery without angina pectoris: Secondary | ICD-10-CM | POA: Diagnosis not present

## 2016-01-11 DIAGNOSIS — I5022 Chronic systolic (congestive) heart failure: Secondary | ICD-10-CM | POA: Diagnosis not present

## 2016-01-11 DIAGNOSIS — L97322 Non-pressure chronic ulcer of left ankle with fat layer exposed: Secondary | ICD-10-CM | POA: Diagnosis not present

## 2016-01-11 DIAGNOSIS — I70202 Unspecified atherosclerosis of native arteries of extremities, left leg: Secondary | ICD-10-CM | POA: Diagnosis not present

## 2016-01-11 DIAGNOSIS — I130 Hypertensive heart and chronic kidney disease with heart failure and stage 1 through stage 4 chronic kidney disease, or unspecified chronic kidney disease: Secondary | ICD-10-CM | POA: Diagnosis not present

## 2016-01-11 DIAGNOSIS — I87332 Chronic venous hypertension (idiopathic) with ulcer and inflammation of left lower extremity: Secondary | ICD-10-CM | POA: Diagnosis not present

## 2016-01-15 DIAGNOSIS — I130 Hypertensive heart and chronic kidney disease with heart failure and stage 1 through stage 4 chronic kidney disease, or unspecified chronic kidney disease: Secondary | ICD-10-CM | POA: Diagnosis not present

## 2016-01-15 DIAGNOSIS — I70202 Unspecified atherosclerosis of native arteries of extremities, left leg: Secondary | ICD-10-CM | POA: Diagnosis not present

## 2016-01-15 DIAGNOSIS — I87332 Chronic venous hypertension (idiopathic) with ulcer and inflammation of left lower extremity: Secondary | ICD-10-CM | POA: Diagnosis not present

## 2016-01-15 DIAGNOSIS — I2510 Atherosclerotic heart disease of native coronary artery without angina pectoris: Secondary | ICD-10-CM | POA: Diagnosis not present

## 2016-01-15 DIAGNOSIS — L97322 Non-pressure chronic ulcer of left ankle with fat layer exposed: Secondary | ICD-10-CM | POA: Diagnosis not present

## 2016-01-15 DIAGNOSIS — I5022 Chronic systolic (congestive) heart failure: Secondary | ICD-10-CM | POA: Diagnosis not present

## 2016-01-16 DIAGNOSIS — I87332 Chronic venous hypertension (idiopathic) with ulcer and inflammation of left lower extremity: Secondary | ICD-10-CM | POA: Diagnosis not present

## 2016-01-19 DIAGNOSIS — I70202 Unspecified atherosclerosis of native arteries of extremities, left leg: Secondary | ICD-10-CM | POA: Diagnosis not present

## 2016-01-19 DIAGNOSIS — I87332 Chronic venous hypertension (idiopathic) with ulcer and inflammation of left lower extremity: Secondary | ICD-10-CM | POA: Diagnosis not present

## 2016-01-19 DIAGNOSIS — I5022 Chronic systolic (congestive) heart failure: Secondary | ICD-10-CM | POA: Diagnosis not present

## 2016-01-19 DIAGNOSIS — I2510 Atherosclerotic heart disease of native coronary artery without angina pectoris: Secondary | ICD-10-CM | POA: Diagnosis not present

## 2016-01-19 DIAGNOSIS — I130 Hypertensive heart and chronic kidney disease with heart failure and stage 1 through stage 4 chronic kidney disease, or unspecified chronic kidney disease: Secondary | ICD-10-CM | POA: Diagnosis not present

## 2016-01-19 DIAGNOSIS — L97322 Non-pressure chronic ulcer of left ankle with fat layer exposed: Secondary | ICD-10-CM | POA: Diagnosis not present

## 2016-01-22 DIAGNOSIS — I70202 Unspecified atherosclerosis of native arteries of extremities, left leg: Secondary | ICD-10-CM | POA: Diagnosis not present

## 2016-01-22 DIAGNOSIS — L97322 Non-pressure chronic ulcer of left ankle with fat layer exposed: Secondary | ICD-10-CM | POA: Diagnosis not present

## 2016-01-22 DIAGNOSIS — I130 Hypertensive heart and chronic kidney disease with heart failure and stage 1 through stage 4 chronic kidney disease, or unspecified chronic kidney disease: Secondary | ICD-10-CM | POA: Diagnosis not present

## 2016-01-22 DIAGNOSIS — I2510 Atherosclerotic heart disease of native coronary artery without angina pectoris: Secondary | ICD-10-CM | POA: Diagnosis not present

## 2016-01-22 DIAGNOSIS — I87332 Chronic venous hypertension (idiopathic) with ulcer and inflammation of left lower extremity: Secondary | ICD-10-CM | POA: Diagnosis not present

## 2016-01-22 DIAGNOSIS — I5022 Chronic systolic (congestive) heart failure: Secondary | ICD-10-CM | POA: Diagnosis not present

## 2016-01-26 DIAGNOSIS — I87332 Chronic venous hypertension (idiopathic) with ulcer and inflammation of left lower extremity: Secondary | ICD-10-CM | POA: Diagnosis not present

## 2016-01-26 DIAGNOSIS — I130 Hypertensive heart and chronic kidney disease with heart failure and stage 1 through stage 4 chronic kidney disease, or unspecified chronic kidney disease: Secondary | ICD-10-CM | POA: Diagnosis not present

## 2016-01-26 DIAGNOSIS — L97322 Non-pressure chronic ulcer of left ankle with fat layer exposed: Secondary | ICD-10-CM | POA: Diagnosis not present

## 2016-01-26 DIAGNOSIS — I5022 Chronic systolic (congestive) heart failure: Secondary | ICD-10-CM | POA: Diagnosis not present

## 2016-01-26 DIAGNOSIS — I70202 Unspecified atherosclerosis of native arteries of extremities, left leg: Secondary | ICD-10-CM | POA: Diagnosis not present

## 2016-01-26 DIAGNOSIS — I2510 Atherosclerotic heart disease of native coronary artery without angina pectoris: Secondary | ICD-10-CM | POA: Diagnosis not present

## 2016-01-29 DIAGNOSIS — I70202 Unspecified atherosclerosis of native arteries of extremities, left leg: Secondary | ICD-10-CM | POA: Diagnosis not present

## 2016-01-29 DIAGNOSIS — I87332 Chronic venous hypertension (idiopathic) with ulcer and inflammation of left lower extremity: Secondary | ICD-10-CM | POA: Diagnosis not present

## 2016-01-29 DIAGNOSIS — L97322 Non-pressure chronic ulcer of left ankle with fat layer exposed: Secondary | ICD-10-CM | POA: Diagnosis not present

## 2016-01-29 DIAGNOSIS — I130 Hypertensive heart and chronic kidney disease with heart failure and stage 1 through stage 4 chronic kidney disease, or unspecified chronic kidney disease: Secondary | ICD-10-CM | POA: Diagnosis not present

## 2016-01-29 DIAGNOSIS — I5022 Chronic systolic (congestive) heart failure: Secondary | ICD-10-CM | POA: Diagnosis not present

## 2016-01-29 DIAGNOSIS — I2510 Atherosclerotic heart disease of native coronary artery without angina pectoris: Secondary | ICD-10-CM | POA: Diagnosis not present

## 2016-01-30 ENCOUNTER — Ambulatory Visit (INDEPENDENT_AMBULATORY_CARE_PROVIDER_SITE_OTHER): Payer: Medicare Other | Admitting: Cardiology

## 2016-01-30 ENCOUNTER — Encounter: Payer: Self-pay | Admitting: Cardiology

## 2016-01-30 VITALS — BP 110/54 | HR 58 | Ht 67.0 in | Wt 208.0 lb

## 2016-01-30 DIAGNOSIS — I779 Disorder of arteries and arterioles, unspecified: Secondary | ICD-10-CM

## 2016-01-30 DIAGNOSIS — I739 Peripheral vascular disease, unspecified: Secondary | ICD-10-CM | POA: Diagnosis not present

## 2016-01-30 DIAGNOSIS — I2510 Atherosclerotic heart disease of native coronary artery without angina pectoris: Secondary | ICD-10-CM

## 2016-01-30 DIAGNOSIS — N189 Chronic kidney disease, unspecified: Secondary | ICD-10-CM

## 2016-01-30 DIAGNOSIS — I480 Paroxysmal atrial fibrillation: Secondary | ICD-10-CM | POA: Diagnosis not present

## 2016-01-30 DIAGNOSIS — N183 Chronic kidney disease, stage 3 unspecified: Secondary | ICD-10-CM

## 2016-01-30 DIAGNOSIS — I10 Essential (primary) hypertension: Secondary | ICD-10-CM | POA: Diagnosis not present

## 2016-01-30 DIAGNOSIS — Z9861 Coronary angioplasty status: Secondary | ICD-10-CM

## 2016-01-30 NOTE — Assessment & Plan Note (Signed)
NSR, SB today 

## 2016-01-30 NOTE — Assessment & Plan Note (Signed)
GFR 46 

## 2016-01-30 NOTE — Assessment & Plan Note (Signed)
Multiple PCI's- no angina

## 2016-01-30 NOTE — Patient Instructions (Signed)
Your physician has recommended you make the following change in your medication...  -- DECREASE toprol to 50mg  in the morning and 25mg  at night  Your physician recommends that you schedule a follow-up appointment in: TWO MONTHS with Dr. [name]

## 2016-01-30 NOTE — Progress Notes (Signed)
01/30/2016 BEMNET CALTRIDER   March 09, 1929  QO:[id number]  Primary Physician Ann Held, MD Primary Cardiologist: Dr [name]  HPI:  82 y/o male followed by Dr [name] with a history of CAD, s/p multiple PCI's in the past. Last cath was Feb 2016, he had a PCI with DES to his RCA then. The LAD and CFX were patent then. His EF was 45-50%. He has a history of PAF- not on Coumadin secondary to a history of falls.  He saw Dr [name] in the office 11/08/15. The patient has had previous bilateral SFA stenting by Dr [name]. In Aug 2016 he had critical limb ischemia and arranged for him to undergo angiography. This was done 05/22/15 and revealed a CTO of the Lt SFA and P1 segment of the popliteal artery with one vessel runoff. The Rt SFA had a patent, previously placed, stent. He underwent successful PTA and restenting of a CTO of the Lt SFA then. Post intervention the pt developed confusion and went to SNF for a period. He saw Dr [name] in Feb and was stable. He has a HHRN come out and change his dressing. He denies chest pain or unusual dyspnea.    Current Outpatient Prescriptions  Medication Sig Dispense Refill  . acetaminophen (TYLENOL) 500 MG tablet Take 1,000 mg by mouth every 6 (six) hours as needed for moderate pain.    Marland Kitchen albuterol (PROVENTIL HFA;VENTOLIN HFA) 108 (90 BASE) MCG/ACT inhaler Inhale into the lungs every 6 (six) hours as needed for wheezing or shortness of breath. Reported on 11/09/2015    . amLODipine (NORVASC) 2.5 MG tablet 2.5 mg daily.  1  . aspirin EC 81 MG tablet Take 81 mg by mouth at bedtime.     Marland Kitchen atorvastatin (LIPITOR) 40 MG tablet Take 1 tablet (40 mg total) by mouth at bedtime. 31 tablet 11  . Cetirizine HCl (ZYRTEC ALLERGY) 10 MG CAPS Take by mouth.    . Cholecalciferol (VITAMIN D3) 2000 UNITS TABS Take 1 tablet by mouth.    . clopidogrel (PLAVIX) 75 MG tablet Take 1 tablet (75 mg total) by mouth daily. 30 tablet 11  . Cyanocobalamin (VITAMIN B 12 PO) Take 1 tablet by mouth  daily.    . DULoxetine (CYMBALTA) 60 MG capsule Take 60 mg by mouth daily.  0  . feeding supplement, ENSURE ENLIVE, (ENSURE ENLIVE) LIQD Take 237 mLs by mouth 2 (two) times daily between meals. 237 mL 12  . furosemide (LASIX) 20 MG tablet 20 mg daily.  0  . isosorbide mononitrate (IMDUR) 30 MG 24 hr tablet Take 30 mg by mouth 2 (two) times daily.   0  . Ketotifen Fumarate (ALAWAY OP) Place 1 drop into both eyes 2 (two) times daily as needed (dry eyes).    . Memantine HCl-Donepezil HCl (NAMZARIC) 28-10 MG CP24 Take 1 mg by mouth.    . metolazone (ZAROXOLYN) 2.5 MG tablet Take 2.5 mg by mouth every other day as needed (for fluid). Every other day    . metoprolol succinate (TOPROL-XL) 50 MG 24 hr tablet Take 1 tablet (50mg ) by mouth in the morning and 1/2 tablet (25mg ) by mouth in the evening.    . mirtazapine (REMERON) 15 MG tablet Take 15 mg by mouth at bedtime.    . Multiple Vitamin (MULTIVITAMIN WITH MINERALS) TABS tablet Take 1 tablet by mouth daily.    . nitroGLYCERIN (NITROSTAT) 0.4 MG SL tablet Place 0.4 mg under the tongue every 5 (five) minutes as needed for  chest pain.    Marland Kitchen oxybutynin (DITROPAN-XL) 10 MG 24 hr tablet Take 10 mg by mouth.    . pantoprazole (PROTONIX) 40 MG tablet take 1 tablet by mouth once daily 30 tablet 6  . potassium chloride SA (K-DUR,KLOR-CON) 20 MEQ tablet Take 1 tablet (20 mEq total) by mouth daily. 30 tablet 11  . RANEXA 500 MG 12 hr tablet take 1 tablet by mouth twice a day 60 tablet 6  . saccharomyces boulardii (FLORASTOR) 250 MG capsule Take 250 mg by mouth 2 (two) times daily.    Marland Kitchen SANTYL ointment Apply 1 application topically daily.  0  . tamsulosin (FLOMAX) 0.4 MG CAPS capsule Take 0.4 mg by mouth daily after supper.     No current facility-administered medications for this visit.    Allergies  Allergen Reactions  . Ciprofloxacin     unknown  . Ciprofloxacin Other (See Comments)    Unknown allergic reaction, possible shortness of breath  . Codeine       "knocked me out"  . Codeine Other (See Comments)    "knocked me out"  . Dexamethasone     unknown  . Dexamethasone Other (See Comments)    Unknown allergic reaction  . Sulfa Antibiotics Nausea And Vomiting  . Sulfonamide Derivatives     Unknown reaction  . Telmisartan     unknown  . Telmisartan Other (See Comments)    Micardis - heart races    Social History   Social History  . Marital Status: Married    Spouse Name: N/A  . Number of Children: N/A  . Years of Education: N/A   Occupational History  . 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 Smoking status: Former Smoker -- 0.75 packs/day for 20 years    Types: Cigarettes    Quit date: 09/23/1973  . Smokeless tobacco: Former Systems developer    Types: Snuff, Chew     Comment: tried snuff and chew as a child (38yr) did not continue  . Alcohol Use: Yes     Comment: 05/22/2015 "seldom-- every three or four years"  . Drug Use: No  . Sexual Activity: Not Currently   Other Topics Concern  . Not on file   Social History Narrative   ** Merged History Encounter **         Review of Systems: General: negative for chills, fever, night sweats or weight changes.  Cardiovascular: negative for chest pain, dyspnea on exertion, edema, orthopnea, palpitations, paroxysmal nocturnal dyspnea or shortness of breath Dermatological: negative for rash Respiratory: negative for cough or wheezing Urologic: negative for hematuria Abdominal: negative for nausea, vomiting, diarrhea, bright red blood per rectum, melena, or hematemesis Neurologic: negative for visual changes, syncope, or dizziness All other systems reviewed and are otherwise negative except as noted above.    Blood pressure 110/54, pulse 58, height 5\' 7"  (1.702 m), weight 208 lb (94.348 kg).  General appearance: alert, cooperative and no distress Neck: no JVD and RCA bruit Lungs: decreased, poor effort Heart: regular rate and rhythm Extremities:  His LLE is red and slightly war, this is unchanged according to the family. He is not tender. He has a wound on the medical aspect of his LLE that appears to be granulating in. There is no foul ordor to suggest infection.  Skin: pale, cool, dry Neurologic: Grossly normal  EKG NSR, SB-   ASSESSMENT AND PLAN:   CAD S/P multiple PCI's, last was RCA DES Feb 2016 Multiple  PCI's- no angina  PAD, history of stenting to both SFAs with Lt SFA PTA 05/22/15 Lt SFA PTA [id number] complicated by acute delirium, UTI, SNF placement Chronic ulcer LLE  PAF- CHADs VASc = 4. Not on anticoagulation secondary to hx of falls NSR, SB today  Chronic renal insufficiency, stage III (moderate) GFR 46   PLAN  I cut Mr Postle's Toprol to 50 mg in am, 25 mg in PM. F/U Dr [name] in 3 months.   [name] K PA-C 01/30/2016 3:17 PM

## 2016-01-30 NOTE — Assessment & Plan Note (Signed)
Lt SFA PTA [id number] complicated by acute delirium, UTI, SNF placement Chronic ulcer LLE

## 2016-02-01 DIAGNOSIS — I87332 Chronic venous hypertension (idiopathic) with ulcer and inflammation of left lower extremity: Secondary | ICD-10-CM | POA: Diagnosis not present

## 2016-02-01 DIAGNOSIS — L97322 Non-pressure chronic ulcer of left ankle with fat layer exposed: Secondary | ICD-10-CM | POA: Diagnosis not present

## 2016-02-01 DIAGNOSIS — I5022 Chronic systolic (congestive) heart failure: Secondary | ICD-10-CM | POA: Diagnosis not present

## 2016-02-01 DIAGNOSIS — N3281 Overactive bladder: Secondary | ICD-10-CM | POA: Diagnosis not present

## 2016-02-01 DIAGNOSIS — I70202 Unspecified atherosclerosis of native arteries of extremities, left leg: Secondary | ICD-10-CM | POA: Diagnosis not present

## 2016-02-01 DIAGNOSIS — N401 Enlarged prostate with lower urinary tract symptoms: Secondary | ICD-10-CM | POA: Diagnosis not present

## 2016-02-01 DIAGNOSIS — I2510 Atherosclerotic heart disease of native coronary artery without angina pectoris: Secondary | ICD-10-CM | POA: Diagnosis not present

## 2016-02-01 DIAGNOSIS — I130 Hypertensive heart and chronic kidney disease with heart failure and stage 1 through stage 4 chronic kidney disease, or unspecified chronic kidney disease: Secondary | ICD-10-CM | POA: Diagnosis not present

## 2016-02-05 DIAGNOSIS — I130 Hypertensive heart and chronic kidney disease with heart failure and stage 1 through stage 4 chronic kidney disease, or unspecified chronic kidney disease: Secondary | ICD-10-CM | POA: Diagnosis not present

## 2016-02-05 DIAGNOSIS — I5022 Chronic systolic (congestive) heart failure: Secondary | ICD-10-CM | POA: Diagnosis not present

## 2016-02-05 DIAGNOSIS — L97322 Non-pressure chronic ulcer of left ankle with fat layer exposed: Secondary | ICD-10-CM | POA: Diagnosis not present

## 2016-02-05 DIAGNOSIS — I87332 Chronic venous hypertension (idiopathic) with ulcer and inflammation of left lower extremity: Secondary | ICD-10-CM | POA: Diagnosis not present

## 2016-02-05 DIAGNOSIS — I70202 Unspecified atherosclerosis of native arteries of extremities, left leg: Secondary | ICD-10-CM | POA: Diagnosis not present

## 2016-02-05 DIAGNOSIS — I2510 Atherosclerotic heart disease of native coronary artery without angina pectoris: Secondary | ICD-10-CM | POA: Diagnosis not present

## 2016-02-08 ENCOUNTER — Ambulatory Visit (INDEPENDENT_AMBULATORY_CARE_PROVIDER_SITE_OTHER): Payer: Medicare Other | Admitting: Internal Medicine

## 2016-02-08 ENCOUNTER — Encounter: Payer: Self-pay | Admitting: Internal Medicine

## 2016-02-08 VITALS — BP 114/76 | HR 64 | Ht 67.0 in | Wt 208.0 lb

## 2016-02-08 DIAGNOSIS — I5022 Chronic systolic (congestive) heart failure: Secondary | ICD-10-CM | POA: Diagnosis not present

## 2016-02-08 DIAGNOSIS — I2510 Atherosclerotic heart disease of native coronary artery without angina pectoris: Secondary | ICD-10-CM | POA: Diagnosis not present

## 2016-02-08 DIAGNOSIS — L97322 Non-pressure chronic ulcer of left ankle with fat layer exposed: Secondary | ICD-10-CM | POA: Diagnosis not present

## 2016-02-08 DIAGNOSIS — I87332 Chronic venous hypertension (idiopathic) with ulcer and inflammation of left lower extremity: Secondary | ICD-10-CM | POA: Diagnosis not present

## 2016-02-08 DIAGNOSIS — I70202 Unspecified atherosclerosis of native arteries of extremities, left leg: Secondary | ICD-10-CM | POA: Diagnosis not present

## 2016-02-08 DIAGNOSIS — I130 Hypertensive heart and chronic kidney disease with heart failure and stage 1 through stage 4 chronic kidney disease, or unspecified chronic kidney disease: Secondary | ICD-10-CM | POA: Diagnosis not present

## 2016-02-08 DIAGNOSIS — G4733 Obstructive sleep apnea (adult) (pediatric): Secondary | ICD-10-CM

## 2016-02-08 NOTE — Progress Notes (Signed)
09/13/11 -82 year old male former smoker followed for obstructive sleep apnea, COPD, complicated by GERD, CAD, history of lung nodule LOV- 09/13/2010 PCP Dr Ann Held. Has had flu vaccine and shingles vaccine this year. Getting hyperbaric treatment for circulatory ischemia/wound right foot. He continues CPAP [facility] using a nasal pillows mask. He is able to use this all night every night. We discussed his history of COPD-he quit smoking many years ago. He denies any wheeze, cough, or shortness of breath with his routine activities, recognizing that exertion is limited. I asked about his lung nodule. He had a CT scan at [facility]. We will request that report.  09/11/12-82 year old male former smoker followed for obstructive sleep apnea, COPD, complicated by GERD, CAD, history of lung nodule  Wife is here FOLLOWS FOR: wears CPAP 9/ [facility] every night for about 6-7 hours each night and pressure working well for patient. No snoring through his mask. He continues cardiac rehabilitation 3 times per week and denies shortness of breath wheeze or cough. He is being evaluated for chronic left sided headaches. Workup for sinusitis was negative.  09/10/13- 82 year old male former smoker followed for obstructive sleep apnea, COPD, complicated by GERD, CAD, history of lung nodule  Wife is here FOLLOWS FOR: Breathing has been doing well. Currently using CPAP 9/ [facility]  machine every night. Has been falling asleep more often while sitting. Bedtime 11 PM to midnight, up between 9 and 10 AM. Sleeps through the night. Hallucinates about people and plants at night no sleepwalking and no kicking described.  12/09/13- 82 year old male former smoker followed for obstructive sleep apnea, COPD, complicated by GERD, CAD, history of lung nodule  Wife is here FOLLOWS FOR: needs order for chin strap sent to [facility]-has CPAP report attached- autotitration suggests  pressure 6-7, but too much leak.. Using CPAP 9/ [facility] Chronic headaches.  12/06/14- 82 year old male former smoker followed for obstructive sleep apnea, COPD, complicated by GERD, CAD/ CABG/PAFib/ coumadin, history of lung nodule  Wife is here FOLLOWS FOR: Wears CPAP 8 every night for about 7-8 hours; has order to fill out for new CPAP machine. DME is [facility]. Current machine is worn out. Recent MI 11/12/2014 with new stent. He was at [facility] ER recently for dyspnea which comes and goes. He doesn't recognize palpitations with this and denies significant cough wheeze or phlegm. He has albuterol rescue inhaler used occasionally, and a steroid nasal spray. He wasn't told of any significant findings from chest x-ray at ER.  Office Spirometry 12/06/2014-weak and inconsistent effort. Severe obstructive and restrictive defects. FVC 2.09/56%, FEV1 1.24/44%, FEV1/FVC 0.60, FEF 25-75 percent 0.31. Reduced forced vital capacity may reflect weak effort.  02/09/15- 82 year old male former smoker followed for obstructive sleep apnea, COPD, complicated by GERD, CAD/ CABG/PAFib/ coumadin, history of lung nodule  Wife is here Product manager for: WEARING CPAP 8/ [facility] EVERY NIGHT, NO PROBLEMS WITH MASK OR TUBING. RECIEVED A NEW MACHINE A FEW WEEKS AGO.PT.  HAVING RANDOM DEEP BREATHING , PT. FEELS LIKE HE HAS TO STOP AND CATCH HIS BREATH He thinks CPAP set at 9, is going too high- leaks Our record said on 8.  Occasionally feel pressure discomfort left lateral pectoral area- not pain, not exertional, no sweat or palpitation.  02/08/2016-82 year old male former smoker followed for OSA, COPD, complicated by GERD, CAD/CABG/PE A. fib/Coumadin, history lung nodule CPAP 7/[facility] FOLLOWS FOR: DME [facility]; Wears CPAP every night. DL attached.  CXR 02/09/2015 IMPRESSION: Mild chronic elevation of LEFT diaphragm. Emphysematous changes without acute  infiltrate. Electronically Signed  By: Lavonia Dana M.D.  On: 02/09/2015 17:00  ROS-see HPI Constitutional:   No-   weight loss, night sweats, fevers, chills, +fatigue, lassitude. HEENT:   + headaches, no- difficulty swallowing, tooth/dental problems, sore throat,       No-  sneezing, itching, ear ache, nasal congestion, post nasal drip,  CV:  No- acute  chest pain, orthopnea, PND, swelling in lower extremities, anasarca, dizziness, palpitations Resp: No- acute  shortness of breath with exertion or at rest.              No-   productive cough,  No non-productive cough,  No- coughing up of blood.              No-   change in color of mucus.  No- wheezing.   Skin: No-   rash or lesions. GI:  No-   heartburn, indigestion, abdominal pain, nausea, vomiting,  GU:  MS:  Neuro-     nothing unusual Psych:  No- change in mood or affect. No depression or anxiety.  No memory loss.  OBJ General- Alert, Oriented, Affect-appropriate, Distress- none acute. Overweight Skin- rash-none, lesions- none, excoriation- none Lymphadenopathy- none Head- atraumatic            Eyes- Gross vision intact, PERRLA, conjunctivae clear secretions            Ears- Hearing, canals-normal            Nose- Clear, no-Septal dev, mucus, polyps, erosion, perforation             Throat- Mallampati II-III , mucosa clear , drainage- none, tonsils- atrophic Neck- flexible , trachea midline, no stridor , thyroid nl, carotid no bruit Chest - symmetrical excursion , unlabored           Heart/CV- RRR at this visit , no murmur , no gallop  , no rub, nl s1 s2                           - JVD- none , edema- none, stasis changes- none, varices- none           Lung- +very diminished/ clear, unlabored,  wheeze- none, cough- none , dullness-none, rub- none           Chest wall-  Abd-  Br/ Gen/ Rectal- Not done, not indicated Extrem- cyanosis- none, clubbing, none, atrophy- none, strength- fair,+cane Neuro- grossly intact to  observation

## 2016-02-08 NOTE — Patient Instructions (Signed)
Order- DME [facility]- please help with mask style and fit for better seal and improved compliance   Please call as needed

## 2016-02-09 ENCOUNTER — Ambulatory Visit: Payer: Medicare Other | Admitting: Internal Medicine

## 2016-02-12 DIAGNOSIS — I87332 Chronic venous hypertension (idiopathic) with ulcer and inflammation of left lower extremity: Secondary | ICD-10-CM | POA: Diagnosis not present

## 2016-02-12 DIAGNOSIS — I2510 Atherosclerotic heart disease of native coronary artery without angina pectoris: Secondary | ICD-10-CM | POA: Diagnosis not present

## 2016-02-12 DIAGNOSIS — I5022 Chronic systolic (congestive) heart failure: Secondary | ICD-10-CM | POA: Diagnosis not present

## 2016-02-12 DIAGNOSIS — L97322 Non-pressure chronic ulcer of left ankle with fat layer exposed: Secondary | ICD-10-CM | POA: Diagnosis not present

## 2016-02-12 DIAGNOSIS — I70202 Unspecified atherosclerosis of native arteries of extremities, left leg: Secondary | ICD-10-CM | POA: Diagnosis not present

## 2016-02-12 DIAGNOSIS — I130 Hypertensive heart and chronic kidney disease with heart failure and stage 1 through stage 4 chronic kidney disease, or unspecified chronic kidney disease: Secondary | ICD-10-CM | POA: Diagnosis not present

## 2016-02-15 DIAGNOSIS — I5022 Chronic systolic (congestive) heart failure: Secondary | ICD-10-CM | POA: Diagnosis not present

## 2016-02-15 DIAGNOSIS — I87332 Chronic venous hypertension (idiopathic) with ulcer and inflammation of left lower extremity: Secondary | ICD-10-CM | POA: Diagnosis not present

## 2016-02-15 DIAGNOSIS — I130 Hypertensive heart and chronic kidney disease with heart failure and stage 1 through stage 4 chronic kidney disease, or unspecified chronic kidney disease: Secondary | ICD-10-CM | POA: Diagnosis not present

## 2016-02-15 DIAGNOSIS — I70202 Unspecified atherosclerosis of native arteries of extremities, left leg: Secondary | ICD-10-CM | POA: Diagnosis not present

## 2016-02-15 DIAGNOSIS — I2510 Atherosclerotic heart disease of native coronary artery without angina pectoris: Secondary | ICD-10-CM | POA: Diagnosis not present

## 2016-02-15 DIAGNOSIS — L97322 Non-pressure chronic ulcer of left ankle with fat layer exposed: Secondary | ICD-10-CM | POA: Diagnosis not present

## 2016-02-20 DIAGNOSIS — I130 Hypertensive heart and chronic kidney disease with heart failure and stage 1 through stage 4 chronic kidney disease, or unspecified chronic kidney disease: Secondary | ICD-10-CM | POA: Diagnosis not present

## 2016-02-20 DIAGNOSIS — I5022 Chronic systolic (congestive) heart failure: Secondary | ICD-10-CM | POA: Diagnosis not present

## 2016-02-20 DIAGNOSIS — L97322 Non-pressure chronic ulcer of left ankle with fat layer exposed: Secondary | ICD-10-CM | POA: Diagnosis not present

## 2016-02-20 DIAGNOSIS — I70202 Unspecified atherosclerosis of native arteries of extremities, left leg: Secondary | ICD-10-CM | POA: Diagnosis not present

## 2016-02-20 DIAGNOSIS — I2510 Atherosclerotic heart disease of native coronary artery without angina pectoris: Secondary | ICD-10-CM | POA: Diagnosis not present

## 2016-02-20 DIAGNOSIS — I87332 Chronic venous hypertension (idiopathic) with ulcer and inflammation of left lower extremity: Secondary | ICD-10-CM | POA: Diagnosis not present

## 2016-02-21 DIAGNOSIS — L97929 Non-pressure chronic ulcer of unspecified part of left lower leg with unspecified severity: Secondary | ICD-10-CM | POA: Diagnosis not present

## 2016-02-21 DIAGNOSIS — M79605 Pain in left leg: Secondary | ICD-10-CM | POA: Diagnosis not present

## 2016-02-22 ENCOUNTER — Telehealth: Payer: Self-pay | Admitting: Cardiovascular Disease

## 2016-02-22 DIAGNOSIS — I70202 Unspecified atherosclerosis of native arteries of extremities, left leg: Secondary | ICD-10-CM | POA: Diagnosis not present

## 2016-02-22 DIAGNOSIS — I130 Hypertensive heart and chronic kidney disease with heart failure and stage 1 through stage 4 chronic kidney disease, or unspecified chronic kidney disease: Secondary | ICD-10-CM | POA: Diagnosis not present

## 2016-02-22 DIAGNOSIS — I2510 Atherosclerotic heart disease of native coronary artery without angina pectoris: Secondary | ICD-10-CM | POA: Diagnosis not present

## 2016-02-22 DIAGNOSIS — L97322 Non-pressure chronic ulcer of left ankle with fat layer exposed: Secondary | ICD-10-CM | POA: Diagnosis not present

## 2016-02-22 DIAGNOSIS — I5022 Chronic systolic (congestive) heart failure: Secondary | ICD-10-CM | POA: Diagnosis not present

## 2016-02-22 DIAGNOSIS — I87332 Chronic venous hypertension (idiopathic) with ulcer and inflammation of left lower extremity: Secondary | ICD-10-CM | POA: Diagnosis not present

## 2016-02-22 NOTE — Telephone Encounter (Signed)
Returned call to pt's wife, ok per DPR. She said they went to urgent care yesterday because pt's was having increased pain at the site of his LLE ulcer. She says this pain has increased in intensity for the last week or so and was finally bad enough they went to urgent care. Pt was given hydrocodone and doxycycline at urgent care which the pt is taking. Home health comes twice a week for wound care and will be coming today. Reassured pt's wife to have them look at the wound to see if it is stable or if there's been a change and to have them call us if there's any problems. Wife says his swelling is the same as usual. Appt made for pt for 02/27/16.  Will route to Dr [name] for further recommendations if needed at this time.

## 2016-02-22 NOTE — Telephone Encounter (Signed)
New message   Wife is calling to tell rn  Pt went to [facility] Urgent [facility]   Pt has a Oceanographer

## 2016-02-26 DIAGNOSIS — R0602 Shortness of breath: Secondary | ICD-10-CM | POA: Diagnosis not present

## 2016-02-26 DIAGNOSIS — J189 Pneumonia, unspecified organism: Secondary | ICD-10-CM | POA: Diagnosis not present

## 2016-02-26 DIAGNOSIS — L089 Local infection of the skin and subcutaneous tissue, unspecified: Secondary | ICD-10-CM | POA: Diagnosis not present

## 2016-02-26 DIAGNOSIS — R05 Cough: Secondary | ICD-10-CM | POA: Diagnosis not present

## 2016-02-26 DIAGNOSIS — M7989 Other specified soft tissue disorders: Secondary | ICD-10-CM | POA: Diagnosis not present

## 2016-02-27 DIAGNOSIS — I5022 Chronic systolic (congestive) heart failure: Secondary | ICD-10-CM | POA: Diagnosis not present

## 2016-02-27 DIAGNOSIS — Z79899 Other long term (current) drug therapy: Secondary | ICD-10-CM | POA: Diagnosis not present

## 2016-02-27 DIAGNOSIS — L03116 Cellulitis of left lower limb: Secondary | ICD-10-CM | POA: Diagnosis present

## 2016-02-27 DIAGNOSIS — M25611 Stiffness of right shoulder, not elsewhere classified: Secondary | ICD-10-CM | POA: Diagnosis present

## 2016-02-27 DIAGNOSIS — R7989 Other specified abnormal findings of blood chemistry: Secondary | ICD-10-CM | POA: Diagnosis present

## 2016-02-27 DIAGNOSIS — Z9861 Coronary angioplasty status: Secondary | ICD-10-CM | POA: Diagnosis not present

## 2016-02-27 DIAGNOSIS — I83028 Varicose veins of left lower extremity with ulcer other part of lower leg: Secondary | ICD-10-CM | POA: Diagnosis present

## 2016-02-27 DIAGNOSIS — I2510 Atherosclerotic heart disease of native coronary artery without angina pectoris: Secondary | ICD-10-CM | POA: Diagnosis present

## 2016-02-27 DIAGNOSIS — I10 Essential (primary) hypertension: Secondary | ICD-10-CM | POA: Diagnosis not present

## 2016-02-27 DIAGNOSIS — M1990 Unspecified osteoarthritis, unspecified site: Secondary | ICD-10-CM | POA: Diagnosis present

## 2016-02-27 DIAGNOSIS — Z7982 Long term (current) use of aspirin: Secondary | ICD-10-CM | POA: Diagnosis not present

## 2016-02-27 DIAGNOSIS — I249 Acute ischemic heart disease, unspecified: Secondary | ICD-10-CM | POA: Diagnosis not present

## 2016-02-27 DIAGNOSIS — I252 Old myocardial infarction: Secondary | ICD-10-CM | POA: Diagnosis not present

## 2016-02-27 DIAGNOSIS — Z882 Allergy status to sulfonamides status: Secondary | ICD-10-CM | POA: Diagnosis not present

## 2016-02-27 DIAGNOSIS — J189 Pneumonia, unspecified organism: Secondary | ICD-10-CM | POA: Diagnosis not present

## 2016-02-27 DIAGNOSIS — T148 Other injury of unspecified body region: Secondary | ICD-10-CM | POA: Diagnosis not present

## 2016-02-27 DIAGNOSIS — Z66 Do not resuscitate: Secondary | ICD-10-CM | POA: Diagnosis present

## 2016-02-27 DIAGNOSIS — Z881 Allergy status to other antibiotic agents status: Secondary | ICD-10-CM | POA: Diagnosis not present

## 2016-02-27 DIAGNOSIS — M79606 Pain in leg, unspecified: Secondary | ICD-10-CM | POA: Diagnosis not present

## 2016-02-27 DIAGNOSIS — Z87891 Personal history of nicotine dependence: Secondary | ICD-10-CM | POA: Diagnosis not present

## 2016-02-27 DIAGNOSIS — G4733 Obstructive sleep apnea (adult) (pediatric): Secondary | ICD-10-CM | POA: Diagnosis present

## 2016-02-27 DIAGNOSIS — L97829 Non-pressure chronic ulcer of other part of left lower leg with unspecified severity: Secondary | ICD-10-CM | POA: Diagnosis present

## 2016-02-27 DIAGNOSIS — F0390 Unspecified dementia without behavioral disturbance: Secondary | ICD-10-CM | POA: Diagnosis not present

## 2016-02-28 ENCOUNTER — Ambulatory Visit: Payer: Medicare Other | Admitting: Cardiovascular Disease

## 2016-02-28 DIAGNOSIS — J189 Pneumonia, unspecified organism: Secondary | ICD-10-CM | POA: Diagnosis not present

## 2016-02-28 DIAGNOSIS — M25611 Stiffness of right shoulder, not elsewhere classified: Secondary | ICD-10-CM

## 2016-02-28 DIAGNOSIS — I5022 Chronic systolic (congestive) heart failure: Secondary | ICD-10-CM | POA: Diagnosis not present

## 2016-02-28 DIAGNOSIS — I249 Acute ischemic heart disease, unspecified: Secondary | ICD-10-CM

## 2016-02-28 DIAGNOSIS — R7989 Other specified abnormal findings of blood chemistry: Secondary | ICD-10-CM

## 2016-02-28 DIAGNOSIS — F0390 Unspecified dementia without behavioral disturbance: Secondary | ICD-10-CM

## 2016-02-28 NOTE — Telephone Encounter (Signed)
Spoke with Dr [name] and advised that patient needs an appt.  Attempted to call pt to scheduled an appt. No answer. Left message to call back and ask to have appt scheduled at next available with Dr [name].

## 2016-02-29 DIAGNOSIS — F0390 Unspecified dementia without behavioral disturbance: Secondary | ICD-10-CM | POA: Diagnosis not present

## 2016-02-29 DIAGNOSIS — I5022 Chronic systolic (congestive) heart failure: Secondary | ICD-10-CM | POA: Diagnosis not present

## 2016-02-29 DIAGNOSIS — I249 Acute ischemic heart disease, unspecified: Secondary | ICD-10-CM | POA: Diagnosis not present

## 2016-02-29 DIAGNOSIS — J189 Pneumonia, unspecified organism: Secondary | ICD-10-CM | POA: Diagnosis not present

## 2016-02-29 NOTE — Telephone Encounter (Signed)
Called and spoke with pt's wife, ok per DPR. She informed me that her husband was in the hospital right now for an infection in his foot. She states he is receiving IV antibiotics for a MRSA infection. Instructed her to keep appt already scheduled in July at this time. She verbalized understanding.

## 2016-03-01 DIAGNOSIS — J189 Pneumonia, unspecified organism: Secondary | ICD-10-CM | POA: Diagnosis not present

## 2016-03-01 DIAGNOSIS — I480 Paroxysmal atrial fibrillation: Secondary | ICD-10-CM | POA: Diagnosis not present

## 2016-03-01 DIAGNOSIS — Z7982 Long term (current) use of aspirin: Secondary | ICD-10-CM | POA: Diagnosis not present

## 2016-03-01 DIAGNOSIS — L97322 Non-pressure chronic ulcer of left ankle with fat layer exposed: Secondary | ICD-10-CM | POA: Diagnosis not present

## 2016-03-01 DIAGNOSIS — F0390 Unspecified dementia without behavioral disturbance: Secondary | ICD-10-CM | POA: Diagnosis not present

## 2016-03-01 DIAGNOSIS — I70202 Unspecified atherosclerosis of native arteries of extremities, left leg: Secondary | ICD-10-CM | POA: Diagnosis not present

## 2016-03-01 DIAGNOSIS — I2510 Atherosclerotic heart disease of native coronary artery without angina pectoris: Secondary | ICD-10-CM | POA: Diagnosis not present

## 2016-03-01 DIAGNOSIS — I130 Hypertensive heart and chronic kidney disease with heart failure and stage 1 through stage 4 chronic kidney disease, or unspecified chronic kidney disease: Secondary | ICD-10-CM | POA: Diagnosis not present

## 2016-03-01 DIAGNOSIS — Z792 Long term (current) use of antibiotics: Secondary | ICD-10-CM | POA: Diagnosis not present

## 2016-03-01 DIAGNOSIS — L03116 Cellulitis of left lower limb: Secondary | ICD-10-CM | POA: Diagnosis not present

## 2016-03-01 DIAGNOSIS — I252 Old myocardial infarction: Secondary | ICD-10-CM | POA: Diagnosis not present

## 2016-03-01 DIAGNOSIS — I87312 Chronic venous hypertension (idiopathic) with ulcer of left lower extremity: Secondary | ICD-10-CM | POA: Diagnosis not present

## 2016-03-01 DIAGNOSIS — F419 Anxiety disorder, unspecified: Secondary | ICD-10-CM | POA: Diagnosis not present

## 2016-03-01 DIAGNOSIS — I5022 Chronic systolic (congestive) heart failure: Secondary | ICD-10-CM | POA: Diagnosis not present

## 2016-03-01 DIAGNOSIS — N189 Chronic kidney disease, unspecified: Secondary | ICD-10-CM | POA: Diagnosis not present

## 2016-03-05 DIAGNOSIS — J189 Pneumonia, unspecified organism: Secondary | ICD-10-CM | POA: Diagnosis not present

## 2016-03-05 DIAGNOSIS — L97322 Non-pressure chronic ulcer of left ankle with fat layer exposed: Secondary | ICD-10-CM | POA: Diagnosis not present

## 2016-03-05 DIAGNOSIS — I87312 Chronic venous hypertension (idiopathic) with ulcer of left lower extremity: Secondary | ICD-10-CM | POA: Diagnosis not present

## 2016-03-05 DIAGNOSIS — L03116 Cellulitis of left lower limb: Secondary | ICD-10-CM | POA: Diagnosis not present

## 2016-03-05 DIAGNOSIS — I70202 Unspecified atherosclerosis of native arteries of extremities, left leg: Secondary | ICD-10-CM | POA: Diagnosis not present

## 2016-03-05 DIAGNOSIS — I130 Hypertensive heart and chronic kidney disease with heart failure and stage 1 through stage 4 chronic kidney disease, or unspecified chronic kidney disease: Secondary | ICD-10-CM | POA: Diagnosis not present

## 2016-03-07 DIAGNOSIS — S81802D Unspecified open wound, left lower leg, subsequent encounter: Secondary | ICD-10-CM | POA: Diagnosis not present

## 2016-03-08 DIAGNOSIS — L03116 Cellulitis of left lower limb: Secondary | ICD-10-CM | POA: Diagnosis not present

## 2016-03-08 DIAGNOSIS — J189 Pneumonia, unspecified organism: Secondary | ICD-10-CM | POA: Diagnosis not present

## 2016-03-08 DIAGNOSIS — I70202 Unspecified atherosclerosis of native arteries of extremities, left leg: Secondary | ICD-10-CM | POA: Diagnosis not present

## 2016-03-08 DIAGNOSIS — I87312 Chronic venous hypertension (idiopathic) with ulcer of left lower extremity: Secondary | ICD-10-CM | POA: Diagnosis not present

## 2016-03-08 DIAGNOSIS — L97322 Non-pressure chronic ulcer of left ankle with fat layer exposed: Secondary | ICD-10-CM | POA: Diagnosis not present

## 2016-03-08 DIAGNOSIS — I130 Hypertensive heart and chronic kidney disease with heart failure and stage 1 through stage 4 chronic kidney disease, or unspecified chronic kidney disease: Secondary | ICD-10-CM | POA: Diagnosis not present

## 2016-03-11 DIAGNOSIS — L97322 Non-pressure chronic ulcer of left ankle with fat layer exposed: Secondary | ICD-10-CM | POA: Diagnosis not present

## 2016-03-11 DIAGNOSIS — I70202 Unspecified atherosclerosis of native arteries of extremities, left leg: Secondary | ICD-10-CM | POA: Diagnosis not present

## 2016-03-11 DIAGNOSIS — I87312 Chronic venous hypertension (idiopathic) with ulcer of left lower extremity: Secondary | ICD-10-CM | POA: Diagnosis not present

## 2016-03-11 DIAGNOSIS — L03116 Cellulitis of left lower limb: Secondary | ICD-10-CM | POA: Diagnosis not present

## 2016-03-11 DIAGNOSIS — J189 Pneumonia, unspecified organism: Secondary | ICD-10-CM | POA: Diagnosis not present

## 2016-03-11 DIAGNOSIS — I130 Hypertensive heart and chronic kidney disease with heart failure and stage 1 through stage 4 chronic kidney disease, or unspecified chronic kidney disease: Secondary | ICD-10-CM | POA: Diagnosis not present

## 2016-03-12 DIAGNOSIS — L03116 Cellulitis of left lower limb: Secondary | ICD-10-CM | POA: Diagnosis not present

## 2016-03-12 DIAGNOSIS — I130 Hypertensive heart and chronic kidney disease with heart failure and stage 1 through stage 4 chronic kidney disease, or unspecified chronic kidney disease: Secondary | ICD-10-CM | POA: Diagnosis not present

## 2016-03-12 DIAGNOSIS — I87312 Chronic venous hypertension (idiopathic) with ulcer of left lower extremity: Secondary | ICD-10-CM | POA: Diagnosis not present

## 2016-03-12 DIAGNOSIS — I70202 Unspecified atherosclerosis of native arteries of extremities, left leg: Secondary | ICD-10-CM | POA: Diagnosis not present

## 2016-03-12 DIAGNOSIS — L97322 Non-pressure chronic ulcer of left ankle with fat layer exposed: Secondary | ICD-10-CM | POA: Diagnosis not present

## 2016-03-12 DIAGNOSIS — J189 Pneumonia, unspecified organism: Secondary | ICD-10-CM | POA: Diagnosis not present

## 2016-03-13 DIAGNOSIS — I87312 Chronic venous hypertension (idiopathic) with ulcer of left lower extremity: Secondary | ICD-10-CM | POA: Diagnosis not present

## 2016-03-13 DIAGNOSIS — I70202 Unspecified atherosclerosis of native arteries of extremities, left leg: Secondary | ICD-10-CM | POA: Diagnosis not present

## 2016-03-13 DIAGNOSIS — L97322 Non-pressure chronic ulcer of left ankle with fat layer exposed: Secondary | ICD-10-CM | POA: Diagnosis not present

## 2016-03-13 DIAGNOSIS — J189 Pneumonia, unspecified organism: Secondary | ICD-10-CM | POA: Diagnosis not present

## 2016-03-13 DIAGNOSIS — L03116 Cellulitis of left lower limb: Secondary | ICD-10-CM | POA: Diagnosis not present

## 2016-03-13 DIAGNOSIS — I130 Hypertensive heart and chronic kidney disease with heart failure and stage 1 through stage 4 chronic kidney disease, or unspecified chronic kidney disease: Secondary | ICD-10-CM | POA: Diagnosis not present

## 2016-03-14 DIAGNOSIS — I779 Disorder of arteries and arterioles, unspecified: Secondary | ICD-10-CM | POA: Diagnosis not present

## 2016-03-18 DIAGNOSIS — L97821 Non-pressure chronic ulcer of other part of left lower leg limited to breakdown of skin: Secondary | ICD-10-CM | POA: Diagnosis not present

## 2016-03-18 DIAGNOSIS — I87312 Chronic venous hypertension (idiopathic) with ulcer of left lower extremity: Secondary | ICD-10-CM | POA: Diagnosis not present

## 2016-03-18 DIAGNOSIS — I872 Venous insufficiency (chronic) (peripheral): Secondary | ICD-10-CM | POA: Diagnosis not present

## 2016-03-19 DIAGNOSIS — I70202 Unspecified atherosclerosis of native arteries of extremities, left leg: Secondary | ICD-10-CM | POA: Diagnosis not present

## 2016-03-19 DIAGNOSIS — L97322 Non-pressure chronic ulcer of left ankle with fat layer exposed: Secondary | ICD-10-CM | POA: Diagnosis not present

## 2016-03-19 DIAGNOSIS — L03116 Cellulitis of left lower limb: Secondary | ICD-10-CM | POA: Diagnosis not present

## 2016-03-19 DIAGNOSIS — J189 Pneumonia, unspecified organism: Secondary | ICD-10-CM | POA: Diagnosis not present

## 2016-03-19 DIAGNOSIS — I87312 Chronic venous hypertension (idiopathic) with ulcer of left lower extremity: Secondary | ICD-10-CM | POA: Diagnosis not present

## 2016-03-19 DIAGNOSIS — I130 Hypertensive heart and chronic kidney disease with heart failure and stage 1 through stage 4 chronic kidney disease, or unspecified chronic kidney disease: Secondary | ICD-10-CM | POA: Diagnosis not present

## 2016-03-21 DIAGNOSIS — I70202 Unspecified atherosclerosis of native arteries of extremities, left leg: Secondary | ICD-10-CM | POA: Diagnosis not present

## 2016-03-21 DIAGNOSIS — I130 Hypertensive heart and chronic kidney disease with heart failure and stage 1 through stage 4 chronic kidney disease, or unspecified chronic kidney disease: Secondary | ICD-10-CM | POA: Diagnosis not present

## 2016-03-21 DIAGNOSIS — L03116 Cellulitis of left lower limb: Secondary | ICD-10-CM | POA: Diagnosis not present

## 2016-03-21 DIAGNOSIS — I87312 Chronic venous hypertension (idiopathic) with ulcer of left lower extremity: Secondary | ICD-10-CM | POA: Diagnosis not present

## 2016-03-21 DIAGNOSIS — J189 Pneumonia, unspecified organism: Secondary | ICD-10-CM | POA: Diagnosis not present

## 2016-03-21 DIAGNOSIS — L97322 Non-pressure chronic ulcer of left ankle with fat layer exposed: Secondary | ICD-10-CM | POA: Diagnosis not present

## 2016-03-25 DIAGNOSIS — I70202 Unspecified atherosclerosis of native arteries of extremities, left leg: Secondary | ICD-10-CM | POA: Diagnosis not present

## 2016-03-25 DIAGNOSIS — I130 Hypertensive heart and chronic kidney disease with heart failure and stage 1 through stage 4 chronic kidney disease, or unspecified chronic kidney disease: Secondary | ICD-10-CM | POA: Diagnosis not present

## 2016-03-25 DIAGNOSIS — L03116 Cellulitis of left lower limb: Secondary | ICD-10-CM | POA: Diagnosis not present

## 2016-03-25 DIAGNOSIS — L97322 Non-pressure chronic ulcer of left ankle with fat layer exposed: Secondary | ICD-10-CM | POA: Diagnosis not present

## 2016-03-25 DIAGNOSIS — J189 Pneumonia, unspecified organism: Secondary | ICD-10-CM | POA: Diagnosis not present

## 2016-03-25 DIAGNOSIS — I87312 Chronic venous hypertension (idiopathic) with ulcer of left lower extremity: Secondary | ICD-10-CM | POA: Diagnosis not present

## 2016-03-28 DIAGNOSIS — L97821 Non-pressure chronic ulcer of other part of left lower leg limited to breakdown of skin: Secondary | ICD-10-CM | POA: Diagnosis not present

## 2016-03-28 DIAGNOSIS — I872 Venous insufficiency (chronic) (peripheral): Secondary | ICD-10-CM | POA: Diagnosis not present

## 2016-03-28 DIAGNOSIS — I87312 Chronic venous hypertension (idiopathic) with ulcer of left lower extremity: Secondary | ICD-10-CM | POA: Diagnosis not present

## 2016-03-29 DIAGNOSIS — L03116 Cellulitis of left lower limb: Secondary | ICD-10-CM | POA: Diagnosis not present

## 2016-03-29 DIAGNOSIS — I87312 Chronic venous hypertension (idiopathic) with ulcer of left lower extremity: Secondary | ICD-10-CM | POA: Diagnosis not present

## 2016-03-29 DIAGNOSIS — I70202 Unspecified atherosclerosis of native arteries of extremities, left leg: Secondary | ICD-10-CM | POA: Diagnosis not present

## 2016-03-29 DIAGNOSIS — I130 Hypertensive heart and chronic kidney disease with heart failure and stage 1 through stage 4 chronic kidney disease, or unspecified chronic kidney disease: Secondary | ICD-10-CM | POA: Diagnosis not present

## 2016-03-29 DIAGNOSIS — J189 Pneumonia, unspecified organism: Secondary | ICD-10-CM | POA: Diagnosis not present

## 2016-03-29 DIAGNOSIS — L97322 Non-pressure chronic ulcer of left ankle with fat layer exposed: Secondary | ICD-10-CM | POA: Diagnosis not present

## 2016-04-01 DIAGNOSIS — I872 Venous insufficiency (chronic) (peripheral): Secondary | ICD-10-CM | POA: Diagnosis not present

## 2016-04-01 DIAGNOSIS — I87312 Chronic venous hypertension (idiopathic) with ulcer of left lower extremity: Secondary | ICD-10-CM | POA: Diagnosis not present

## 2016-04-01 DIAGNOSIS — L97821 Non-pressure chronic ulcer of other part of left lower leg limited to breakdown of skin: Secondary | ICD-10-CM | POA: Diagnosis not present

## 2016-04-02 DIAGNOSIS — H532 Diplopia: Secondary | ICD-10-CM | POA: Diagnosis not present

## 2016-04-02 DIAGNOSIS — H26493 Other secondary cataract, bilateral: Secondary | ICD-10-CM | POA: Diagnosis not present

## 2016-04-03 DIAGNOSIS — I130 Hypertensive heart and chronic kidney disease with heart failure and stage 1 through stage 4 chronic kidney disease, or unspecified chronic kidney disease: Secondary | ICD-10-CM | POA: Diagnosis not present

## 2016-04-03 DIAGNOSIS — I87312 Chronic venous hypertension (idiopathic) with ulcer of left lower extremity: Secondary | ICD-10-CM | POA: Diagnosis not present

## 2016-04-03 DIAGNOSIS — J189 Pneumonia, unspecified organism: Secondary | ICD-10-CM | POA: Diagnosis not present

## 2016-04-03 DIAGNOSIS — I70202 Unspecified atherosclerosis of native arteries of extremities, left leg: Secondary | ICD-10-CM | POA: Diagnosis not present

## 2016-04-03 DIAGNOSIS — L03116 Cellulitis of left lower limb: Secondary | ICD-10-CM | POA: Diagnosis not present

## 2016-04-03 DIAGNOSIS — L97322 Non-pressure chronic ulcer of left ankle with fat layer exposed: Secondary | ICD-10-CM | POA: Diagnosis not present

## 2016-04-08 DIAGNOSIS — L97821 Non-pressure chronic ulcer of other part of left lower leg limited to breakdown of skin: Secondary | ICD-10-CM | POA: Diagnosis not present

## 2016-04-08 DIAGNOSIS — L97222 Non-pressure chronic ulcer of left calf with fat layer exposed: Secondary | ICD-10-CM | POA: Diagnosis not present

## 2016-04-08 DIAGNOSIS — I87312 Chronic venous hypertension (idiopathic) with ulcer of left lower extremity: Secondary | ICD-10-CM | POA: Diagnosis not present

## 2016-04-08 DIAGNOSIS — I872 Venous insufficiency (chronic) (peripheral): Secondary | ICD-10-CM | POA: Diagnosis not present

## 2016-04-09 ENCOUNTER — Ambulatory Visit: Payer: Medicare Other | Admitting: Cardiovascular Disease

## 2016-04-10 DIAGNOSIS — I70203 Unspecified atherosclerosis of native arteries of extremities, bilateral legs: Secondary | ICD-10-CM | POA: Diagnosis not present

## 2016-04-10 DIAGNOSIS — L97222 Non-pressure chronic ulcer of left calf with fat layer exposed: Secondary | ICD-10-CM | POA: Diagnosis not present

## 2016-04-11 DIAGNOSIS — L97322 Non-pressure chronic ulcer of left ankle with fat layer exposed: Secondary | ICD-10-CM | POA: Diagnosis not present

## 2016-04-11 DIAGNOSIS — J189 Pneumonia, unspecified organism: Secondary | ICD-10-CM | POA: Diagnosis not present

## 2016-04-11 DIAGNOSIS — I87312 Chronic venous hypertension (idiopathic) with ulcer of left lower extremity: Secondary | ICD-10-CM | POA: Diagnosis not present

## 2016-04-11 DIAGNOSIS — I70202 Unspecified atherosclerosis of native arteries of extremities, left leg: Secondary | ICD-10-CM | POA: Diagnosis not present

## 2016-04-11 DIAGNOSIS — I130 Hypertensive heart and chronic kidney disease with heart failure and stage 1 through stage 4 chronic kidney disease, or unspecified chronic kidney disease: Secondary | ICD-10-CM | POA: Diagnosis not present

## 2016-04-11 DIAGNOSIS — L03116 Cellulitis of left lower limb: Secondary | ICD-10-CM | POA: Diagnosis not present

## 2016-04-15 DIAGNOSIS — L97821 Non-pressure chronic ulcer of other part of left lower leg limited to breakdown of skin: Secondary | ICD-10-CM | POA: Diagnosis not present

## 2016-04-15 DIAGNOSIS — I87312 Chronic venous hypertension (idiopathic) with ulcer of left lower extremity: Secondary | ICD-10-CM | POA: Diagnosis not present

## 2016-04-15 DIAGNOSIS — I872 Venous insufficiency (chronic) (peripheral): Secondary | ICD-10-CM | POA: Diagnosis not present

## 2016-04-16 ENCOUNTER — Encounter: Payer: Self-pay | Admitting: Cardiovascular Disease

## 2016-04-16 ENCOUNTER — Ambulatory Visit (INDEPENDENT_AMBULATORY_CARE_PROVIDER_SITE_OTHER): Payer: Medicare Other | Admitting: Cardiovascular Disease

## 2016-04-16 DIAGNOSIS — I739 Peripheral vascular disease, unspecified: Secondary | ICD-10-CM | POA: Diagnosis not present

## 2016-04-16 DIAGNOSIS — Z9861 Coronary angioplasty status: Secondary | ICD-10-CM

## 2016-04-16 DIAGNOSIS — I779 Disorder of arteries and arterioles, unspecified: Secondary | ICD-10-CM | POA: Diagnosis not present

## 2016-04-16 DIAGNOSIS — E785 Hyperlipidemia, unspecified: Secondary | ICD-10-CM

## 2016-04-16 DIAGNOSIS — I480 Paroxysmal atrial fibrillation: Secondary | ICD-10-CM | POA: Diagnosis not present

## 2016-04-16 DIAGNOSIS — I2510 Atherosclerotic heart disease of native coronary artery without angina pectoris: Secondary | ICD-10-CM

## 2016-04-16 DIAGNOSIS — I10 Essential (primary) hypertension: Secondary | ICD-10-CM

## 2016-04-16 NOTE — Patient Instructions (Signed)
Medication Instructions:  Your physician recommends that you continue on your current medications as directed. Please refer to the Current Medication list given to you today.   Testing/Procedures: Your physician has requested that you have a lower extremity arterial doppler- During this test, ultrasound is used to evaluate arterial blood flow in the legs. Allow approximately one hour for this exam. IN 6 MONTHS  Follow-Up: We request that you follow-up in: 6 MONTHS with an extender and in 12 MONTHS with Dr Andria Rhein will receive a reminder letter in the mail two months in advance. If you don't receive a letter, please call our office to schedule the follow-up appointment.   If you need a refill on your cardiac medications before your next appointment, please call your pharmacy.

## 2016-04-16 NOTE — Assessment & Plan Note (Signed)
History of peripheral arterial disease status post bilateral SFA intervention. Because of critical limb ischemia he underwent angiography by myself 05/22/15 with intervention of his distal left SFA. This improved his circulation, his Dopplers and promoted wound healing. Recent Dopplers performed in aspirin L7/19/17 revealed ABIs in the mid 0.9 range bilaterally

## 2016-04-16 NOTE — Assessment & Plan Note (Signed)
History of CAD status post non-STEMI to PCI and stenting of high-grade RCA stenosis by Dr. Burt Knack. He does have a known totally occluded LAD by cath back in July 2010. He denies chest pain but is chronically short of breath.

## 2016-04-16 NOTE — Assessment & Plan Note (Signed)
History of hyperlipidemia on statin therapy followed by his PCP 

## 2016-04-16 NOTE — Assessment & Plan Note (Signed)
History of hypertension blood pressure measured 136/65. He is on amlodipine, and metoprolol. Continue current meds at current dosing

## 2016-04-16 NOTE — Assessment & Plan Note (Signed)
History of PAF not on oral anticoagulation because of a history of falls. The CHA2DSVASC2 score is 4  .

## 2016-04-16 NOTE — Progress Notes (Signed)
04/16/2016 Gavin [name]   [birth year]  [id number]  Primary Physician Gavin Held, MD Primary Cardiologist: Gavin Harp MD Gavin [name], Gavin [name]  HPI:  75-old white married male who is accompanied by his wife today. I last saw him in the office 11/08/15.  He has a history of coronary disease with last cardiac catheterization July 2010 revealing an occluded LAD stent otherwise normal coronary arteries. At that time his EF was 30%, subsequent 2-D echo in 2013 revealed EF is normal. He had a stress test in 2012 with mod to severe perfusion defect, maybe slightly worse than previous study.At that time the pt was without symptoms so no further work up was done. He also has a history of PAF and is on Coumadin for anticoagulation. He has been maintaining sinus rhythm. He has peripheral vascular disease with stenting by Dr. [name] to both SFAs in 2004 with 0 vessel runoff on the right and one on the left via peroneal. he also has bilateral carotid disease left greater than right. Other problems include hypertension hyperlipidemia, and obstructive sleep apnea on CPAP. He has chronic headache with neg. head CT in June, and recently per our office stopped his IMDUR to see if Headache would resolve. It did not. He resumed the Imdur 05/29/13. He was admitted 05/29/13 with recurrent chest pain worrisome for unstable angina. His Coumadin was [name] His Troponin was negative. He underwent coronary angiogram 06/01/13. This revealed moderate RCA, ostial CFX, and distal Dx1 disease with know distal LAD occlusion. There was no obvious culprit lesion and the plan is for medical Rx. Norvasc and Ranexa were added and his Imdur was decreased.he was seen by Gavin [name] [facility] in the office 06/08/13 for post consultation followup. . I last saw him in the office earlier this year. He has paroxysmal atrial fibrillation on Coumadin anticoagulation. He has fallen several times one involving head trauma.  Since I saw him in the office  one year ago he was admitted back in February for right shoulder pain which ended up being a non-STEMI. He was catheterized by Dr. Burt [name] revealing 80% mid dominant RCA stenosis which was stented using a drug-eluting stent. He is placed on Brilenta. Since that time he noticed increasing shortness of breath. Since I saw him in the office 2 months ago he developed a nonhealing wound on the medial aspect of his left calf. Dopplers revealed decrease in his ABI from 0.86 on the left with a patent SFA to non-measurable because of pain and an occluded left SFA. I did transition him from related to Plavix because of shortness of breath. Because of critical limb ischemia and a nonhealing ulcer on his left leg he underwent angiography by myself 05/22/15 with recent stenting of an occluded distal left SFA. His follow-up Dopplers were markedly improved and his wound slowly healing under the auspices of a Garment/textile technologist in [place]. Since I saw him 6 months ago he continues to see wound care and [place] with slow improvement in his left pretibial ischemic ulcer. Recent Dopplers performed in [place] 04/10/16 revealed ABIs in the 0.9 range bilaterally.    Current Outpatient Prescriptions  Medication Sig Dispense Refill  . acetaminophen (TYLENOL) 500 MG tablet Take 1,000 mg by mouth every 6 (six) hours as needed for moderate pain.    Gavin [name] albuterol (PROVENTIL HFA;VENTOLIN HFA) 108 (90 BASE) MCG/ACT inhaler Inhale into the lungs every 6 (six) hours as needed for wheezing or shortness of breath. Reported on 11/09/2015    .  amLODipine (NORVASC) 2.5 MG tablet 2.5 mg daily.  1  . aspirin EC 81 MG tablet Take 81 mg by mouth at bedtime.     Gavin [name] atorvastatin (LIPITOR) 40 MG tablet Take 1 tablet (40 mg total) by mouth at bedtime. 31 tablet 11  . Cetirizine HCl (ZYRTEC ALLERGY) 10 MG CAPS Take by mouth.    . Cholecalciferol (VITAMIN D3) 2000 UNITS TABS Take 1 tablet by mouth.    . clopidogrel (PLAVIX) 75 MG tablet Take 1 tablet (75 mg  total) by mouth daily. 30 tablet 11  . Cyanocobalamin (VITAMIN B 12 PO) Take 1 tablet by mouth daily.    Gavin [name] Calcium (STOOL SOFTENER PO) Take by mouth.    . DULoxetine (CYMBALTA) 60 MG capsule Take 60 mg by mouth daily.  0  . furosemide (LASIX) 20 MG tablet 20 mg daily.  0  . isosorbide mononitrate (IMDUR) 30 MG 24 hr tablet Take 30 mg by mouth 2 (two) times daily.   0  . Ketotifen Fumarate (ALAWAY OP) Place 1 drop into both eyes 2 (two) times daily as needed (dry eyes).    . lactose free nutrition (BOOST) LIQD Take 237 mLs by mouth daily.    . Memantine HCl-Donepezil HCl (NAMZARIC) 28-10 MG CP24 Take 1 mg by mouth.    . metolazone (ZAROXOLYN) 2.5 MG tablet Take 2.5 mg by mouth every other day as needed (for fluid). Every other day    . metoprolol succinate (TOPROL-XL) 50 MG 24 hr tablet Take 1 tablet (50mg ) by mouth in the morning and 1/2 tablet (25mg ) by mouth in the evening.    . mirtazapine (REMERON) 15 MG tablet Take 15 mg by mouth at bedtime.    . Multiple Vitamin (MULTIVITAMIN WITH MINERALS) TABS tablet Take 1 tablet by mouth daily.    . nitroGLYCERIN (NITROSTAT) 0.4 MG SL tablet Place 0.4 mg under the tongue every 5 (five) minutes as needed for chest pain.    Gavin [name] oxybutynin (DITROPAN-XL) 10 MG 24 hr tablet Take 2 tablets at noon and 1 tablet at bedtime    . pantoprazole (PROTONIX) 40 MG tablet take 1 tablet by mouth once daily 30 tablet 6  . potassium chloride SA (K-DUR,KLOR-CON) 20 MEQ tablet Take 1 tablet (20 mEq total) by mouth daily. 30 tablet 11  . RANEXA 500 MG 12 hr tablet take 1 tablet by mouth twice a day 60 tablet 6  . tamsulosin (FLOMAX) 0.4 MG CAPS capsule Take 0.4 mg by mouth daily after supper.     No current facility-administered medications for this visit.     Allergies  Allergen Reactions  . Ciprofloxacin     unknown  . Ciprofloxacin Other (See Comments)    Unknown allergic reaction, possible shortness of breath  . Codeine     "knocked me out"  . Codeine  Other (See Comments)    "knocked me out"  . Dexamethasone     unknown  . Dexamethasone Other (See Comments)    Unknown allergic reaction  . Sulfa Antibiotics Nausea And Vomiting  . Sulfonamide Derivatives     Unknown reaction  . Telmisartan     unknown  . Telmisartan Other (See Comments)    Micardis - heart races    Social History   Social History  . Marital status: Married    Spouse name: N/A  . Number of children: N/A  . Years of education: N/A   Occupational History  . 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 Smoking status: Former Smoker    Packs/day: 0.75    Years: 20.00    Types: Cigarettes    Quit date: 09/23/1973  . Smokeless tobacco: Former Systems developer    Types: Snuff, Chew     Comment: tried snuff and chew as a child (69yr) did not continue  . Alcohol use Yes     Comment: 05/22/2015 "seldom-- every three or four years"  . Drug use: No  . Sexual activity: Not Currently   Other Topics Concern  . Not on file   Social History Narrative   ** Merged History Encounter **         Review of Systems: General: negative for chills, fever, night sweats or weight changes.  Cardiovascular: negative for chest pain, dyspnea on exertion, edema, orthopnea, palpitations, paroxysmal nocturnal dyspnea or shortness of breath Dermatological: negative for rash Respiratory: negative for cough or wheezing Urologic: negative for hematuria Abdominal: negative for nausea, vomiting, diarrhea, bright red blood per rectum, melena, or hematemesis Neurologic: negative for visual changes, syncope, or dizziness All other systems reviewed and are otherwise negative except as noted above.    Blood pressure 136/65, pulse 74, height 5\' 7"  (1.702 m), weight 204 lb 3.2 oz (92.6 kg).  General appearance: alert and no distress Neck: no adenopathy, no carotid bruit, no JVD, supple, symmetrical, trachea midline and thyroid not enlarged, symmetric, no  tenderness/mass/nodules Lungs: clear to auscultation bilaterally Heart: regular rate and rhythm, S1, S2 normal, no murmur, click, rub or gallop Extremities: extremities normal, atraumatic, no cyanosis or edema and The left lower leg is wrapped.  EKG not performed today  ASSESSMENT AND PLAN:   PAF- CHADs VASc = 4. Not on anticoagulation secondary to hx of falls History of PAF not on oral anticoagulation because of a history of falls. The CHA2DSVASC2 score is 4  .  PAD, history of stenting to both SFAs with Lt SFA PTA 05/22/15 History of peripheral arterial disease status post bilateral SFA intervention. Because of critical limb ischemia he underwent angiography by myself 05/22/15 with intervention of his distal left SFA. This improved his circulation, his Dopplers and promoted wound healing. Recent Dopplers performed in [place] L7/19/17 revealed ABIs in the mid 0.9 range bilaterally  HLD (hyperlipidemia) History of hyperlipidemia on statin therapy followed by his PCP  CAD S/P multiple PCI's, last was RCA DES Feb 2016 History of CAD status post non-STEMI to PCI and stenting of high-grade RCA stenosis by Dr. Burt [name] He does have a known totally occluded LAD by cath back in July 2010. He denies chest pain but is chronically short of breath.  Essential hypertension History of hypertension blood pressure measured 136/65. He is on amlodipine, and metoprolol. Continue current meds at current dosing      Gavin Harp MD [facility], [facility] 04/16/2016 10:43 AM

## 2016-04-17 DIAGNOSIS — L97322 Non-pressure chronic ulcer of left ankle with fat layer exposed: Secondary | ICD-10-CM | POA: Diagnosis not present

## 2016-04-17 DIAGNOSIS — I70202 Unspecified atherosclerosis of native arteries of extremities, left leg: Secondary | ICD-10-CM | POA: Diagnosis not present

## 2016-04-17 DIAGNOSIS — I130 Hypertensive heart and chronic kidney disease with heart failure and stage 1 through stage 4 chronic kidney disease, or unspecified chronic kidney disease: Secondary | ICD-10-CM | POA: Diagnosis not present

## 2016-04-17 DIAGNOSIS — L03116 Cellulitis of left lower limb: Secondary | ICD-10-CM | POA: Diagnosis not present

## 2016-04-17 DIAGNOSIS — J189 Pneumonia, unspecified organism: Secondary | ICD-10-CM | POA: Diagnosis not present

## 2016-04-17 DIAGNOSIS — I87312 Chronic venous hypertension (idiopathic) with ulcer of left lower extremity: Secondary | ICD-10-CM | POA: Diagnosis not present

## 2016-04-19 DIAGNOSIS — L97322 Non-pressure chronic ulcer of left ankle with fat layer exposed: Secondary | ICD-10-CM | POA: Diagnosis not present

## 2016-04-19 DIAGNOSIS — J189 Pneumonia, unspecified organism: Secondary | ICD-10-CM | POA: Diagnosis not present

## 2016-04-19 DIAGNOSIS — I70202 Unspecified atherosclerosis of native arteries of extremities, left leg: Secondary | ICD-10-CM | POA: Diagnosis not present

## 2016-04-19 DIAGNOSIS — I87312 Chronic venous hypertension (idiopathic) with ulcer of left lower extremity: Secondary | ICD-10-CM | POA: Diagnosis not present

## 2016-04-19 DIAGNOSIS — I130 Hypertensive heart and chronic kidney disease with heart failure and stage 1 through stage 4 chronic kidney disease, or unspecified chronic kidney disease: Secondary | ICD-10-CM | POA: Diagnosis not present

## 2016-04-19 DIAGNOSIS — L03116 Cellulitis of left lower limb: Secondary | ICD-10-CM | POA: Diagnosis not present

## 2016-04-22 DIAGNOSIS — L97821 Non-pressure chronic ulcer of other part of left lower leg limited to breakdown of skin: Secondary | ICD-10-CM | POA: Diagnosis not present

## 2016-04-22 DIAGNOSIS — I872 Venous insufficiency (chronic) (peripheral): Secondary | ICD-10-CM | POA: Diagnosis not present

## 2016-04-22 DIAGNOSIS — I87312 Chronic venous hypertension (idiopathic) with ulcer of left lower extremity: Secondary | ICD-10-CM | POA: Diagnosis not present

## 2016-04-24 DIAGNOSIS — I87312 Chronic venous hypertension (idiopathic) with ulcer of left lower extremity: Secondary | ICD-10-CM | POA: Diagnosis not present

## 2016-04-24 DIAGNOSIS — I130 Hypertensive heart and chronic kidney disease with heart failure and stage 1 through stage 4 chronic kidney disease, or unspecified chronic kidney disease: Secondary | ICD-10-CM | POA: Diagnosis not present

## 2016-04-24 DIAGNOSIS — I70202 Unspecified atherosclerosis of native arteries of extremities, left leg: Secondary | ICD-10-CM | POA: Diagnosis not present

## 2016-04-24 DIAGNOSIS — L97322 Non-pressure chronic ulcer of left ankle with fat layer exposed: Secondary | ICD-10-CM | POA: Diagnosis not present

## 2016-04-24 DIAGNOSIS — J189 Pneumonia, unspecified organism: Secondary | ICD-10-CM | POA: Diagnosis not present

## 2016-04-24 DIAGNOSIS — L03116 Cellulitis of left lower limb: Secondary | ICD-10-CM | POA: Diagnosis not present

## 2016-04-26 DIAGNOSIS — I130 Hypertensive heart and chronic kidney disease with heart failure and stage 1 through stage 4 chronic kidney disease, or unspecified chronic kidney disease: Secondary | ICD-10-CM | POA: Diagnosis not present

## 2016-04-26 DIAGNOSIS — I70202 Unspecified atherosclerosis of native arteries of extremities, left leg: Secondary | ICD-10-CM | POA: Diagnosis not present

## 2016-04-26 DIAGNOSIS — I87312 Chronic venous hypertension (idiopathic) with ulcer of left lower extremity: Secondary | ICD-10-CM | POA: Diagnosis not present

## 2016-04-26 DIAGNOSIS — J189 Pneumonia, unspecified organism: Secondary | ICD-10-CM | POA: Diagnosis not present

## 2016-04-26 DIAGNOSIS — L03116 Cellulitis of left lower limb: Secondary | ICD-10-CM | POA: Diagnosis not present

## 2016-04-26 DIAGNOSIS — L97322 Non-pressure chronic ulcer of left ankle with fat layer exposed: Secondary | ICD-10-CM | POA: Diagnosis not present

## 2016-04-29 DIAGNOSIS — I87312 Chronic venous hypertension (idiopathic) with ulcer of left lower extremity: Secondary | ICD-10-CM | POA: Diagnosis not present

## 2016-04-29 DIAGNOSIS — I872 Venous insufficiency (chronic) (peripheral): Secondary | ICD-10-CM | POA: Diagnosis not present

## 2016-04-29 DIAGNOSIS — L97821 Non-pressure chronic ulcer of other part of left lower leg limited to breakdown of skin: Secondary | ICD-10-CM | POA: Diagnosis not present

## 2016-04-30 DIAGNOSIS — Z7982 Long term (current) use of aspirin: Secondary | ICD-10-CM | POA: Diagnosis not present

## 2016-04-30 DIAGNOSIS — F0390 Unspecified dementia without behavioral disturbance: Secondary | ICD-10-CM | POA: Diagnosis not present

## 2016-04-30 DIAGNOSIS — N189 Chronic kidney disease, unspecified: Secondary | ICD-10-CM | POA: Diagnosis not present

## 2016-04-30 DIAGNOSIS — I2510 Atherosclerotic heart disease of native coronary artery without angina pectoris: Secondary | ICD-10-CM | POA: Diagnosis not present

## 2016-04-30 DIAGNOSIS — I70202 Unspecified atherosclerosis of native arteries of extremities, left leg: Secondary | ICD-10-CM | POA: Diagnosis not present

## 2016-04-30 DIAGNOSIS — I252 Old myocardial infarction: Secondary | ICD-10-CM | POA: Diagnosis not present

## 2016-04-30 DIAGNOSIS — Z792 Long term (current) use of antibiotics: Secondary | ICD-10-CM | POA: Diagnosis not present

## 2016-04-30 DIAGNOSIS — I87312 Chronic venous hypertension (idiopathic) with ulcer of left lower extremity: Secondary | ICD-10-CM | POA: Diagnosis not present

## 2016-04-30 DIAGNOSIS — I5022 Chronic systolic (congestive) heart failure: Secondary | ICD-10-CM | POA: Diagnosis not present

## 2016-04-30 DIAGNOSIS — I480 Paroxysmal atrial fibrillation: Secondary | ICD-10-CM | POA: Diagnosis not present

## 2016-04-30 DIAGNOSIS — L03116 Cellulitis of left lower limb: Secondary | ICD-10-CM | POA: Diagnosis not present

## 2016-04-30 DIAGNOSIS — J189 Pneumonia, unspecified organism: Secondary | ICD-10-CM | POA: Diagnosis not present

## 2016-04-30 DIAGNOSIS — I130 Hypertensive heart and chronic kidney disease with heart failure and stage 1 through stage 4 chronic kidney disease, or unspecified chronic kidney disease: Secondary | ICD-10-CM | POA: Diagnosis not present

## 2016-04-30 DIAGNOSIS — L97322 Non-pressure chronic ulcer of left ankle with fat layer exposed: Secondary | ICD-10-CM | POA: Diagnosis not present

## 2016-04-30 DIAGNOSIS — F419 Anxiety disorder, unspecified: Secondary | ICD-10-CM | POA: Diagnosis not present

## 2016-05-01 DIAGNOSIS — I130 Hypertensive heart and chronic kidney disease with heart failure and stage 1 through stage 4 chronic kidney disease, or unspecified chronic kidney disease: Secondary | ICD-10-CM | POA: Diagnosis not present

## 2016-05-01 DIAGNOSIS — L03116 Cellulitis of left lower limb: Secondary | ICD-10-CM | POA: Diagnosis not present

## 2016-05-01 DIAGNOSIS — J189 Pneumonia, unspecified organism: Secondary | ICD-10-CM | POA: Diagnosis not present

## 2016-05-01 DIAGNOSIS — I70202 Unspecified atherosclerosis of native arteries of extremities, left leg: Secondary | ICD-10-CM | POA: Diagnosis not present

## 2016-05-01 DIAGNOSIS — L97322 Non-pressure chronic ulcer of left ankle with fat layer exposed: Secondary | ICD-10-CM | POA: Diagnosis not present

## 2016-05-01 DIAGNOSIS — I87312 Chronic venous hypertension (idiopathic) with ulcer of left lower extremity: Secondary | ICD-10-CM | POA: Diagnosis not present

## 2016-05-03 DIAGNOSIS — J189 Pneumonia, unspecified organism: Secondary | ICD-10-CM | POA: Diagnosis not present

## 2016-05-03 DIAGNOSIS — I87312 Chronic venous hypertension (idiopathic) with ulcer of left lower extremity: Secondary | ICD-10-CM | POA: Diagnosis not present

## 2016-05-03 DIAGNOSIS — L03116 Cellulitis of left lower limb: Secondary | ICD-10-CM | POA: Diagnosis not present

## 2016-05-03 DIAGNOSIS — N401 Enlarged prostate with lower urinary tract symptoms: Secondary | ICD-10-CM | POA: Diagnosis not present

## 2016-05-03 DIAGNOSIS — I70202 Unspecified atherosclerosis of native arteries of extremities, left leg: Secondary | ICD-10-CM | POA: Diagnosis not present

## 2016-05-03 DIAGNOSIS — N3281 Overactive bladder: Secondary | ICD-10-CM | POA: Diagnosis not present

## 2016-05-03 DIAGNOSIS — L97322 Non-pressure chronic ulcer of left ankle with fat layer exposed: Secondary | ICD-10-CM | POA: Diagnosis not present

## 2016-05-03 DIAGNOSIS — I130 Hypertensive heart and chronic kidney disease with heart failure and stage 1 through stage 4 chronic kidney disease, or unspecified chronic kidney disease: Secondary | ICD-10-CM | POA: Diagnosis not present

## 2016-05-06 DIAGNOSIS — L97222 Non-pressure chronic ulcer of left calf with fat layer exposed: Secondary | ICD-10-CM | POA: Diagnosis not present

## 2016-05-06 DIAGNOSIS — L97821 Non-pressure chronic ulcer of other part of left lower leg limited to breakdown of skin: Secondary | ICD-10-CM | POA: Diagnosis not present

## 2016-05-06 DIAGNOSIS — I87312 Chronic venous hypertension (idiopathic) with ulcer of left lower extremity: Secondary | ICD-10-CM | POA: Diagnosis not present

## 2016-05-06 DIAGNOSIS — I872 Venous insufficiency (chronic) (peripheral): Secondary | ICD-10-CM | POA: Diagnosis not present

## 2016-05-08 DIAGNOSIS — I70202 Unspecified atherosclerosis of native arteries of extremities, left leg: Secondary | ICD-10-CM | POA: Diagnosis not present

## 2016-05-08 DIAGNOSIS — I87312 Chronic venous hypertension (idiopathic) with ulcer of left lower extremity: Secondary | ICD-10-CM | POA: Diagnosis not present

## 2016-05-08 DIAGNOSIS — J189 Pneumonia, unspecified organism: Secondary | ICD-10-CM | POA: Diagnosis not present

## 2016-05-08 DIAGNOSIS — L97322 Non-pressure chronic ulcer of left ankle with fat layer exposed: Secondary | ICD-10-CM | POA: Diagnosis not present

## 2016-05-08 DIAGNOSIS — L03116 Cellulitis of left lower limb: Secondary | ICD-10-CM | POA: Diagnosis not present

## 2016-05-08 DIAGNOSIS — I130 Hypertensive heart and chronic kidney disease with heart failure and stage 1 through stage 4 chronic kidney disease, or unspecified chronic kidney disease: Secondary | ICD-10-CM | POA: Diagnosis not present

## 2016-05-09 DIAGNOSIS — Z0000 Encounter for general adult medical examination without abnormal findings: Secondary | ICD-10-CM | POA: Diagnosis not present

## 2016-05-09 DIAGNOSIS — F339 Major depressive disorder, recurrent, unspecified: Secondary | ICD-10-CM | POA: Diagnosis not present

## 2016-05-10 DIAGNOSIS — L03116 Cellulitis of left lower limb: Secondary | ICD-10-CM | POA: Diagnosis not present

## 2016-05-10 DIAGNOSIS — I70202 Unspecified atherosclerosis of native arteries of extremities, left leg: Secondary | ICD-10-CM | POA: Diagnosis not present

## 2016-05-10 DIAGNOSIS — L97322 Non-pressure chronic ulcer of left ankle with fat layer exposed: Secondary | ICD-10-CM | POA: Diagnosis not present

## 2016-05-10 DIAGNOSIS — J189 Pneumonia, unspecified organism: Secondary | ICD-10-CM | POA: Diagnosis not present

## 2016-05-10 DIAGNOSIS — I87312 Chronic venous hypertension (idiopathic) with ulcer of left lower extremity: Secondary | ICD-10-CM | POA: Diagnosis not present

## 2016-05-10 DIAGNOSIS — I130 Hypertensive heart and chronic kidney disease with heart failure and stage 1 through stage 4 chronic kidney disease, or unspecified chronic kidney disease: Secondary | ICD-10-CM | POA: Diagnosis not present

## 2016-05-13 DIAGNOSIS — L97222 Non-pressure chronic ulcer of left calf with fat layer exposed: Secondary | ICD-10-CM | POA: Diagnosis not present

## 2016-05-13 DIAGNOSIS — I87312 Chronic venous hypertension (idiopathic) with ulcer of left lower extremity: Secondary | ICD-10-CM | POA: Diagnosis not present

## 2016-05-13 DIAGNOSIS — L97821 Non-pressure chronic ulcer of other part of left lower leg limited to breakdown of skin: Secondary | ICD-10-CM | POA: Diagnosis not present

## 2016-05-13 DIAGNOSIS — I872 Venous insufficiency (chronic) (peripheral): Secondary | ICD-10-CM | POA: Diagnosis not present

## 2016-05-15 DIAGNOSIS — I130 Hypertensive heart and chronic kidney disease with heart failure and stage 1 through stage 4 chronic kidney disease, or unspecified chronic kidney disease: Secondary | ICD-10-CM | POA: Diagnosis not present

## 2016-05-15 DIAGNOSIS — L97322 Non-pressure chronic ulcer of left ankle with fat layer exposed: Secondary | ICD-10-CM | POA: Diagnosis not present

## 2016-05-15 DIAGNOSIS — L03116 Cellulitis of left lower limb: Secondary | ICD-10-CM | POA: Diagnosis not present

## 2016-05-15 DIAGNOSIS — I87312 Chronic venous hypertension (idiopathic) with ulcer of left lower extremity: Secondary | ICD-10-CM | POA: Diagnosis not present

## 2016-05-15 DIAGNOSIS — J189 Pneumonia, unspecified organism: Secondary | ICD-10-CM | POA: Diagnosis not present

## 2016-05-15 DIAGNOSIS — I70202 Unspecified atherosclerosis of native arteries of extremities, left leg: Secondary | ICD-10-CM | POA: Diagnosis not present

## 2016-05-17 DIAGNOSIS — I87312 Chronic venous hypertension (idiopathic) with ulcer of left lower extremity: Secondary | ICD-10-CM | POA: Diagnosis not present

## 2016-05-17 DIAGNOSIS — J189 Pneumonia, unspecified organism: Secondary | ICD-10-CM | POA: Diagnosis not present

## 2016-05-17 DIAGNOSIS — L03116 Cellulitis of left lower limb: Secondary | ICD-10-CM | POA: Diagnosis not present

## 2016-05-17 DIAGNOSIS — I130 Hypertensive heart and chronic kidney disease with heart failure and stage 1 through stage 4 chronic kidney disease, or unspecified chronic kidney disease: Secondary | ICD-10-CM | POA: Diagnosis not present

## 2016-05-17 DIAGNOSIS — I70202 Unspecified atherosclerosis of native arteries of extremities, left leg: Secondary | ICD-10-CM | POA: Diagnosis not present

## 2016-05-17 DIAGNOSIS — L97322 Non-pressure chronic ulcer of left ankle with fat layer exposed: Secondary | ICD-10-CM | POA: Diagnosis not present

## 2016-05-20 DIAGNOSIS — I87312 Chronic venous hypertension (idiopathic) with ulcer of left lower extremity: Secondary | ICD-10-CM | POA: Diagnosis not present

## 2016-05-20 DIAGNOSIS — L97822 Non-pressure chronic ulcer of other part of left lower leg with fat layer exposed: Secondary | ICD-10-CM | POA: Diagnosis not present

## 2016-05-20 DIAGNOSIS — I872 Venous insufficiency (chronic) (peripheral): Secondary | ICD-10-CM | POA: Diagnosis not present

## 2016-05-20 DIAGNOSIS — L97821 Non-pressure chronic ulcer of other part of left lower leg limited to breakdown of skin: Secondary | ICD-10-CM | POA: Diagnosis not present

## 2016-05-22 DIAGNOSIS — J189 Pneumonia, unspecified organism: Secondary | ICD-10-CM | POA: Diagnosis not present

## 2016-05-22 DIAGNOSIS — I87312 Chronic venous hypertension (idiopathic) with ulcer of left lower extremity: Secondary | ICD-10-CM | POA: Diagnosis not present

## 2016-05-22 DIAGNOSIS — I70202 Unspecified atherosclerosis of native arteries of extremities, left leg: Secondary | ICD-10-CM | POA: Diagnosis not present

## 2016-05-22 DIAGNOSIS — L03116 Cellulitis of left lower limb: Secondary | ICD-10-CM | POA: Diagnosis not present

## 2016-05-22 DIAGNOSIS — M67919 Unspecified disorder of synovium and tendon, unspecified shoulder: Secondary | ICD-10-CM | POA: Diagnosis not present

## 2016-05-22 DIAGNOSIS — L97322 Non-pressure chronic ulcer of left ankle with fat layer exposed: Secondary | ICD-10-CM | POA: Diagnosis not present

## 2016-05-22 DIAGNOSIS — I130 Hypertensive heart and chronic kidney disease with heart failure and stage 1 through stage 4 chronic kidney disease, or unspecified chronic kidney disease: Secondary | ICD-10-CM | POA: Diagnosis not present

## 2016-05-24 DIAGNOSIS — J189 Pneumonia, unspecified organism: Secondary | ICD-10-CM | POA: Diagnosis not present

## 2016-05-24 DIAGNOSIS — I70202 Unspecified atherosclerosis of native arteries of extremities, left leg: Secondary | ICD-10-CM | POA: Diagnosis not present

## 2016-05-24 DIAGNOSIS — I87312 Chronic venous hypertension (idiopathic) with ulcer of left lower extremity: Secondary | ICD-10-CM | POA: Diagnosis not present

## 2016-05-24 DIAGNOSIS — I130 Hypertensive heart and chronic kidney disease with heart failure and stage 1 through stage 4 chronic kidney disease, or unspecified chronic kidney disease: Secondary | ICD-10-CM | POA: Diagnosis not present

## 2016-05-24 DIAGNOSIS — L03116 Cellulitis of left lower limb: Secondary | ICD-10-CM | POA: Diagnosis not present

## 2016-05-24 DIAGNOSIS — L97322 Non-pressure chronic ulcer of left ankle with fat layer exposed: Secondary | ICD-10-CM | POA: Diagnosis not present

## 2016-05-28 DIAGNOSIS — B351 Tinea unguium: Secondary | ICD-10-CM | POA: Diagnosis not present

## 2016-05-28 DIAGNOSIS — I872 Venous insufficiency (chronic) (peripheral): Secondary | ICD-10-CM | POA: Diagnosis not present

## 2016-05-28 DIAGNOSIS — I87332 Chronic venous hypertension (idiopathic) with ulcer and inflammation of left lower extremity: Secondary | ICD-10-CM | POA: Diagnosis not present

## 2016-05-28 DIAGNOSIS — L97821 Non-pressure chronic ulcer of other part of left lower leg limited to breakdown of skin: Secondary | ICD-10-CM | POA: Diagnosis not present

## 2016-05-28 DIAGNOSIS — I779 Disorder of arteries and arterioles, unspecified: Secondary | ICD-10-CM | POA: Diagnosis not present

## 2016-05-29 DIAGNOSIS — J189 Pneumonia, unspecified organism: Secondary | ICD-10-CM | POA: Diagnosis not present

## 2016-05-29 DIAGNOSIS — L97322 Non-pressure chronic ulcer of left ankle with fat layer exposed: Secondary | ICD-10-CM | POA: Diagnosis not present

## 2016-05-29 DIAGNOSIS — I70202 Unspecified atherosclerosis of native arteries of extremities, left leg: Secondary | ICD-10-CM | POA: Diagnosis not present

## 2016-05-29 DIAGNOSIS — I130 Hypertensive heart and chronic kidney disease with heart failure and stage 1 through stage 4 chronic kidney disease, or unspecified chronic kidney disease: Secondary | ICD-10-CM | POA: Diagnosis not present

## 2016-05-29 DIAGNOSIS — I87312 Chronic venous hypertension (idiopathic) with ulcer of left lower extremity: Secondary | ICD-10-CM | POA: Diagnosis not present

## 2016-05-29 DIAGNOSIS — L03116 Cellulitis of left lower limb: Secondary | ICD-10-CM | POA: Diagnosis not present

## 2016-05-31 DIAGNOSIS — J189 Pneumonia, unspecified organism: Secondary | ICD-10-CM | POA: Diagnosis not present

## 2016-05-31 DIAGNOSIS — L97322 Non-pressure chronic ulcer of left ankle with fat layer exposed: Secondary | ICD-10-CM | POA: Diagnosis not present

## 2016-05-31 DIAGNOSIS — I130 Hypertensive heart and chronic kidney disease with heart failure and stage 1 through stage 4 chronic kidney disease, or unspecified chronic kidney disease: Secondary | ICD-10-CM | POA: Diagnosis not present

## 2016-05-31 DIAGNOSIS — M19012 Primary osteoarthritis, left shoulder: Secondary | ICD-10-CM | POA: Diagnosis not present

## 2016-05-31 DIAGNOSIS — L03116 Cellulitis of left lower limb: Secondary | ICD-10-CM | POA: Diagnosis not present

## 2016-05-31 DIAGNOSIS — I87312 Chronic venous hypertension (idiopathic) with ulcer of left lower extremity: Secondary | ICD-10-CM | POA: Diagnosis not present

## 2016-05-31 DIAGNOSIS — I70202 Unspecified atherosclerosis of native arteries of extremities, left leg: Secondary | ICD-10-CM | POA: Diagnosis not present

## 2016-05-31 DIAGNOSIS — M19011 Primary osteoarthritis, right shoulder: Secondary | ICD-10-CM | POA: Diagnosis not present

## 2016-06-03 DIAGNOSIS — L97822 Non-pressure chronic ulcer of other part of left lower leg with fat layer exposed: Secondary | ICD-10-CM | POA: Diagnosis not present

## 2016-06-03 DIAGNOSIS — I872 Venous insufficiency (chronic) (peripheral): Secondary | ICD-10-CM | POA: Diagnosis not present

## 2016-06-05 DIAGNOSIS — J189 Pneumonia, unspecified organism: Secondary | ICD-10-CM | POA: Diagnosis not present

## 2016-06-05 DIAGNOSIS — L03116 Cellulitis of left lower limb: Secondary | ICD-10-CM | POA: Diagnosis not present

## 2016-06-05 DIAGNOSIS — I130 Hypertensive heart and chronic kidney disease with heart failure and stage 1 through stage 4 chronic kidney disease, or unspecified chronic kidney disease: Secondary | ICD-10-CM | POA: Diagnosis not present

## 2016-06-05 DIAGNOSIS — I70202 Unspecified atherosclerosis of native arteries of extremities, left leg: Secondary | ICD-10-CM | POA: Diagnosis not present

## 2016-06-05 DIAGNOSIS — I87312 Chronic venous hypertension (idiopathic) with ulcer of left lower extremity: Secondary | ICD-10-CM | POA: Diagnosis not present

## 2016-06-05 DIAGNOSIS — L97322 Non-pressure chronic ulcer of left ankle with fat layer exposed: Secondary | ICD-10-CM | POA: Diagnosis not present

## 2016-06-06 DIAGNOSIS — R030 Elevated blood-pressure reading, without diagnosis of hypertension: Secondary | ICD-10-CM | POA: Diagnosis not present

## 2016-06-06 DIAGNOSIS — R079 Chest pain, unspecified: Secondary | ICD-10-CM | POA: Diagnosis not present

## 2016-06-06 DIAGNOSIS — M79602 Pain in left arm: Secondary | ICD-10-CM | POA: Diagnosis not present

## 2016-06-07 DIAGNOSIS — I130 Hypertensive heart and chronic kidney disease with heart failure and stage 1 through stage 4 chronic kidney disease, or unspecified chronic kidney disease: Secondary | ICD-10-CM | POA: Diagnosis not present

## 2016-06-07 DIAGNOSIS — L97322 Non-pressure chronic ulcer of left ankle with fat layer exposed: Secondary | ICD-10-CM | POA: Diagnosis not present

## 2016-06-07 DIAGNOSIS — J189 Pneumonia, unspecified organism: Secondary | ICD-10-CM | POA: Diagnosis not present

## 2016-06-07 DIAGNOSIS — I70202 Unspecified atherosclerosis of native arteries of extremities, left leg: Secondary | ICD-10-CM | POA: Diagnosis not present

## 2016-06-07 DIAGNOSIS — I87312 Chronic venous hypertension (idiopathic) with ulcer of left lower extremity: Secondary | ICD-10-CM | POA: Diagnosis not present

## 2016-06-07 DIAGNOSIS — L03116 Cellulitis of left lower limb: Secondary | ICD-10-CM | POA: Diagnosis not present

## 2016-06-10 DIAGNOSIS — L97822 Non-pressure chronic ulcer of other part of left lower leg with fat layer exposed: Secondary | ICD-10-CM | POA: Diagnosis not present

## 2016-06-10 DIAGNOSIS — I872 Venous insufficiency (chronic) (peripheral): Secondary | ICD-10-CM | POA: Diagnosis not present

## 2016-06-10 DIAGNOSIS — L97222 Non-pressure chronic ulcer of left calf with fat layer exposed: Secondary | ICD-10-CM | POA: Diagnosis not present

## 2016-06-12 ENCOUNTER — Ambulatory Visit: Payer: Self-pay | Admitting: Allergy and Immunology

## 2016-06-12 DIAGNOSIS — J189 Pneumonia, unspecified organism: Secondary | ICD-10-CM | POA: Diagnosis not present

## 2016-06-12 DIAGNOSIS — I70202 Unspecified atherosclerosis of native arteries of extremities, left leg: Secondary | ICD-10-CM | POA: Diagnosis not present

## 2016-06-12 DIAGNOSIS — I87312 Chronic venous hypertension (idiopathic) with ulcer of left lower extremity: Secondary | ICD-10-CM | POA: Diagnosis not present

## 2016-06-12 DIAGNOSIS — L97322 Non-pressure chronic ulcer of left ankle with fat layer exposed: Secondary | ICD-10-CM | POA: Diagnosis not present

## 2016-06-12 DIAGNOSIS — I130 Hypertensive heart and chronic kidney disease with heart failure and stage 1 through stage 4 chronic kidney disease, or unspecified chronic kidney disease: Secondary | ICD-10-CM | POA: Diagnosis not present

## 2016-06-12 DIAGNOSIS — L03116 Cellulitis of left lower limb: Secondary | ICD-10-CM | POA: Diagnosis not present

## 2016-06-14 DIAGNOSIS — L03116 Cellulitis of left lower limb: Secondary | ICD-10-CM | POA: Diagnosis not present

## 2016-06-14 DIAGNOSIS — I130 Hypertensive heart and chronic kidney disease with heart failure and stage 1 through stage 4 chronic kidney disease, or unspecified chronic kidney disease: Secondary | ICD-10-CM | POA: Diagnosis not present

## 2016-06-14 DIAGNOSIS — L97322 Non-pressure chronic ulcer of left ankle with fat layer exposed: Secondary | ICD-10-CM | POA: Diagnosis not present

## 2016-06-14 DIAGNOSIS — I87312 Chronic venous hypertension (idiopathic) with ulcer of left lower extremity: Secondary | ICD-10-CM | POA: Diagnosis not present

## 2016-06-14 DIAGNOSIS — I70202 Unspecified atherosclerosis of native arteries of extremities, left leg: Secondary | ICD-10-CM | POA: Diagnosis not present

## 2016-06-14 DIAGNOSIS — J189 Pneumonia, unspecified organism: Secondary | ICD-10-CM | POA: Diagnosis not present

## 2016-06-17 DIAGNOSIS — M67919 Unspecified disorder of synovium and tendon, unspecified shoulder: Secondary | ICD-10-CM | POA: Diagnosis not present

## 2016-06-17 DIAGNOSIS — M2550 Pain in unspecified joint: Secondary | ICD-10-CM | POA: Diagnosis not present

## 2016-06-18 DIAGNOSIS — I87312 Chronic venous hypertension (idiopathic) with ulcer of left lower extremity: Secondary | ICD-10-CM | POA: Diagnosis not present

## 2016-06-18 DIAGNOSIS — L97822 Non-pressure chronic ulcer of other part of left lower leg with fat layer exposed: Secondary | ICD-10-CM | POA: Diagnosis not present

## 2016-06-18 DIAGNOSIS — I872 Venous insufficiency (chronic) (peripheral): Secondary | ICD-10-CM | POA: Diagnosis not present

## 2016-06-19 DIAGNOSIS — I70202 Unspecified atherosclerosis of native arteries of extremities, left leg: Secondary | ICD-10-CM | POA: Diagnosis not present

## 2016-06-19 DIAGNOSIS — L03116 Cellulitis of left lower limb: Secondary | ICD-10-CM | POA: Diagnosis not present

## 2016-06-19 DIAGNOSIS — J189 Pneumonia, unspecified organism: Secondary | ICD-10-CM | POA: Diagnosis not present

## 2016-06-19 DIAGNOSIS — I130 Hypertensive heart and chronic kidney disease with heart failure and stage 1 through stage 4 chronic kidney disease, or unspecified chronic kidney disease: Secondary | ICD-10-CM | POA: Diagnosis not present

## 2016-06-19 DIAGNOSIS — I87312 Chronic venous hypertension (idiopathic) with ulcer of left lower extremity: Secondary | ICD-10-CM | POA: Diagnosis not present

## 2016-06-19 DIAGNOSIS — L97322 Non-pressure chronic ulcer of left ankle with fat layer exposed: Secondary | ICD-10-CM | POA: Diagnosis not present

## 2016-06-21 DIAGNOSIS — I87312 Chronic venous hypertension (idiopathic) with ulcer of left lower extremity: Secondary | ICD-10-CM | POA: Diagnosis not present

## 2016-06-21 DIAGNOSIS — I70202 Unspecified atherosclerosis of native arteries of extremities, left leg: Secondary | ICD-10-CM | POA: Diagnosis not present

## 2016-06-21 DIAGNOSIS — I130 Hypertensive heart and chronic kidney disease with heart failure and stage 1 through stage 4 chronic kidney disease, or unspecified chronic kidney disease: Secondary | ICD-10-CM | POA: Diagnosis not present

## 2016-06-21 DIAGNOSIS — J189 Pneumonia, unspecified organism: Secondary | ICD-10-CM | POA: Diagnosis not present

## 2016-06-21 DIAGNOSIS — L97322 Non-pressure chronic ulcer of left ankle with fat layer exposed: Secondary | ICD-10-CM | POA: Diagnosis not present

## 2016-06-21 DIAGNOSIS — L03116 Cellulitis of left lower limb: Secondary | ICD-10-CM | POA: Diagnosis not present

## 2016-06-24 DIAGNOSIS — I87312 Chronic venous hypertension (idiopathic) with ulcer of left lower extremity: Secondary | ICD-10-CM | POA: Diagnosis not present

## 2016-06-24 DIAGNOSIS — L97822 Non-pressure chronic ulcer of other part of left lower leg with fat layer exposed: Secondary | ICD-10-CM | POA: Diagnosis not present

## 2016-06-24 DIAGNOSIS — I872 Venous insufficiency (chronic) (peripheral): Secondary | ICD-10-CM | POA: Diagnosis not present

## 2016-06-24 DIAGNOSIS — L97222 Non-pressure chronic ulcer of left calf with fat layer exposed: Secondary | ICD-10-CM | POA: Diagnosis not present

## 2016-06-26 ENCOUNTER — Ambulatory Visit (INDEPENDENT_AMBULATORY_CARE_PROVIDER_SITE_OTHER): Payer: Medicare Other | Admitting: Allergy and Immunology

## 2016-06-26 ENCOUNTER — Encounter: Payer: Self-pay | Admitting: Allergy and Immunology

## 2016-06-26 VITALS — BP 130/80 | HR 80 | Temp 98.6°F | Resp 18 | Ht 66.81 in | Wt 205.6 lb

## 2016-06-26 DIAGNOSIS — J4541 Moderate persistent asthma with (acute) exacerbation: Secondary | ICD-10-CM

## 2016-06-26 DIAGNOSIS — L97322 Non-pressure chronic ulcer of left ankle with fat layer exposed: Secondary | ICD-10-CM | POA: Diagnosis not present

## 2016-06-26 DIAGNOSIS — L570 Actinic keratosis: Secondary | ICD-10-CM

## 2016-06-26 DIAGNOSIS — J189 Pneumonia, unspecified organism: Secondary | ICD-10-CM | POA: Diagnosis not present

## 2016-06-26 DIAGNOSIS — I779 Disorder of arteries and arterioles, unspecified: Secondary | ICD-10-CM

## 2016-06-26 DIAGNOSIS — L03116 Cellulitis of left lower limb: Secondary | ICD-10-CM | POA: Diagnosis not present

## 2016-06-26 DIAGNOSIS — K219 Gastro-esophageal reflux disease without esophagitis: Secondary | ICD-10-CM

## 2016-06-26 DIAGNOSIS — I70202 Unspecified atherosclerosis of native arteries of extremities, left leg: Secondary | ICD-10-CM | POA: Diagnosis not present

## 2016-06-26 DIAGNOSIS — I130 Hypertensive heart and chronic kidney disease with heart failure and stage 1 through stage 4 chronic kidney disease, or unspecified chronic kidney disease: Secondary | ICD-10-CM | POA: Diagnosis not present

## 2016-06-26 DIAGNOSIS — I87312 Chronic venous hypertension (idiopathic) with ulcer of left lower extremity: Secondary | ICD-10-CM | POA: Diagnosis not present

## 2016-06-26 MED ORDER — IPRATROPIUM-ALBUTEROL 0.5-2.5 (3) MG/3ML IN SOLN: RESPIRATORY_TRACT | 1 refills | Status: AC

## 2016-06-26 MED ORDER — ARFORMOTEROL TARTRATE 15 MCG/2ML IN NEBU: INHALATION_SOLUTION | RESPIRATORY_TRACT | 5 refills | Status: DC

## 2016-06-26 MED ORDER — BUDESONIDE 0.5 MG/2ML IN SUSP: RESPIRATORY_TRACT | 5 refills | Status: DC

## 2016-06-26 MED ORDER — RANITIDINE HCL 300 MG PO TABS: 300.0000 mg | ORAL_TABLET | Freq: Every day | ORAL | 5 refills | Status: AC

## 2016-06-26 NOTE — Progress Notes (Signed)
Dear Dr. Nicki Reaper,  Thank you for referring Gavin [name] to the [facility] on 06/26/2016.   Below is a summation of this patient's evaluation and recommendations.  Thank you for your referral. I will keep you informed about this patient's response to treatment.   If you have any questions please to do hesitate to contact me.   Sincerely,  Jiles Prows, MD [place]   ______________________________________________________________________    NEW PATIENT NOTE  Referring Provider: Myer Peer, MD Primary Provider: Ann Held, MD Date of office visit: 06/26/2016    Subjective:   Chief Complaint:  Gavin [name] (DOB: 1929-09-05) is a 82 y.o. male who presents to the clinic on 06/26/2016 with a chief complaint of Cough .     HPI: Gavin [name] presents to this clinic in evaluation of cough. He states that he has had many years of cough with phlegm production especially in the morning but also occurring throughout the day. He has a very difficult time clearing out his chest and sometimes goes through coughing spells. He does have a dry sensation in his throat but no history of recurrent raspy voice or lump in his throat or painful swallowing. There does not appear to be a significant amount of shortness of breath or chest tightness with these episodes and he does not really exercise to any large degree at this point in time secondary to his heart condition which includes coronary artery disease and congestive heart failure, and his peripheral vascular disease which includes stasis ulcers from an ischemic condition that was rectified recently with placement of stents in his lower extremity.  He does have gastroesophageal reflux disease which he thinks is under pretty good control at this point in time while using Protonix.  He does have issues with sniffing and nose blowing and some clear  rhinorrhea and occasional sneezing but no anosmia or ugly nasal discharge at this point in time.  Past Medical History:  Diagnosis Date  . Allergy   . Anemia   . Arthritis    "all over"  . CAD (coronary artery disease)    a. 2007 STEMI: stent to LAD;  b. multiple PCI's. c. 10/2014 NSTEMI/PCI: LM nl, LAD 142m, D1 50ost, LCX nl/small, RCA 75m (3.0x20 Promus DES), EF 45-50% (this admission is under MR # [id number])  . Carotid disease, bilateral ([place])    left greater than right  . CHF (congestive heart failure) ([place])   . Chronic back pain    "all over"  . Daily headache    negative work up, no change off Imdur  . Depression   . DVT (deep venous thrombosis) ([place]) ~ 1980   "in one of his legs; was in ICU for some time"  . GERD (gastroesophageal reflux disease)   . Hyperlipidemia   . Hypertension   . Lung nodules    CT 07-01-10 stable @ [facility]  . Myocardial infarction    "he's had 4; last one was 11/2014" (05/22/2015)  . OSA (obstructive sleep apnea)    NPSG 05-05-99 RDI 65/hr-CPAP  . OSA on CPAP   . PAF (paroxysmal atrial fibrillation) ([place])   . Pneumonia ~ 2013  . PVD (peripheral vascular disease) ([place]) 2004   stenting of both SFA's 2004, critical limb ischemia  . Reflux esophagitis   . Venous ulcer of leg [facility])     Past Surgical History:  Procedure Laterality  Date  . APPENDECTOMY    . CARDIAC CATHETERIZATION  10/03/2008   Occluded LAD  . CARDIAC CATHETERIZATION  06/01/13   medical Rx  . CAROTID DOPPLER  05/29/2012   bilateral ICA stenosis  . CATARACT EXTRACTION W/ INTRAOCULAR LENS  IMPLANT, BILATERAL Bilateral 2015  . CORONARY ANGIOPLASTY WITH STENT PLACEMENT  06/13/2004   successful cutting balloon atherectomy/PIi & STENTING mid LAD & POBA of the distal LAD  . CORONARY ANGIOPLASTY WITH STENT PLACEMENT  06/03/2006   successful PTCA & stent mid LAD  . CORONARY ANGIOPLASTY WITH STENT PLACEMENT  06/06/2006   successful double stenting of the ostial and prox RCA, successful PTCA LAD  of the ostium major diag branch due to jailing by a previously placed LAD stent,successful mid kiagonal branch primary stenting  . CORONARY ANGIOPLASTY WITH STENT PLACEMENT  09/20/2008   PTCA LAD stent  . LEFT HEART CATHETERIZATION WITH CORONARY ANGIOGRAM N/A 11/14/2014   Procedure: LEFT HEART CATHETERIZATION WITH CORONARY ANGIOGRAM;  Surgeon: Blane Ohara, MD;  Location: [facility] CATH LAB;  Service: Cardiovascular;  Laterality: N/A;  . LEFT HEART CATHETERIZATION WITH CORONARY ANGIOGRAM N/A 06/01/2013   Procedure: LEFT HEART CATHETERIZATION WITH CORONARY ANGIOGRAM;  Surgeon: Leonie Man, MD;  Location: [facility] CATH LAB;  Service: Cardiovascular;  Laterality: N/A;  . NM MYOVIEW LTD  08/01/2011   Abnormal-intermed to high risk based upon per-infarct ischemia  . PERIPHERAL VASCULAR CATHETERIZATION N/A 05/22/2015   Procedure: Lower Extremity Angiography;  Surgeon: Lorretta Harp, MD;  Location: [place] CV LAB;  Service: Cardiovascular;  Laterality: N/A;  . SHOULDER OPEN ROTATOR CUFF REPAIR Right 1980's  . TONSILLECTOMY    . US ECHOCARDIOGRAPHY  12/24/2011   trace MR,TR,AI,PI. EF >55%      Medication List      acetaminophen 500 MG tablet Commonly known as:  TYLENOL Take 1,000 mg by mouth every 6 (six) hours as needed for moderate pain.   albuterol 108 (90 Base) MCG/ACT inhaler Commonly known as:  PROVENTIL HFA;VENTOLIN HFA Inhale into the lungs every 6 (six) hours as needed for wheezing or shortness of breath. Reported on 11/09/2015   amLODipine 10 MG tablet Commonly known as:  NORVASC Take 10 mg by mouth daily.   aspirin EC 81 MG tablet Take 81 mg by mouth at bedtime.   atorvastatin 40 MG tablet Commonly known as:  LIPITOR Take 1 tablet (40 mg total) by mouth at bedtime.   clopidogrel 75 MG tablet Commonly known as:  PLAVIX Take 1 tablet (75 mg total) by mouth daily.   DULoxetine 60 MG capsule Commonly known as:  CYMBALTA Take 60 mg by mouth daily.   fexofenadine 180 MG  tablet Commonly known as:  ALLEGRA Take 180 mg by mouth every morning.   furosemide 40 MG tablet Commonly known as:  LASIX   isosorbide mononitrate 30 MG 24 hr tablet Commonly known as:  IMDUR Take 30 mg by mouth 2 (two) times daily.   lactose free nutrition Liqd Take 237 mLs by mouth daily.   lidocaine 5 % Commonly known as:  LIDODERM   lidocaine 5 % Commonly known as:  LIDODERM apply 1 patch TO EACH SHOULDER FOR 16 HOURS PER DAY   metolazone 2.5 MG tablet Commonly known as:  ZAROXOLYN Take 2.5 mg by mouth every other day as needed (for fluid). Every other day   metoprolol succinate 50 MG 24 hr tablet Commonly known as:  TOPROL-XL Take 1 tablet (50mg ) by mouth in the morning and 1/2  tablet (25mg ) by mouth in the evening.   mirtazapine 15 MG tablet Commonly known as:  REMERON Take 15 mg by mouth at bedtime.   mometasone 50 MCG/ACT nasal spray Commonly known as:  NASONEX   multivitamin with minerals Tabs tablet Take 1 tablet by mouth daily.   NAMZARIC 28-10 MG Cp24 Generic drug:  Memantine HCl-Donepezil HCl Take 1 mg by mouth.   nitroGLYCERIN 0.4 MG SL tablet Commonly known as:  NITROSTAT Place 0.4 mg under the tongue every 5 (five) minutes as needed for chest pain.   oxybutynin 5 MG tablet Commonly known as:  DITROPAN   pantoprazole 40 MG tablet Commonly known as:  PROTONIX take 1 tablet by mouth once daily   potassium chloride 10 MEQ tablet Commonly known as:  K-DUR   RANEXA 1000 MG SR tablet Generic drug:  ranolazine Take 1,000 mg by mouth 2 (two) times daily.   RANEXA 500 MG 12 hr tablet Generic drug:  ranolazine take 1 tablet by mouth twice a day   STOOL SOFTENER PO Take by mouth.   tamsulosin 0.4 MG Caps capsule Commonly known as:  FLOMAX Take 0.4 mg by mouth daily after supper.   VITAMIN B 12 PO Take 1 tablet by mouth daily.   Vitamin D3 2000 units Tabs Take 1 tablet by mouth.       Allergies  Allergen Reactions  . Ciprofloxacin  Other (See Comments)    Unknown allergic reaction, possible shortness of breath  . Codeine Other (See Comments)    "knocked me out"  . Dexamethasone Other (See Comments)    Unknown allergic reaction  . Sulfa Antibiotics Nausea And Vomiting  . Sulfonamide Derivatives     Unknown reaction  . Telmisartan Other (See Comments)    Micardis - heart races    Review of systems negative except as noted in HPI / PMHx or noted below:  Review of Systems  Constitutional: Negative.   HENT: Negative.   Eyes: Negative.   Respiratory: Negative.   Cardiovascular: Negative.   Gastrointestinal: Negative.   Genitourinary: Negative.   Musculoskeletal: Negative.   Skin: Negative.   Neurological: Negative.   Endo/Heme/Allergies: Negative.   Psychiatric/Behavioral: Negative.     Family History  Problem Relation Age of Onset  . Heart attack Father   . Heart failure Mother   . Diabetes Mother   . Stroke Maternal Grandmother     Social History   Social History  . Marital status: Married    Spouse name: N/A  . Number of children: N/A  . Years of education: N/A   Occupational History  . 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 Smoking status: Former Smoker    Packs/day: 0.75    Years: 20.00    Types: Pipe, Cigars    Quit date: 09/23/1973  . Smokeless tobacco: Never Used     Comment: tried snuff and chew as a child (29yr) did not continue  . Alcohol use Yes     Comment: 05/22/2015 "seldom-- every three or four years"  . Drug use: No  . Sexual activity: Not Currently   Other Topics Concern  . Not on file   Social History Narrative   ** Merged History Encounter **        Environmental and Social history  Lives in a house with a dry environment, no animals located inside the household, carpeting in the bedroom, plastic on the pillows but not the bed, and no smoking  ongoing with inside the household  Objective:   Vitals:   06/26/16 1411  BP:  130/80  Pulse: 80  Resp: 18  Temp: 98.6 F (37 C)   Height: 5' 6.81" (169.7 cm) Weight: 205 lb 9.6 oz (93.3 kg)  Physical Exam  Constitutional: He is well-developed, well-nourished, and in no distress.  [place]  HENT:  Head: Normocephalic.  Right Ear: Tympanic membrane, external ear and ear canal normal.  Left Ear: Tympanic membrane, external ear and ear canal normal.  Nose: Nose normal. No mucosal edema or rhinorrhea.  Mouth/Throat: Uvula is midline, oropharynx is clear and moist and mucous membranes are normal. No oropharyngeal exudate.  Eyes: Conjunctivae are normal.  Neck: Trachea normal. No tracheal tenderness present. No tracheal deviation present. No thyromegaly present.  Cardiovascular: Normal rate, regular rhythm, S1 normal, S2 normal and normal heart sounds.   No murmur heard. Pulmonary/Chest: Breath sounds normal. No stridor. No respiratory distress. He has no wheezes. He has no rales.  Musculoskeletal: He exhibits no edema.  Bandage left shin and ankle  Lymphadenopathy:       Head (right side): No tonsillar adenopathy present.       Head (left side): No tonsillar adenopathy present.    He has no cervical adenopathy.  Neurological: He is alert. Gait normal.  Skin: Rash (Approximately 1/2 cm Erythematous scaly somewhat ulcerated lesion affecting his right nose, right forehead, and multiple hard scaly half centimeter lesions affecting his forearms bilaterally) noted. He is not diaphoretic. No erythema. Nails show no clubbing.  Psychiatric: Mood and affect normal.    Diagnostics: Allergy skin tests were not performed performed secondary to the recent administration of an antihistamine.   Spirometry was performed and demonstrated an FEV1 of 1.52 @ 74 % of predicted.  Oxygen saturation at rest on room air was 94%   Assessment and Plan:    1. Asthma, not well controlled, moderate persistent, with acute exacerbation   2. Actinic keratosis   3. Gastroesophageal reflux  disease, esophagitis presence not specified     1. Allergen avoidance measures  2. Treat and prevent inflammation:   A. budesonide 0.5 mg nebulized twice a day  B. Brovana nebulized twice a day  3. Treat reflux:   A. continue pantoprazole 40 mg in a.m.  B. start ranitidine 300 mg in PM  4. If needed:   A. DuoNeb nebulized every 6 hours  5. Return to clinic in 3 weeks or earlier if problem  6. Obtain fall flu vaccine  7. Visit with [facility] about skin lesions  There is a multitude of different reasons why Gavin [name] may be having cough and phlegm production including his 20 year history of tobacco abuse which fortunately was discontinued in the 1970s, asthma, reflux-induced respiratory disease, and possible contribution from some of his low level cardiac function. I will have him use a combination of an inhaled steroid and a long-acting bronchodilator to address any respiratory tract inflammation and I'm going to have him be a little bit more aggressive about treating his reflux as he may have a component of reflux-induced respiratory disease contributing to his cough. I will regroup with him in approximately 3 weeks or earlier to assess his response to this approach  Jiles Prows, MD [place] [facility]

## 2016-06-26 NOTE — Patient Instructions (Addendum)
  1. Allergen avoidance measures  2. Treat and prevent inflammation:   A. budesonide 0.5 mg nebulized twice a day  B. Brovana nebulized twice a day  3. Treat reflux:   A. continue pantoprazole 40 mg in a.m.  B. start ranitidine 300 mg in PM  4. If needed:   A. DuoNeb nebulized every 6 hours  5. Return to clinic in 3 weeks or earlier if problem  6. Obtain fall flu vaccine  7. Visit with [facility] Dermatology about skin lesions

## 2016-06-28 ENCOUNTER — Encounter: Payer: Self-pay | Admitting: Allergy and Immunology

## 2016-06-28 DIAGNOSIS — I130 Hypertensive heart and chronic kidney disease with heart failure and stage 1 through stage 4 chronic kidney disease, or unspecified chronic kidney disease: Secondary | ICD-10-CM | POA: Diagnosis not present

## 2016-06-28 DIAGNOSIS — I70202 Unspecified atherosclerosis of native arteries of extremities, left leg: Secondary | ICD-10-CM | POA: Diagnosis not present

## 2016-06-28 DIAGNOSIS — I87312 Chronic venous hypertension (idiopathic) with ulcer of left lower extremity: Secondary | ICD-10-CM | POA: Diagnosis not present

## 2016-06-28 DIAGNOSIS — L03116 Cellulitis of left lower limb: Secondary | ICD-10-CM | POA: Diagnosis not present

## 2016-06-28 DIAGNOSIS — L97322 Non-pressure chronic ulcer of left ankle with fat layer exposed: Secondary | ICD-10-CM | POA: Diagnosis not present

## 2016-06-28 DIAGNOSIS — J189 Pneumonia, unspecified organism: Secondary | ICD-10-CM | POA: Diagnosis not present

## 2016-06-29 DIAGNOSIS — I87312 Chronic venous hypertension (idiopathic) with ulcer of left lower extremity: Secondary | ICD-10-CM | POA: Diagnosis not present

## 2016-06-29 DIAGNOSIS — I130 Hypertensive heart and chronic kidney disease with heart failure and stage 1 through stage 4 chronic kidney disease, or unspecified chronic kidney disease: Secondary | ICD-10-CM | POA: Diagnosis not present

## 2016-06-29 DIAGNOSIS — Z7982 Long term (current) use of aspirin: Secondary | ICD-10-CM | POA: Diagnosis not present

## 2016-06-29 DIAGNOSIS — I70202 Unspecified atherosclerosis of native arteries of extremities, left leg: Secondary | ICD-10-CM | POA: Diagnosis not present

## 2016-06-29 DIAGNOSIS — F419 Anxiety disorder, unspecified: Secondary | ICD-10-CM | POA: Diagnosis not present

## 2016-06-29 DIAGNOSIS — J189 Pneumonia, unspecified organism: Secondary | ICD-10-CM | POA: Diagnosis not present

## 2016-06-29 DIAGNOSIS — L97322 Non-pressure chronic ulcer of left ankle with fat layer exposed: Secondary | ICD-10-CM | POA: Diagnosis not present

## 2016-06-29 DIAGNOSIS — I2510 Atherosclerotic heart disease of native coronary artery without angina pectoris: Secondary | ICD-10-CM | POA: Diagnosis not present

## 2016-06-29 DIAGNOSIS — N189 Chronic kidney disease, unspecified: Secondary | ICD-10-CM | POA: Diagnosis not present

## 2016-06-29 DIAGNOSIS — L03116 Cellulitis of left lower limb: Secondary | ICD-10-CM | POA: Diagnosis not present

## 2016-06-29 DIAGNOSIS — I5022 Chronic systolic (congestive) heart failure: Secondary | ICD-10-CM | POA: Diagnosis not present

## 2016-06-29 DIAGNOSIS — Z792 Long term (current) use of antibiotics: Secondary | ICD-10-CM | POA: Diagnosis not present

## 2016-06-29 DIAGNOSIS — I480 Paroxysmal atrial fibrillation: Secondary | ICD-10-CM | POA: Diagnosis not present

## 2016-06-29 DIAGNOSIS — F0390 Unspecified dementia without behavioral disturbance: Secondary | ICD-10-CM | POA: Diagnosis not present

## 2016-06-29 DIAGNOSIS — I252 Old myocardial infarction: Secondary | ICD-10-CM | POA: Diagnosis not present

## 2016-07-01 DIAGNOSIS — L97222 Non-pressure chronic ulcer of left calf with fat layer exposed: Secondary | ICD-10-CM | POA: Diagnosis not present

## 2016-07-01 DIAGNOSIS — L97822 Non-pressure chronic ulcer of other part of left lower leg with fat layer exposed: Secondary | ICD-10-CM | POA: Diagnosis not present

## 2016-07-01 DIAGNOSIS — I87312 Chronic venous hypertension (idiopathic) with ulcer of left lower extremity: Secondary | ICD-10-CM | POA: Diagnosis not present

## 2016-07-01 DIAGNOSIS — I872 Venous insufficiency (chronic) (peripheral): Secondary | ICD-10-CM | POA: Diagnosis not present

## 2016-07-03 ENCOUNTER — Telehealth: Payer: Self-pay | Admitting: Allergy and Immunology

## 2016-07-03 DIAGNOSIS — L97322 Non-pressure chronic ulcer of left ankle with fat layer exposed: Secondary | ICD-10-CM | POA: Diagnosis not present

## 2016-07-03 DIAGNOSIS — I130 Hypertensive heart and chronic kidney disease with heart failure and stage 1 through stage 4 chronic kidney disease, or unspecified chronic kidney disease: Secondary | ICD-10-CM | POA: Diagnosis not present

## 2016-07-03 DIAGNOSIS — I87312 Chronic venous hypertension (idiopathic) with ulcer of left lower extremity: Secondary | ICD-10-CM | POA: Diagnosis not present

## 2016-07-03 DIAGNOSIS — L03116 Cellulitis of left lower limb: Secondary | ICD-10-CM | POA: Diagnosis not present

## 2016-07-03 DIAGNOSIS — J189 Pneumonia, unspecified organism: Secondary | ICD-10-CM | POA: Diagnosis not present

## 2016-07-03 DIAGNOSIS — I70202 Unspecified atherosclerosis of native arteries of extremities, left leg: Secondary | ICD-10-CM | POA: Diagnosis not present

## 2016-07-03 NOTE — Telephone Encounter (Signed)
Will fax in order for nebulizer compressor to [facility]. Patient informed. Fax number # [phone]

## 2016-07-03 NOTE — Telephone Encounter (Signed)
Gavin [name] thought they had a neb at home but they do not.  Needs a neb.  [facility].

## 2016-07-05 DIAGNOSIS — J189 Pneumonia, unspecified organism: Secondary | ICD-10-CM | POA: Diagnosis not present

## 2016-07-05 DIAGNOSIS — I130 Hypertensive heart and chronic kidney disease with heart failure and stage 1 through stage 4 chronic kidney disease, or unspecified chronic kidney disease: Secondary | ICD-10-CM | POA: Diagnosis not present

## 2016-07-05 DIAGNOSIS — I87312 Chronic venous hypertension (idiopathic) with ulcer of left lower extremity: Secondary | ICD-10-CM | POA: Diagnosis not present

## 2016-07-05 DIAGNOSIS — I70202 Unspecified atherosclerosis of native arteries of extremities, left leg: Secondary | ICD-10-CM | POA: Diagnosis not present

## 2016-07-05 DIAGNOSIS — L03116 Cellulitis of left lower limb: Secondary | ICD-10-CM | POA: Diagnosis not present

## 2016-07-05 DIAGNOSIS — L97322 Non-pressure chronic ulcer of left ankle with fat layer exposed: Secondary | ICD-10-CM | POA: Diagnosis not present

## 2016-07-07 ENCOUNTER — Other Ambulatory Visit: Payer: Self-pay | Admitting: Cardiovascular Disease

## 2016-07-08 DIAGNOSIS — I87332 Chronic venous hypertension (idiopathic) with ulcer and inflammation of left lower extremity: Secondary | ICD-10-CM | POA: Diagnosis not present

## 2016-07-08 DIAGNOSIS — L97922 Non-pressure chronic ulcer of unspecified part of left lower leg with fat layer exposed: Secondary | ICD-10-CM | POA: Diagnosis not present

## 2016-07-08 DIAGNOSIS — L97222 Non-pressure chronic ulcer of left calf with fat layer exposed: Secondary | ICD-10-CM | POA: Diagnosis not present

## 2016-07-08 DIAGNOSIS — I872 Venous insufficiency (chronic) (peripheral): Secondary | ICD-10-CM | POA: Diagnosis not present

## 2016-07-10 DIAGNOSIS — I70202 Unspecified atherosclerosis of native arteries of extremities, left leg: Secondary | ICD-10-CM | POA: Diagnosis not present

## 2016-07-10 DIAGNOSIS — L97322 Non-pressure chronic ulcer of left ankle with fat layer exposed: Secondary | ICD-10-CM | POA: Diagnosis not present

## 2016-07-10 DIAGNOSIS — I130 Hypertensive heart and chronic kidney disease with heart failure and stage 1 through stage 4 chronic kidney disease, or unspecified chronic kidney disease: Secondary | ICD-10-CM | POA: Diagnosis not present

## 2016-07-10 DIAGNOSIS — J189 Pneumonia, unspecified organism: Secondary | ICD-10-CM | POA: Diagnosis not present

## 2016-07-10 DIAGNOSIS — K1121 Acute sialoadenitis: Secondary | ICD-10-CM | POA: Diagnosis not present

## 2016-07-10 DIAGNOSIS — L03116 Cellulitis of left lower limb: Secondary | ICD-10-CM | POA: Diagnosis not present

## 2016-07-10 DIAGNOSIS — I87312 Chronic venous hypertension (idiopathic) with ulcer of left lower extremity: Secondary | ICD-10-CM | POA: Diagnosis not present

## 2016-07-12 DIAGNOSIS — L97322 Non-pressure chronic ulcer of left ankle with fat layer exposed: Secondary | ICD-10-CM | POA: Diagnosis not present

## 2016-07-12 DIAGNOSIS — I70202 Unspecified atherosclerosis of native arteries of extremities, left leg: Secondary | ICD-10-CM | POA: Diagnosis not present

## 2016-07-12 DIAGNOSIS — L03116 Cellulitis of left lower limb: Secondary | ICD-10-CM | POA: Diagnosis not present

## 2016-07-12 DIAGNOSIS — J189 Pneumonia, unspecified organism: Secondary | ICD-10-CM | POA: Diagnosis not present

## 2016-07-12 DIAGNOSIS — I130 Hypertensive heart and chronic kidney disease with heart failure and stage 1 through stage 4 chronic kidney disease, or unspecified chronic kidney disease: Secondary | ICD-10-CM | POA: Diagnosis not present

## 2016-07-12 DIAGNOSIS — I87312 Chronic venous hypertension (idiopathic) with ulcer of left lower extremity: Secondary | ICD-10-CM | POA: Diagnosis not present

## 2016-07-17 ENCOUNTER — Ambulatory Visit (INDEPENDENT_AMBULATORY_CARE_PROVIDER_SITE_OTHER): Payer: Medicare Other | Admitting: Allergy and Immunology

## 2016-07-17 ENCOUNTER — Encounter: Payer: Self-pay | Admitting: Allergy and Immunology

## 2016-07-17 VITALS — BP 130/80 | HR 80 | Resp 22

## 2016-07-17 DIAGNOSIS — I779 Disorder of arteries and arterioles, unspecified: Secondary | ICD-10-CM | POA: Diagnosis not present

## 2016-07-17 DIAGNOSIS — J4540 Moderate persistent asthma, uncomplicated: Secondary | ICD-10-CM

## 2016-07-17 DIAGNOSIS — L570 Actinic keratosis: Secondary | ICD-10-CM | POA: Diagnosis not present

## 2016-07-17 DIAGNOSIS — K219 Gastro-esophageal reflux disease without esophagitis: Secondary | ICD-10-CM | POA: Diagnosis not present

## 2016-07-17 DIAGNOSIS — L578 Other skin changes due to chronic exposure to nonionizing radiation: Secondary | ICD-10-CM | POA: Diagnosis not present

## 2016-07-17 NOTE — Progress Notes (Signed)
Follow-up Note  Referring Provider: Myer Peer, MD Primary Provider: Ann Held, MD Date of Office Visit: 07/17/2016  Subjective:   Gavin [name] (DOB: 01/28/1929) is a 82 y.o. male who returns to the [facility] on 07/17/2016 in re-evaluation of the following:  HPI: Gavin [name] returns to this clinic in reevaluation of his multifactorial form of cough addressed during his initial evaluation of 06/26/2016. Since utilizing medical therapy prescribed with that visit he has eliminated his cough. He no longer has spells of cough. He has no problems with his nose. He has no problems with his throat.  He did visit with [facility] dermatology today and had a biopsy of the lesion on his nose. He was told that he has skin cancer on his nose and his ear.    Medication List      acetaminophen 500 MG tablet Commonly known as:  TYLENOL Take 1,000 mg by mouth every 6 (six) hours as needed for moderate pain.   amLODipine 10 MG tablet Commonly known as:  NORVASC Take 10 mg by mouth daily.   arformoterol 15 MCG/2ML Nebu Commonly known as:  BROVANA Use one dose in nebulizer twice daily to prevent cough or wheeze.   aspirin EC 81 MG tablet Take 81 mg by mouth at bedtime.   atorvastatin 40 MG tablet Commonly known as:  LIPITOR Take 1 tablet (40 mg total) by mouth at bedtime.   budesonide 0.5 MG/2ML nebulizer solution Commonly known as:  PULMICORT Use one vial in nebulizer twice a day to prevent cough or wheeze.   clopidogrel 75 MG tablet Commonly known as:  PLAVIX Take 1 tablet (75 mg total) by mouth daily.   DULoxetine 60 MG capsule Commonly known as:  CYMBALTA Take 60 mg by mouth daily.   fexofenadine 180 MG tablet Commonly known as:  ALLEGRA Take 180 mg by mouth every morning.   furosemide 40 MG tablet Commonly known as:  LASIX   HYDROcodone-acetaminophen 5-325 MG tablet Commonly known as:  NORCO/VICODIN   ipratropium-albuterol 0.5-2.5 (3) MG/3ML  Soln Commonly known as:  DUONEB Can use one vial in nebulizer every four to six hours as needed for cough or wheeze.   isosorbide mononitrate 30 MG 24 hr tablet Commonly known as:  IMDUR Take 30 mg by mouth 2 (two) times daily.   lactose free nutrition Liqd Take 237 mLs by mouth daily.   lidocaine 5 % Commonly known as:  LIDODERM apply 1 patch TO EACH SHOULDER FOR 16 HOURS PER DAY   metolazone 2.5 MG tablet Commonly known as:  ZAROXOLYN Take 2.5 mg by mouth every other day as needed (for fluid). Every other day   metoprolol succinate 50 MG 24 hr tablet Commonly known as:  TOPROL-XL Take 1 tablet (50mg ) by mouth in the morning and 1/2 tablet (25mg ) by mouth in the evening.   mirtazapine 15 MG tablet Commonly known as:  REMERON Take 15 mg by mouth at bedtime.   mometasone 50 MCG/ACT nasal spray Commonly known as:  NASONEX Place 1 spray into the nose 2 (two) times daily.   multivitamin with minerals Tabs tablet Take 1 tablet by mouth daily.   NAMZARIC 28-10 MG Cp24 Generic drug:  Memantine HCl-Donepezil HCl Take 1 mg by mouth.   nitroGLYCERIN 0.4 MG SL tablet Commonly known as:  NITROSTAT Place 0.4 mg under the tongue every 5 (five) minutes as needed for chest pain.   oxybutynin 5 MG tablet Commonly known as:  DITROPAN   pantoprazole 40 MG tablet Commonly known as:  PROTONIX take 1 tablet by mouth once daily   potassium chloride 10 MEQ tablet Commonly known as:  K-DUR   PRESERVISION AREDS PO Take by mouth daily.   PROBIOTIC DAILY PO Take by mouth daily.   RANEXA 500 MG 12 hr tablet Generic drug:  ranolazine take 1 tablet by mouth twice a day   ranitidine 300 MG tablet Commonly known as:  ZANTAC Take 1 tablet (300 mg total) by mouth at bedtime.   STOOL SOFTENER PO Take by mouth.   tamsulosin 0.4 MG Caps capsule Commonly known as:  FLOMAX Take 0.4 mg by mouth daily after supper.   VITAMIN B 12 PO Take 1 tablet by mouth daily.   Vitamin D3 2000  units Tabs Take 1 tablet by mouth.       Past Medical History:  Diagnosis Date  . Allergy   . Anemia   . Arthritis    "all over"  . CAD (coronary artery disease)    a. 2007 STEMI: stent to LAD;  b. multiple PCI's. c. 10/2014 NSTEMI/PCI: LM nl, LAD 173m, D1 50ost, LCX nl/small, RCA 33m (3.0x20 Promus DES), EF 45-50% (this admission is under MR # [id number])  . Carotid disease, bilateral ([place])    left greater than right  . CHF (congestive heart failure) ([place])   . Chronic back pain    "all over"  . Daily headache    negative work up, no change off Imdur  . Depression   . DVT (deep venous thrombosis) ([place]) ~ 1980   "in one of his legs; was in ICU for some time"  . GERD (gastroesophageal reflux disease)   . Hyperlipidemia   . Hypertension   . Lung nodules    CT 07-01-10 stable @ [place]  . Myocardial infarction    "he's had 4; last one was 11/2014" (05/22/2015)  . OSA (obstructive sleep apnea)    NPSG 05-05-99 RDI 65/hr-CPAP  . OSA on CPAP   . PAF (paroxysmal atrial fibrillation) ([place])   . Pneumonia ~ 2013  . PVD (peripheral vascular disease) ([place]) 2004   stenting of both SFA's 2004, critical limb ischemia  . Reflux esophagitis   . Venous ulcer of leg [facility])     Past Surgical History:  Procedure Laterality Date  . APPENDECTOMY    . CARDIAC CATHETERIZATION  10/03/2008   Occluded LAD  . CARDIAC CATHETERIZATION  06/01/13   medical Rx  . CAROTID DOPPLER  05/29/2012   bilateral ICA stenosis  . CATARACT EXTRACTION W/ INTRAOCULAR LENS  IMPLANT, BILATERAL Bilateral 2015  . CORONARY ANGIOPLASTY WITH STENT PLACEMENT  06/13/2004   successful cutting balloon atherectomy/PIi & STENTING mid LAD & POBA of the distal LAD  . CORONARY ANGIOPLASTY WITH STENT PLACEMENT  06/03/2006   successful PTCA & stent mid LAD  . CORONARY ANGIOPLASTY WITH STENT PLACEMENT  06/06/2006   successful double stenting of the ostial and prox RCA, successful PTCA LAD of the ostium major diag branch due to jailing by a  previously placed LAD stent,successful mid kiagonal branch primary stenting  . CORONARY ANGIOPLASTY WITH STENT PLACEMENT  09/20/2008   PTCA LAD stent  . LEFT HEART CATHETERIZATION WITH CORONARY ANGIOGRAM N/A 11/14/2014   Procedure: LEFT HEART CATHETERIZATION WITH CORONARY ANGIOGRAM;  Surgeon: Blane Ohara, MD;  Location: [facility] CATH LAB;  Service: Cardiovascular;  Laterality: N/A;  . LEFT HEART CATHETERIZATION WITH CORONARY ANGIOGRAM N/A 06/01/2013   Procedure: LEFT HEART  CATHETERIZATION WITH CORONARY ANGIOGRAM;  Surgeon: Leonie Man, MD;  Location: [facility] CATH LAB;  Service: Cardiovascular;  Laterality: N/A;  . NM MYOVIEW LTD  08/01/2011   Abnormal-intermed to high risk based upon per-infarct ischemia  . PERIPHERAL VASCULAR CATHETERIZATION N/A 05/22/2015   Procedure: Lower Extremity Angiography;  Surgeon: Lorretta Harp, MD;  Location: [place] CV LAB;  Service: Cardiovascular;  Laterality: N/A;  . SHOULDER OPEN ROTATOR CUFF REPAIR Right 1980's  . TONSILLECTOMY    . US ECHOCARDIOGRAPHY  12/24/2011   trace MR,TR,AI,PI. EF >55%    Allergies  Allergen Reactions  . Ciprofloxacin Other (See Comments)    Unknown allergic reaction, possible shortness of breath  . Codeine Other (See Comments)    "knocked me out"  . Dexamethasone Other (See Comments)    Unknown allergic reaction  . Sulfa Antibiotics Nausea And Vomiting  . Sulfonamide Derivatives     Unknown reaction  . Telmisartan Other (See Comments)    Micardis - heart races    Review of systems negative except as noted in HPI / PMHx or noted below:  Review of Systems  Constitutional: Negative.   HENT: Negative.   Eyes: Negative.   Respiratory: Negative.   Cardiovascular: Negative.   Gastrointestinal: Negative.   Genitourinary: Negative.   Musculoskeletal: Negative.   Skin: Negative.   Neurological: Negative.   Endo/Heme/Allergies: Negative.   Psychiatric/Behavioral: Negative.      Objective:   Vitals:   07/17/16 1508    BP: 130/80  Pulse: 80  Resp: (!) 22          Physical Exam  Constitutional: He is well-developed, well-nourished, and in no distress.  HENT:  Head: Normocephalic.  Right Ear: Tympanic membrane, external ear and ear canal normal.  Left Ear: Tympanic membrane, external ear and ear canal normal.  Nose: Nose normal. No mucosal edema or rhinorrhea.  Mouth/Throat: Uvula is midline, oropharynx is clear and moist and mucous membranes are normal. No oropharyngeal exudate.  Eyes: Conjunctivae are normal.  Neck: Trachea normal. No tracheal tenderness present. No tracheal deviation present. No thyromegaly present.  Cardiovascular: Normal rate, regular rhythm, S1 normal, S2 normal and normal heart sounds.   No murmur heard. Pulmonary/Chest: Breath sounds normal. No stridor. No respiratory distress. He has no wheezes. He has no rales.  Musculoskeletal: He exhibits no edema.  Bandages left shin  Lymphadenopathy:       Head (right side): No tonsillar adenopathy present.       Head (left side): No tonsillar adenopathy present.    He has no cervical adenopathy.  Neurological: He is alert. Gait normal.  Skin: Rash (Healing crater right nasal area) noted. He is not diaphoretic. No erythema. Nails show no clubbing.  Psychiatric: Mood and affect normal.    Diagnostics:    Spirometry was performed and demonstrated an FEV1 of 1.45 at 64 % of predicted.  Assessment and Plan:   1. Asthma, moderate persistent, well-controlled   2. Gastroesophageal reflux disease, esophagitis presence not specified      1. Allergen avoidance measures?  2. Continue to Treat and prevent inflammation:   A. budesonide 0.5 mg nebulized twice a day  B. Brovana nebulized twice a day  3. Continue to Treat reflux:   A. pantoprazole 40 mg in a.m.  B. ranitidine 300 mg in PM  4. If needed:   A. DuoNeb nebulized every 6 hours  5. Return to clinic in 12 weeks or earlier if problem  6. Obtain fall flu  vaccine  Gavin [name] is doing much better and he does appear to have resolved all his respiratory tract symptoms on his current plan. This is the best that he has felt regarding his respiratory tract throughout this year. I'm going to continue to have him use anti-inflammatory medications for his respiratory tract and therapy for reflux as stated above and regroup with him in 12 weeks or earlier if there is a problem.  Allena Katz, MD [place]

## 2016-07-17 NOTE — Patient Instructions (Addendum)
  1. Allergen avoidance measures?  2. Continue to Treat and prevent inflammation:   A. budesonide 0.5 mg nebulized twice a day  B. Brovana nebulized twice a day  3. Continue to Treat reflux:   A. pantoprazole 40 mg in a.m.  B. ranitidine 300 mg in PM  4. If needed:   A. DuoNeb nebulized every 6 hours  5. Return to clinic in 12 weeks or earlier if problem  6. Obtain fall flu vaccine

## 2016-07-18 DIAGNOSIS — L97322 Non-pressure chronic ulcer of left ankle with fat layer exposed: Secondary | ICD-10-CM | POA: Diagnosis not present

## 2016-07-18 DIAGNOSIS — I130 Hypertensive heart and chronic kidney disease with heart failure and stage 1 through stage 4 chronic kidney disease, or unspecified chronic kidney disease: Secondary | ICD-10-CM | POA: Diagnosis not present

## 2016-07-18 DIAGNOSIS — I70202 Unspecified atherosclerosis of native arteries of extremities, left leg: Secondary | ICD-10-CM | POA: Diagnosis not present

## 2016-07-18 DIAGNOSIS — L03116 Cellulitis of left lower limb: Secondary | ICD-10-CM | POA: Diagnosis not present

## 2016-07-18 DIAGNOSIS — I87312 Chronic venous hypertension (idiopathic) with ulcer of left lower extremity: Secondary | ICD-10-CM | POA: Diagnosis not present

## 2016-07-18 DIAGNOSIS — J189 Pneumonia, unspecified organism: Secondary | ICD-10-CM | POA: Diagnosis not present

## 2016-07-19 DIAGNOSIS — L97822 Non-pressure chronic ulcer of other part of left lower leg with fat layer exposed: Secondary | ICD-10-CM | POA: Diagnosis not present

## 2016-07-19 DIAGNOSIS — I872 Venous insufficiency (chronic) (peripheral): Secondary | ICD-10-CM | POA: Diagnosis not present

## 2016-07-19 DIAGNOSIS — I87312 Chronic venous hypertension (idiopathic) with ulcer of left lower extremity: Secondary | ICD-10-CM | POA: Diagnosis not present

## 2016-07-19 DIAGNOSIS — L97821 Non-pressure chronic ulcer of other part of left lower leg limited to breakdown of skin: Secondary | ICD-10-CM | POA: Diagnosis not present

## 2016-07-22 DIAGNOSIS — I70202 Unspecified atherosclerosis of native arteries of extremities, left leg: Secondary | ICD-10-CM | POA: Diagnosis not present

## 2016-07-22 DIAGNOSIS — L97322 Non-pressure chronic ulcer of left ankle with fat layer exposed: Secondary | ICD-10-CM | POA: Diagnosis not present

## 2016-07-22 DIAGNOSIS — I87312 Chronic venous hypertension (idiopathic) with ulcer of left lower extremity: Secondary | ICD-10-CM | POA: Diagnosis not present

## 2016-07-22 DIAGNOSIS — L03116 Cellulitis of left lower limb: Secondary | ICD-10-CM | POA: Diagnosis not present

## 2016-07-22 DIAGNOSIS — I130 Hypertensive heart and chronic kidney disease with heart failure and stage 1 through stage 4 chronic kidney disease, or unspecified chronic kidney disease: Secondary | ICD-10-CM | POA: Diagnosis not present

## 2016-07-22 DIAGNOSIS — J189 Pneumonia, unspecified organism: Secondary | ICD-10-CM | POA: Diagnosis not present

## 2016-07-25 DIAGNOSIS — L97821 Non-pressure chronic ulcer of other part of left lower leg limited to breakdown of skin: Secondary | ICD-10-CM | POA: Diagnosis not present

## 2016-07-25 DIAGNOSIS — L97222 Non-pressure chronic ulcer of left calf with fat layer exposed: Secondary | ICD-10-CM | POA: Diagnosis not present

## 2016-07-25 DIAGNOSIS — I872 Venous insufficiency (chronic) (peripheral): Secondary | ICD-10-CM | POA: Diagnosis not present

## 2016-07-26 DIAGNOSIS — L97322 Non-pressure chronic ulcer of left ankle with fat layer exposed: Secondary | ICD-10-CM | POA: Diagnosis not present

## 2016-07-26 DIAGNOSIS — I87312 Chronic venous hypertension (idiopathic) with ulcer of left lower extremity: Secondary | ICD-10-CM | POA: Diagnosis not present

## 2016-07-26 DIAGNOSIS — J189 Pneumonia, unspecified organism: Secondary | ICD-10-CM | POA: Diagnosis not present

## 2016-07-26 DIAGNOSIS — I130 Hypertensive heart and chronic kidney disease with heart failure and stage 1 through stage 4 chronic kidney disease, or unspecified chronic kidney disease: Secondary | ICD-10-CM | POA: Diagnosis not present

## 2016-07-26 DIAGNOSIS — L03116 Cellulitis of left lower limb: Secondary | ICD-10-CM | POA: Diagnosis not present

## 2016-07-26 DIAGNOSIS — I70202 Unspecified atherosclerosis of native arteries of extremities, left leg: Secondary | ICD-10-CM | POA: Diagnosis not present

## 2016-07-29 DIAGNOSIS — L97322 Non-pressure chronic ulcer of left ankle with fat layer exposed: Secondary | ICD-10-CM | POA: Diagnosis not present

## 2016-07-29 DIAGNOSIS — I87312 Chronic venous hypertension (idiopathic) with ulcer of left lower extremity: Secondary | ICD-10-CM | POA: Diagnosis not present

## 2016-07-29 DIAGNOSIS — L03116 Cellulitis of left lower limb: Secondary | ICD-10-CM | POA: Diagnosis not present

## 2016-07-29 DIAGNOSIS — I130 Hypertensive heart and chronic kidney disease with heart failure and stage 1 through stage 4 chronic kidney disease, or unspecified chronic kidney disease: Secondary | ICD-10-CM | POA: Diagnosis not present

## 2016-07-29 DIAGNOSIS — I70202 Unspecified atherosclerosis of native arteries of extremities, left leg: Secondary | ICD-10-CM | POA: Diagnosis not present

## 2016-07-29 DIAGNOSIS — J189 Pneumonia, unspecified organism: Secondary | ICD-10-CM | POA: Diagnosis not present

## 2016-08-01 DIAGNOSIS — L97222 Non-pressure chronic ulcer of left calf with fat layer exposed: Secondary | ICD-10-CM | POA: Diagnosis not present

## 2016-08-01 DIAGNOSIS — L97822 Non-pressure chronic ulcer of other part of left lower leg with fat layer exposed: Secondary | ICD-10-CM | POA: Diagnosis not present

## 2016-08-01 DIAGNOSIS — I872 Venous insufficiency (chronic) (peripheral): Secondary | ICD-10-CM | POA: Diagnosis not present

## 2016-08-01 DIAGNOSIS — I87332 Chronic venous hypertension (idiopathic) with ulcer and inflammation of left lower extremity: Secondary | ICD-10-CM | POA: Diagnosis not present

## 2016-08-02 DIAGNOSIS — I70202 Unspecified atherosclerosis of native arteries of extremities, left leg: Secondary | ICD-10-CM | POA: Diagnosis not present

## 2016-08-02 DIAGNOSIS — L03116 Cellulitis of left lower limb: Secondary | ICD-10-CM | POA: Diagnosis not present

## 2016-08-02 DIAGNOSIS — I130 Hypertensive heart and chronic kidney disease with heart failure and stage 1 through stage 4 chronic kidney disease, or unspecified chronic kidney disease: Secondary | ICD-10-CM | POA: Diagnosis not present

## 2016-08-02 DIAGNOSIS — L97322 Non-pressure chronic ulcer of left ankle with fat layer exposed: Secondary | ICD-10-CM | POA: Diagnosis not present

## 2016-08-02 DIAGNOSIS — J189 Pneumonia, unspecified organism: Secondary | ICD-10-CM | POA: Diagnosis not present

## 2016-08-02 DIAGNOSIS — I87312 Chronic venous hypertension (idiopathic) with ulcer of left lower extremity: Secondary | ICD-10-CM | POA: Diagnosis not present

## 2016-08-05 DIAGNOSIS — I70202 Unspecified atherosclerosis of native arteries of extremities, left leg: Secondary | ICD-10-CM | POA: Diagnosis not present

## 2016-08-05 DIAGNOSIS — J189 Pneumonia, unspecified organism: Secondary | ICD-10-CM | POA: Diagnosis not present

## 2016-08-05 DIAGNOSIS — I87312 Chronic venous hypertension (idiopathic) with ulcer of left lower extremity: Secondary | ICD-10-CM | POA: Diagnosis not present

## 2016-08-05 DIAGNOSIS — L97322 Non-pressure chronic ulcer of left ankle with fat layer exposed: Secondary | ICD-10-CM | POA: Diagnosis not present

## 2016-08-05 DIAGNOSIS — I130 Hypertensive heart and chronic kidney disease with heart failure and stage 1 through stage 4 chronic kidney disease, or unspecified chronic kidney disease: Secondary | ICD-10-CM | POA: Diagnosis not present

## 2016-08-05 DIAGNOSIS — L03116 Cellulitis of left lower limb: Secondary | ICD-10-CM | POA: Diagnosis not present

## 2016-08-05 DIAGNOSIS — I951 Orthostatic hypotension: Secondary | ICD-10-CM | POA: Diagnosis not present

## 2016-08-07 DIAGNOSIS — J189 Pneumonia, unspecified organism: Secondary | ICD-10-CM | POA: Diagnosis not present

## 2016-08-07 DIAGNOSIS — L97322 Non-pressure chronic ulcer of left ankle with fat layer exposed: Secondary | ICD-10-CM | POA: Diagnosis not present

## 2016-08-07 DIAGNOSIS — I130 Hypertensive heart and chronic kidney disease with heart failure and stage 1 through stage 4 chronic kidney disease, or unspecified chronic kidney disease: Secondary | ICD-10-CM | POA: Diagnosis not present

## 2016-08-07 DIAGNOSIS — I87312 Chronic venous hypertension (idiopathic) with ulcer of left lower extremity: Secondary | ICD-10-CM | POA: Diagnosis not present

## 2016-08-07 DIAGNOSIS — L03116 Cellulitis of left lower limb: Secondary | ICD-10-CM | POA: Diagnosis not present

## 2016-08-07 DIAGNOSIS — I872 Venous insufficiency (chronic) (peripheral): Secondary | ICD-10-CM | POA: Diagnosis not present

## 2016-08-07 DIAGNOSIS — L97821 Non-pressure chronic ulcer of other part of left lower leg limited to breakdown of skin: Secondary | ICD-10-CM | POA: Diagnosis not present

## 2016-08-07 DIAGNOSIS — I70202 Unspecified atherosclerosis of native arteries of extremities, left leg: Secondary | ICD-10-CM | POA: Diagnosis not present

## 2016-08-07 DIAGNOSIS — L97222 Non-pressure chronic ulcer of left calf with fat layer exposed: Secondary | ICD-10-CM | POA: Diagnosis not present

## 2016-08-09 DIAGNOSIS — L97322 Non-pressure chronic ulcer of left ankle with fat layer exposed: Secondary | ICD-10-CM | POA: Diagnosis not present

## 2016-08-09 DIAGNOSIS — L03116 Cellulitis of left lower limb: Secondary | ICD-10-CM | POA: Diagnosis not present

## 2016-08-09 DIAGNOSIS — I87312 Chronic venous hypertension (idiopathic) with ulcer of left lower extremity: Secondary | ICD-10-CM | POA: Diagnosis not present

## 2016-08-09 DIAGNOSIS — I130 Hypertensive heart and chronic kidney disease with heart failure and stage 1 through stage 4 chronic kidney disease, or unspecified chronic kidney disease: Secondary | ICD-10-CM | POA: Diagnosis not present

## 2016-08-09 DIAGNOSIS — J189 Pneumonia, unspecified organism: Secondary | ICD-10-CM | POA: Diagnosis not present

## 2016-08-09 DIAGNOSIS — I70202 Unspecified atherosclerosis of native arteries of extremities, left leg: Secondary | ICD-10-CM | POA: Diagnosis not present

## 2016-08-12 DIAGNOSIS — I70202 Unspecified atherosclerosis of native arteries of extremities, left leg: Secondary | ICD-10-CM | POA: Diagnosis not present

## 2016-08-12 DIAGNOSIS — I87312 Chronic venous hypertension (idiopathic) with ulcer of left lower extremity: Secondary | ICD-10-CM | POA: Diagnosis not present

## 2016-08-12 DIAGNOSIS — I130 Hypertensive heart and chronic kidney disease with heart failure and stage 1 through stage 4 chronic kidney disease, or unspecified chronic kidney disease: Secondary | ICD-10-CM | POA: Diagnosis not present

## 2016-08-12 DIAGNOSIS — L97322 Non-pressure chronic ulcer of left ankle with fat layer exposed: Secondary | ICD-10-CM | POA: Diagnosis not present

## 2016-08-12 DIAGNOSIS — L03116 Cellulitis of left lower limb: Secondary | ICD-10-CM | POA: Diagnosis not present

## 2016-08-12 DIAGNOSIS — J189 Pneumonia, unspecified organism: Secondary | ICD-10-CM | POA: Diagnosis not present

## 2016-08-14 DIAGNOSIS — L97222 Non-pressure chronic ulcer of left calf with fat layer exposed: Secondary | ICD-10-CM | POA: Diagnosis not present

## 2016-08-14 DIAGNOSIS — I872 Venous insufficiency (chronic) (peripheral): Secondary | ICD-10-CM | POA: Diagnosis not present

## 2016-08-14 DIAGNOSIS — L97821 Non-pressure chronic ulcer of other part of left lower leg limited to breakdown of skin: Secondary | ICD-10-CM | POA: Diagnosis not present

## 2016-08-16 DIAGNOSIS — I87312 Chronic venous hypertension (idiopathic) with ulcer of left lower extremity: Secondary | ICD-10-CM | POA: Diagnosis not present

## 2016-08-16 DIAGNOSIS — L97322 Non-pressure chronic ulcer of left ankle with fat layer exposed: Secondary | ICD-10-CM | POA: Diagnosis not present

## 2016-08-16 DIAGNOSIS — I130 Hypertensive heart and chronic kidney disease with heart failure and stage 1 through stage 4 chronic kidney disease, or unspecified chronic kidney disease: Secondary | ICD-10-CM | POA: Diagnosis not present

## 2016-08-16 DIAGNOSIS — J189 Pneumonia, unspecified organism: Secondary | ICD-10-CM | POA: Diagnosis not present

## 2016-08-16 DIAGNOSIS — L03116 Cellulitis of left lower limb: Secondary | ICD-10-CM | POA: Diagnosis not present

## 2016-08-16 DIAGNOSIS — I70202 Unspecified atherosclerosis of native arteries of extremities, left leg: Secondary | ICD-10-CM | POA: Diagnosis not present

## 2016-08-19 DIAGNOSIS — I87312 Chronic venous hypertension (idiopathic) with ulcer of left lower extremity: Secondary | ICD-10-CM | POA: Diagnosis not present

## 2016-08-19 DIAGNOSIS — I951 Orthostatic hypotension: Secondary | ICD-10-CM | POA: Diagnosis not present

## 2016-08-19 DIAGNOSIS — M7500 Adhesive capsulitis of unspecified shoulder: Secondary | ICD-10-CM | POA: Diagnosis not present

## 2016-08-19 DIAGNOSIS — J189 Pneumonia, unspecified organism: Secondary | ICD-10-CM | POA: Diagnosis not present

## 2016-08-19 DIAGNOSIS — I70202 Unspecified atherosclerosis of native arteries of extremities, left leg: Secondary | ICD-10-CM | POA: Diagnosis not present

## 2016-08-19 DIAGNOSIS — I130 Hypertensive heart and chronic kidney disease with heart failure and stage 1 through stage 4 chronic kidney disease, or unspecified chronic kidney disease: Secondary | ICD-10-CM | POA: Diagnosis not present

## 2016-08-19 DIAGNOSIS — L97322 Non-pressure chronic ulcer of left ankle with fat layer exposed: Secondary | ICD-10-CM | POA: Diagnosis not present

## 2016-08-19 DIAGNOSIS — L03116 Cellulitis of left lower limb: Secondary | ICD-10-CM | POA: Diagnosis not present

## 2016-08-21 DIAGNOSIS — L97222 Non-pressure chronic ulcer of left calf with fat layer exposed: Secondary | ICD-10-CM | POA: Diagnosis not present

## 2016-08-21 DIAGNOSIS — I872 Venous insufficiency (chronic) (peripheral): Secondary | ICD-10-CM | POA: Diagnosis not present

## 2016-08-21 DIAGNOSIS — L97822 Non-pressure chronic ulcer of other part of left lower leg with fat layer exposed: Secondary | ICD-10-CM | POA: Diagnosis not present

## 2016-08-23 DIAGNOSIS — L97322 Non-pressure chronic ulcer of left ankle with fat layer exposed: Secondary | ICD-10-CM | POA: Diagnosis not present

## 2016-08-23 DIAGNOSIS — J189 Pneumonia, unspecified organism: Secondary | ICD-10-CM | POA: Diagnosis not present

## 2016-08-23 DIAGNOSIS — I87312 Chronic venous hypertension (idiopathic) with ulcer of left lower extremity: Secondary | ICD-10-CM | POA: Diagnosis not present

## 2016-08-23 DIAGNOSIS — I70202 Unspecified atherosclerosis of native arteries of extremities, left leg: Secondary | ICD-10-CM | POA: Diagnosis not present

## 2016-08-23 DIAGNOSIS — L03116 Cellulitis of left lower limb: Secondary | ICD-10-CM | POA: Diagnosis not present

## 2016-08-23 DIAGNOSIS — I130 Hypertensive heart and chronic kidney disease with heart failure and stage 1 through stage 4 chronic kidney disease, or unspecified chronic kidney disease: Secondary | ICD-10-CM | POA: Diagnosis not present

## 2016-08-26 DIAGNOSIS — I87312 Chronic venous hypertension (idiopathic) with ulcer of left lower extremity: Secondary | ICD-10-CM | POA: Diagnosis not present

## 2016-08-26 DIAGNOSIS — I130 Hypertensive heart and chronic kidney disease with heart failure and stage 1 through stage 4 chronic kidney disease, or unspecified chronic kidney disease: Secondary | ICD-10-CM | POA: Diagnosis not present

## 2016-08-26 DIAGNOSIS — L03116 Cellulitis of left lower limb: Secondary | ICD-10-CM | POA: Diagnosis not present

## 2016-08-26 DIAGNOSIS — L97322 Non-pressure chronic ulcer of left ankle with fat layer exposed: Secondary | ICD-10-CM | POA: Diagnosis not present

## 2016-08-26 DIAGNOSIS — I70202 Unspecified atherosclerosis of native arteries of extremities, left leg: Secondary | ICD-10-CM | POA: Diagnosis not present

## 2016-08-26 DIAGNOSIS — J189 Pneumonia, unspecified organism: Secondary | ICD-10-CM | POA: Diagnosis not present

## 2016-08-28 DIAGNOSIS — Z792 Long term (current) use of antibiotics: Secondary | ICD-10-CM | POA: Diagnosis not present

## 2016-08-28 DIAGNOSIS — J189 Pneumonia, unspecified organism: Secondary | ICD-10-CM | POA: Diagnosis not present

## 2016-08-28 DIAGNOSIS — I2510 Atherosclerotic heart disease of native coronary artery without angina pectoris: Secondary | ICD-10-CM | POA: Diagnosis not present

## 2016-08-28 DIAGNOSIS — I70202 Unspecified atherosclerosis of native arteries of extremities, left leg: Secondary | ICD-10-CM | POA: Diagnosis not present

## 2016-08-28 DIAGNOSIS — L97222 Non-pressure chronic ulcer of left calf with fat layer exposed: Secondary | ICD-10-CM | POA: Diagnosis not present

## 2016-08-28 DIAGNOSIS — F0390 Unspecified dementia without behavioral disturbance: Secondary | ICD-10-CM | POA: Diagnosis not present

## 2016-08-28 DIAGNOSIS — L97322 Non-pressure chronic ulcer of left ankle with fat layer exposed: Secondary | ICD-10-CM | POA: Diagnosis not present

## 2016-08-28 DIAGNOSIS — I252 Old myocardial infarction: Secondary | ICD-10-CM | POA: Diagnosis not present

## 2016-08-28 DIAGNOSIS — I130 Hypertensive heart and chronic kidney disease with heart failure and stage 1 through stage 4 chronic kidney disease, or unspecified chronic kidney disease: Secondary | ICD-10-CM | POA: Diagnosis not present

## 2016-08-28 DIAGNOSIS — L03116 Cellulitis of left lower limb: Secondary | ICD-10-CM | POA: Diagnosis not present

## 2016-08-28 DIAGNOSIS — I5022 Chronic systolic (congestive) heart failure: Secondary | ICD-10-CM | POA: Diagnosis not present

## 2016-08-28 DIAGNOSIS — N189 Chronic kidney disease, unspecified: Secondary | ICD-10-CM | POA: Diagnosis not present

## 2016-08-28 DIAGNOSIS — I480 Paroxysmal atrial fibrillation: Secondary | ICD-10-CM | POA: Diagnosis not present

## 2016-08-28 DIAGNOSIS — I872 Venous insufficiency (chronic) (peripheral): Secondary | ICD-10-CM | POA: Diagnosis not present

## 2016-08-28 DIAGNOSIS — I87312 Chronic venous hypertension (idiopathic) with ulcer of left lower extremity: Secondary | ICD-10-CM | POA: Diagnosis not present

## 2016-08-28 DIAGNOSIS — F419 Anxiety disorder, unspecified: Secondary | ICD-10-CM | POA: Diagnosis not present

## 2016-08-28 DIAGNOSIS — Z7982 Long term (current) use of aspirin: Secondary | ICD-10-CM | POA: Diagnosis not present

## 2016-08-30 DIAGNOSIS — I130 Hypertensive heart and chronic kidney disease with heart failure and stage 1 through stage 4 chronic kidney disease, or unspecified chronic kidney disease: Secondary | ICD-10-CM | POA: Diagnosis not present

## 2016-08-30 DIAGNOSIS — L97322 Non-pressure chronic ulcer of left ankle with fat layer exposed: Secondary | ICD-10-CM | POA: Diagnosis not present

## 2016-08-30 DIAGNOSIS — I87312 Chronic venous hypertension (idiopathic) with ulcer of left lower extremity: Secondary | ICD-10-CM | POA: Diagnosis not present

## 2016-08-30 DIAGNOSIS — I70202 Unspecified atherosclerosis of native arteries of extremities, left leg: Secondary | ICD-10-CM | POA: Diagnosis not present

## 2016-08-30 DIAGNOSIS — I5022 Chronic systolic (congestive) heart failure: Secondary | ICD-10-CM | POA: Diagnosis not present

## 2016-08-30 DIAGNOSIS — N189 Chronic kidney disease, unspecified: Secondary | ICD-10-CM | POA: Diagnosis not present

## 2016-09-02 DIAGNOSIS — N189 Chronic kidney disease, unspecified: Secondary | ICD-10-CM | POA: Diagnosis not present

## 2016-09-02 DIAGNOSIS — I87312 Chronic venous hypertension (idiopathic) with ulcer of left lower extremity: Secondary | ICD-10-CM | POA: Diagnosis not present

## 2016-09-02 DIAGNOSIS — L97322 Non-pressure chronic ulcer of left ankle with fat layer exposed: Secondary | ICD-10-CM | POA: Diagnosis not present

## 2016-09-02 DIAGNOSIS — I130 Hypertensive heart and chronic kidney disease with heart failure and stage 1 through stage 4 chronic kidney disease, or unspecified chronic kidney disease: Secondary | ICD-10-CM | POA: Diagnosis not present

## 2016-09-02 DIAGNOSIS — I70202 Unspecified atherosclerosis of native arteries of extremities, left leg: Secondary | ICD-10-CM | POA: Diagnosis not present

## 2016-09-02 DIAGNOSIS — I5022 Chronic systolic (congestive) heart failure: Secondary | ICD-10-CM | POA: Diagnosis not present

## 2016-09-04 DIAGNOSIS — L97222 Non-pressure chronic ulcer of left calf with fat layer exposed: Secondary | ICD-10-CM | POA: Diagnosis not present

## 2016-09-04 DIAGNOSIS — I87312 Chronic venous hypertension (idiopathic) with ulcer of left lower extremity: Secondary | ICD-10-CM | POA: Diagnosis not present

## 2016-09-04 DIAGNOSIS — L97821 Non-pressure chronic ulcer of other part of left lower leg limited to breakdown of skin: Secondary | ICD-10-CM | POA: Diagnosis not present

## 2016-09-04 DIAGNOSIS — I872 Venous insufficiency (chronic) (peripheral): Secondary | ICD-10-CM | POA: Diagnosis not present

## 2016-09-06 DIAGNOSIS — I5022 Chronic systolic (congestive) heart failure: Secondary | ICD-10-CM | POA: Diagnosis not present

## 2016-09-06 DIAGNOSIS — I70202 Unspecified atherosclerosis of native arteries of extremities, left leg: Secondary | ICD-10-CM | POA: Diagnosis not present

## 2016-09-06 DIAGNOSIS — I87312 Chronic venous hypertension (idiopathic) with ulcer of left lower extremity: Secondary | ICD-10-CM | POA: Diagnosis not present

## 2016-09-06 DIAGNOSIS — L97322 Non-pressure chronic ulcer of left ankle with fat layer exposed: Secondary | ICD-10-CM | POA: Diagnosis not present

## 2016-09-06 DIAGNOSIS — N189 Chronic kidney disease, unspecified: Secondary | ICD-10-CM | POA: Diagnosis not present

## 2016-09-06 DIAGNOSIS — I130 Hypertensive heart and chronic kidney disease with heart failure and stage 1 through stage 4 chronic kidney disease, or unspecified chronic kidney disease: Secondary | ICD-10-CM | POA: Diagnosis not present

## 2016-09-09 DIAGNOSIS — I5022 Chronic systolic (congestive) heart failure: Secondary | ICD-10-CM | POA: Diagnosis not present

## 2016-09-09 DIAGNOSIS — I87312 Chronic venous hypertension (idiopathic) with ulcer of left lower extremity: Secondary | ICD-10-CM | POA: Diagnosis not present

## 2016-09-09 DIAGNOSIS — N189 Chronic kidney disease, unspecified: Secondary | ICD-10-CM | POA: Diagnosis not present

## 2016-09-09 DIAGNOSIS — I130 Hypertensive heart and chronic kidney disease with heart failure and stage 1 through stage 4 chronic kidney disease, or unspecified chronic kidney disease: Secondary | ICD-10-CM | POA: Diagnosis not present

## 2016-09-09 DIAGNOSIS — L97322 Non-pressure chronic ulcer of left ankle with fat layer exposed: Secondary | ICD-10-CM | POA: Diagnosis not present

## 2016-09-09 DIAGNOSIS — I70202 Unspecified atherosclerosis of native arteries of extremities, left leg: Secondary | ICD-10-CM | POA: Diagnosis not present

## 2016-09-11 DIAGNOSIS — I509 Heart failure, unspecified: Secondary | ICD-10-CM | POA: Diagnosis not present

## 2016-09-11 DIAGNOSIS — M1990 Unspecified osteoarthritis, unspecified site: Secondary | ICD-10-CM | POA: Diagnosis not present

## 2016-09-11 DIAGNOSIS — L97222 Non-pressure chronic ulcer of left calf with fat layer exposed: Secondary | ICD-10-CM | POA: Diagnosis not present

## 2016-09-11 DIAGNOSIS — F0390 Unspecified dementia without behavioral disturbance: Secondary | ICD-10-CM | POA: Diagnosis not present

## 2016-09-11 DIAGNOSIS — I10 Essential (primary) hypertension: Secondary | ICD-10-CM | POA: Diagnosis not present

## 2016-09-11 DIAGNOSIS — G4730 Sleep apnea, unspecified: Secondary | ICD-10-CM | POA: Diagnosis not present

## 2016-09-11 DIAGNOSIS — I252 Old myocardial infarction: Secondary | ICD-10-CM | POA: Diagnosis not present

## 2016-09-11 DIAGNOSIS — I499 Cardiac arrhythmia, unspecified: Secondary | ICD-10-CM | POA: Diagnosis not present

## 2016-09-11 DIAGNOSIS — D649 Anemia, unspecified: Secondary | ICD-10-CM | POA: Diagnosis not present

## 2016-09-11 DIAGNOSIS — I87312 Chronic venous hypertension (idiopathic) with ulcer of left lower extremity: Secondary | ICD-10-CM | POA: Diagnosis not present

## 2016-09-11 DIAGNOSIS — I872 Venous insufficiency (chronic) (peripheral): Secondary | ICD-10-CM | POA: Diagnosis not present

## 2016-09-11 DIAGNOSIS — I2510 Atherosclerotic heart disease of native coronary artery without angina pectoris: Secondary | ICD-10-CM | POA: Diagnosis not present

## 2016-09-11 DIAGNOSIS — L97221 Non-pressure chronic ulcer of left calf limited to breakdown of skin: Secondary | ICD-10-CM | POA: Diagnosis not present

## 2016-09-13 DIAGNOSIS — N189 Chronic kidney disease, unspecified: Secondary | ICD-10-CM | POA: Diagnosis not present

## 2016-09-13 DIAGNOSIS — I5022 Chronic systolic (congestive) heart failure: Secondary | ICD-10-CM | POA: Diagnosis not present

## 2016-09-13 DIAGNOSIS — I130 Hypertensive heart and chronic kidney disease with heart failure and stage 1 through stage 4 chronic kidney disease, or unspecified chronic kidney disease: Secondary | ICD-10-CM | POA: Diagnosis not present

## 2016-09-13 DIAGNOSIS — L97322 Non-pressure chronic ulcer of left ankle with fat layer exposed: Secondary | ICD-10-CM | POA: Diagnosis not present

## 2016-09-13 DIAGNOSIS — I70202 Unspecified atherosclerosis of native arteries of extremities, left leg: Secondary | ICD-10-CM | POA: Diagnosis not present

## 2016-09-13 DIAGNOSIS — I87312 Chronic venous hypertension (idiopathic) with ulcer of left lower extremity: Secondary | ICD-10-CM | POA: Diagnosis not present

## 2016-09-15 DIAGNOSIS — N189 Chronic kidney disease, unspecified: Secondary | ICD-10-CM | POA: Diagnosis not present

## 2016-09-15 DIAGNOSIS — L97322 Non-pressure chronic ulcer of left ankle with fat layer exposed: Secondary | ICD-10-CM | POA: Diagnosis not present

## 2016-09-15 DIAGNOSIS — I70202 Unspecified atherosclerosis of native arteries of extremities, left leg: Secondary | ICD-10-CM | POA: Diagnosis not present

## 2016-09-15 DIAGNOSIS — I5022 Chronic systolic (congestive) heart failure: Secondary | ICD-10-CM | POA: Diagnosis not present

## 2016-09-15 DIAGNOSIS — I87312 Chronic venous hypertension (idiopathic) with ulcer of left lower extremity: Secondary | ICD-10-CM | POA: Diagnosis not present

## 2016-09-15 DIAGNOSIS — I130 Hypertensive heart and chronic kidney disease with heart failure and stage 1 through stage 4 chronic kidney disease, or unspecified chronic kidney disease: Secondary | ICD-10-CM | POA: Diagnosis not present

## 2016-09-17 DIAGNOSIS — I779 Disorder of arteries and arterioles, unspecified: Secondary | ICD-10-CM | POA: Diagnosis not present

## 2016-09-17 DIAGNOSIS — B351 Tinea unguium: Secondary | ICD-10-CM | POA: Diagnosis not present

## 2016-09-18 DIAGNOSIS — L97222 Non-pressure chronic ulcer of left calf with fat layer exposed: Secondary | ICD-10-CM | POA: Diagnosis not present

## 2016-09-18 DIAGNOSIS — I872 Venous insufficiency (chronic) (peripheral): Secondary | ICD-10-CM | POA: Diagnosis not present

## 2016-09-18 DIAGNOSIS — I87312 Chronic venous hypertension (idiopathic) with ulcer of left lower extremity: Secondary | ICD-10-CM | POA: Diagnosis not present

## 2016-09-20 DIAGNOSIS — I70202 Unspecified atherosclerosis of native arteries of extremities, left leg: Secondary | ICD-10-CM | POA: Diagnosis not present

## 2016-09-20 DIAGNOSIS — N189 Chronic kidney disease, unspecified: Secondary | ICD-10-CM | POA: Diagnosis not present

## 2016-09-20 DIAGNOSIS — I130 Hypertensive heart and chronic kidney disease with heart failure and stage 1 through stage 4 chronic kidney disease, or unspecified chronic kidney disease: Secondary | ICD-10-CM | POA: Diagnosis not present

## 2016-09-20 DIAGNOSIS — L97322 Non-pressure chronic ulcer of left ankle with fat layer exposed: Secondary | ICD-10-CM | POA: Diagnosis not present

## 2016-09-20 DIAGNOSIS — I5022 Chronic systolic (congestive) heart failure: Secondary | ICD-10-CM | POA: Diagnosis not present

## 2016-09-20 DIAGNOSIS — I87312 Chronic venous hypertension (idiopathic) with ulcer of left lower extremity: Secondary | ICD-10-CM | POA: Diagnosis not present

## 2016-09-23 DIAGNOSIS — I130 Hypertensive heart and chronic kidney disease with heart failure and stage 1 through stage 4 chronic kidney disease, or unspecified chronic kidney disease: Secondary | ICD-10-CM | POA: Diagnosis not present

## 2016-09-23 DIAGNOSIS — N189 Chronic kidney disease, unspecified: Secondary | ICD-10-CM | POA: Diagnosis not present

## 2016-09-23 DIAGNOSIS — I87312 Chronic venous hypertension (idiopathic) with ulcer of left lower extremity: Secondary | ICD-10-CM | POA: Diagnosis not present

## 2016-09-23 DIAGNOSIS — L97322 Non-pressure chronic ulcer of left ankle with fat layer exposed: Secondary | ICD-10-CM | POA: Diagnosis not present

## 2016-09-23 DIAGNOSIS — I70202 Unspecified atherosclerosis of native arteries of extremities, left leg: Secondary | ICD-10-CM | POA: Diagnosis not present

## 2016-09-23 DIAGNOSIS — I5022 Chronic systolic (congestive) heart failure: Secondary | ICD-10-CM | POA: Diagnosis not present

## 2016-09-24 DIAGNOSIS — R233 Spontaneous ecchymoses: Secondary | ICD-10-CM | POA: Diagnosis not present

## 2016-09-24 DIAGNOSIS — L570 Actinic keratosis: Secondary | ICD-10-CM | POA: Diagnosis not present

## 2016-09-24 DIAGNOSIS — L578 Other skin changes due to chronic exposure to nonionizing radiation: Secondary | ICD-10-CM | POA: Diagnosis not present

## 2016-09-24 DIAGNOSIS — D485 Neoplasm of uncertain behavior of skin: Secondary | ICD-10-CM | POA: Diagnosis not present

## 2016-09-27 DIAGNOSIS — I5022 Chronic systolic (congestive) heart failure: Secondary | ICD-10-CM | POA: Diagnosis not present

## 2016-09-27 DIAGNOSIS — M25551 Pain in right hip: Secondary | ICD-10-CM | POA: Diagnosis not present

## 2016-09-27 DIAGNOSIS — G8929 Other chronic pain: Secondary | ICD-10-CM | POA: Diagnosis not present

## 2016-09-27 DIAGNOSIS — I70202 Unspecified atherosclerosis of native arteries of extremities, left leg: Secondary | ICD-10-CM | POA: Diagnosis not present

## 2016-09-27 DIAGNOSIS — I130 Hypertensive heart and chronic kidney disease with heart failure and stage 1 through stage 4 chronic kidney disease, or unspecified chronic kidney disease: Secondary | ICD-10-CM | POA: Diagnosis not present

## 2016-09-27 DIAGNOSIS — I87312 Chronic venous hypertension (idiopathic) with ulcer of left lower extremity: Secondary | ICD-10-CM | POA: Diagnosis not present

## 2016-09-27 DIAGNOSIS — N189 Chronic kidney disease, unspecified: Secondary | ICD-10-CM | POA: Diagnosis not present

## 2016-09-27 DIAGNOSIS — L97322 Non-pressure chronic ulcer of left ankle with fat layer exposed: Secondary | ICD-10-CM | POA: Diagnosis not present

## 2016-09-30 DIAGNOSIS — I130 Hypertensive heart and chronic kidney disease with heart failure and stage 1 through stage 4 chronic kidney disease, or unspecified chronic kidney disease: Secondary | ICD-10-CM | POA: Diagnosis not present

## 2016-09-30 DIAGNOSIS — L97322 Non-pressure chronic ulcer of left ankle with fat layer exposed: Secondary | ICD-10-CM | POA: Diagnosis not present

## 2016-09-30 DIAGNOSIS — I87312 Chronic venous hypertension (idiopathic) with ulcer of left lower extremity: Secondary | ICD-10-CM | POA: Diagnosis not present

## 2016-09-30 DIAGNOSIS — I70202 Unspecified atherosclerosis of native arteries of extremities, left leg: Secondary | ICD-10-CM | POA: Diagnosis not present

## 2016-09-30 DIAGNOSIS — I5022 Chronic systolic (congestive) heart failure: Secondary | ICD-10-CM | POA: Diagnosis not present

## 2016-09-30 DIAGNOSIS — N189 Chronic kidney disease, unspecified: Secondary | ICD-10-CM | POA: Diagnosis not present

## 2016-10-02 DIAGNOSIS — L97822 Non-pressure chronic ulcer of other part of left lower leg with fat layer exposed: Secondary | ICD-10-CM | POA: Diagnosis not present

## 2016-10-02 DIAGNOSIS — I872 Venous insufficiency (chronic) (peripheral): Secondary | ICD-10-CM | POA: Diagnosis not present

## 2016-10-02 DIAGNOSIS — L97821 Non-pressure chronic ulcer of other part of left lower leg limited to breakdown of skin: Secondary | ICD-10-CM | POA: Diagnosis not present

## 2016-10-04 DIAGNOSIS — N189 Chronic kidney disease, unspecified: Secondary | ICD-10-CM | POA: Diagnosis not present

## 2016-10-04 DIAGNOSIS — I87312 Chronic venous hypertension (idiopathic) with ulcer of left lower extremity: Secondary | ICD-10-CM | POA: Diagnosis not present

## 2016-10-04 DIAGNOSIS — I5022 Chronic systolic (congestive) heart failure: Secondary | ICD-10-CM | POA: Diagnosis not present

## 2016-10-04 DIAGNOSIS — I70202 Unspecified atherosclerosis of native arteries of extremities, left leg: Secondary | ICD-10-CM | POA: Diagnosis not present

## 2016-10-04 DIAGNOSIS — L97322 Non-pressure chronic ulcer of left ankle with fat layer exposed: Secondary | ICD-10-CM | POA: Diagnosis not present

## 2016-10-04 DIAGNOSIS — N390 Urinary tract infection, site not specified: Secondary | ICD-10-CM | POA: Diagnosis not present

## 2016-10-04 DIAGNOSIS — I130 Hypertensive heart and chronic kidney disease with heart failure and stage 1 through stage 4 chronic kidney disease, or unspecified chronic kidney disease: Secondary | ICD-10-CM | POA: Diagnosis not present

## 2016-10-07 DIAGNOSIS — I87312 Chronic venous hypertension (idiopathic) with ulcer of left lower extremity: Secondary | ICD-10-CM | POA: Diagnosis not present

## 2016-10-07 DIAGNOSIS — I5022 Chronic systolic (congestive) heart failure: Secondary | ICD-10-CM | POA: Diagnosis not present

## 2016-10-07 DIAGNOSIS — L97322 Non-pressure chronic ulcer of left ankle with fat layer exposed: Secondary | ICD-10-CM | POA: Diagnosis not present

## 2016-10-07 DIAGNOSIS — I70202 Unspecified atherosclerosis of native arteries of extremities, left leg: Secondary | ICD-10-CM | POA: Diagnosis not present

## 2016-10-07 DIAGNOSIS — I130 Hypertensive heart and chronic kidney disease with heart failure and stage 1 through stage 4 chronic kidney disease, or unspecified chronic kidney disease: Secondary | ICD-10-CM | POA: Diagnosis not present

## 2016-10-07 DIAGNOSIS — N189 Chronic kidney disease, unspecified: Secondary | ICD-10-CM | POA: Diagnosis not present

## 2016-10-08 DIAGNOSIS — R601 Generalized edema: Secondary | ICD-10-CM | POA: Diagnosis not present

## 2016-10-08 DIAGNOSIS — S90821A Blister (nonthermal), right foot, initial encounter: Secondary | ICD-10-CM | POA: Diagnosis not present

## 2016-10-09 ENCOUNTER — Ambulatory Visit: Payer: Medicare Other | Admitting: Allergy and Immunology

## 2016-10-11 DIAGNOSIS — I87312 Chronic venous hypertension (idiopathic) with ulcer of left lower extremity: Secondary | ICD-10-CM | POA: Diagnosis not present

## 2016-10-11 DIAGNOSIS — I130 Hypertensive heart and chronic kidney disease with heart failure and stage 1 through stage 4 chronic kidney disease, or unspecified chronic kidney disease: Secondary | ICD-10-CM | POA: Diagnosis not present

## 2016-10-11 DIAGNOSIS — N189 Chronic kidney disease, unspecified: Secondary | ICD-10-CM | POA: Diagnosis not present

## 2016-10-11 DIAGNOSIS — I70202 Unspecified atherosclerosis of native arteries of extremities, left leg: Secondary | ICD-10-CM | POA: Diagnosis not present

## 2016-10-11 DIAGNOSIS — I5022 Chronic systolic (congestive) heart failure: Secondary | ICD-10-CM | POA: Diagnosis not present

## 2016-10-11 DIAGNOSIS — L97322 Non-pressure chronic ulcer of left ankle with fat layer exposed: Secondary | ICD-10-CM | POA: Diagnosis not present

## 2016-10-14 ENCOUNTER — Ambulatory Visit (INDEPENDENT_AMBULATORY_CARE_PROVIDER_SITE_OTHER): Payer: Medicare Other | Admitting: Physician Assistant

## 2016-10-14 ENCOUNTER — Encounter: Payer: Self-pay | Admitting: Physician Assistant

## 2016-10-14 VITALS — BP 139/70 | HR 75 | Ht 67.0 in | Wt 200.2 lb

## 2016-10-14 DIAGNOSIS — I480 Paroxysmal atrial fibrillation: Secondary | ICD-10-CM | POA: Diagnosis not present

## 2016-10-14 DIAGNOSIS — I2510 Atherosclerotic heart disease of native coronary artery without angina pectoris: Secondary | ICD-10-CM | POA: Diagnosis not present

## 2016-10-14 DIAGNOSIS — I10 Essential (primary) hypertension: Secondary | ICD-10-CM | POA: Diagnosis not present

## 2016-10-14 DIAGNOSIS — I739 Peripheral vascular disease, unspecified: Secondary | ICD-10-CM | POA: Diagnosis not present

## 2016-10-14 DIAGNOSIS — I130 Hypertensive heart and chronic kidney disease with heart failure and stage 1 through stage 4 chronic kidney disease, or unspecified chronic kidney disease: Secondary | ICD-10-CM | POA: Diagnosis not present

## 2016-10-14 DIAGNOSIS — I70202 Unspecified atherosclerosis of native arteries of extremities, left leg: Secondary | ICD-10-CM | POA: Diagnosis not present

## 2016-10-14 DIAGNOSIS — N189 Chronic kidney disease, unspecified: Secondary | ICD-10-CM | POA: Diagnosis not present

## 2016-10-14 DIAGNOSIS — Z9861 Coronary angioplasty status: Secondary | ICD-10-CM

## 2016-10-14 DIAGNOSIS — I5022 Chronic systolic (congestive) heart failure: Secondary | ICD-10-CM | POA: Diagnosis not present

## 2016-10-14 DIAGNOSIS — L97322 Non-pressure chronic ulcer of left ankle with fat layer exposed: Secondary | ICD-10-CM | POA: Diagnosis not present

## 2016-10-14 DIAGNOSIS — I87312 Chronic venous hypertension (idiopathic) with ulcer of left lower extremity: Secondary | ICD-10-CM | POA: Diagnosis not present

## 2016-10-14 NOTE — Patient Instructions (Addendum)
Medication Instructions:  START taking two extra strength tylenol (1000mg ) two times a day with a meal.    Labwork: NONE  Testing/Procedures: NONE  Follow-Up: Your physician recommends that you schedule a follow-up appointment in: Follow up as recommended in July with Dr. [name]   Any Other Special Instructions Will Be Listed Below (If Applicable).  Walk a lap around your house 3 times a day, increase as tolerated.   If you need a refill on your cardiac medications before your next appointment, please call your pharmacy.

## 2016-10-14 NOTE — Progress Notes (Signed)
Cardiology Office Note   Date:  10/14/2016   ID:  Gavin [name], DOB 02-11-1929, MRN [id number]  PCP:  Ann Held, MD  Cardiologist:  Dr [name] 04/16/2016 Rosaria Ferries, PA-C   History of Present Illness: Gavin [name] is a 83 y.o. male with a history of STEMI w/ LAD stent 2007>occluded 2016 cath w/ DES RCA and otherwise non-obs dz, EF initially 30%>normal 2013, PAF not on coumadin 2nd fall hx, PAD w/ bilat SFA stents, carotid dz 50-69% 2013, HTN, HLD, OSA on CPAP. Chronic HA, chronic DOE. CHAD2DS2VASc=5 (age x 2, CAD, HTN, CHF)  [name] presents for cardiology evaluation. He is here today with his wife.   He complains about his headaches, says they are worse recently, have never been treated.   He cannot sleep at night, not due to breathing issues, unclear cause. He is having memory problems, he has dementia.  His R hand was swollen and he had to lift it with his other hand. That started last week. Than has gradually improved.   He has pain in his R thigh that has gone into his groin. It is much harder to lift his R leg than his L. This started about 2 months ago.  He has a venous ulcer that has not healed in over a year. It is not infected, is getting smaller in minimal increments. He had placental treatment that helped, but it was removed. This is followed by the [facility].  He is breathing pretty well. Legs don't well as long as he elevates them. He is compliant with the Lasix 40 mg qd, rarely has to take the metolazone. He has DOE, wife feels this is severe but there has been no recent change.  His weight is down 5 lbs, wife believes po intake decreased because he sleeps till noon and only eats 2 meals/day. He drinks Boost daily, during the middle of the afternoon. However, he eats candy in the evening and ice cream at bedtime.  He used to walk around the house 5 times, 3 x day. However, his arthritis got bad and he has quit doing that. Since then, he has  gotten weaker.    Past Medical History:  Diagnosis Date  . Allergy   . Anemia   . Arthritis    "all over"  . CAD (coronary artery disease)    a. 2007 STEMI: stent to LAD;  b. multiple PCI's. c. 10/2014 NSTEMI/PCI: LM nl, LAD 137m, D1 50ost, LCX nl/small, RCA 44m (3.0x20 Promus DES), EF 45-50% (this admission is under MR # [id number])  . Carotid disease, bilateral ([place])    left greater than right  . CHF (congestive heart failure) ([place])   . Chronic back pain    "all over"  . Daily headache    negative work up, no change off Imdur  . Depression   . DVT (deep venous thrombosis) ([place]) ~ 1980   "in one of his legs; was in ICU for some time"  . GERD (gastroesophageal reflux disease)   . Hyperlipidemia   . Hypertension   . Lung nodules    CT 07-01-10 stable @ [facility]  . Myocardial infarction    "he's had 4; last one was 11/2014" (05/22/2015)  . OSA (obstructive sleep apnea)    NPSG 05-05-99 RDI 65/hr-CPAP  . OSA on CPAP   . PAF (paroxysmal atrial fibrillation) ([place])   . Pneumonia ~ 2013  . PVD (peripheral vascular disease) ([place]) 2004  stenting of both SFA's 2004, critical limb ischemia  . Reflux esophagitis   . Venous ulcer of leg [facility])     Past Surgical History:  Procedure Laterality Date  . APPENDECTOMY    . CARDIAC CATHETERIZATION  10/03/2008   Occluded LAD  . CARDIAC CATHETERIZATION  06/01/13   medical Rx  . CAROTID DOPPLER  05/29/2012   bilateral ICA stenosis  . CATARACT EXTRACTION W/ INTRAOCULAR LENS  IMPLANT, BILATERAL Bilateral 2015  . CORONARY ANGIOPLASTY WITH STENT PLACEMENT  06/13/2004   successful cutting balloon atherectomy/PIi & STENTING mid LAD & POBA of the distal LAD  . CORONARY ANGIOPLASTY WITH STENT PLACEMENT  06/03/2006   successful PTCA & stent mid LAD  . CORONARY ANGIOPLASTY WITH STENT PLACEMENT  06/06/2006   successful double stenting of the ostial and prox RCA, successful PTCA LAD of the ostium major diag branch due to jailing by a previously placed LAD  stent,successful mid kiagonal branch primary stenting  . CORONARY ANGIOPLASTY WITH STENT PLACEMENT  09/20/2008   PTCA LAD stent  . LEFT HEART CATHETERIZATION WITH CORONARY ANGIOGRAM N/A 11/14/2014   Procedure: LEFT HEART CATHETERIZATION WITH CORONARY ANGIOGRAM;  Surgeon: Blane Ohara, MD;  Location: [facility] CATH LAB;  Service: Cardiovascular;  Laterality: N/A;  . LEFT HEART CATHETERIZATION WITH CORONARY ANGIOGRAM N/A 06/01/2013   Procedure: LEFT HEART CATHETERIZATION WITH CORONARY ANGIOGRAM;  Surgeon: Leonie Man, MD;  Location: [facility] CATH LAB;  Service: Cardiovascular;  Laterality: N/A;  . NM MYOVIEW LTD  08/01/2011   Abnormal-intermed to high risk based upon per-infarct ischemia  . PERIPHERAL VASCULAR CATHETERIZATION N/A 05/22/2015   Procedure: Lower Extremity Angiography;  Surgeon: Lorretta Harp, MD;  Location: [facility] CV LAB;  Service: Cardiovascular;  Laterality: N/A;  . SHOULDER OPEN ROTATOR CUFF REPAIR Right 1980's  . TONSILLECTOMY    . US ECHOCARDIOGRAPHY  12/24/2011   trace MR,TR,AI,PI. EF >55%    Current Outpatient Prescriptions  Medication Sig Dispense Refill  . acetaminophen (TYLENOL) 500 MG tablet Take 1,000 mg by mouth every 6 (six) hours as needed for moderate pain.    Marland Kitchen amLODipine (NORVASC) 10 MG tablet Take 10 mg by mouth daily.    Marland Kitchen arformoterol (BROVANA) 15 MCG/2ML NEBU Use one dose in nebulizer twice daily to prevent cough or wheeze. 120 mL 5  . aspirin EC 81 MG tablet Take 81 mg by mouth at bedtime.     Marland Kitchen atorvastatin (LIPITOR) 40 MG tablet Take 1 tablet (40 mg total) by mouth at bedtime. 31 tablet 11  . budesonide (PULMICORT) 0.5 MG/2ML nebulizer solution Use one vial in nebulizer twice a day to prevent cough or wheeze. 120 mL 5  . cephALEXin (KEFLEX) 500 MG capsule Take 1,000 mg by mouth daily. For 10 days    . Cholecalciferol (VITAMIN D3) 2000 UNITS TABS Take 1 tablet by mouth.    . clopidogrel (PLAVIX) 75 MG tablet Take 1 tablet (75 mg total) by mouth daily. 30  tablet 11  . Cyanocobalamin (VITAMIN B 12 PO) Take 1 tablet by mouth daily.    Mariane Baumgarten Calcium (STOOL SOFTENER PO) Take by mouth.    . DULoxetine (CYMBALTA) 60 MG capsule Take 60 mg by mouth daily.  0  . fexofenadine (ALLEGRA) 180 MG tablet Take 180 mg by mouth every morning.    . furosemide (LASIX) 40 MG tablet     . ipratropium-albuterol (DUONEB) 0.5-2.5 (3) MG/3ML SOLN Can use one vial in nebulizer every four to six hours as needed for  cough or wheeze. 180 mL 1  . isosorbide mononitrate (IMDUR) 30 MG 24 hr tablet Take 30 mg by mouth 2 (two) times daily.   0  . lactose free nutrition (BOOST) LIQD Take 237 mLs by mouth daily.    Marland Kitchen lidocaine (LIDODERM) 5 % apply 1 patch TO EACH SHOULDER FOR 16 HOURS PER DAY    . Memantine HCl-Donepezil HCl (NAMZARIC) 28-10 MG CP24 Take 1 mg by mouth.    . metolazone (ZAROXOLYN) 2.5 MG tablet Take 2.5 mg by mouth every other day as needed (for fluid). Every other day    . metoprolol succinate (TOPROL-XL) 50 MG 24 hr tablet Take 1 tablet (50mg ) by mouth in the morning and 1/2 tablet (25mg ) by mouth in the evening.    . mirtazapine (REMERON) 15 MG tablet Take 15 mg by mouth at bedtime.    . mometasone (NASONEX) 50 MCG/ACT nasal spray Place 1 spray into the nose 2 (two) times daily.     . Multiple Vitamin (MULTIVITAMIN WITH MINERALS) TABS tablet Take 1 tablet by mouth daily.    . Multiple Vitamins-Minerals (PRESERVISION AREDS PO) Take by mouth daily.    . nitroGLYCERIN (NITROSTAT) 0.4 MG SL tablet Place 0.4 mg under the tongue every 5 (five) minutes as needed for chest pain.    . pantoprazole (PROTONIX) 40 MG tablet take 1 tablet by mouth once daily 30 tablet 6  . potassium chloride (K-DUR) 10 MEQ tablet     . Probiotic Product (PROBIOTIC DAILY PO) Take by mouth daily.    Marland Kitchen RANEXA 500 MG 12 hr tablet take 1 tablet by mouth twice a day 60 tablet 6  . ranitidine (ZANTAC) 300 MG tablet Take 1 tablet (300 mg total) by mouth at bedtime. 30 tablet 5  . tamsulosin  (FLOMAX) 0.4 MG CAPS capsule Take 0.4 mg by mouth daily after supper.    Marland Kitchen amoxicillin (AMOXIL) 875 MG tablet     . HYDROcodone-acetaminophen (NORCO/VICODIN) 5-325 MG tablet     . oxybutynin (DITROPAN) 5 MG tablet      No current facility-administered medications for this visit.     Allergies:   Ciprofloxacin; Codeine; Dexamethasone; Sulfa antibiotics; Sulfonamide derivatives; and Telmisartan    Social History:  The patient  reports that he quit smoking about 43 years ago. His smoking use included Pipe and Cigars. He has a 15.00 pack-year smoking history. He has never used smokeless tobacco. He reports that he drinks alcohol. He reports that he does not use drugs.   Family History:  The patient's family history includes Diabetes in his mother; Heart attack in his father; Heart failure in his mother; Stroke in his maternal grandmother.    ROS:  Please see the history of present illness. All other systems are reviewed and negative.    PHYSICAL EXAM: VS:  BP 139/70   Pulse 75   Ht 5\' 7"  (1.702 m)   Wt 200 lb 3.2 oz (90.8 kg)   SpO2 98%   BMI 31.36 kg/m  , BMI Body mass index is 31.36 kg/m. GEN: Well nourished, well developed, male in no acute distress  HEENT: normal for age  Neck: no JVD, no carotid bruit, no masses Cardiac: RRR; no murmur, no rubs, or gallops Respiratory:  clear to auscultation bilaterally, normal work of breathing GI: soft, nontender, nondistended, + BS MS: no deformity or atrophy; no edema; distal pulses are 2+ in all 4 extremities   Skin: warm and dry, no rash; wound on left lower  extremity is wrapped, no drainage seen on dressing and not disturbed. Neuro:  Strength and sensation are intact; memory is poor Psych: euthymic mood, full affect   EKG:  EKG is ordered today. The ekg ordered today demonstrates sinus rhythm, heart rate 75, no change from 01/2016   Recent Labs: No results found for requested labs within last 8760 hours.    Lipid Panel      Component Value Date/Time   CHOL 154 11/14/2014 0435   TRIG 80 11/14/2014 0435   HDL 43 11/14/2014 0435   CHOLHDL 3.6 11/14/2014 0435   VLDL 16 11/14/2014 0435   LDLCALC 95 11/14/2014 0435     Wt Readings from Last 3 Encounters:  10/14/16 200 lb 3.2 oz (90.8 kg)  06/26/16 205 lb 9.6 oz (93.3 kg)  04/16/16 204 lb 3.2 oz (92.6 kg)     Other studies Reviewed: Additional studies/ records that were reviewed today include: Office notes, hospital records, testing.  ASSESSMENT AND PLAN:  1.  PAF: Patient is not currently having any symptoms from this. Continue Toprol-XL at the current dosing. No anticoagulation secondary to fall risk.  2. CAD: He is not having any ischemic symptoms. He is on aspirin, beta blocker, Imdur, Plavix and statin. OK to increase his activity, contact us for chest pain.  3. Hypertension: His blood pressure is minimally above target, but he is frail and weak. I am concerned that if his blood pressure was lowered he will begin to have problems with dizziness and be at increased risk for falls. No med changes.  4. Chronic diastolic CHF: His EF has not been checked since 2013 when he was greater than 55%. His volume status is good by exam. Continue Lasix 40 mg daily. His labs are followed by his PCP.  5. Weakness: He is weak and has dyspnea on exertion. Because of his arthritis pain, his activity level has been trending down for a long time. I believe he has some problems with deconditioning. He also has some dementia. His wife is taking excellent care of him. I have encouraged him to start walking around the house with his Rollator as he used to. Increase as tolerated and contact us for symptoms.  6. PAD: He is not having any claudication symptoms. The pain in his legs is in his joints. His pulses are intact.  7. Arthritis: This seems to be the major limiting factor to his pain. I explained that NSAIDs would be the treatment of choice but they could not be used on him  because he is on aspirin and Plavix. I suggested that she scheduled Tylenol for him twice a day to see if this helps his symptoms. He is currently taking it once a day when the pain gets very bad. If he needs additional help with his arthritis, he should contact his PCP.   Current medicines are reviewed at length with the patient today.  The patient does not have concerns regarding medicines.  The following changes have been made:  no change  Labs/ tests ordered today include:  No orders of the defined types were placed in this encounter.    Disposition:   FU with Dr. [name]  Signed, Lenoard Aden  10/14/2016 4:35 PM    [place] Phone: [phone]; Fax: [phone]  This note was written with the assistance of speech recognition software. Please excuse any transcriptional errors.

## 2016-10-15 DIAGNOSIS — G629 Polyneuropathy, unspecified: Secondary | ICD-10-CM | POA: Diagnosis not present

## 2016-10-15 DIAGNOSIS — L97821 Non-pressure chronic ulcer of other part of left lower leg limited to breakdown of skin: Secondary | ICD-10-CM | POA: Diagnosis not present

## 2016-10-15 DIAGNOSIS — L97222 Non-pressure chronic ulcer of left calf with fat layer exposed: Secondary | ICD-10-CM | POA: Diagnosis not present

## 2016-10-15 DIAGNOSIS — L97512 Non-pressure chronic ulcer of other part of right foot with fat layer exposed: Secondary | ICD-10-CM | POA: Diagnosis not present

## 2016-10-15 DIAGNOSIS — I872 Venous insufficiency (chronic) (peripheral): Secondary | ICD-10-CM | POA: Diagnosis not present

## 2016-10-15 DIAGNOSIS — L97511 Non-pressure chronic ulcer of other part of right foot limited to breakdown of skin: Secondary | ICD-10-CM | POA: Diagnosis not present

## 2016-10-18 DIAGNOSIS — I5022 Chronic systolic (congestive) heart failure: Secondary | ICD-10-CM | POA: Diagnosis not present

## 2016-10-18 DIAGNOSIS — I130 Hypertensive heart and chronic kidney disease with heart failure and stage 1 through stage 4 chronic kidney disease, or unspecified chronic kidney disease: Secondary | ICD-10-CM | POA: Diagnosis not present

## 2016-10-18 DIAGNOSIS — I87312 Chronic venous hypertension (idiopathic) with ulcer of left lower extremity: Secondary | ICD-10-CM | POA: Diagnosis not present

## 2016-10-18 DIAGNOSIS — L97322 Non-pressure chronic ulcer of left ankle with fat layer exposed: Secondary | ICD-10-CM | POA: Diagnosis not present

## 2016-10-18 DIAGNOSIS — I70202 Unspecified atherosclerosis of native arteries of extremities, left leg: Secondary | ICD-10-CM | POA: Diagnosis not present

## 2016-10-18 DIAGNOSIS — N189 Chronic kidney disease, unspecified: Secondary | ICD-10-CM | POA: Diagnosis not present

## 2016-10-21 DIAGNOSIS — I130 Hypertensive heart and chronic kidney disease with heart failure and stage 1 through stage 4 chronic kidney disease, or unspecified chronic kidney disease: Secondary | ICD-10-CM | POA: Diagnosis not present

## 2016-10-21 DIAGNOSIS — I87312 Chronic venous hypertension (idiopathic) with ulcer of left lower extremity: Secondary | ICD-10-CM | POA: Diagnosis not present

## 2016-10-21 DIAGNOSIS — I70202 Unspecified atherosclerosis of native arteries of extremities, left leg: Secondary | ICD-10-CM | POA: Diagnosis not present

## 2016-10-21 DIAGNOSIS — L97322 Non-pressure chronic ulcer of left ankle with fat layer exposed: Secondary | ICD-10-CM | POA: Diagnosis not present

## 2016-10-21 DIAGNOSIS — N189 Chronic kidney disease, unspecified: Secondary | ICD-10-CM | POA: Diagnosis not present

## 2016-10-21 DIAGNOSIS — I5022 Chronic systolic (congestive) heart failure: Secondary | ICD-10-CM | POA: Diagnosis not present

## 2016-10-23 ENCOUNTER — Other Ambulatory Visit: Payer: Self-pay | Admitting: Cardiovascular Disease

## 2016-10-23 DIAGNOSIS — L97512 Non-pressure chronic ulcer of other part of right foot with fat layer exposed: Secondary | ICD-10-CM | POA: Diagnosis not present

## 2016-10-23 DIAGNOSIS — I872 Venous insufficiency (chronic) (peripheral): Secondary | ICD-10-CM | POA: Diagnosis not present

## 2016-10-23 DIAGNOSIS — R197 Diarrhea, unspecified: Secondary | ICD-10-CM | POA: Diagnosis not present

## 2016-10-23 DIAGNOSIS — I6523 Occlusion and stenosis of bilateral carotid arteries: Secondary | ICD-10-CM

## 2016-10-23 DIAGNOSIS — L97511 Non-pressure chronic ulcer of other part of right foot limited to breakdown of skin: Secondary | ICD-10-CM | POA: Diagnosis not present

## 2016-10-23 DIAGNOSIS — L97821 Non-pressure chronic ulcer of other part of left lower leg limited to breakdown of skin: Secondary | ICD-10-CM | POA: Diagnosis not present

## 2016-10-23 DIAGNOSIS — L97322 Non-pressure chronic ulcer of left ankle with fat layer exposed: Secondary | ICD-10-CM | POA: Diagnosis not present

## 2016-10-23 DIAGNOSIS — H26493 Other secondary cataract, bilateral: Secondary | ICD-10-CM | POA: Diagnosis not present

## 2016-10-23 DIAGNOSIS — G629 Polyneuropathy, unspecified: Secondary | ICD-10-CM | POA: Diagnosis not present

## 2016-10-23 DIAGNOSIS — L97321 Non-pressure chronic ulcer of left ankle limited to breakdown of skin: Secondary | ICD-10-CM | POA: Diagnosis not present

## 2016-10-23 DIAGNOSIS — L97822 Non-pressure chronic ulcer of other part of left lower leg with fat layer exposed: Secondary | ICD-10-CM | POA: Diagnosis not present

## 2016-10-23 DIAGNOSIS — I739 Peripheral vascular disease, unspecified: Secondary | ICD-10-CM

## 2016-10-23 DIAGNOSIS — H353131 Nonexudative age-related macular degeneration, bilateral, early dry stage: Secondary | ICD-10-CM | POA: Diagnosis not present

## 2016-10-25 DIAGNOSIS — I130 Hypertensive heart and chronic kidney disease with heart failure and stage 1 through stage 4 chronic kidney disease, or unspecified chronic kidney disease: Secondary | ICD-10-CM | POA: Diagnosis not present

## 2016-10-25 DIAGNOSIS — L97322 Non-pressure chronic ulcer of left ankle with fat layer exposed: Secondary | ICD-10-CM | POA: Diagnosis not present

## 2016-10-25 DIAGNOSIS — N189 Chronic kidney disease, unspecified: Secondary | ICD-10-CM | POA: Diagnosis not present

## 2016-10-25 DIAGNOSIS — I70202 Unspecified atherosclerosis of native arteries of extremities, left leg: Secondary | ICD-10-CM | POA: Diagnosis not present

## 2016-10-25 DIAGNOSIS — I5022 Chronic systolic (congestive) heart failure: Secondary | ICD-10-CM | POA: Diagnosis not present

## 2016-10-25 DIAGNOSIS — I87312 Chronic venous hypertension (idiopathic) with ulcer of left lower extremity: Secondary | ICD-10-CM | POA: Diagnosis not present

## 2016-10-27 DIAGNOSIS — F419 Anxiety disorder, unspecified: Secondary | ICD-10-CM | POA: Diagnosis not present

## 2016-10-27 DIAGNOSIS — I2510 Atherosclerotic heart disease of native coronary artery without angina pectoris: Secondary | ICD-10-CM | POA: Diagnosis not present

## 2016-10-27 DIAGNOSIS — L03116 Cellulitis of left lower limb: Secondary | ICD-10-CM | POA: Diagnosis not present

## 2016-10-27 DIAGNOSIS — I252 Old myocardial infarction: Secondary | ICD-10-CM | POA: Diagnosis not present

## 2016-10-27 DIAGNOSIS — I130 Hypertensive heart and chronic kidney disease with heart failure and stage 1 through stage 4 chronic kidney disease, or unspecified chronic kidney disease: Secondary | ICD-10-CM | POA: Diagnosis not present

## 2016-10-27 DIAGNOSIS — I70202 Unspecified atherosclerosis of native arteries of extremities, left leg: Secondary | ICD-10-CM | POA: Diagnosis not present

## 2016-10-27 DIAGNOSIS — J189 Pneumonia, unspecified organism: Secondary | ICD-10-CM | POA: Diagnosis not present

## 2016-10-27 DIAGNOSIS — I87312 Chronic venous hypertension (idiopathic) with ulcer of left lower extremity: Secondary | ICD-10-CM | POA: Diagnosis not present

## 2016-10-27 DIAGNOSIS — I480 Paroxysmal atrial fibrillation: Secondary | ICD-10-CM | POA: Diagnosis not present

## 2016-10-27 DIAGNOSIS — F0390 Unspecified dementia without behavioral disturbance: Secondary | ICD-10-CM | POA: Diagnosis not present

## 2016-10-27 DIAGNOSIS — N189 Chronic kidney disease, unspecified: Secondary | ICD-10-CM | POA: Diagnosis not present

## 2016-10-27 DIAGNOSIS — L97322 Non-pressure chronic ulcer of left ankle with fat layer exposed: Secondary | ICD-10-CM | POA: Diagnosis not present

## 2016-10-27 DIAGNOSIS — I87321 Chronic venous hypertension (idiopathic) with inflammation of right lower extremity: Secondary | ICD-10-CM | POA: Diagnosis not present

## 2016-10-27 DIAGNOSIS — Z792 Long term (current) use of antibiotics: Secondary | ICD-10-CM | POA: Diagnosis not present

## 2016-10-27 DIAGNOSIS — Z7982 Long term (current) use of aspirin: Secondary | ICD-10-CM | POA: Diagnosis not present

## 2016-10-27 DIAGNOSIS — I5022 Chronic systolic (congestive) heart failure: Secondary | ICD-10-CM | POA: Diagnosis not present

## 2016-10-28 DIAGNOSIS — I130 Hypertensive heart and chronic kidney disease with heart failure and stage 1 through stage 4 chronic kidney disease, or unspecified chronic kidney disease: Secondary | ICD-10-CM | POA: Diagnosis not present

## 2016-10-28 DIAGNOSIS — I87312 Chronic venous hypertension (idiopathic) with ulcer of left lower extremity: Secondary | ICD-10-CM | POA: Diagnosis not present

## 2016-10-28 DIAGNOSIS — I5022 Chronic systolic (congestive) heart failure: Secondary | ICD-10-CM | POA: Diagnosis not present

## 2016-10-28 DIAGNOSIS — L03116 Cellulitis of left lower limb: Secondary | ICD-10-CM | POA: Diagnosis not present

## 2016-10-28 DIAGNOSIS — L97322 Non-pressure chronic ulcer of left ankle with fat layer exposed: Secondary | ICD-10-CM | POA: Diagnosis not present

## 2016-10-28 DIAGNOSIS — I70202 Unspecified atherosclerosis of native arteries of extremities, left leg: Secondary | ICD-10-CM | POA: Diagnosis not present

## 2016-10-28 DIAGNOSIS — I87321 Chronic venous hypertension (idiopathic) with inflammation of right lower extremity: Secondary | ICD-10-CM | POA: Diagnosis not present

## 2016-10-28 DIAGNOSIS — J189 Pneumonia, unspecified organism: Secondary | ICD-10-CM | POA: Diagnosis not present

## 2016-10-31 DIAGNOSIS — L97821 Non-pressure chronic ulcer of other part of left lower leg limited to breakdown of skin: Secondary | ICD-10-CM | POA: Diagnosis not present

## 2016-10-31 DIAGNOSIS — L97322 Non-pressure chronic ulcer of left ankle with fat layer exposed: Secondary | ICD-10-CM | POA: Diagnosis not present

## 2016-10-31 DIAGNOSIS — G629 Polyneuropathy, unspecified: Secondary | ICD-10-CM | POA: Diagnosis not present

## 2016-10-31 DIAGNOSIS — L97321 Non-pressure chronic ulcer of left ankle limited to breakdown of skin: Secondary | ICD-10-CM | POA: Diagnosis not present

## 2016-10-31 DIAGNOSIS — L97512 Non-pressure chronic ulcer of other part of right foot with fat layer exposed: Secondary | ICD-10-CM | POA: Diagnosis not present

## 2016-10-31 DIAGNOSIS — L97222 Non-pressure chronic ulcer of left calf with fat layer exposed: Secondary | ICD-10-CM | POA: Diagnosis not present

## 2016-10-31 DIAGNOSIS — I872 Venous insufficiency (chronic) (peripheral): Secondary | ICD-10-CM | POA: Diagnosis not present

## 2016-10-31 DIAGNOSIS — L97511 Non-pressure chronic ulcer of other part of right foot limited to breakdown of skin: Secondary | ICD-10-CM | POA: Diagnosis not present

## 2016-11-01 DIAGNOSIS — I87321 Chronic venous hypertension (idiopathic) with inflammation of right lower extremity: Secondary | ICD-10-CM | POA: Diagnosis not present

## 2016-11-01 DIAGNOSIS — L97322 Non-pressure chronic ulcer of left ankle with fat layer exposed: Secondary | ICD-10-CM | POA: Diagnosis not present

## 2016-11-01 DIAGNOSIS — I87312 Chronic venous hypertension (idiopathic) with ulcer of left lower extremity: Secondary | ICD-10-CM | POA: Diagnosis not present

## 2016-11-01 DIAGNOSIS — I70202 Unspecified atherosclerosis of native arteries of extremities, left leg: Secondary | ICD-10-CM | POA: Diagnosis not present

## 2016-11-01 DIAGNOSIS — I5022 Chronic systolic (congestive) heart failure: Secondary | ICD-10-CM | POA: Diagnosis not present

## 2016-11-01 DIAGNOSIS — I130 Hypertensive heart and chronic kidney disease with heart failure and stage 1 through stage 4 chronic kidney disease, or unspecified chronic kidney disease: Secondary | ICD-10-CM | POA: Diagnosis not present

## 2016-11-04 DIAGNOSIS — I130 Hypertensive heart and chronic kidney disease with heart failure and stage 1 through stage 4 chronic kidney disease, or unspecified chronic kidney disease: Secondary | ICD-10-CM | POA: Diagnosis not present

## 2016-11-04 DIAGNOSIS — I5022 Chronic systolic (congestive) heart failure: Secondary | ICD-10-CM | POA: Diagnosis not present

## 2016-11-04 DIAGNOSIS — L97322 Non-pressure chronic ulcer of left ankle with fat layer exposed: Secondary | ICD-10-CM | POA: Diagnosis not present

## 2016-11-04 DIAGNOSIS — I87312 Chronic venous hypertension (idiopathic) with ulcer of left lower extremity: Secondary | ICD-10-CM | POA: Diagnosis not present

## 2016-11-04 DIAGNOSIS — I70202 Unspecified atherosclerosis of native arteries of extremities, left leg: Secondary | ICD-10-CM | POA: Diagnosis not present

## 2016-11-04 DIAGNOSIS — I87321 Chronic venous hypertension (idiopathic) with inflammation of right lower extremity: Secondary | ICD-10-CM | POA: Diagnosis not present

## 2016-11-06 DIAGNOSIS — L97312 Non-pressure chronic ulcer of right ankle with fat layer exposed: Secondary | ICD-10-CM | POA: Diagnosis not present

## 2016-11-06 DIAGNOSIS — G629 Polyneuropathy, unspecified: Secondary | ICD-10-CM | POA: Diagnosis not present

## 2016-11-06 DIAGNOSIS — I872 Venous insufficiency (chronic) (peripheral): Secondary | ICD-10-CM | POA: Diagnosis not present

## 2016-11-06 DIAGNOSIS — L97222 Non-pressure chronic ulcer of left calf with fat layer exposed: Secondary | ICD-10-CM | POA: Diagnosis not present

## 2016-11-06 DIAGNOSIS — L97512 Non-pressure chronic ulcer of other part of right foot with fat layer exposed: Secondary | ICD-10-CM | POA: Diagnosis not present

## 2016-11-08 DIAGNOSIS — I70202 Unspecified atherosclerosis of native arteries of extremities, left leg: Secondary | ICD-10-CM | POA: Diagnosis not present

## 2016-11-08 DIAGNOSIS — I87321 Chronic venous hypertension (idiopathic) with inflammation of right lower extremity: Secondary | ICD-10-CM | POA: Diagnosis not present

## 2016-11-08 DIAGNOSIS — I130 Hypertensive heart and chronic kidney disease with heart failure and stage 1 through stage 4 chronic kidney disease, or unspecified chronic kidney disease: Secondary | ICD-10-CM | POA: Diagnosis not present

## 2016-11-08 DIAGNOSIS — L97322 Non-pressure chronic ulcer of left ankle with fat layer exposed: Secondary | ICD-10-CM | POA: Diagnosis not present

## 2016-11-08 DIAGNOSIS — I5022 Chronic systolic (congestive) heart failure: Secondary | ICD-10-CM | POA: Diagnosis not present

## 2016-11-08 DIAGNOSIS — R4182 Altered mental status, unspecified: Secondary | ICD-10-CM | POA: Diagnosis not present

## 2016-11-08 DIAGNOSIS — I87312 Chronic venous hypertension (idiopathic) with ulcer of left lower extremity: Secondary | ICD-10-CM | POA: Diagnosis not present

## 2016-11-08 DIAGNOSIS — R531 Weakness: Secondary | ICD-10-CM | POA: Diagnosis not present

## 2016-11-11 ENCOUNTER — Inpatient Hospital Stay (HOSPITAL_COMMUNITY): Admission: RE | Admit: 2016-11-11 | Payer: Medicare Other | Source: Ambulatory Visit

## 2016-11-11 DIAGNOSIS — I87321 Chronic venous hypertension (idiopathic) with inflammation of right lower extremity: Secondary | ICD-10-CM | POA: Diagnosis not present

## 2016-11-11 DIAGNOSIS — I130 Hypertensive heart and chronic kidney disease with heart failure and stage 1 through stage 4 chronic kidney disease, or unspecified chronic kidney disease: Secondary | ICD-10-CM | POA: Diagnosis not present

## 2016-11-11 DIAGNOSIS — L97322 Non-pressure chronic ulcer of left ankle with fat layer exposed: Secondary | ICD-10-CM | POA: Diagnosis not present

## 2016-11-11 DIAGNOSIS — I70202 Unspecified atherosclerosis of native arteries of extremities, left leg: Secondary | ICD-10-CM | POA: Diagnosis not present

## 2016-11-11 DIAGNOSIS — I5022 Chronic systolic (congestive) heart failure: Secondary | ICD-10-CM | POA: Diagnosis not present

## 2016-11-11 DIAGNOSIS — I87312 Chronic venous hypertension (idiopathic) with ulcer of left lower extremity: Secondary | ICD-10-CM | POA: Diagnosis not present

## 2016-11-13 DIAGNOSIS — L97222 Non-pressure chronic ulcer of left calf with fat layer exposed: Secondary | ICD-10-CM | POA: Diagnosis not present

## 2016-11-13 DIAGNOSIS — L97821 Non-pressure chronic ulcer of other part of left lower leg limited to breakdown of skin: Secondary | ICD-10-CM | POA: Diagnosis not present

## 2016-11-13 DIAGNOSIS — L97511 Non-pressure chronic ulcer of other part of right foot limited to breakdown of skin: Secondary | ICD-10-CM | POA: Diagnosis not present

## 2016-11-13 DIAGNOSIS — I872 Venous insufficiency (chronic) (peripheral): Secondary | ICD-10-CM | POA: Diagnosis not present

## 2016-11-13 DIAGNOSIS — L97312 Non-pressure chronic ulcer of right ankle with fat layer exposed: Secondary | ICD-10-CM | POA: Diagnosis not present

## 2016-11-13 DIAGNOSIS — G629 Polyneuropathy, unspecified: Secondary | ICD-10-CM | POA: Diagnosis not present

## 2016-11-13 DIAGNOSIS — L97512 Non-pressure chronic ulcer of other part of right foot with fat layer exposed: Secondary | ICD-10-CM | POA: Diagnosis not present

## 2016-11-15 DIAGNOSIS — I5022 Chronic systolic (congestive) heart failure: Secondary | ICD-10-CM | POA: Diagnosis not present

## 2016-11-15 DIAGNOSIS — I87312 Chronic venous hypertension (idiopathic) with ulcer of left lower extremity: Secondary | ICD-10-CM | POA: Diagnosis not present

## 2016-11-15 DIAGNOSIS — I130 Hypertensive heart and chronic kidney disease with heart failure and stage 1 through stage 4 chronic kidney disease, or unspecified chronic kidney disease: Secondary | ICD-10-CM | POA: Diagnosis not present

## 2016-11-15 DIAGNOSIS — L97322 Non-pressure chronic ulcer of left ankle with fat layer exposed: Secondary | ICD-10-CM | POA: Diagnosis not present

## 2016-11-15 DIAGNOSIS — I70202 Unspecified atherosclerosis of native arteries of extremities, left leg: Secondary | ICD-10-CM | POA: Diagnosis not present

## 2016-11-15 DIAGNOSIS — I87321 Chronic venous hypertension (idiopathic) with inflammation of right lower extremity: Secondary | ICD-10-CM | POA: Diagnosis not present

## 2016-11-18 DIAGNOSIS — I87321 Chronic venous hypertension (idiopathic) with inflammation of right lower extremity: Secondary | ICD-10-CM | POA: Diagnosis not present

## 2016-11-18 DIAGNOSIS — I130 Hypertensive heart and chronic kidney disease with heart failure and stage 1 through stage 4 chronic kidney disease, or unspecified chronic kidney disease: Secondary | ICD-10-CM | POA: Diagnosis not present

## 2016-11-18 DIAGNOSIS — I70202 Unspecified atherosclerosis of native arteries of extremities, left leg: Secondary | ICD-10-CM | POA: Diagnosis not present

## 2016-11-18 DIAGNOSIS — I87312 Chronic venous hypertension (idiopathic) with ulcer of left lower extremity: Secondary | ICD-10-CM | POA: Diagnosis not present

## 2016-11-18 DIAGNOSIS — I5022 Chronic systolic (congestive) heart failure: Secondary | ICD-10-CM | POA: Diagnosis not present

## 2016-11-18 DIAGNOSIS — L97322 Non-pressure chronic ulcer of left ankle with fat layer exposed: Secondary | ICD-10-CM | POA: Diagnosis not present

## 2016-11-20 DIAGNOSIS — L97222 Non-pressure chronic ulcer of left calf with fat layer exposed: Secondary | ICD-10-CM | POA: Diagnosis not present

## 2016-11-20 DIAGNOSIS — L97311 Non-pressure chronic ulcer of right ankle limited to breakdown of skin: Secondary | ICD-10-CM | POA: Diagnosis not present

## 2016-11-20 DIAGNOSIS — L97511 Non-pressure chronic ulcer of other part of right foot limited to breakdown of skin: Secondary | ICD-10-CM | POA: Diagnosis not present

## 2016-11-20 DIAGNOSIS — G629 Polyneuropathy, unspecified: Secondary | ICD-10-CM | POA: Diagnosis not present

## 2016-11-20 DIAGNOSIS — L97512 Non-pressure chronic ulcer of other part of right foot with fat layer exposed: Secondary | ICD-10-CM | POA: Diagnosis not present

## 2016-11-20 DIAGNOSIS — L03116 Cellulitis of left lower limb: Secondary | ICD-10-CM | POA: Diagnosis not present

## 2016-11-20 DIAGNOSIS — L97821 Non-pressure chronic ulcer of other part of left lower leg limited to breakdown of skin: Secondary | ICD-10-CM | POA: Diagnosis not present

## 2016-11-20 DIAGNOSIS — L97312 Non-pressure chronic ulcer of right ankle with fat layer exposed: Secondary | ICD-10-CM | POA: Diagnosis not present

## 2016-11-20 DIAGNOSIS — I872 Venous insufficiency (chronic) (peripheral): Secondary | ICD-10-CM | POA: Diagnosis not present

## 2016-11-22 DIAGNOSIS — F0390 Unspecified dementia without behavioral disturbance: Secondary | ICD-10-CM | POA: Diagnosis not present

## 2016-11-22 DIAGNOSIS — L97322 Non-pressure chronic ulcer of left ankle with fat layer exposed: Secondary | ICD-10-CM | POA: Diagnosis not present

## 2016-11-22 DIAGNOSIS — I87312 Chronic venous hypertension (idiopathic) with ulcer of left lower extremity: Secondary | ICD-10-CM | POA: Diagnosis not present

## 2016-11-22 DIAGNOSIS — I5022 Chronic systolic (congestive) heart failure: Secondary | ICD-10-CM | POA: Diagnosis not present

## 2016-11-22 DIAGNOSIS — S79911A Unspecified injury of right hip, initial encounter: Secondary | ICD-10-CM | POA: Diagnosis not present

## 2016-11-22 DIAGNOSIS — T148XXA Other injury of unspecified body region, initial encounter: Secondary | ICD-10-CM | POA: Diagnosis not present

## 2016-11-22 DIAGNOSIS — I87321 Chronic venous hypertension (idiopathic) with inflammation of right lower extremity: Secondary | ICD-10-CM | POA: Diagnosis not present

## 2016-11-22 DIAGNOSIS — S299XXA Unspecified injury of thorax, initial encounter: Secondary | ICD-10-CM | POA: Diagnosis not present

## 2016-11-22 DIAGNOSIS — M25551 Pain in right hip: Secondary | ICD-10-CM | POA: Diagnosis not present

## 2016-11-22 DIAGNOSIS — I130 Hypertensive heart and chronic kidney disease with heart failure and stage 1 through stage 4 chronic kidney disease, or unspecified chronic kidney disease: Secondary | ICD-10-CM | POA: Diagnosis not present

## 2016-11-22 DIAGNOSIS — S0990XA Unspecified injury of head, initial encounter: Secondary | ICD-10-CM | POA: Diagnosis not present

## 2016-11-22 DIAGNOSIS — I70202 Unspecified atherosclerosis of native arteries of extremities, left leg: Secondary | ICD-10-CM | POA: Diagnosis not present

## 2016-11-25 ENCOUNTER — Telehealth: Payer: Self-pay | Admitting: Cardiovascular Disease

## 2016-11-25 DIAGNOSIS — I87321 Chronic venous hypertension (idiopathic) with inflammation of right lower extremity: Secondary | ICD-10-CM | POA: Diagnosis not present

## 2016-11-25 DIAGNOSIS — I70202 Unspecified atherosclerosis of native arteries of extremities, left leg: Secondary | ICD-10-CM | POA: Diagnosis not present

## 2016-11-25 DIAGNOSIS — I5022 Chronic systolic (congestive) heart failure: Secondary | ICD-10-CM | POA: Diagnosis not present

## 2016-11-25 DIAGNOSIS — L97322 Non-pressure chronic ulcer of left ankle with fat layer exposed: Secondary | ICD-10-CM | POA: Diagnosis not present

## 2016-11-25 DIAGNOSIS — I87312 Chronic venous hypertension (idiopathic) with ulcer of left lower extremity: Secondary | ICD-10-CM | POA: Diagnosis not present

## 2016-11-25 DIAGNOSIS — I130 Hypertensive heart and chronic kidney disease with heart failure and stage 1 through stage 4 chronic kidney disease, or unspecified chronic kidney disease: Secondary | ICD-10-CM | POA: Diagnosis not present

## 2016-11-25 NOTE — Telephone Encounter (Signed)
Spoke with wife she states that pt has been falling a lot and having high Bp. She states that pt went to the ER at [facility] yesterday and his BP was over 200. Wife states that pt fell again(told her to go back to the ER for eval she states that he will not go) pt BP now is 203/105 HR 84. Wife states that Home health nurse was there and took BP it was 215/110. Scheduled appt with Ignacia Bayley, NP tomorrow.

## 2016-11-25 NOTE — Telephone Encounter (Signed)
New Message   Pt c/o BP issue: STAT if pt c/o blurred vision, one-sided weakness or slurred speech  1. What are your last 5 BP readings? 215/110  2. Are you having any other symptoms (ex. Dizziness, headache, blurred vision, passed out)? Dizzines  3. What is your BP issue? Per pt wife Home health nurse came out, and took blood pressure, and advised them to reach out to cardiologist.

## 2016-11-26 ENCOUNTER — Encounter: Payer: Self-pay | Admitting: Nurse Practitioner

## 2016-11-26 ENCOUNTER — Ambulatory Visit (INDEPENDENT_AMBULATORY_CARE_PROVIDER_SITE_OTHER): Payer: Medicare Other | Admitting: Nurse Practitioner

## 2016-11-26 ENCOUNTER — Telehealth: Payer: Self-pay | Admitting: Nurse Practitioner

## 2016-11-26 VITALS — BP 152/80 | HR 84 | Ht 68.0 in | Wt 186.0 lb

## 2016-11-26 DIAGNOSIS — F0391 Unspecified dementia with behavioral disturbance: Secondary | ICD-10-CM

## 2016-11-26 DIAGNOSIS — I2510 Atherosclerotic heart disease of native coronary artery without angina pectoris: Secondary | ICD-10-CM

## 2016-11-26 DIAGNOSIS — I6523 Occlusion and stenosis of bilateral carotid arteries: Secondary | ICD-10-CM

## 2016-11-26 DIAGNOSIS — I739 Peripheral vascular disease, unspecified: Secondary | ICD-10-CM | POA: Diagnosis not present

## 2016-11-26 DIAGNOSIS — I119 Hypertensive heart disease without heart failure: Secondary | ICD-10-CM

## 2016-11-26 NOTE — Progress Notes (Signed)
Office Visit    Patient Name: Gavin [name] Date of Encounter: 11/26/2016  Primary Care Provider:  Ann Held, MD Primary Cardiologist:  Adora Fridge, MD   Chief Complaint    83 year old male with prior history of CAD, peripheral vascular disease, paroxysmal atrial fibrillation, hypertension, hyperlipidemia, and progressive dementia, who presents for follow-up related to hypertension the setting of not taking his pills.  Past Medical History    Past Medical History:  Diagnosis Date  . Allergy   . Anemia   . Arthritis    "all over"  . CAD (coronary artery disease)    a. 2007 STEMI: stent to LAD;  b. multiple PCI's. c. 10/2014 NSTEMI/PCI: LM nl, LAD 145m, D1 50ost, LCX nl/small, RCA 71m (3.0x20 Promus DES), EF 45-50% (this admission is under MR # [id number])  . Carotid disease, bilateral ([place])    a. 08/2015 Carotid U/S: Bilat 60-79% stenosis, R ECA >50%.  . Chronic back pain    "all over"  . Daily headache    negative work up, no change off Imdur  . Depression   . DVT (deep venous thrombosis) ([place]) ~ 1980   "in one of his legs; was in ICU for some time"  . GERD (gastroesophageal reflux disease)   . History of echocardiogram    a. 12/2011 Echo: > 55%.  Marland Kitchen Hyperlipidemia   . Hypertensive heart disease   . Lung nodules    CT 07-01-10 stable @ [facility]  . OSA (obstructive sleep apnea)    NPSG 05-05-99 RDI 65/hr-CPAP  . PAF (paroxysmal atrial fibrillation) ([facility])    a. CHA2DS2VASc = 4--no OAC 2/2 h/o falls.  . Pneumonia ~ 2013  . PVD (peripheral vascular disease) ([place])    a. 2004 s/p stenting of bilat SFA's;  b. 04/2015 PTA of occluded distal L SFA and P1 segment of L popliteal;  c. 03/2016 ABI: R 0.94, L 0.96.  Marland Kitchen Reflux esophagitis   . Venous ulcer of leg ([place])    a. followed in wound clinic.   Past Surgical History:  Procedure Laterality Date  . APPENDECTOMY    . CARDIAC CATHETERIZATION  10/03/2008   Occluded LAD  . CARDIAC CATHETERIZATION  06/01/13   medical Rx  . CAROTID  DOPPLER  05/29/2012   bilateral ICA stenosis  . CATARACT EXTRACTION W/ INTRAOCULAR LENS  IMPLANT, BILATERAL Bilateral 2015  . CORONARY ANGIOPLASTY WITH STENT PLACEMENT  06/13/2004   successful cutting balloon atherectomy/PIi & STENTING mid LAD & POBA of the distal LAD  . CORONARY ANGIOPLASTY WITH STENT PLACEMENT  06/03/2006   successful PTCA & stent mid LAD  . CORONARY ANGIOPLASTY WITH STENT PLACEMENT  06/06/2006   successful double stenting of the ostial and prox RCA, successful PTCA LAD of the ostium major diag branch due to jailing by a previously placed LAD stent,successful mid kiagonal branch primary stenting  . CORONARY ANGIOPLASTY WITH STENT PLACEMENT  09/20/2008   PTCA LAD stent  . LEFT HEART CATHETERIZATION WITH CORONARY ANGIOGRAM N/A 11/14/2014   Procedure: LEFT HEART CATHETERIZATION WITH CORONARY ANGIOGRAM;  Surgeon: Blane Ohara, MD;  Location[facility] CATH LAB;  Service: Cardiovascular;  Laterality: N/A;  . LEFT HEART CATHETERIZATION WITH CORONARY ANGIOGRAM N/A 06/01/2013   Procedure: LEFT HEART CATHETERIZATION WITH CORONARY ANGIOGRAM;  Surgeon: Leonie Man, MD;  Location: [facility] CATH LAB;  Service: Cardiovascular;  Laterality: N/A;  . NM MYOVIEW LTD  08/01/2011   Abnormal-intermed to high risk based upon per-infarct ischemia  . PERIPHERAL VASCULAR CATHETERIZATION  N/A 05/22/2015   Procedure: Lower Extremity Angiography;  Surgeon: Lorretta Harp, MD;  Location: [facility] CV LAB;  Service: Cardiovascular;  Laterality: N/A;  . SHOULDER OPEN ROTATOR CUFF REPAIR Right 1980's  . TONSILLECTOMY    . US ECHOCARDIOGRAPHY  12/24/2011   trace MR,TR,AI,PI. EF >55%    Allergies  Allergies  Allergen Reactions  . Ciprofloxacin Other (See Comments)    Unknown allergic reaction, possible shortness of breath  . Codeine Other (See Comments)    "knocked me out"  . Dexamethasone Other (See Comments)    Unknown allergic reaction  . Sulfa Antibiotics Nausea And Vomiting  . Sulfonamide Derivatives      Unknown reaction  . Telmisartan Other (See Comments)    Micardis - heart races    History of Present Illness    83 y/o ? with the above complex pmh including CAD s/p multiple PCI's, peripheral arterial disease status post bilateral SFA stenting with repeat percutaneous transluminal angioplasty to the left SFA in August 2016, paroxysmal atrial fibrillation, hypertension, hyperlipidemia, and progressive dementia. He was last seen in clinic in January, at which time there were multiple complaints though the biggest of which seem to be related to weakness and arthritis. No changes were made to his regimen. Since then, his wife notes that he has had further progression of his dementia. He has been refusing his medications and also has been eating very little. She has been able to get applesauce, ice cream, and potentially a boost in to him though there are some days where he does not eat at all. She does try to put his medicines in with his food when possible. He has required significantly more supervision over the past month and is now 24 hour care. Because of progression of dementia, his family has reached out to primary care and was recently advised to take him to the emergency department at [facility]. His wife says he was evaluated there, admitted, and subsequently discharged with home health. There were apparently no acute findings. At this point, his wife is having trouble taking care of him and is going to be looking to a private home care agency and also look into skilled nursing facility/dementia unit. He has not had any specific complaints other than pain related to trauma from falls. Interestingly, he will allow his wife to give her his pain medication. He is not able to provide any history today as his speech is very difficult to understand and garbled. His wife says he's been like this for the past month.  He continues to see wound care and his lower ext ulcers cont to heal slowly.  Home  Medications    Prior to Admission medications   Medication Sig Start Date End Date Taking? Authorizing Provider  acetaminophen (TYLENOL) 500 MG tablet Take 1,000 mg by mouth 2 (two) times daily with a meal.   Yes Historical Provider, MD  amLODipine (NORVASC) 10 MG tablet Take 10 mg by mouth daily.   Yes Historical Provider, MD  aspirin EC 81 MG tablet Take 81 mg by mouth at bedtime.    Yes Historical Provider, MD  atorvastatin (LIPITOR) 40 MG tablet Take 1 tablet (40 mg total) by mouth at bedtime. 12/08/14  Yes Lorretta Harp, MD  Cholecalciferol (VITAMIN D3) 2000 UNITS TABS Take 1 tablet by mouth.   Yes Historical Provider, MD  clopidogrel (PLAVIX) 75 MG tablet Take 1 tablet (75 mg total) by mouth daily. 02/07/15  Yes Lorretta Harp,  MD  Cyanocobalamin (VITAMIN B 12 PO) Take 1 tablet by mouth daily.   Yes Historical Provider, MD  Docusate Calcium (STOOL SOFTENER PO) Take by mouth.   Yes Historical Provider, MD  DULoxetine (CYMBALTA) 60 MG capsule Take 60 mg by mouth daily. 11/09/14  Yes Historical Provider, MD  fexofenadine (ALLEGRA) 180 MG tablet Take 180 mg by mouth every morning.   Yes Historical Provider, MD  furosemide (LASIX) 40 MG tablet  06/22/16  Yes Historical Provider, MD  ipratropium-albuterol (DUONEB) 0.5-2.5 (3) MG/3ML SOLN Can use one vial in nebulizer every four to six hours as needed for cough or wheeze. 06/26/16  Yes Jiles Prows, MD  isosorbide mononitrate (IMDUR) 30 MG 24 hr tablet Take 30 mg by mouth 2 (two) times daily.  11/09/14  Yes Historical Provider, MD  lactose free nutrition (BOOST) LIQD Take 237 mLs by mouth daily.   Yes Historical Provider, MD  Memantine HCl-Donepezil HCl (NAMZARIC) 28-10 MG CP24 Take 1 mg by mouth.   Yes Historical Provider, MD  metolazone (ZAROXOLYN) 2.5 MG tablet Take 2.5 mg by mouth every other day as needed (for fluid). Every other day   Yes Historical Provider, MD  metoprolol succinate (TOPROL-XL) 50 MG 24 hr tablet Take 1 tablet (50mg ) by  mouth in the morning and 1/2 tablet (25mg ) by mouth in the evening.   Yes Historical Provider, MD  mirtazapine (REMERON) 15 MG tablet Take 15 mg by mouth at bedtime.   Yes Historical Provider, MD  mometasone (NASONEX) 50 MCG/ACT nasal spray Place 1 spray into the nose 2 (two) times daily.  05/17/16  Yes Historical Provider, MD  Multiple Vitamin (MULTIVITAMIN WITH MINERALS) TABS tablet Take 1 tablet by mouth daily.   Yes Historical Provider, MD  Multiple Vitamins-Minerals (PRESERVISION AREDS PO) Take by mouth daily.   Yes Historical Provider, MD  nitroGLYCERIN (NITROSTAT) 0.4 MG SL tablet Place 0.4 mg under the tongue every 5 (five) minutes as needed for chest pain.   Yes Historical Provider, MD  pantoprazole (PROTONIX) 40 MG tablet take 1 tablet by mouth once daily 06/19/15  Yes Lorretta Harp, MD  potassium chloride (K-DUR) 10 MEQ tablet  06/22/16  Yes Historical Provider, MD  Probiotic Product (PROBIOTIC DAILY PO) Take by mouth daily.   Yes Historical Provider, MD  RANEXA 500 MG 12 hr tablet take 1 tablet by mouth twice a day 07/08/16  Yes Lorretta Harp, MD  ranitidine (ZANTAC) 300 MG tablet Take 1 tablet (300 mg total) by mouth at bedtime. 06/26/16  Yes Jiles Prows, MD    Review of Systems    As above, he has had progression of dementia per wife. He is eating and drinking very little and has been difficult to manage. He has not recently complained of angina or dyspnea. He is weak and does require a walker. There is no history of PND, orthopnea, syncope, edema, or early satiety.  All other systems reviewed and are otherwise negative except as noted above.  Physical Exam    VS:  BP (!) 152/80   Pulse 84   Ht 5\' 8"  (1.727 m)   Wt 186 lb (84.4 kg)   BMI 28.28 kg/m  , BMI Body mass index is 28.28 kg/m. GEN: Pt dozes frequently.  Speech is garbled and very difficult to understand. HEENT: normal.  Neck: Supple, no JVD, carotid bruits, or masses. Cardiac: RRR, no murmurs, rubs, or gallops.  No clubbing, cyanosis, edema.  Radials/DP/PT 1+ and equal bilaterally.  Respiratory:  Respirations regular and unlabored, clear to auscultation bilaterally. GI: Soft, nontender, nondistended, BS + x 4. MS: no deformity or atrophy. Skin: left ankle wrapped w/ gauze dsg. Neuro:  Strength and sensation are intact. Psych: Normal affect.  Assessment & Plan    1.  Progressive dementia: Patient's wife brought patient today due to elevated blood pressures at home in the setting of not taking his medications. He has not been eating or drinking well. She tries to put his medicines in his applesauce or ice cream but he often spits them out. His dementia has progressed significantly over the past month and he was seen at [facility] but subsequently discharged with a diagnosis of weakness and home health has been coming out of the house but the family is unable to provide 24-hour supervision and this is progressively gotten more difficult for them. We will make an ambulatory referral to social work to provide assistance to patient's family as they are now considering skilled nursing facility placement and no really know where to begin. Patient's wife is fairly clear that they are not interested in G-tube placement and that she does have advanced directives in place for him.  2. Coronary artery disease: Patient has not been complaining of any angina or dyspnea recently. He is on aspirin, statin, Plavix, beta blocker, and nitrate therapy chronically, though he has taken this sparsely over the past month or more.  3. Peripheral arterial disease/carotid arterial disease: Lower extremity wounds continue to heal slowly. He continues to be seen in wound clinic. He was due to have repeat carotid ultrasound and lower extremity arterial Doppler in February however these were not performed because of his progressive dementia. I talked with his wife today and she is not interested in pursuing any further imaging in the  setting of progressive dementia.  3. Hypertensive heart disease: Blood pressure is actually pretty decent today at 152/80. He has been taking his medicine sparingly, only when his wife can mix it in with his ice cream or applesauce. This is a difficult situation. See #1.  4. Disposition: I spent 45 minutes with Mr. And Mrs. Wease today.  I will make a referral to social work. Follow-up with Dr. [name] as needed.   Murray Hodgkins, NP 11/26/2016, 9:50 AM

## 2016-11-26 NOTE — Patient Instructions (Addendum)
Medication Instructions:  Your physician recommends that you continue on your current medications as directed. Please refer to the Current Medication list given to you today.  Lab work: NONE  Testing/Procedures: NONE  Follow-Up: Your physician wants you to follow-up as needed with  Dr. [name].  You have been referred to [facility] for [facility].   If you need a refill on your cardiac medications before your next appointment, please call your pharmacy.

## 2016-11-26 NOTE — Telephone Encounter (Signed)
Spoke with Dr. Bary Leriche triage nurse, Adonis Brook (?) about Gavin [name] need for social worker evaluation for SNF placement.  She will talk to Dr. Nicki Reaper, and they will have home health/social worker evaluate him at home.  I called Gavin [name] and advised her of the plan. She will follow up with Dr. Bary Leriche office and was appreciative of the help.

## 2016-11-27 DIAGNOSIS — L97512 Non-pressure chronic ulcer of other part of right foot with fat layer exposed: Secondary | ICD-10-CM | POA: Diagnosis not present

## 2016-11-27 DIAGNOSIS — I872 Venous insufficiency (chronic) (peripheral): Secondary | ICD-10-CM | POA: Diagnosis not present

## 2016-11-27 DIAGNOSIS — L97511 Non-pressure chronic ulcer of other part of right foot limited to breakdown of skin: Secondary | ICD-10-CM | POA: Diagnosis not present

## 2016-11-27 DIAGNOSIS — L97821 Non-pressure chronic ulcer of other part of left lower leg limited to breakdown of skin: Secondary | ICD-10-CM | POA: Diagnosis not present

## 2016-11-27 DIAGNOSIS — L97222 Non-pressure chronic ulcer of left calf with fat layer exposed: Secondary | ICD-10-CM | POA: Diagnosis not present

## 2016-11-27 DIAGNOSIS — L97311 Non-pressure chronic ulcer of right ankle limited to breakdown of skin: Secondary | ICD-10-CM | POA: Diagnosis not present

## 2016-11-27 DIAGNOSIS — L97312 Non-pressure chronic ulcer of right ankle with fat layer exposed: Secondary | ICD-10-CM | POA: Diagnosis not present

## 2016-11-27 DIAGNOSIS — G629 Polyneuropathy, unspecified: Secondary | ICD-10-CM | POA: Diagnosis not present

## 2016-11-29 DIAGNOSIS — I87312 Chronic venous hypertension (idiopathic) with ulcer of left lower extremity: Secondary | ICD-10-CM | POA: Diagnosis not present

## 2016-11-29 DIAGNOSIS — I70202 Unspecified atherosclerosis of native arteries of extremities, left leg: Secondary | ICD-10-CM | POA: Diagnosis not present

## 2016-11-29 DIAGNOSIS — I130 Hypertensive heart and chronic kidney disease with heart failure and stage 1 through stage 4 chronic kidney disease, or unspecified chronic kidney disease: Secondary | ICD-10-CM | POA: Diagnosis not present

## 2016-11-29 DIAGNOSIS — I5022 Chronic systolic (congestive) heart failure: Secondary | ICD-10-CM | POA: Diagnosis not present

## 2016-11-29 DIAGNOSIS — I87321 Chronic venous hypertension (idiopathic) with inflammation of right lower extremity: Secondary | ICD-10-CM | POA: Diagnosis not present

## 2016-11-29 DIAGNOSIS — L97322 Non-pressure chronic ulcer of left ankle with fat layer exposed: Secondary | ICD-10-CM | POA: Diagnosis not present

## 2016-12-03 ENCOUNTER — Encounter (HOSPITAL_COMMUNITY): Payer: Medicare Other

## 2016-12-03 ENCOUNTER — Inpatient Hospital Stay (HOSPITAL_COMMUNITY): Admission: RE | Admit: 2016-12-03 | Payer: Medicare Other | Source: Ambulatory Visit

## 2016-12-03 DIAGNOSIS — I130 Hypertensive heart and chronic kidney disease with heart failure and stage 1 through stage 4 chronic kidney disease, or unspecified chronic kidney disease: Secondary | ICD-10-CM | POA: Diagnosis not present

## 2016-12-03 DIAGNOSIS — I70202 Unspecified atherosclerosis of native arteries of extremities, left leg: Secondary | ICD-10-CM | POA: Diagnosis not present

## 2016-12-03 DIAGNOSIS — I87321 Chronic venous hypertension (idiopathic) with inflammation of right lower extremity: Secondary | ICD-10-CM | POA: Diagnosis not present

## 2016-12-03 DIAGNOSIS — I87312 Chronic venous hypertension (idiopathic) with ulcer of left lower extremity: Secondary | ICD-10-CM | POA: Diagnosis not present

## 2016-12-03 DIAGNOSIS — L97322 Non-pressure chronic ulcer of left ankle with fat layer exposed: Secondary | ICD-10-CM | POA: Diagnosis not present

## 2016-12-03 DIAGNOSIS — I5022 Chronic systolic (congestive) heart failure: Secondary | ICD-10-CM | POA: Diagnosis not present

## 2016-12-04 DIAGNOSIS — Z8719 Personal history of other diseases of the digestive system: Secondary | ICD-10-CM | POA: Diagnosis not present

## 2016-12-04 DIAGNOSIS — I2510 Atherosclerotic heart disease of native coronary artery without angina pectoris: Secondary | ICD-10-CM | POA: Diagnosis not present

## 2016-12-04 DIAGNOSIS — Z9181 History of falling: Secondary | ICD-10-CM | POA: Diagnosis not present

## 2016-12-04 DIAGNOSIS — Z8659 Personal history of other mental and behavioral disorders: Secondary | ICD-10-CM | POA: Diagnosis not present

## 2016-12-04 DIAGNOSIS — Z8669 Personal history of other diseases of the nervous system and sense organs: Secondary | ICD-10-CM | POA: Diagnosis not present

## 2016-12-04 DIAGNOSIS — G309 Alzheimer's disease, unspecified: Secondary | ICD-10-CM | POA: Diagnosis not present

## 2016-12-04 DIAGNOSIS — Z862 Personal history of diseases of the blood and blood-forming organs and certain disorders involving the immune mechanism: Secondary | ICD-10-CM | POA: Diagnosis not present

## 2016-12-04 DIAGNOSIS — I739 Peripheral vascular disease, unspecified: Secondary | ICD-10-CM | POA: Diagnosis not present

## 2016-12-04 DIAGNOSIS — I4891 Unspecified atrial fibrillation: Secondary | ICD-10-CM | POA: Diagnosis not present

## 2016-12-04 DIAGNOSIS — I509 Heart failure, unspecified: Secondary | ICD-10-CM | POA: Diagnosis not present

## 2016-12-05 DIAGNOSIS — I4891 Unspecified atrial fibrillation: Secondary | ICD-10-CM | POA: Diagnosis not present

## 2016-12-05 DIAGNOSIS — I509 Heart failure, unspecified: Secondary | ICD-10-CM | POA: Diagnosis not present

## 2016-12-05 DIAGNOSIS — Z8659 Personal history of other mental and behavioral disorders: Secondary | ICD-10-CM | POA: Diagnosis not present

## 2016-12-05 DIAGNOSIS — I2510 Atherosclerotic heart disease of native coronary artery without angina pectoris: Secondary | ICD-10-CM | POA: Diagnosis not present

## 2016-12-05 DIAGNOSIS — I739 Peripheral vascular disease, unspecified: Secondary | ICD-10-CM | POA: Diagnosis not present

## 2016-12-05 DIAGNOSIS — G309 Alzheimer's disease, unspecified: Secondary | ICD-10-CM | POA: Diagnosis not present

## 2016-12-22 DEATH — deceased

## 2017-03-08 IMAGING — CR DG CHEST 2V
2 series · 2 of 2 positions shown · non-contrast
Comparison: 12/03/2014

CLINICAL DATA: Dyspnea, hypertension, coronary artery disease post
MI and stenting, former smoker

EXAM:
CHEST  2 VIEW

[view not recorded (1 of 2)]
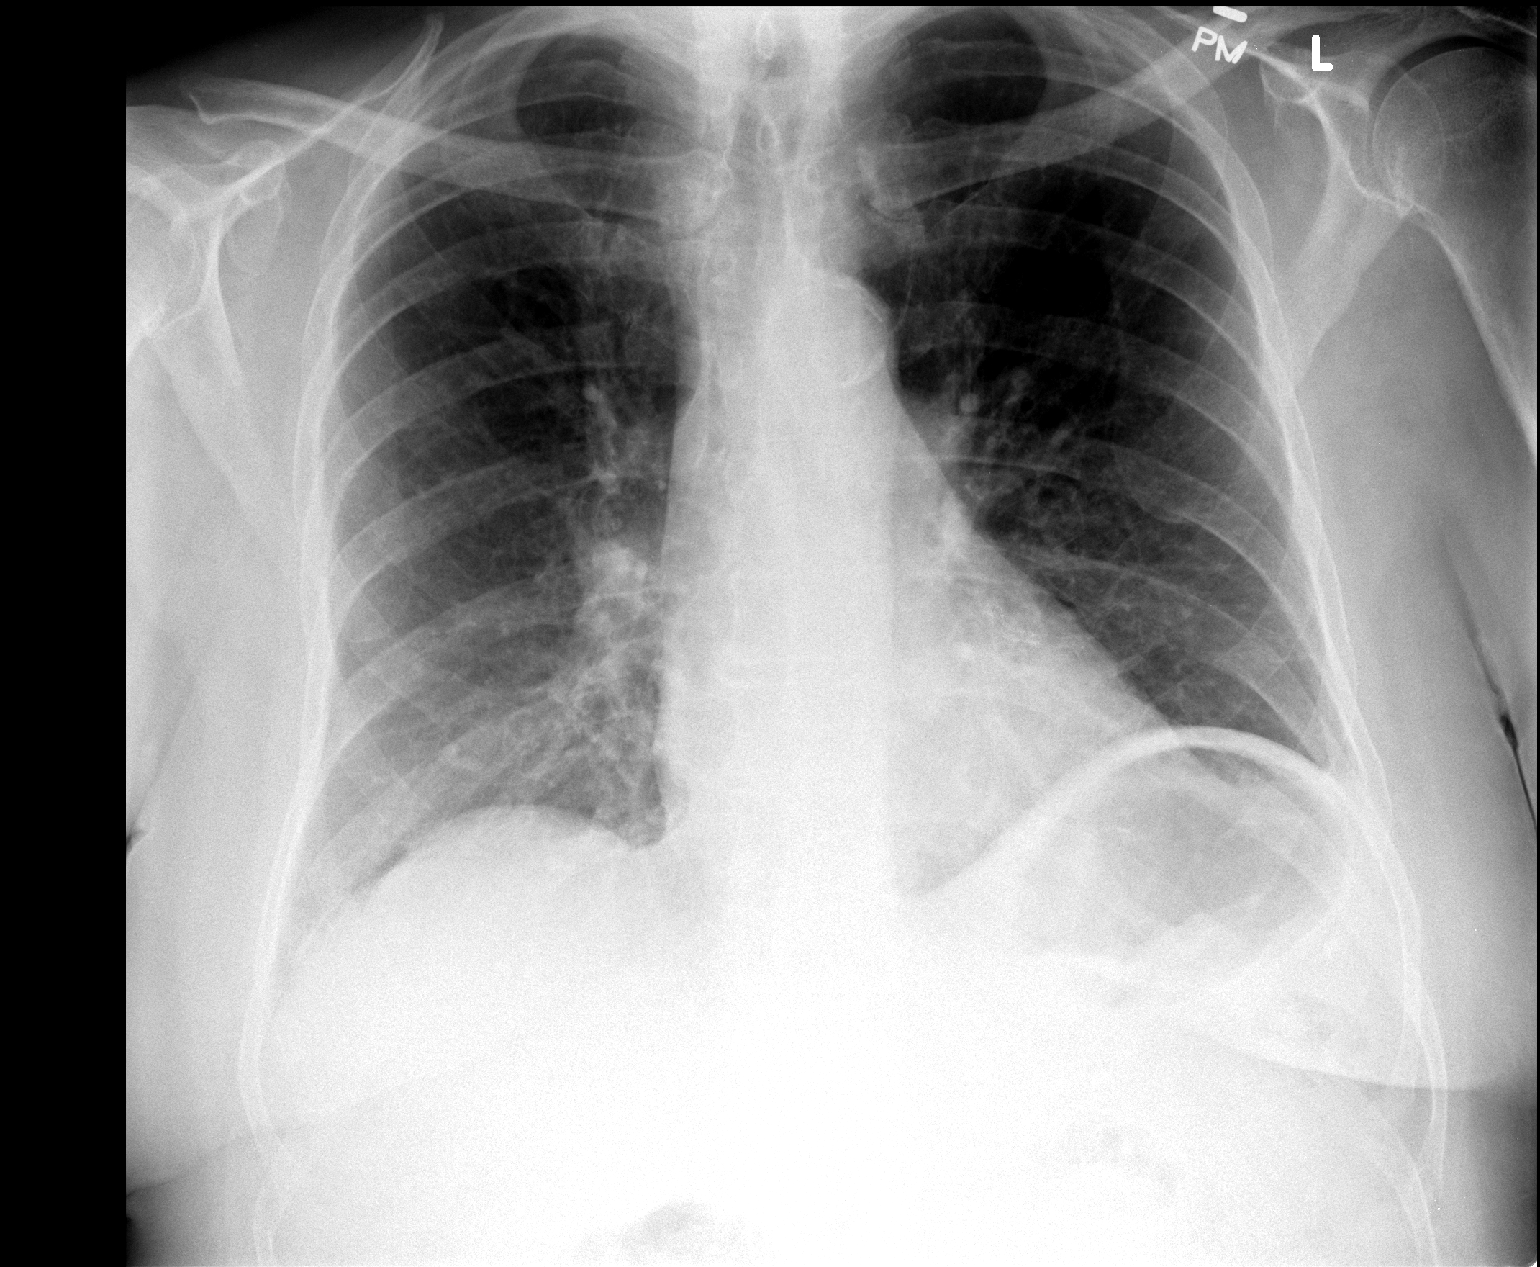

[view not recorded (2 of 2)]
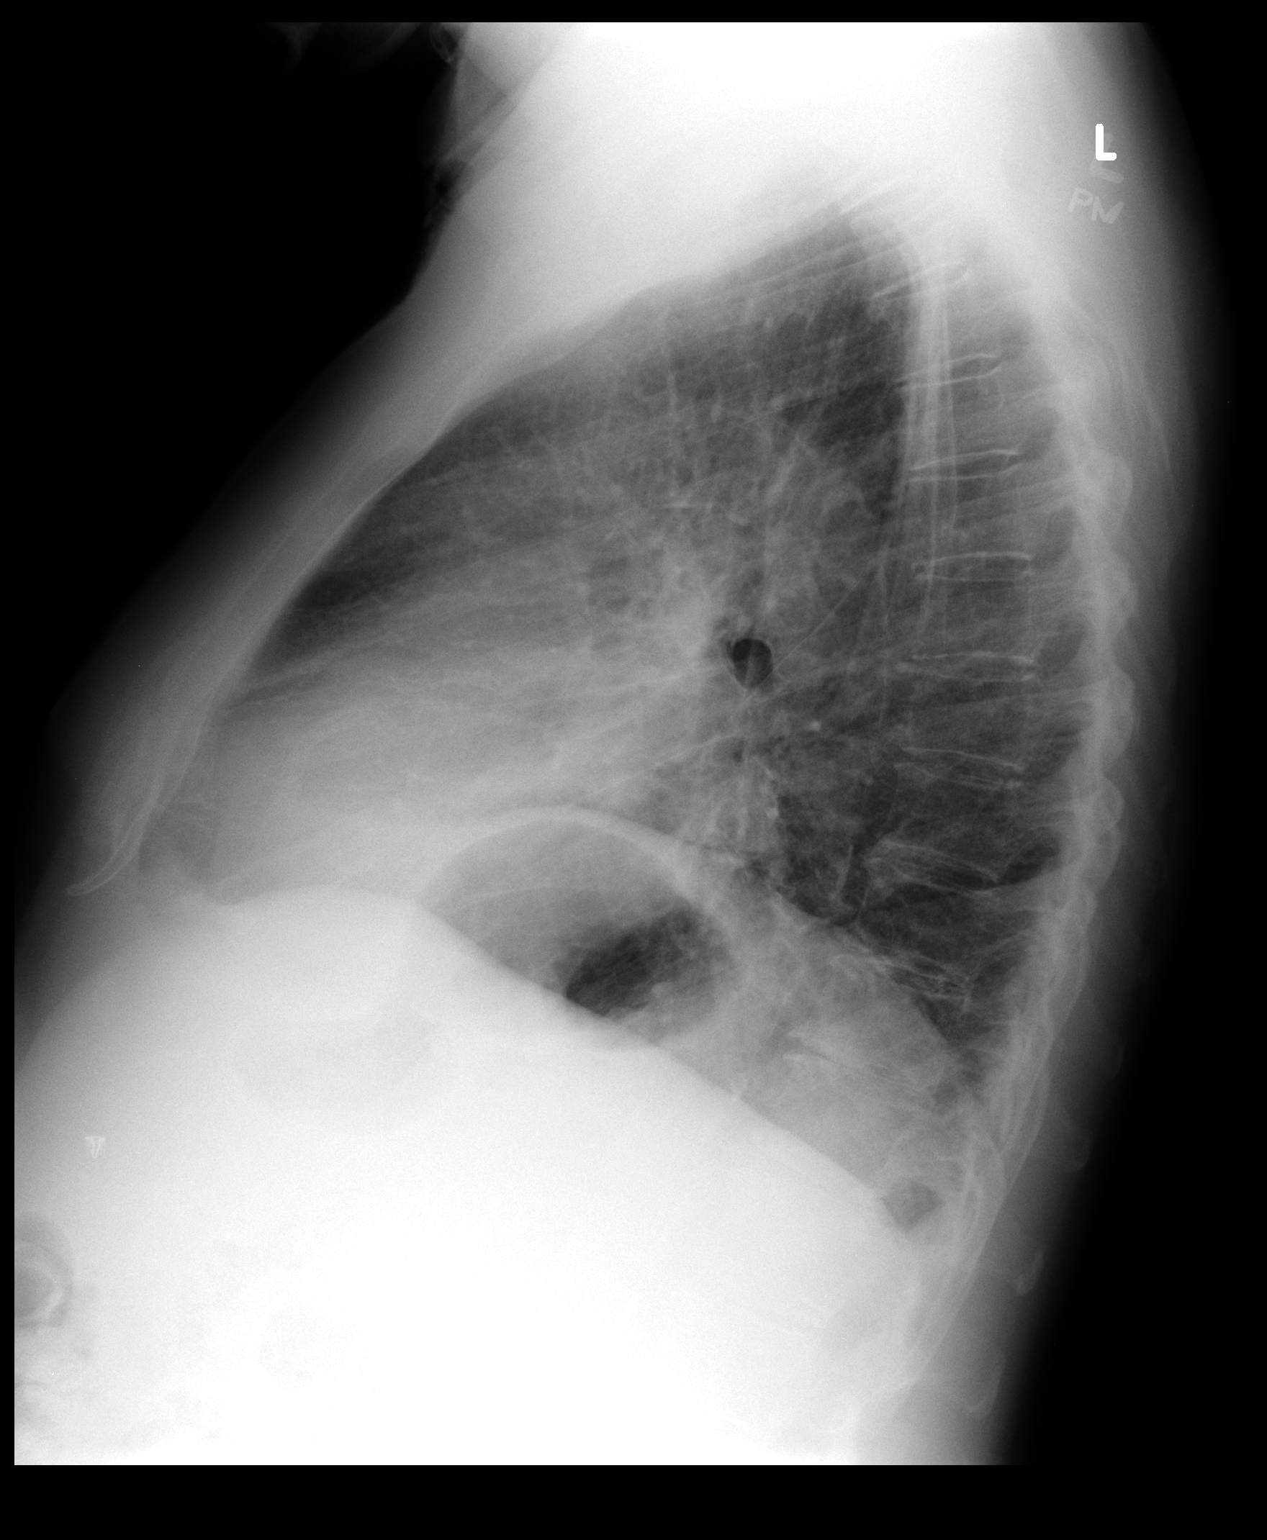

[2 of 2 positions shown; findings below may reference images not displayed]

FINDINGS: Normal heart size, mediastinal contours, and pulmonary vascularity.

Atherosclerotic calcification aorta.

Emphysematous changes with chronic eventration of LEFT diaphragm.

No acute infiltrate, pleural effusion or pneumothorax.

Tip of lung apex partially excluded.

No acute osseous findings.
IMPRESSION: Mild chronic elevation of LEFT diaphragm.

Emphysematous changes without acute infiltrate.
